# Patient Record
Sex: Female | Born: 1960 | Race: White | Hispanic: No | Marital: Married | Smoking: Former smoker
Health system: Southern US, Community
[De-identification: ages and names within clinical notes are randomized; demographics above are authoritative.]

## PROBLEM LIST (undated history)

## (undated) DIAGNOSIS — D509 Iron deficiency anemia, unspecified: Secondary | ICD-10-CM

## (undated) HISTORY — PX: CHOLECYSTECTOMY: SHX55

## (undated) HISTORY — PX: OTHER SURGICAL HISTORY: SHX169

## (undated) HISTORY — DX: Iron deficiency anemia, unspecified: D50.9

## (undated) HISTORY — PX: TOTAL SHOULDER ARTHROPLASTY: SHX126

---

## 2015-01-24 DIAGNOSIS — D509 Iron deficiency anemia, unspecified: Secondary | ICD-10-CM

## 2015-03-07 DIAGNOSIS — E042 Nontoxic multinodular goiter: Secondary | ICD-10-CM

## 2015-03-07 HISTORY — DX: Nontoxic multinodular goiter: E04.2

## 2015-05-02 DIAGNOSIS — E049 Nontoxic goiter, unspecified: Secondary | ICD-10-CM | POA: Insufficient documentation

## 2015-05-02 HISTORY — DX: Nontoxic goiter, unspecified: E04.9

## 2015-07-04 DIAGNOSIS — D509 Iron deficiency anemia, unspecified: Secondary | ICD-10-CM | POA: Diagnosis not present

## 2016-01-27 DIAGNOSIS — D509 Iron deficiency anemia, unspecified: Secondary | ICD-10-CM | POA: Diagnosis not present

## 2016-07-27 DIAGNOSIS — I10 Essential (primary) hypertension: Secondary | ICD-10-CM | POA: Diagnosis not present

## 2016-07-27 DIAGNOSIS — Z862 Personal history of diseases of the blood and blood-forming organs and certain disorders involving the immune mechanism: Secondary | ICD-10-CM | POA: Diagnosis not present

## 2018-01-04 ENCOUNTER — Encounter: Payer: Self-pay | Admitting: Gastroenterology

## 2019-04-26 DIAGNOSIS — D509 Iron deficiency anemia, unspecified: Secondary | ICD-10-CM

## 2019-05-08 DIAGNOSIS — E119 Type 2 diabetes mellitus without complications: Secondary | ICD-10-CM

## 2019-05-08 HISTORY — DX: Type 2 diabetes mellitus without complications: E11.9

## 2019-07-25 DIAGNOSIS — D509 Iron deficiency anemia, unspecified: Secondary | ICD-10-CM | POA: Diagnosis not present

## 2019-07-27 DIAGNOSIS — Z6828 Body mass index (BMI) 28.0-28.9, adult: Secondary | ICD-10-CM | POA: Diagnosis not present

## 2019-07-27 DIAGNOSIS — E559 Vitamin D deficiency, unspecified: Secondary | ICD-10-CM | POA: Diagnosis not present

## 2019-07-27 DIAGNOSIS — E039 Hypothyroidism, unspecified: Secondary | ICD-10-CM | POA: Diagnosis not present

## 2019-07-27 DIAGNOSIS — D509 Iron deficiency anemia, unspecified: Secondary | ICD-10-CM | POA: Diagnosis not present

## 2019-07-27 DIAGNOSIS — Z79899 Other long term (current) drug therapy: Secondary | ICD-10-CM | POA: Diagnosis not present

## 2019-07-27 DIAGNOSIS — E1169 Type 2 diabetes mellitus with other specified complication: Secondary | ICD-10-CM | POA: Diagnosis not present

## 2019-07-27 DIAGNOSIS — E785 Hyperlipidemia, unspecified: Secondary | ICD-10-CM | POA: Diagnosis not present

## 2019-08-21 DIAGNOSIS — Z79899 Other long term (current) drug therapy: Secondary | ICD-10-CM | POA: Diagnosis not present

## 2019-08-21 DIAGNOSIS — M25572 Pain in left ankle and joints of left foot: Secondary | ICD-10-CM | POA: Diagnosis not present

## 2019-08-21 DIAGNOSIS — D649 Anemia, unspecified: Secondary | ICD-10-CM | POA: Diagnosis not present

## 2019-08-21 DIAGNOSIS — M25571 Pain in right ankle and joints of right foot: Secondary | ICD-10-CM | POA: Diagnosis not present

## 2019-08-21 DIAGNOSIS — M0579 Rheumatoid arthritis with rheumatoid factor of multiple sites without organ or systems involvement: Secondary | ICD-10-CM | POA: Diagnosis not present

## 2019-09-14 DIAGNOSIS — M79672 Pain in left foot: Secondary | ICD-10-CM | POA: Diagnosis not present

## 2019-09-14 DIAGNOSIS — E119 Type 2 diabetes mellitus without complications: Secondary | ICD-10-CM | POA: Diagnosis not present

## 2019-09-14 DIAGNOSIS — M25571 Pain in right ankle and joints of right foot: Secondary | ICD-10-CM | POA: Diagnosis not present

## 2019-09-14 DIAGNOSIS — M25572 Pain in left ankle and joints of left foot: Secondary | ICD-10-CM | POA: Diagnosis not present

## 2019-09-14 DIAGNOSIS — M79641 Pain in right hand: Secondary | ICD-10-CM | POA: Diagnosis not present

## 2019-09-14 DIAGNOSIS — M79642 Pain in left hand: Secondary | ICD-10-CM | POA: Diagnosis not present

## 2019-09-14 DIAGNOSIS — M79671 Pain in right foot: Secondary | ICD-10-CM | POA: Diagnosis not present

## 2019-10-12 DIAGNOSIS — Z79899 Other long term (current) drug therapy: Secondary | ICD-10-CM | POA: Diagnosis not present

## 2019-10-12 DIAGNOSIS — M069 Rheumatoid arthritis, unspecified: Secondary | ICD-10-CM | POA: Diagnosis not present

## 2019-10-12 DIAGNOSIS — E785 Hyperlipidemia, unspecified: Secondary | ICD-10-CM | POA: Diagnosis not present

## 2019-10-23 DIAGNOSIS — M0579 Rheumatoid arthritis with rheumatoid factor of multiple sites without organ or systems involvement: Secondary | ICD-10-CM | POA: Diagnosis not present

## 2019-10-23 DIAGNOSIS — M25572 Pain in left ankle and joints of left foot: Secondary | ICD-10-CM | POA: Diagnosis not present

## 2019-10-23 DIAGNOSIS — Z79899 Other long term (current) drug therapy: Secondary | ICD-10-CM | POA: Diagnosis not present

## 2019-10-23 DIAGNOSIS — D649 Anemia, unspecified: Secondary | ICD-10-CM | POA: Diagnosis not present

## 2019-10-23 DIAGNOSIS — M25571 Pain in right ankle and joints of right foot: Secondary | ICD-10-CM | POA: Diagnosis not present

## 2019-10-23 DIAGNOSIS — M25752 Osteophyte, left hip: Secondary | ICD-10-CM | POA: Diagnosis not present

## 2019-10-28 DIAGNOSIS — F419 Anxiety disorder, unspecified: Secondary | ICD-10-CM

## 2019-10-28 DIAGNOSIS — F32A Depression, unspecified: Secondary | ICD-10-CM

## 2019-10-28 DIAGNOSIS — F418 Other specified anxiety disorders: Secondary | ICD-10-CM | POA: Insufficient documentation

## 2019-10-28 DIAGNOSIS — F329 Major depressive disorder, single episode, unspecified: Secondary | ICD-10-CM

## 2019-10-28 HISTORY — DX: Depression, unspecified: F32.A

## 2019-10-28 HISTORY — DX: Anxiety disorder, unspecified: F41.9

## 2019-10-28 HISTORY — DX: Major depressive disorder, single episode, unspecified: F32.9

## 2020-01-04 DIAGNOSIS — Z6827 Body mass index (BMI) 27.0-27.9, adult: Secondary | ICD-10-CM | POA: Diagnosis not present

## 2020-01-04 DIAGNOSIS — Z23 Encounter for immunization: Secondary | ICD-10-CM | POA: Diagnosis not present

## 2020-01-04 DIAGNOSIS — E785 Hyperlipidemia, unspecified: Secondary | ICD-10-CM | POA: Diagnosis not present

## 2020-01-04 DIAGNOSIS — E1169 Type 2 diabetes mellitus with other specified complication: Secondary | ICD-10-CM | POA: Diagnosis not present

## 2020-01-04 DIAGNOSIS — E039 Hypothyroidism, unspecified: Secondary | ICD-10-CM | POA: Diagnosis not present

## 2020-01-04 DIAGNOSIS — Z79899 Other long term (current) drug therapy: Secondary | ICD-10-CM | POA: Diagnosis not present

## 2020-01-04 DIAGNOSIS — F324 Major depressive disorder, single episode, in partial remission: Secondary | ICD-10-CM | POA: Diagnosis not present

## 2020-01-04 DIAGNOSIS — Z0000 Encounter for general adult medical examination without abnormal findings: Secondary | ICD-10-CM | POA: Diagnosis not present

## 2020-01-04 DIAGNOSIS — E669 Obesity, unspecified: Secondary | ICD-10-CM | POA: Diagnosis not present

## 2020-01-04 DIAGNOSIS — L4050 Arthropathic psoriasis, unspecified: Secondary | ICD-10-CM | POA: Diagnosis not present

## 2020-01-05 DIAGNOSIS — M0579 Rheumatoid arthritis with rheumatoid factor of multiple sites without organ or systems involvement: Secondary | ICD-10-CM | POA: Diagnosis not present

## 2020-01-05 DIAGNOSIS — M25572 Pain in left ankle and joints of left foot: Secondary | ICD-10-CM | POA: Diagnosis not present

## 2020-01-05 DIAGNOSIS — Z6826 Body mass index (BMI) 26.0-26.9, adult: Secondary | ICD-10-CM | POA: Diagnosis not present

## 2020-01-05 DIAGNOSIS — E663 Overweight: Secondary | ICD-10-CM | POA: Diagnosis not present

## 2020-01-05 DIAGNOSIS — M150 Primary generalized (osteo)arthritis: Secondary | ICD-10-CM | POA: Diagnosis not present

## 2020-01-24 ENCOUNTER — Other Ambulatory Visit: Payer: Self-pay | Admitting: Oncology

## 2020-01-24 ENCOUNTER — Other Ambulatory Visit: Payer: Self-pay | Admitting: Hematology and Oncology

## 2020-01-24 ENCOUNTER — Telehealth: Payer: Self-pay | Admitting: Hematology and Oncology

## 2020-01-24 DIAGNOSIS — D509 Iron deficiency anemia, unspecified: Secondary | ICD-10-CM | POA: Insufficient documentation

## 2020-01-24 HISTORY — DX: Iron deficiency anemia, unspecified: D50.9

## 2020-01-24 LAB — CBC AND DIFFERENTIAL
HCT: 29 — AB (ref 36–46)
Hemoglobin: 9.4 — AB (ref 12.0–16.0)
Platelets: 530 — AB (ref 150–399)
WBC: 13.1

## 2020-01-24 LAB — BASIC METABOLIC PANEL
BUN: 7 (ref 4–21)
CO2: 28 — AB (ref 13–22)
Chloride: 101 (ref 99–108)
Creatinine: 0.5 (ref 0.5–1.1)
Glucose: 183
Potassium: 3.3 — AB (ref 3.4–5.3)
Sodium: 141 (ref 137–147)

## 2020-01-24 LAB — IRON,TIBC AND FERRITIN PANEL
%SAT: 11.3
Ferritin: 32.5
Iron: 35
TIBC: 308

## 2020-01-24 LAB — COMPREHENSIVE METABOLIC PANEL: Calcium: 9.5 (ref 8.7–10.7)

## 2020-01-24 LAB — CBC: RBC: 3.25 — AB (ref 3.87–5.11)

## 2020-01-24 NOTE — Telephone Encounter (Signed)
Scheduled iron infusion per secure chat. Pt confirmed appt date and time.

## 2020-01-29 ENCOUNTER — Other Ambulatory Visit: Payer: Self-pay

## 2020-01-29 ENCOUNTER — Inpatient Hospital Stay: Payer: Medicare PPO | Attending: Oncology

## 2020-01-29 VITALS — BP 122/60 | HR 100 | Temp 98.2°F | Resp 18 | Ht 60.0 in | Wt 128.2 lb

## 2020-01-29 DIAGNOSIS — D509 Iron deficiency anemia, unspecified: Secondary | ICD-10-CM | POA: Diagnosis not present

## 2020-01-29 MED ORDER — SODIUM CHLORIDE 0.9 % IV SOLN
Freq: Once | INTRAVENOUS | Status: AC
  Filled 2020-01-29: qty 250

## 2020-01-29 MED ORDER — SODIUM CHLORIDE 0.9 % IV SOLN
510.0000 mg | Freq: Once | INTRAVENOUS | Status: AC
  Administered 2020-01-29: 510 mg via INTRAVENOUS
  Filled 2020-01-29: qty 510

## 2020-01-29 NOTE — Patient Instructions (Signed)

## 2020-01-29 NOTE — Progress Notes (Signed)
Pt stable at time of discharge. 

## 2020-02-02 ENCOUNTER — Other Ambulatory Visit: Payer: Self-pay

## 2020-02-02 ENCOUNTER — Inpatient Hospital Stay: Payer: Medicare PPO

## 2020-02-02 VITALS — BP 124/83 | HR 110 | Temp 98.4°F | Resp 18

## 2020-02-02 DIAGNOSIS — D509 Iron deficiency anemia, unspecified: Secondary | ICD-10-CM | POA: Diagnosis not present

## 2020-02-02 MED ORDER — SODIUM CHLORIDE 0.9 % IV SOLN
510.0000 mg | Freq: Once | INTRAVENOUS | Status: AC
  Administered 2020-02-02: 510 mg via INTRAVENOUS
  Filled 2020-02-02: qty 510

## 2020-02-02 MED ORDER — SODIUM CHLORIDE 0.9 % IV SOLN
Freq: Once | INTRAVENOUS | Status: AC
  Filled 2020-02-02: qty 250

## 2020-02-02 MED ORDER — SODIUM CHLORIDE 0.9% FLUSH
10.0000 mL | Freq: Once | INTRAVENOUS | Status: DC | PRN
  Filled 2020-02-02: qty 10

## 2020-02-02 NOTE — Patient Instructions (Signed)

## 2020-02-02 NOTE — Progress Notes (Signed)
PT STABLE AT TIME OF DISCHARGE 

## 2020-02-05 ENCOUNTER — Ambulatory Visit: Payer: Medicare PPO

## 2020-02-09 ENCOUNTER — Other Ambulatory Visit: Payer: Self-pay

## 2020-02-09 DIAGNOSIS — I313 Pericardial effusion (noninflammatory): Secondary | ICD-10-CM

## 2020-02-09 DIAGNOSIS — D509 Iron deficiency anemia, unspecified: Secondary | ICD-10-CM | POA: Insufficient documentation

## 2020-02-09 DIAGNOSIS — M818 Other osteoporosis without current pathological fracture: Secondary | ICD-10-CM

## 2020-02-09 DIAGNOSIS — E559 Vitamin D deficiency, unspecified: Secondary | ICD-10-CM

## 2020-02-09 DIAGNOSIS — E039 Hypothyroidism, unspecified: Secondary | ICD-10-CM | POA: Insufficient documentation

## 2020-02-09 DIAGNOSIS — L4050 Arthropathic psoriasis, unspecified: Secondary | ICD-10-CM | POA: Diagnosis not present

## 2020-02-09 DIAGNOSIS — F324 Major depressive disorder, single episode, in partial remission: Secondary | ICD-10-CM | POA: Diagnosis not present

## 2020-02-09 DIAGNOSIS — I3139 Other pericardial effusion (noninflammatory): Secondary | ICD-10-CM

## 2020-02-09 DIAGNOSIS — D649 Anemia, unspecified: Secondary | ICD-10-CM | POA: Insufficient documentation

## 2020-02-09 DIAGNOSIS — Z6825 Body mass index (BMI) 25.0-25.9, adult: Secondary | ICD-10-CM | POA: Diagnosis not present

## 2020-02-09 DIAGNOSIS — M6281 Muscle weakness (generalized): Secondary | ICD-10-CM | POA: Diagnosis not present

## 2020-02-09 DIAGNOSIS — R296 Repeated falls: Secondary | ICD-10-CM | POA: Diagnosis not present

## 2020-02-09 DIAGNOSIS — M059 Rheumatoid arthritis with rheumatoid factor, unspecified: Secondary | ICD-10-CM

## 2020-02-09 HISTORY — DX: Other pericardial effusion (noninflammatory): I31.39

## 2020-02-09 HISTORY — DX: Other osteoporosis without current pathological fracture: M81.8

## 2020-02-09 HISTORY — DX: Vitamin D deficiency, unspecified: E55.9

## 2020-02-09 HISTORY — DX: Pericardial effusion (noninflammatory): I31.3

## 2020-02-09 HISTORY — DX: Anemia, unspecified: D64.9

## 2020-02-09 HISTORY — DX: Rheumatoid arthritis with rheumatoid factor, unspecified: M05.9

## 2020-02-09 HISTORY — DX: Hypothyroidism, unspecified: E03.9

## 2020-02-12 ENCOUNTER — Encounter: Payer: Self-pay | Admitting: Cardiology

## 2020-02-12 ENCOUNTER — Ambulatory Visit: Payer: Medicare PPO | Admitting: Cardiology

## 2020-02-12 ENCOUNTER — Other Ambulatory Visit: Payer: Self-pay

## 2020-02-12 ENCOUNTER — Ambulatory Visit (INDEPENDENT_AMBULATORY_CARE_PROVIDER_SITE_OTHER): Payer: Medicare PPO

## 2020-02-12 VITALS — BP 110/75 | HR 100 | Ht 60.0 in | Wt 125.0 lb

## 2020-02-12 DIAGNOSIS — I10 Essential (primary) hypertension: Secondary | ICD-10-CM | POA: Diagnosis not present

## 2020-02-12 DIAGNOSIS — I38 Endocarditis, valve unspecified: Secondary | ICD-10-CM

## 2020-02-12 DIAGNOSIS — I491 Atrial premature depolarization: Secondary | ICD-10-CM

## 2020-02-12 DIAGNOSIS — R42 Dizziness and giddiness: Secondary | ICD-10-CM

## 2020-02-12 DIAGNOSIS — E1100 Type 2 diabetes mellitus with hyperosmolarity without nonketotic hyperglycemic-hyperosmolar coma (NKHHC): Secondary | ICD-10-CM

## 2020-02-12 DIAGNOSIS — M059 Rheumatoid arthritis with rheumatoid factor, unspecified: Secondary | ICD-10-CM | POA: Diagnosis not present

## 2020-02-12 DIAGNOSIS — I447 Left bundle-branch block, unspecified: Secondary | ICD-10-CM | POA: Diagnosis not present

## 2020-02-12 DIAGNOSIS — R0600 Dyspnea, unspecified: Secondary | ICD-10-CM

## 2020-02-12 DIAGNOSIS — R0609 Other forms of dyspnea: Secondary | ICD-10-CM

## 2020-02-12 NOTE — Patient Instructions (Signed)
Medication Instructions:  No medication changes. *If you need a refill on your cardiac medications before your next appointment, please call your pharmacy*   Lab Work: None ordered If you have labs (blood work) drawn today and your tests are completely normal, you will receive your results only by: Marland Kitchen MyChart Message (if you have MyChart) OR . A paper copy in the mail If you have any lab test that is abnormal or we need to change your treatment, we will call you to review the results.   Testing/Procedures: Your physician has requested that you have an echocardiogram. Echocardiography is a painless test that uses sound waves to create images of your heart. It provides your doctor with information about the size and shape of your heart and how well your heart's chambers and valves are working. This procedure takes approximately one hour. There are no restrictions for this procedure.   WHY IS MY DOCTOR PRESCRIBING ZIO? The Zio system is proven and trusted by physicians to detect and diagnose irregular heart rhythms -- and has been prescribed to hundreds of thousands of patients.  The FDA has cleared the Zio system to monitor for many different kinds of irregular heart rhythms. In a study, physicians were able to reach a diagnosis 90% of the time with the Zio system1.  You can wear the Zio monitor -- a small, discreet, comfortable patch -- during your normal day-to-day activity, including while you sleep, shower, and exercise, while it records every single heartbeat for analysis.  1Barrett, P., et al. Comparison of 24 Hour Holter Monitoring Versus 14 Day Novel Adhesive Patch Electrocardiographic Monitoring. American Journal of Medicine, 2014.  ZIO VS. HOLTER MONITORING The Zio monitor can be comfortably worn for up to 14 days. Holter monitors can be worn for 24 to 48 hours, limiting the time to record any irregular heart rhythms you may have. Zio is able to capture data for the 51% of patients  who have their first symptom-triggered arrhythmia after 48 hours.1  LIVE WITHOUT RESTRICTIONS The Zio ambulatory cardiac monitor is a small, unobtrusive, and water-resistant patch--you might even forget you're wearing it. The Zio monitor records and stores every beat of your heart, whether you're sleeping, working out, or showering.  Wear the monitor for 1 week, remove 02/19/20.   Follow-Up: At [facility], you and your health needs are our priority.  As part of our continuing mission to provide you with exceptional heart care, we have created designated Provider Care Teams.  These Care Teams include your primary Cardiologist (physician) and Advanced Practice Providers (APPs -  Physician Assistants and Nurse Practitioners) who all work together to provide you with the care you need, when you need it.  We recommend signing up for the patient portal called "MyChart".  Sign up information is provided on this After Visit Summary.  MyChart is used to connect with patients for Virtual Visits (Telemedicine).  Patients are able to view lab/test results, encounter notes, upcoming appointments, etc.  Non-urgent messages can be sent to your provider as well.   To learn more about what you can do with MyChart, go to [email or web address].    Your next appointment:   8 week(s)  The format for your next appointment:   In Person  Provider:   Thomasene Ripple, DO   Other Instructions  Echocardiogram An echocardiogram is a procedure that uses painless sound waves (ultrasound) to produce an image of the heart. Images from an echocardiogram can provide important information about:  Signs of  coronary artery disease (CAD).  Aneurysm detection. An aneurysm is a weak or damaged part of an artery wall that bulges out from the normal force of blood pumping through the body.  Heart size and shape. Changes in the size or shape of the heart can be associated with certain conditions, including heart failure,  aneurysm, and CAD.  Heart muscle function.  Heart valve function.  Signs of a past heart attack.  Fluid buildup around the heart.  Thickening of the heart muscle.  A tumor or infectious growth around the heart valves. Tell a health care provider about:  Any allergies you have.  All medicines you are taking, including vitamins, herbs, eye drops, creams, and over-the-counter medicines.  Any blood disorders you have.  Any surgeries you have had.  Any medical conditions you have.  Whether you are pregnant or may be pregnant. What are the risks? Generally, this is a safe procedure. However, problems may occur, including:  Allergic reaction to dye (contrast) that may be used during the procedure. What happens before the procedure? No specific preparation is needed. You may eat and drink normally. What happens during the procedure?   An IV tube may be inserted into one of your veins.  You may receive contrast through this tube. A contrast is an injection that improves the quality of the pictures from your heart.  A gel will be applied to your chest.  A wand-like tool (transducer) will be moved over your chest. The gel will help to transmit the sound waves from the transducer.  The sound waves will harmlessly bounce off of your heart to allow the heart images to be captured in real-time motion. The images will be recorded on a computer. The procedure may vary among health care providers and hospitals. What happens after the procedure?  You may return to your normal, everyday life, including diet, activities, and medicines, unless your health care provider tells you not to do that. Summary  An echocardiogram is a procedure that uses painless sound waves (ultrasound) to produce an image of the heart.  Images from an echocardiogram can provide important information about the size and shape of your heart, heart muscle function, heart valve function, and fluid buildup around  your heart.  You do not need to do anything to prepare before this procedure. You may eat and drink normally.  After the echocardiogram is completed, you may return to your normal, everyday life, unless your health care provider tells you not to do that. This information is not intended to replace advice given to you by your health care provider. Make sure you discuss any questions you have with your health care provider. Document Revised: 07/07/2018 Document Reviewed: 04/18/2016 Elsevier Patient Education  [place].

## 2020-02-12 NOTE — Progress Notes (Signed)
Cardiology Office Note:    Date:  02/12/2020   ID:  Madison Hurst, DOB May 12, 1960, MRN [id number]  PCP:  Buckner Malta, MD  Cardiologist:  Thomasene Ripple, DO  Electrophysiologist:  None   Referring MD: Buckner Malta, MD   " I was asked see you by my pcp."  History of Present Illness:    Madison Hurst is a 59 y.o. female with a hx of hypertension, rheumatoid arthritis, iron deficiency anemia receiving iron therapy presents today to be evaluated at the request of her PCP. During our encounter the patient tells me that she has had some shortness of breath on exertion.  She notes that she avoid activity given the fact that she gets short of breath.  What most bothersome for the patient recently is the fact she has been experiencing lightheadedness and dizziness over the last several weeks.  She explained this as an abrupt onset of dizziness which occur at any time does not matter if she is standing or sitting.  It short-lived and then resolved itself.   Past Medical History:  Diagnosis Date  . Anemia 02/09/2020  . Anxiety 10/28/2019  . Depressive disorder 10/28/2019  . Hypothyroidism 02/09/2020  . Iron deficiency anemia 01/24/2020  . Iron deficiency anemia, unspecified   . Multinodular goiter 03/07/2015  . Pericardial effusion 02/09/2020  . 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 Type 2 diabetes mellitus (HCC) 05/08/2019  . Vitamin D deficiency 02/09/2020    Past Surgical History:  Procedure Laterality Date  . CESAREAN SECTION    . CHOLECYSTECTOMY    . other     Growth removal on chest  . TOTAL SHOULDER ARTHROPLASTY Left     Current Medications: Current Meds  Medication Sig  . Abatacept (ORENCIA [place]) Inject into the skin once a week.  . ARIPiprazole (ABILIFY) 10 MG tablet Take 10 mg by mouth daily.  Marland Kitchen buPROPion (WELLBUTRIN XL) 150 MG 24 hr tablet  Take 150 mg by mouth daily.  . ergocalciferol (VITAMIN D2) 1.25 MG (50000 UT) capsule Take 50,000 Units by mouth once a week.  . flurbiprofen (ANSAID) 100 MG tablet Take 100 mg by mouth 2 (two) times daily.  . folic acid (FOLVITE) 1 MG tablet Take 2 mg by mouth daily.  Marland Kitchen HYDROcodone-acetaminophen (NORCO/VICODIN) 5-325 MG tablet Take 1 tablet by mouth as needed.  Marland Kitchen levothyroxine (SYNTHROID) 75 MCG tablet Take 75 mcg by mouth daily before breakfast.  . metFORMIN (GLUMETZA) 500 MG (MOD) 24 hr tablet Take 500 mg by mouth daily with breakfast.  . Multiple Vitamin (MULTIVITAMIN) capsule Take 1 capsule by mouth daily.  Marland Kitchen ORENCIA CLICKJECT 125 MG/ML SOAJ   . predniSONE (DELTASONE) 10 MG tablet Take 10 mg by mouth daily with breakfast.  . telmisartan (MICARDIS) 20 MG tablet Take 20 mg by mouth daily.     Allergies:   Sulfa antibiotics and Sulfamethoxazole-trimethoprim   Social History   Socioeconomic History  . Marital status: Married    Spouse name: Not on file  . Number of children: Not on file  . Years of education: Not on file  . Highest education level: Not on file  Occupational History  . Not on file  Tobacco Use  . Smoking status: Former Smoker    Quit date: 02/11/1994    Years since quitting: 26.0  . Smokeless tobacco: Never Used  Vaping Use  . Vaping Use: Never used  Substance  and Sexual Activity  . Alcohol use: Not on file  . Drug use: Not on file  . Sexual activity: Not on file  Other Topics Concern  . Not on file  Social History Narrative  . Not on file   Social Determinants of Health   Financial Resource Strain:   . Difficulty of Paying Living Expenses: Not on file  Food Insecurity:   . Worried About Programme researcher, broadcasting/film/video in the Last Year: Not on file  . Ran Out of Food in the Last Year: Not on file  Transportation Needs:   . Lack of Transportation (Medical): Not on file  . Lack of Transportation (Non-Medical): Not on file  Physical Activity:   . Days of Exercise  per Week: Not on file  . Minutes of Exercise per Session: Not on file  Stress:   . Feeling of Stress : Not on file  Social Connections:   . Frequency of Communication with Friends and Family: Not on file  . Frequency of Social Gatherings with Friends and Family: Not on file  . Attends Religious Services: Not on file  . Active Member of Clubs or Organizations: Not on file  . Attends Banker Meetings: Not on file  . Marital Status: Not on file     Family History: The patient's family history includes Diabetes in her brother and father; Heart disease in her brother; Muscular dystrophy in her mother.  ROS:   Review of Systems  Constitution: Negative for decreased appetite, fever and weight gain.  HENT: Negative for congestion, ear discharge, hoarse voice and sore throat.   Eyes: Negative for discharge, redness, vision loss in right eye and visual halos.  Cardiovascular: Negative for chest pain, dyspnea on exertion, leg swelling, orthopnea and palpitations.  Respiratory: Negative for cough, hemoptysis, shortness of breath and snoring.   Endocrine: Negative for heat intolerance and polyphagia.  Hematologic/Lymphatic: Negative for bleeding problem. Does not bruise/bleed easily.  Skin: Negative for flushing, nail changes, rash and suspicious lesions.  Musculoskeletal: Negative for arthritis, joint pain, muscle cramps, myalgias, neck pain and stiffness.  Gastrointestinal: Negative for abdominal pain, bowel incontinence, diarrhea and excessive appetite.  Genitourinary: Negative for decreased libido, genital sores and incomplete emptying.  Neurological: Negative for brief paralysis, focal weakness, headaches and loss of balance.  Psychiatric/Behavioral: Negative for altered mental status, depression and suicidal ideas.  Allergic/Immunologic: Negative for HIV exposure and persistent infections.    EKGs/Labs/Other Studies Reviewed:    The following studies were reviewed  today:   EKG:  The ekg ordered today demonstrates sinus rhythm, heart rate 96 bpm with occasional PACs and left bundle branch block.  Recent Labs: 01/24/2020: BUN 7; Creatinine 0.5; Hemoglobin 9.4; Platelets 530; Potassium 3.3; Sodium 141  Recent Lipid Panel No results found for: CHOL, TRIG, HDL, CHOLHDL, VLDL, LDLCALC, LDLDIRECT  Physical Exam:    VS:  BP 110/75   Pulse 100   Ht 5' (1.524 m)   Wt 125 lb (56.7 kg)   SpO2 99%   BMI 24.41 kg/m     Wt Readings from Last 3 Encounters:  02/12/20 125 lb (56.7 kg)  01/30/20 131 lb 14.4 oz (59.8 kg)  01/29/20 128 lb 4 oz (58.2 kg)     GEN: Well nourished, well developed in no acute distress HEENT: Normal NECK: No JVD; No carotid bruits LYMPHATICS: No lymphadenopathy CARDIAC: S1S2 noted,RRR, 2 out of 6 mid ejection systolic murmurs, rubs, gallops RESPIRATORY:  Clear to auscultation without rales, wheezing or  rhonchi  ABDOMEN: Soft, non-tender, non-distended, +bowel sounds, no guarding. EXTREMITIES: No edema, No cyanosis, no clubbing MUSCULOSKELETAL:  No deformity  SKIN: Warm and dry NEUROLOGIC:  Alert and oriented x 3, non-focal PSYCHIATRIC:  Normal affect, good insight  ASSESSMENT:    1. Murmur, diastolic   2. Dizziness   3. Primary hypertension   4. Type 2 diabetes mellitus with hyperosmolarity without coma, without long-term current use of insulin (HCC)   5. Seropositive rheumatoid arthritis (HCC)   6. Dyspnea on exertion   7. Left bundle branch block   8. PAC (premature atrial contraction)    PLAN:     I would like to rule out a cardiovascular etiology of this dizziness, therefore at this time I would like to placed a zio patch for 7 days.  For her shortness of breath and murmur,a transthoracic echocardiogram will be ordered to assess LV/RV function and any structural abnormalities. Once these testing have been performed amd reviewed further reccomendations will be made.   She also does have risk factors for  coronary artery disease but will wait until her echocardiogram results to see how we can pursue an ischemic evaluation.  Diabetes mellitus is being managed by her primary care doctor.   For now, I do reccomend that the patient goes to the nearest ED if  symptoms recur.  The patient is in agreement with the above plan. The patient left the office in stable condition.  The patient will follow up in   Medication Adjustments/Labs and Tests Ordered: Current medicines are reviewed at length with the patient today.  Concerns regarding medicines are outlined above.  Orders Placed This Encounter  Procedures  . LONG TERM MONITOR (3-14 DAYS)  . EKG 12-Lead  . ECHOCARDIOGRAM COMPLETE   No orders of the defined types were placed in this encounter.   Patient Instructions  Medication Instructions:  No medication changes. *If you need a refill on your cardiac medications before your next appointment, please call your pharmacy*   Lab Work: None ordered If you have labs (blood work) drawn today and your tests are completely normal, you will receive your results only by: Marland Kitchen MyChart Message (if you have MyChart) OR . A paper copy in the mail If you have any lab test that is abnormal or we need to change your treatment, we will call you to review the results.   Testing/Procedures: Your physician has requested that you have an echocardiogram. Echocardiography is a painless test that uses sound waves to create images of your heart. It provides your doctor with information about the size and shape of your heart and how well your heart's chambers and valves are working. This procedure takes approximately one hour. There are no restrictions for this procedure.   WHY IS MY DOCTOR PRESCRIBING ZIO? The Zio system is proven and trusted by physicians to detect and diagnose irregular heart rhythms -- and has been prescribed to hundreds of thousands of patients.  The FDA has cleared the Zio system to monitor  for many different kinds of irregular heart rhythms. In a study, physicians were able to reach a diagnosis 90% of the time with the Zio system1.  You can wear the Zio monitor -- a small, discreet, comfortable patch -- during your normal day-to-day activity, including while you sleep, shower, and exercise, while it records every single heartbeat for analysis.  1Barrett, P., et al. Comparison of 24 Hour Holter Monitoring Versus 14 Day Novel Adhesive Patch Electrocardiographic Monitoring. American Journal of  Medicine, 2014.  ZIO VS. HOLTER MONITORING The Zio monitor can be comfortably worn for up to 14 days. Holter monitors can be worn for 24 to 48 hours, limiting the time to record any irregular heart rhythms you may have. Zio is able to capture data for the 51% of patients who have their first symptom-triggered arrhythmia after 48 hours.1  LIVE WITHOUT RESTRICTIONS The Zio ambulatory cardiac monitor is a small, unobtrusive, and water-resistant patch--you might even forget you're wearing it. The Zio monitor records and stores every beat of your heart, whether you're sleeping, working out, or showering.  Wear the monitor for 1 week, remove 02/19/20.   Follow-Up: At [facility], you and your health needs are our priority.  As part of our continuing mission to provide you with exceptional heart care, we have created designated Provider Care Teams.  These Care Teams include your primary Cardiologist (physician) and Advanced Practice Providers (APPs -  Physician Assistants and Nurse Practitioners) who all work together to provide you with the care you need, when you need it.  We recommend signing up for the patient portal called "MyChart".  Sign up information is provided on this After Visit Summary.  MyChart is used to connect with patients for Virtual Visits (Telemedicine).  Patients are able to view lab/test results, encounter notes, upcoming appointments, etc.  Non-urgent messages can be sent to  your provider as well.   To learn more about what you can do with MyChart, go to [email or web address].    Your next appointment:   8 week(s)  The format for your next appointment:   In Person  Provider:   Thomasene Ripple, DO   Other Instructions  Echocardiogram An echocardiogram is a procedure that uses painless sound waves (ultrasound) to produce an image of the heart. Images from an echocardiogram can provide important information about:  Signs of coronary artery disease (CAD).  Aneurysm detection. An aneurysm is a weak or damaged part of an artery wall that bulges out from the normal force of blood pumping through the body.  Heart size and shape. Changes in the size or shape of the heart can be associated with certain conditions, including heart failure, aneurysm, and CAD.  Heart muscle function.  Heart valve function.  Signs of a past heart attack.  Fluid buildup around the heart.  Thickening of the heart muscle.  A tumor or infectious growth around the heart valves. Tell a health care provider about:  Any allergies you have.  All medicines you are taking, including vitamins, herbs, eye drops, creams, and over-the-counter medicines.  Any blood disorders you have.  Any surgeries you have had.  Any medical conditions you have.  Whether you are pregnant or may be pregnant. What are the risks? Generally, this is a safe procedure. However, problems may occur, including:  Allergic reaction to dye (contrast) that may be used during the procedure. What happens before the procedure? No specific preparation is needed. You may eat and drink normally. What happens during the procedure?   An IV tube may be inserted into one of your veins.  You may receive contrast through this tube. A contrast is an injection that improves the quality of the pictures from your heart.  A gel will be applied to your chest.  A wand-like tool (transducer) will be moved over your  chest. The gel will help to transmit the sound waves from the transducer.  The sound waves will harmlessly bounce off of your heart to allow the  heart images to be captured in real-time motion. The images will be recorded on a computer. The procedure may vary among health care providers and hospitals. What happens after the procedure?  You may return to your normal, everyday life, including diet, activities, and medicines, unless your health care provider tells you not to do that. Summary  An echocardiogram is a procedure that uses painless sound waves (ultrasound) to produce an image of the heart.  Images from an echocardiogram can provide important information about the size and shape of your heart, heart muscle function, heart valve function, and fluid buildup around your heart.  You do not need to do anything to prepare before this procedure. You may eat and drink normally.  After the echocardiogram is completed, you may return to your normal, everyday life, unless your health care provider tells you not to do that. This information is not intended to replace advice given to you by your health care provider. Make sure you discuss any questions you have with your health care provider. Document Revised: 07/07/2018 Document Reviewed: 04/18/2016 Elsevier Patient Education  2020 [employer].      Adopting a Healthy Lifestyle.  Know what a healthy weight is for you (roughly BMI <25) and aim to maintain this   Aim for 7+ servings of fruits and vegetables daily   65-80+ fluid ounces of water or unsweet tea for healthy kidneys   Limit to max 1 drink of alcohol per day; avoid smoking/tobacco   Limit animal fats in diet for cholesterol and heart health - choose grass fed whenever available   Avoid highly processed foods, and foods high in saturated/trans fats   Aim for low stress - take time to unwind and care for your mental health   Aim for 150 min of moderate intensity exercise  weekly for heart health, and weights twice weekly for bone health   Aim for 7-9 hours of sleep daily   When it comes to diets, agreement about the perfect plan isnt easy to find, even among the experts. Experts at the [place] [employer] developed an idea known as the Healthy Eating Plate. Just imagine a plate divided into logical, healthy portions.   The emphasis is on diet quality:   Load up on vegetables and fruits - one-half of your plate: Aim for color and variety, and remember that potatoes dont count.   Go for whole grains - one-quarter of your plate: Whole wheat, barley, wheat berries, quinoa, oats, brown rice, and foods made with them. If you want pasta, go with whole wheat pasta.   Protein power - one-quarter of your plate: Fish, chicken, beans, and nuts are all healthy, versatile protein sources. Limit red meat.   The diet, however, does go beyond the plate, offering a few other suggestions.   Use healthy plant oils, such as olive, canola, soy, corn, sunflower and peanut. Check the labels, and avoid partially hydrogenated oil, which have unhealthy trans fats.   If youre thirsty, drink water. Coffee and tea are good in moderation, but skip sugary drinks and limit milk and dairy products to one or two daily servings.   The type of carbohydrate in the diet is more important than the amount. Some sources of carbohydrates, such as vegetables, fruits, whole grains, and beans-are healthier than others.   Finally, stay active  Signed, Thomasene Ripple, DO  02/12/2020 11:12 AM    [facility]

## 2020-02-16 DIAGNOSIS — G9389 Other specified disorders of brain: Secondary | ICD-10-CM | POA: Diagnosis not present

## 2020-02-16 DIAGNOSIS — I6529 Occlusion and stenosis of unspecified carotid artery: Secondary | ICD-10-CM | POA: Diagnosis not present

## 2020-02-16 DIAGNOSIS — R296 Repeated falls: Secondary | ICD-10-CM | POA: Diagnosis not present

## 2020-02-16 DIAGNOSIS — D649 Anemia, unspecified: Secondary | ICD-10-CM | POA: Diagnosis not present

## 2020-02-21 DIAGNOSIS — M545 Low back pain, unspecified: Secondary | ICD-10-CM | POA: Diagnosis not present

## 2020-02-21 DIAGNOSIS — M4804 Spinal stenosis, thoracic region: Secondary | ICD-10-CM | POA: Diagnosis not present

## 2020-02-21 DIAGNOSIS — M5124 Other intervertebral disc displacement, thoracic region: Secondary | ICD-10-CM | POA: Diagnosis not present

## 2020-02-21 DIAGNOSIS — M0579 Rheumatoid arthritis with rheumatoid factor of multiple sites without organ or systems involvement: Secondary | ICD-10-CM | POA: Diagnosis not present

## 2020-02-21 DIAGNOSIS — M6281 Muscle weakness (generalized): Secondary | ICD-10-CM | POA: Diagnosis not present

## 2020-02-26 DIAGNOSIS — M48061 Spinal stenosis, lumbar region without neurogenic claudication: Secondary | ICD-10-CM | POA: Diagnosis not present

## 2020-02-26 DIAGNOSIS — M5124 Other intervertebral disc displacement, thoracic region: Secondary | ICD-10-CM | POA: Diagnosis not present

## 2020-02-26 DIAGNOSIS — M4804 Spinal stenosis, thoracic region: Secondary | ICD-10-CM | POA: Diagnosis not present

## 2020-02-26 DIAGNOSIS — Z6825 Body mass index (BMI) 25.0-25.9, adult: Secondary | ICD-10-CM | POA: Diagnosis not present

## 2020-02-28 DIAGNOSIS — G319 Degenerative disease of nervous system, unspecified: Secondary | ICD-10-CM | POA: Diagnosis not present

## 2020-02-28 DIAGNOSIS — R296 Repeated falls: Secondary | ICD-10-CM | POA: Diagnosis not present

## 2020-02-28 DIAGNOSIS — H748X3 Other specified disorders of middle ear and mastoid, bilateral: Secondary | ICD-10-CM | POA: Diagnosis not present

## 2020-02-28 DIAGNOSIS — R9082 White matter disease, unspecified: Secondary | ICD-10-CM | POA: Diagnosis not present

## 2020-03-07 DIAGNOSIS — R42 Dizziness and giddiness: Secondary | ICD-10-CM | POA: Diagnosis not present

## 2020-03-08 ENCOUNTER — Telehealth: Payer: Self-pay

## 2020-03-08 NOTE — Telephone Encounter (Signed)
Left message on patients voicemail to please return our call.   

## 2020-03-08 NOTE — Telephone Encounter (Signed)
Spoke with patient regarding results and recommendation.  Patient verbalizes understanding and is agreeable to plan of care. Advised patient to call back with any issues or concerns.  

## 2020-03-08 NOTE — Telephone Encounter (Signed)
-----   Message from Madison Ripple, DO sent at 03/08/2020  4:16 PM EST ----- Have the patient see me next week so we can discuss her monitor results.

## 2020-03-11 ENCOUNTER — Ambulatory Visit (INDEPENDENT_AMBULATORY_CARE_PROVIDER_SITE_OTHER): Payer: Medicare PPO

## 2020-03-11 ENCOUNTER — Other Ambulatory Visit: Payer: Self-pay

## 2020-03-11 DIAGNOSIS — I38 Endocarditis, valve unspecified: Secondary | ICD-10-CM

## 2020-03-11 LAB — ECHOCARDIOGRAM COMPLETE
Area-P 1/2: 6.54 cm2
Calc EF: 37.2 %
S' Lateral: 3.5 cm
Single Plane A2C EF: 37.8 %
Single Plane A4C EF: 37.4 %

## 2020-03-11 NOTE — Progress Notes (Signed)
Complete echocardiogram performed.  Jimmy Virna Livengood RDCS, RVT  

## 2020-03-12 ENCOUNTER — Other Ambulatory Visit: Payer: Self-pay

## 2020-03-13 ENCOUNTER — Ambulatory Visit: Payer: Medicare PPO | Admitting: Cardiology

## 2020-03-13 ENCOUNTER — Other Ambulatory Visit: Payer: Self-pay

## 2020-03-13 ENCOUNTER — Encounter: Payer: Self-pay | Admitting: Cardiology

## 2020-03-13 VITALS — BP 118/68 | HR 88 | Ht 60.0 in | Wt 123.2 lb

## 2020-03-13 DIAGNOSIS — R0600 Dyspnea, unspecified: Secondary | ICD-10-CM

## 2020-03-13 DIAGNOSIS — I471 Supraventricular tachycardia: Secondary | ICD-10-CM | POA: Diagnosis not present

## 2020-03-13 DIAGNOSIS — I4719 Other supraventricular tachycardia: Secondary | ICD-10-CM

## 2020-03-13 DIAGNOSIS — E8881 Metabolic syndrome: Secondary | ICD-10-CM | POA: Diagnosis not present

## 2020-03-13 DIAGNOSIS — R931 Abnormal findings on diagnostic imaging of heart and coronary circulation: Secondary | ICD-10-CM

## 2020-03-13 DIAGNOSIS — R0609 Other forms of dyspnea: Secondary | ICD-10-CM

## 2020-03-13 DIAGNOSIS — I491 Atrial premature depolarization: Secondary | ICD-10-CM

## 2020-03-13 DIAGNOSIS — R0989 Other specified symptoms and signs involving the circulatory and respiratory systems: Secondary | ICD-10-CM | POA: Diagnosis not present

## 2020-03-13 HISTORY — DX: Supraventricular tachycardia: I47.1

## 2020-03-13 HISTORY — DX: Dyspnea, unspecified: R06.00

## 2020-03-13 HISTORY — DX: Other supraventricular tachycardia: I47.19

## 2020-03-13 HISTORY — DX: Atrial premature depolarization: I49.1

## 2020-03-13 HISTORY — DX: Metabolic syndrome: E88.810

## 2020-03-13 HISTORY — DX: Other forms of dyspnea: R06.09

## 2020-03-13 HISTORY — DX: Metabolic syndrome: E88.81

## 2020-03-13 HISTORY — DX: Abnormal findings on diagnostic imaging of heart and coronary circulation: R93.1

## 2020-03-13 HISTORY — DX: Other specified symptoms and signs involving the circulatory and respiratory systems: R09.89

## 2020-03-13 MED ORDER — METOPROLOL SUCCINATE ER 25 MG PO TB24: 12.5000 mg | ORAL_TABLET | Freq: Every day | ORAL | 3 refills | Status: DC

## 2020-03-13 NOTE — Patient Instructions (Signed)
Medication Instructions:  Your physician has recommended you make the following change in your medication:   Start Torpol XL 12.5 mg daily.  *If you need a refill on your cardiac medications before your next appointment, please call your pharmacy*   Lab Work: Your physician recommends that you have a Vitamin D level done today.  Your physician recommends that you return for lab work in: 1 week prior to your CT. You can come Monday through Friday 8:30 am to 12:00 pm and 1:15 to 4:30. You do not need to make an appointment as the order has already been placed.   If you have labs (blood work) drawn today and your tests are completely normal, you will receive your results only by: Marland Kitchen MyChart Message (if you have MyChart) OR . A paper copy in the mail If you have any lab test that is abnormal or we need to change your treatment, we will call you to review the results.   Testing/Procedures: Your cardiac CT will be scheduled at:   [facility] [place], [place] [address] [phone]   If scheduled at [facility], please arrive at the [facility] main entrance of [facility] 30 minutes prior to test start time. Proceed to the [facility] Radiology Department (first floor) to check-in and test prep.  Please follow these instructions carefully (unless otherwise directed):  On the Night Before the Test: . Be sure to Drink plenty of water. . Do not consume any caffeinated/decaffeinated beverages or chocolate 12 hours prior to your test. . Do not take any antihistamines 12 hours prior to your test.   On the Day of the Test: . Drink plenty of water. Do not drink any water within one hour of the test. . Do not eat any food 4 hours prior to the test. . You may take your regular medications prior to the test.  . Take metoprolol (ToprolXL)two hours prior to test. . FEMALES- please wear underwire-free bra if available  After the Test: . Drink  plenty of water. . After receiving IV contrast, you may experience a mild flushed feeling. This is normal. . On occasion, you may experience a mild rash up to 24 hours after the test. This is not dangerous. If this occurs, you can take Benadryl 25 mg and increase your fluid intake. . If you experience trouble breathing, this can be serious. If it is severe call 911 IMMEDIATELY. If it is mild, please call our office. . If you take any of these medications: Glipizide/Metformin, Avandament, Glucavance, please do not take 48 hours after completing test unless otherwise instructed.   Once we have confirmed authorization from your insurance company, we will call you to set up a date and time for your test. Based on how quickly your insurance processes prior authorizations requests, please allow up to 4 weeks to be contacted for scheduling your Cardiac CT appointment. Be advised that routine Cardiac CT appointments could be scheduled as many as 8 weeks after your provider has ordered it.  For non-scheduling related questions, please contact the cardiac imaging nurse navigator should you have any questions/concerns: Marchia Bond, Cardiac Imaging Nurse Navigator Burley Saver, Interim Cardiac Imaging Nurse [facility] Direct Office Dial: [phone]   For scheduling needs, including cancellations and rescheduling, please call Vivien Rota at [phone].     Follow-Up: At [facility], you and your health needs are our priority.  As part of our continuing  mission to provide you with exceptional heart care, we have created designated Provider Care Teams.  These Care Teams include your primary Cardiologist (physician) and Advanced Practice Providers (APPs -  Physician Assistants and Nurse Practitioners) who all work together to provide you with the care you need, when you need it.  We recommend signing up for the patient portal called "MyChart".  Sign up information is provided  on this After Visit Summary.  MyChart is used to connect with patients for Virtual Visits (Telemedicine).  Patients are able to view lab/test results, encounter notes, upcoming appointments, etc.  Non-urgent messages can be sent to your provider as well.   To learn more about what you can do with MyChart, go to [email or web address].    Your next appointment:   3 month(s)  The format for your next appointment:   In Person  Provider:   Berniece Salines, DO   Other Instructions Metoprolol Extended-Release Tablets What is this medicine? METOPROLOL (me TOE proe lole) is a beta blocker. It decreases the amount of work your heart has to do and helps your heart beat regularly. It treats high blood pressure and/or prevent chest pain (also called angina). It also treats heart failure. This medicine may be used for other purposes; ask your health care provider or pharmacist if you have questions. COMMON BRAND NAME(S): toprol, Toprol XL What should I tell my health care provider before I take this medicine? They need to know if you have any of these conditions:  diabetes  heart or vessel disease like slow heart rate, worsening heart failure, heart block, sick sinus syndrome or Raynaud's disease  kidney disease  liver disease  lung or breathing disease, like asthma or emphysema  pheochromocytoma  thyroid disease  an unusual or allergic reaction to metoprolol, other beta-blockers, medicines, foods, dyes, or preservatives  pregnant or trying to get pregnant  breast-feeding How should I use this medicine? Take this drug by mouth. Take it as directed on the prescription label at the same time every day. Take it with food. You may cut the tablet in half if it is scored (has a line in the middle of it). This may help you swallow the tablet if the whole tablet is too big. Be sure to take both halves. Do not take just one-half of the tablet. Keep taking it unless your health care provider tells  you to stop. Talk to your health care provider about the use of this drug in children. While it may be prescribed for children as young as 6 for selected conditions, precautions do apply. Overdosage: If you think you have taken too much of this medicine contact a poison control center or emergency room at once. NOTE: This medicine is only for you. Do not share this medicine with others. What if I miss a dose? If you miss a dose, take it as soon as you can. If it is almost time for your next dose, take only that dose. Do not take double or extra doses. What may interact with this medicine? This medicine may interact with the following medications:  certain medicines for blood pressure, heart disease, irregular heart beat  certain medicines for depression, like monoamine oxidase (MAO) inhibitors, fluoxetine, or paroxetine  clonidine  dobutamine  epinephrine  isoproterenol  reserpine This list may not describe all possible interactions. Give your health care provider a list of all the medicines, herbs, non-prescription drugs, or dietary supplements you use. Also tell them if you smoke, drink alcohol,  or use illegal drugs. Some items may interact with your medicine. What should I watch for while using this medicine? Visit your doctor or health care professional for regular check ups. Contact your doctor right away if your symptoms worsen. Check your blood pressure and pulse rate regularly. Ask your health care professional what your blood pressure and pulse rate should be, and when you should contact them. You may get drowsy or dizzy. Do not drive, use machinery, or do anything that needs mental alertness until you know how this medicine affects you. Do not sit or stand up quickly, especially if you are an older patient. This reduces the risk of dizzy or fainting spells. Contact your doctor if these symptoms continue. Alcohol may interfere with the effect of this medicine. Avoid alcoholic  drinks. This medicine may increase blood sugar. Ask your healthcare provider if changes in diet or medicines are needed if you have diabetes. What side effects may I notice from receiving this medicine? Side effects that you should report to your doctor or health care professional as soon as possible:  allergic reactions like skin rash, itching or hives  cold or numb hands or feet  depression  difficulty breathing  faint  fever with sore throat  irregular heartbeat, chest pain  rapid weight gain   signs and symptoms of high blood sugar such as being more thirsty or hungry or having to urinate more than normal. You may also feel very tired or have blurry vision.  swollen legs or ankles Side effects that usually do not require medical attention (report to your doctor or health care professional if they continue or are bothersome):  anxiety or nervousness  change in sex drive or performance  dry skin  headache  nightmares or trouble sleeping  short term memory loss  stomach upset or diarrhea This list may not describe all possible side effects. Call your doctor for medical advice about side effects. You may report side effects to FDA at 1-800-FDA-1088. Where should I keep my medicine? Keep out of the reach of children and pets. Store at room temperature between 20 and 25 degrees C (68 and 77 degrees F). Throw away any unused drug after the expiration date. NOTE: This sheet is a summary. It may not cover all possible information. If you have questions about this medicine, talk to your doctor, pharmacist, or health care provider.  2020 Elsevier/Gold Standard (2018-10-27 18:23:00)  Cardiac CT Angiogram A cardiac CT angiogram is a procedure to look at the heart and the area around the heart. It may be done to help find the cause of chest pains or other symptoms of heart disease. During this procedure, a substance called contrast dye is injected into the blood vessels in the  area to be checked. A large X-ray machine, called a CT scanner, then takes detailed pictures of the heart and the surrounding area. The procedure is also sometimes called a coronary CT angiogram, coronary artery scanning, or CTA. A cardiac CT angiogram allows the health care provider to see how well blood is flowing to and from the heart. The health care provider will be able to see if there are any problems, such as:  Blockage or narrowing of the coronary arteries in the heart.  Fluid around the heart.  Signs of weakness or disease in the muscles, valves, and tissues of the heart. Tell a health care provider about:  Any allergies you have. This is especially important if you have had a previous  allergic reaction to contrast dye.  All medicines you are taking, including vitamins, herbs, eye drops, creams, and over-the-counter medicines.  Any blood disorders you have.  Any surgeries you have had.  Any medical conditions you have.  Whether you are pregnant or may be pregnant.  Any anxiety disorders, chronic pain, or other conditions you have that may increase your stress or prevent you from lying still. What are the risks? Generally, this is a safe procedure. However, problems may occur, including: 1. Bleeding. 2. Infection. 3. Allergic reactions to medicines or dyes. 4. Damage to other structures or organs. 5. Kidney damage from the contrast dye that is used. 6. Increased risk of cancer from radiation exposure. This risk is low. Talk with your health care provider about: ? The risks and benefits of testing. ? How you can receive the lowest dose of radiation. What happens before the procedure? 1. Wear comfortable clothing and remove any jewelry, glasses, dentures, and hearing aids. 2. Follow instructions from your health care provider about eating and drinking. This may include: ? For 12 hours before the procedure -- avoid caffeine. This includes tea, coffee, soda, energy drinks,  and diet pills. Drink plenty of water or other fluids that do not have caffeine in them. Being well hydrated can prevent complications. ? For 4-6 hours before the procedure -- stop eating and drinking. The contrast dye can cause nausea, but this is less likely if your stomach is empty. 3. Ask your health care provider about changing or stopping your regular medicines. This is especially important if you are taking diabetes medicines, blood thinners, or medicines to treat problems with erections (erectile dysfunction). What happens during the procedure?  1. Hair on your chest may need to be removed so that small sticky patches called electrodes can be placed on your chest. These will transmit information that helps to monitor your heart during the procedure. 2. An IV will be inserted into one of your veins. 3. You might be given a medicine to control your heart rate during the procedure. This will help to ensure that good images are obtained. 4. You will be asked to lie on an exam table. This table will slide in and out of the CT machine during the procedure. 5. Contrast dye will be injected into the IV. You might feel warm, or you may get a metallic taste in your mouth. 6. You will be given a medicine called nitroglycerin. This will relax or dilate the arteries in your heart. 7. The table that you are lying on will move into the CT machine tunnel for the scan. 8. The person running the machine will give you instructions while the scans are being done. You may be asked to: ? Keep your arms above your head. ? Hold your breath. ? Stay very still, even if the table is moving. 9. When the scanning is complete, you will be moved out of the machine. 10. The IV will be removed. The procedure may vary among health care providers and hospitals. What can I expect after the procedure? After your procedure, it is common to have:  A metallic taste in your mouth from the contrast dye.  A feeling of  warmth.  A headache from the nitroglycerin. Follow these instructions at home:  Take over-the-counter and prescription medicines only as told by your health care provider.  If you are told, drink enough fluid to keep your urine pale yellow. This will help to flush the contrast dye out of  your body.  Most people can return to their normal activities right after the procedure. Ask your health care provider what activities are safe for you.  It is up to you to get the results of your procedure. Ask your health care provider, or the department that is doing the procedure, when your results will be ready.  Keep all follow-up visits as told by your health care provider. This is important. Contact a health care provider if: 1. You have any symptoms of allergy to the contrast dye. These include: ? Shortness of breath. ? Rash or hives. ? A racing heartbeat. Summary  A cardiac CT angiogram is a procedure to look at the heart and the area around the heart. It may be done to help find the cause of chest pains or other symptoms of heart disease.  During this procedure, a large X-ray machine, called a CT scanner, takes detailed pictures of the heart and the surrounding area after a contrast dye has been injected into blood vessels in the area.  Ask your health care provider about changing or stopping your regular medicines before the procedure. This is especially important if you are taking diabetes medicines, blood thinners, or medicines to treat erectile dysfunction.  If you are told, drink enough fluid to keep your urine pale yellow. This will help to flush the contrast dye out of your body. This information is not intended to replace advice given to you by your health care provider. Make sure you discuss any questions you have with your health care provider. Document Revised: 11/09/2018 Document Reviewed: 11/09/2018 Elsevier Patient Education  [employer].

## 2020-03-13 NOTE — Progress Notes (Signed)
Cardiology Office Note:    Date:  03/13/2020   ID:  Madison Hurst, DOB 25-Jul-1960, MRN [id number]  PCP:  Serita Grammes, MD  Cardiologist:  Berniece Salines, DO  Electrophysiologist:  None   Referring MD: Serita Grammes, MD   " I have has some shortness of breath and fatigue"  History of Present Illness:    Madison Hurst is a 59 y.o. female with a hx of seropositive rheumatoid arthritis, hypertension, iron deficiency anemia, hypothyroidism, type 2 diabetes presented to be evaluated for shortness of breath as well as dizziness.  During her initial visit I recommended she wear a ZIO monitor as well as an echocardiogram get done.  She is here today to discuss her testing results.    Past Medical History:  Diagnosis Date  . Anemia 02/09/2020  . Anxiety 10/28/2019  . Depressive disorder 10/28/2019  . Hypothyroidism 02/09/2020  . Iron deficiency anemia 01/24/2020  . Iron deficiency anemia, unspecified   . Multinodular goiter 03/07/2015  . Pericardial effusion 02/09/2020  . 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 Seropositive rheumatoid arthritis ([place]) 02/09/2020  . 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 Type 2 diabetes mellitus ([place]) 05/08/2019  . Vitamin D deficiency 02/09/2020    Past Surgical History:  Procedure Laterality Date  . CESAREAN SECTION    . CHOLECYSTECTOMY    . other     Growth removal on chest  . TOTAL SHOULDER ARTHROPLASTY Left     Current Medications: Current Meds  Medication Sig  . Abatacept (ORENCIA [place]) Inject into the skin once a week.  . ARIPiprazole (ABILIFY) 10 MG tablet Take 10 mg by mouth daily.  Marland Kitchen buPROPion (WELLBUTRIN XL) 150 MG 24 hr tablet Take 150 mg by mouth daily.  . DULoxetine (CYMBALTA) 60 MG capsule 60 mg.  . ergocalciferol (VITAMIN D2) 1.25 MG (50000 UT) capsule Take 50,000 Units by mouth once a week.  . flurbiprofen (ANSAID) 100 MG tablet Take 100 mg by mouth 2 (two) times daily.  . folic acid  (FOLVITE) 1 MG tablet Take 2 mg by mouth daily.  Marland Kitchen HYDROcodone-acetaminophen (NORCO/VICODIN) 5-325 MG tablet Take 1 tablet by mouth as needed.  Marland Kitchen levothyroxine (SYNTHROID) 75 MCG tablet Take 75 mcg by mouth daily before breakfast.  . metFORMIN (GLUMETZA) 500 MG (MOD) 24 hr tablet Take 500 mg by mouth daily with breakfast.  . Multiple Vitamin (MULTIVITAMIN) capsule Take 1 capsule by mouth daily.  Marland Kitchen ORENCIA CLICKJECT 706 MG/ML SOAJ   . predniSONE (DELTASONE) 10 MG tablet Take 10 mg by mouth daily with breakfast.  . telmisartan (MICARDIS) 20 MG tablet Take 20 mg by mouth daily.     Allergies:   Sulfa antibiotics and Sulfamethoxazole-trimethoprim   Social History   Socioeconomic History  . Marital status: Married    Spouse name: Not on file  . Number of children: Not on file  . Years of education: Not on file  . Highest education level: Not on file  Occupational History  . Not on file  Tobacco Use  . Smoking status: Former Smoker    Quit date: 02/11/1994    Years since quitting: 26.1  . Smokeless tobacco: Never Used  Vaping Use  . Vaping Use: Never used  Substance and Sexual Activity  . Alcohol use: Not on file  . Drug use: Not on file  . Sexual activity: Not on file  Other Topics Concern  . Not on file  Social History Narrative  . Not on file  Social Determinants of Health   Financial Resource Strain: Not on file  Food Insecurity: Not on file  Transportation Needs: Not on file  Physical Activity: Not on file  Stress: Not on file  Social Connections: Not on file     Family History: The patient's family history includes Diabetes in her brother and father; Heart disease in her brother; Muscular dystrophy in her mother.  ROS:   Review of Systems  Constitution: Negative for decreased appetite, fever and weight gain.  HENT: Negative for congestion, ear discharge, hoarse voice and sore throat.   Eyes: Negative for discharge, redness, vision loss in right eye and visual  halos.  Cardiovascular: Negative for chest pain, dyspnea on exertion, leg swelling, orthopnea and palpitations.  Respiratory: Negative for cough, hemoptysis, shortness of breath and snoring.   Endocrine: Negative for heat intolerance and polyphagia.  Hematologic/Lymphatic: Negative for bleeding problem. Does not bruise/bleed easily.  Skin: Negative for flushing, nail changes, rash and suspicious lesions.  Musculoskeletal: Negative for arthritis, joint pain, muscle cramps, myalgias, neck pain and stiffness.  Gastrointestinal: Negative for abdominal pain, bowel incontinence, diarrhea and excessive appetite.  Genitourinary: Negative for decreased libido, genital sores and incomplete emptying.  Neurological: Negative for brief paralysis, focal weakness, headaches and loss of balance.  Psychiatric/Behavioral: Negative for altered mental status, depression and suicidal ideas.  Allergic/Immunologic: Negative for HIV exposure and persistent infections.    EKGs/Labs/Other Studies Reviewed:    The following studies were reviewed today:   EKG:  The ekg ordered today demonstrates   Transthoracic echocardiogramIMPRESSIONS  1. Left ventricular ejection fraction, by estimation, is 40 to 45%. The left ventricle has mildly decreased function. The left ventricle has no  regional wall motion abnormalities. There is mild concentric left ventricular hypertrophy. Left ventricular  diastolic parameters are consistent with Grade I diastolic dysfunction (impaired relaxation).  2. Right ventricular systolic function is normal. The right ventricular size is normal. There is normal pulmonary artery systolic pressure.  3. Left atrial size was mildly dilated.  4. The mitral valve is normal in structure. Trivial mitral valve regurgitation. No evidence of mitral stenosis.  5. The aortic valve is normal in structure. Aortic valve regurgitation is not visualized. No aortic stenosis is present.  6. The inferior vena  cava is normal in size with greater than 50% respiratory variability, suggesting right atrial pressure of 3 mmHg.   Zio monitor The patient wore the monitor for 8 days 13 hours starting February 12, 2020. Indication: Dizziness  The minimum heart rate was 85 bpm, maximum heart rate was 272 bpm, and average heart rate was 99 bpm. Predominant underlying rhythm was Sinus Rhythm.  90 Supraventricular Tachycardia runs occurred, the run with the fastest interval lasting 7 beats with a maximum rate of 272 bpm, the longest lasting 14 beats with an average rate of 131 bpm.  Premature atrial complexes were frequent (6.7%, [id number]). Premature Ventricular complexes were rare less than 1%.  No ventricular tachycardia, no pauses, No AV block and no atrial fibrillation present. No patient triggered events or diary events were noted.   Conclusion: This study is remarkable for the following:                             1.  Supraventricular tachycardia which is likely atrial tachycardia with variable block.  2.  Frequent premature complexes (6.7%, [id number]).   Recent Labs: 01/24/2020: BUN 7; Creatinine 0.5; Hemoglobin 9.4; Platelets 530; Potassium 3.3; Sodium 141  Recent Lipid Panel No results found for: CHOL, TRIG, HDL, CHOLHDL, VLDL, LDLCALC, LDLDIRECT  Physical Exam:    VS:  BP 118/68   Pulse 88   Ht 5' (1.524 m)   Wt 123 lb 3.2 oz (55.9 kg)   SpO2 98%   BMI 24.06 kg/m     Wt Readings from Last 3 Encounters:  03/13/20 123 lb 3.2 oz (55.9 kg)  02/12/20 125 lb (56.7 kg)  01/30/20 131 lb 14.4 oz (59.8 kg)     GEN: Well nourished, well developed in no acute distress HEENT: Normal NECK: No JVD; No carotid bruits LYMPHATICS: No lymphadenopathy CARDIAC: S1S2 noted,RRR, no murmurs, rubs, gallops RESPIRATORY:  Clear to auscultation without rales, wheezing or rhonchi  ABDOMEN: Soft, non-tender, non-distended, +bowel sounds, no guarding. EXTREMITIES: No edema, No  cyanosis, no clubbing MUSCULOSKELETAL:  No deformity  SKIN: Warm and dry NEUROLOGIC:  Alert and oriented x 3, non-focal PSYCHIATRIC:  Normal affect, good insight  ASSESSMENT:    1. DOE (dyspnea on exertion)   2. Metabolic syndrome   3. Depressed left ventricular ejection fraction   4. PAC (premature atrial contraction)   5. PAT (paroxysmal atrial tachycardia) (HCC)    PLAN:    She does still have some shortness of breath on exertion now with a depressed ejection fraction she has risk factors significantly for coronary artery disease which includes diabetes, rheumatoid arthritis, hypertension I like to proceed with a ischemic evaluation in this patient for completeness.  We discussed a coronary CTA will be appropriate at this time.  She has no IV contrast allergy she is agreeable to proceed.  In terms of her significant PAC burden as well as paroxysmal atrial tachycardia like to start the patient on low-dose beta-blocker Toprol-XL 12.5.  Is also help in the setting of her depressed ejection fraction as well.  With metabolic syndrome and her significant fatigue we will get a vitamin D level to make sure vitamin D deficiency is not playing a role here.  The patient is in agreement with the above plan. The patient left the office in stable condition.  The patient will follow up in 3 months or sooner if needed.   Medication Adjustments/Labs and Tests Ordered: Current medicines are reviewed at length with the patient today.  Concerns regarding medicines are outlined above.  Orders Placed This Encounter  Procedures  . CT CORONARY MORPH W/CTA COR W/SCORE W/CA W/CM &/OR WO/CM  . CT CORONARY FRACTIONAL FLOW RESERVE DATA PREP  . CT CORONARY FRACTIONAL FLOW RESERVE FLUID ANALYSIS  . VITAMIN D 25 Hydroxy (Vit-D Deficiency, Fractures)   Meds ordered this encounter  Medications  . metoprolol succinate (TOPROL XL) 25 MG 24 hr tablet    Sig: Take 0.5 tablets (12.5 mg total) by mouth daily.     Dispense:  30 tablet    Refill:  3    Patient Instructions  Medication Instructions:  Your physician has recommended you make the following change in your medication:   Start Torpol XL 12.5 mg daily.  *If you need a refill on your cardiac medications before your next appointment, please call your pharmacy*   Lab Work: Your physician recommends that you have a Vitamin D level done today.  Your physician recommends that you return for lab work in: 1 week prior to your CT. You can come Monday through  Friday 8:30 am to 12:00 pm and 1:15 to 4:30. You do not need to make an appointment as the order has already been placed.   If you have labs (blood work) drawn today and your tests are completely normal, you will receive your results only by: Marland Kitchen MyChart Message (if you have MyChart) OR . A paper copy in the mail If you have any lab test that is abnormal or we need to change your treatment, we will call you to review the results.   Testing/Procedures: Your cardiac CT will be scheduled at:   [facility], [place] [address] [phone]   If scheduled at [facility], please arrive at the  [facility] main entrance of [facility] 30 minutes prior to test start time. Proceed to the [facility] Radiology Department (first floor) to check-in and test prep.  Please follow these instructions carefully (unless otherwise directed):  On the Night Before the Test: . Be sure to Drink plenty of water. . Do not consume any caffeinated/decaffeinated beverages or chocolate 12 hours prior to your test. . Do not take any antihistamines 12 hours prior to your test.   On the Day of the Test: . Drink plenty of water. Do not drink any water within one hour of the test. . Do not eat any food 4 hours prior to the test. . You may take your regular medications prior to the test.  . Take metoprolol (ToprolXL)two hours prior to test. . FEMALES- please wear  underwire-free bra if available  After the Test: . Drink plenty of water. . After receiving IV contrast, you may experience a mild flushed feeling. This is normal. . On occasion, you may experience a mild rash up to 24 hours after the test. This is not dangerous. If this occurs, you can take Benadryl 25 mg and increase your fluid intake. . If you experience trouble breathing, this can be serious. If it is severe call 911 IMMEDIATELY. If it is mild, please call our office. . If you take any of these medications: Glipizide/Metformin, Avandament, Glucavance, please do not take 48 hours after completing test unless otherwise instructed.   Once we have confirmed authorization from your insurance company, we will call you to set up a date and time for your test. Based on how quickly your insurance processes prior authorizations requests, please allow up to 4 weeks to be contacted for scheduling your Cardiac CT appointment. Be advised that routine Cardiac CT appointments could be scheduled as many as 8 weeks after your provider has ordered it.  For non-scheduling related questions, please contact the cardiac imaging nurse navigator should you have any questions/concerns: Marchia Bond, Cardiac Imaging Nurse Navigator Burley Saver, Interim Cardiac Imaging Nurse [place] [facility] Direct Office Dial: [phone]   For scheduling needs, including cancellations and rescheduling, please call Vivien Rota at [phone].     Follow-Up: At [facility], you and your health needs are our priority.  As part of our continuing mission to provide you with exceptional heart care, we have created designated Provider Care Teams.  These Care Teams include your primary Cardiologist (physician) and Advanced Practice Providers (APPs -  Physician Assistants and Nurse Practitioners) who all work together to provide you with the care you need, when you need it.  We recommend signing up for the  patient portal called "MyChart".  Sign up information is provided on this After Visit Summary.  MyChart is used  to connect with patients for Virtual Visits (Telemedicine).  Patients are able to view lab/test results, encounter notes, upcoming appointments, etc.  Non-urgent messages can be sent to your provider as well.   To learn more about what you can do with MyChart, go to [email or web address].    Your next appointment:   3 month(s)  The format for your next appointment:   In Person  Provider:   Berniece Salines, DO   Other Instructions Metoprolol Extended-Release Tablets What is this medicine? METOPROLOL (me TOE proe lole) is a beta blocker. It decreases the amount of work your heart has to do and helps your heart beat regularly. It treats high blood pressure and/or prevent chest pain (also called angina). It also treats heart failure. This medicine may be used for other purposes; ask your health care provider or pharmacist if you have questions. COMMON BRAND NAME(S): toprol, Toprol XL What should I tell my health care provider before I take this medicine? They need to know if you have any of these conditions:  diabetes  heart or vessel disease like slow heart rate, worsening heart failure, heart block, sick sinus syndrome or Raynaud's disease  kidney disease  liver disease  lung or breathing disease, like asthma or emphysema  pheochromocytoma  thyroid disease  an unusual or allergic reaction to metoprolol, other beta-blockers, medicines, foods, dyes, or preservatives  pregnant or trying to get pregnant  breast-feeding How should I use this medicine? Take this drug by mouth. Take it as directed on the prescription label at the same time every day. Take it with food. You may cut the tablet in half if it is scored (has a line in the middle of it). This may help you swallow the tablet if the whole tablet is too big. Be sure to take both halves. Do not take just one-half of  the tablet. Keep taking it unless your health care provider tells you to stop. Talk to your health care provider about the use of this drug in children. While it may be prescribed for children as young as 6 for selected conditions, precautions do apply. Overdosage: If you think you have taken too much of this medicine contact a poison control center or emergency room at once. NOTE: This medicine is only for you. Do not share this medicine with others. What if I miss a dose? If you miss a dose, take it as soon as you can. If it is almost time for your next dose, take only that dose. Do not take double or extra doses. What may interact with this medicine? This medicine may interact with the following medications:  certain medicines for blood pressure, heart disease, irregular heart beat  certain medicines for depression, like monoamine oxidase (MAO) inhibitors, fluoxetine, or paroxetine  clonidine  dobutamine  epinephrine  isoproterenol  reserpine This list may not describe all possible interactions. Give your health care provider a list of all the medicines, herbs, non-prescription drugs, or dietary supplements you use. Also tell them if you smoke, drink alcohol, or use illegal drugs. Some items may interact with your medicine. What should I watch for while using this medicine? Visit your doctor or health care professional for regular check ups. Contact your doctor right away if your symptoms worsen. Check your blood pressure and pulse rate regularly. Ask your health care professional what your blood pressure and pulse rate should be, and when you should contact them. You may get drowsy or dizzy. Do not drive, use machinery,  or do anything that needs mental alertness until you know how this medicine affects you. Do not sit or stand up quickly, especially if you are an older patient. This reduces the risk of dizzy or fainting spells. Contact your doctor if these symptoms continue. Alcohol may  interfere with the effect of this medicine. Avoid alcoholic drinks. This medicine may increase blood sugar. Ask your healthcare provider if changes in diet or medicines are needed if you have diabetes. What side effects may I notice from receiving this medicine? Side effects that you should report to your doctor or health care professional as soon as possible:  allergic reactions like skin rash, itching or hives  cold or numb hands or feet  depression  difficulty breathing  faint  fever with sore throat  irregular heartbeat, chest pain  rapid weight gain   signs and symptoms of high blood sugar such as being more thirsty or hungry or having to urinate more than normal. You may also feel very tired or have blurry vision.  swollen legs or ankles Side effects that usually do not require medical attention (report to your doctor or health care professional if they continue or are bothersome):  anxiety or nervousness  change in sex drive or performance  dry skin  headache  nightmares or trouble sleeping  short term memory loss  stomach upset or diarrhea This list may not describe all possible side effects. Call your doctor for medical advice about side effects. You may report side effects to FDA at 1-800-FDA-1088. Where should I keep my medicine? Keep out of the reach of children and pets. Store at room temperature between 20 and 25 degrees C (68 and 77 degrees F). Throw away any unused drug after the expiration date. NOTE: This sheet is a summary. It may not cover all possible information. If you have questions about this medicine, talk to your doctor, pharmacist, or health care provider.  2020 Elsevier/Gold Standard (2018-10-27 18:23:00)  Cardiac CT Angiogram A cardiac CT angiogram is a procedure to look at the heart and the area around the heart. It may be done to help find the cause of chest pains or other symptoms of heart disease. During this procedure, a substance  called contrast dye is injected into the blood vessels in the area to be checked. A large X-ray machine, called a CT scanner, then takes detailed pictures of the heart and the surrounding area. The procedure is also sometimes called a coronary CT angiogram, coronary artery scanning, or CTA. A cardiac CT angiogram allows the health care provider to see how well blood is flowing to and from the heart. The health care provider will be able to see if there are any problems, such as:  Blockage or narrowing of the coronary arteries in the heart.  Fluid around the heart.  Signs of weakness or disease in the muscles, valves, and tissues of the heart. Tell a health care provider about:  Any allergies you have. This is especially important if you have had a previous allergic reaction to contrast dye.  All medicines you are taking, including vitamins, herbs, eye drops, creams, and over-the-counter medicines.  Any blood disorders you have.  Any surgeries you have had.  Any medical conditions you have.  Whether you are pregnant or may be pregnant.  Any anxiety disorders, chronic pain, or other conditions you have that may increase your stress or prevent you from lying still. What are the risks? Generally, this is a safe procedure.  However, problems may occur, including: 1. Bleeding. 2. Infection. 3. Allergic reactions to medicines or dyes. 4. Damage to other structures or organs. 5. Kidney damage from the contrast dye that is used. 6. Increased risk of cancer from radiation exposure. This risk is low. Talk with your health care provider about: ? The risks and benefits of testing. ? How you can receive the lowest dose of radiation. What happens before the procedure? 1. Wear comfortable clothing and remove any jewelry, glasses, dentures, and hearing aids. 2. Follow instructions from your health care provider about eating and drinking. This may include: ? For 12 hours before the procedure --  avoid caffeine. This includes tea, coffee, soda, energy drinks, and diet pills. Drink plenty of water or other fluids that do not have caffeine in them. Being well hydrated can prevent complications. ? For 4-6 hours before the procedure -- stop eating and drinking. The contrast dye can cause nausea, but this is less likely if your stomach is empty. 3. Ask your health care provider about changing or stopping your regular medicines. This is especially important if you are taking diabetes medicines, blood thinners, or medicines to treat problems with erections (erectile dysfunction). What happens during the procedure?  1. Hair on your chest may need to be removed so that small sticky patches called electrodes can be placed on your chest. These will transmit information that helps to monitor your heart during the procedure. 2. An IV will be inserted into one of your veins. 3. You might be given a medicine to control your heart rate during the procedure. This will help to ensure that good images are obtained. 4. You will be asked to lie on an exam table. This table will slide in and out of the CT machine during the procedure. 5. Contrast dye will be injected into the IV. You might feel warm, or you may get a metallic taste in your mouth. 6. You will be given a medicine called nitroglycerin. This will relax or dilate the arteries in your heart. 7. The table that you are lying on will move into the CT machine tunnel for the scan. 8. The person running the machine will give you instructions while the scans are being done. You may be asked to: ? Keep your arms above your head. ? Hold your breath. ? Stay very still, even if the table is moving. 9. When the scanning is complete, you will be moved out of the machine. 10. The IV will be removed. The procedure may vary among health care providers and hospitals. What can I expect after the procedure? After your procedure, it is common to have:  A metallic  taste in your mouth from the contrast dye.  A feeling of warmth.  A headache from the nitroglycerin. Follow these instructions at home:  Take over-the-counter and prescription medicines only as told by your health care provider.  If you are told, drink enough fluid to keep your urine pale yellow. This will help to flush the contrast dye out of your body.  Most people can return to their normal activities right after the procedure. Ask your health care provider what activities are safe for you.  It is up to you to get the results of your procedure. Ask your health care provider, or the department that is doing the procedure, when your results will be ready.  Keep all follow-up visits as told by your health care provider. This is important. Contact a health care provider if:  1. You have any symptoms of allergy to the contrast dye. These include: ? Shortness of breath. ? Rash or hives. ? A racing heartbeat. Summary  A cardiac CT angiogram is a procedure to look at the heart and the area around the heart. It may be done to help find the cause of chest pains or other symptoms of heart disease.  During this procedure, a large X-ray machine, called a CT scanner, takes detailed pictures of the heart and the surrounding area after a contrast dye has been injected into blood vessels in the area.  Ask your health care provider about changing or stopping your regular medicines before the procedure. This is especially important if you are taking diabetes medicines, blood thinners, or medicines to treat erectile dysfunction.  If you are told, drink enough fluid to keep your urine pale yellow. This will help to flush the contrast dye out of your body. This information is not intended to replace advice given to you by your health care provider. Make sure you discuss any questions you have with your health care provider. Document Revised: 11/09/2018 Document Reviewed: 11/09/2018 Elsevier Patient  Education  [place].      Adopting a Healthy Lifestyle.  Know what a healthy weight is for you (roughly BMI <25) and aim to maintain this   Aim for 7+ servings of fruits and vegetables daily   65-80+ fluid ounces of water or unsweet tea for healthy kidneys   Limit to max 1 drink of alcohol per day; avoid smoking/tobacco   Limit animal fats in diet for cholesterol and heart health - choose grass fed whenever available   Avoid highly processed foods, and foods high in saturated/trans fats   Aim for low stress - take time to unwind and care for your mental health   Aim for 150 min of moderate intensity exercise weekly for heart health, and weights twice weekly for bone health   Aim for 7-9 hours of sleep daily   When it comes to diets, agreement about the perfect plan isnt easy to find, even among the experts. Experts at the [employer] developed an idea known as the Healthy Eating Plate. Just imagine a plate divided into logical, healthy portions.   The emphasis is on diet quality:   Load up on vegetables and fruits - one-half of your plate: Aim for color and variety, and remember that potatoes dont count.   Go for whole grains - one-quarter of your plate: Whole wheat, barley, wheat berries, quinoa, oats, brown rice, and foods made with them. If you want pasta, go with whole wheat pasta.   Protein power - one-quarter of your plate: Fish, chicken, beans, and nuts are all healthy, versatile protein sources. Limit red meat.   The diet, however, does go beyond the plate, offering a few other suggestions.   Use healthy plant oils, such as olive, canola, soy, corn, sunflower and peanut. Check the labels, and avoid partially hydrogenated oil, which have unhealthy trans fats.   If youre thirsty, drink water. Coffee and tea are good in moderation, but skip sugary drinks and limit milk and dairy products to one or two daily servings.   The type of  carbohydrate in the diet is more important than the amount. Some sources of carbohydrates, such as vegetables, fruits, whole grains, and beans-are healthier than others.   Finally, stay active  Signed, Berniece Salines, DO  03/13/2020 11:24 AM    [facility] 

## 2020-03-14 LAB — VITAMIN D 25 HYDROXY (VIT D DEFICIENCY, FRACTURES): Vit D, 25-Hydroxy: 39.2 ng/mL (ref 30.0–100.0)

## 2020-03-15 ENCOUNTER — Telehealth: Payer: Self-pay

## 2020-03-15 NOTE — Telephone Encounter (Signed)
-----   Message from Kardie Tobb, DO sent at 03/15/2020 12:39 PM EST ----- Labs normal 

## 2020-03-15 NOTE — Telephone Encounter (Signed)
Patient notified of lab results

## 2020-03-27 NOTE — Addendum Note (Signed)
Addended by: Eleonore Chiquito on: 03/27/2020 09:44 AM   Modules accepted: Orders

## 2020-04-03 DIAGNOSIS — Z6824 Body mass index (BMI) 24.0-24.9, adult: Secondary | ICD-10-CM | POA: Diagnosis not present

## 2020-04-03 DIAGNOSIS — M545 Low back pain, unspecified: Secondary | ICD-10-CM | POA: Diagnosis not present

## 2020-04-03 DIAGNOSIS — M48062 Spinal stenosis, lumbar region with neurogenic claudication: Secondary | ICD-10-CM | POA: Diagnosis not present

## 2020-04-03 DIAGNOSIS — M4316 Spondylolisthesis, lumbar region: Secondary | ICD-10-CM | POA: Diagnosis not present

## 2020-04-04 ENCOUNTER — Ambulatory Visit: Payer: Medicare PPO | Admitting: Cardiology

## 2020-04-04 DIAGNOSIS — E8881 Metabolic syndrome: Secondary | ICD-10-CM | POA: Diagnosis not present

## 2020-04-04 DIAGNOSIS — I491 Atrial premature depolarization: Secondary | ICD-10-CM | POA: Diagnosis not present

## 2020-04-04 DIAGNOSIS — I471 Supraventricular tachycardia: Secondary | ICD-10-CM | POA: Diagnosis not present

## 2020-04-04 DIAGNOSIS — R0600 Dyspnea, unspecified: Secondary | ICD-10-CM | POA: Diagnosis not present

## 2020-04-04 LAB — BASIC METABOLIC PANEL
BUN/Creatinine Ratio: 16 (ref 9–23)
BUN: 8 mg/dL (ref 6–24)
CO2: 26 mmol/L (ref 20–29)
Calcium: 9.8 mg/dL (ref 8.7–10.2)
Chloride: 96 mmol/L (ref 96–106)
Creatinine, Ser: 0.49 mg/dL — ABNORMAL LOW (ref 0.57–1.00)
GFR calc Af Amer: 125 mL/min/{1.73_m2} (ref >59)
GFR calc non Af Amer: 109 mL/min/{1.73_m2} (ref >59)
Glucose: 143 mg/dL — ABNORMAL HIGH (ref 65–99)
Potassium: 3.9 mmol/L (ref 3.5–5.2)
Sodium: 140 mmol/L (ref 134–144)

## 2020-04-05 ENCOUNTER — Telehealth: Payer: Self-pay

## 2020-04-05 NOTE — Telephone Encounter (Signed)
-----   Message from Thomasene Ripple, DO sent at 04/05/2020 12:18 PM EST ----- Blood glucose elevated otherwise other labs are normal

## 2020-04-05 NOTE — Telephone Encounter (Signed)
Madison Hurst is returning Javanna's call. Please advise.

## 2020-04-05 NOTE — Telephone Encounter (Signed)
Left a message to return my call.

## 2020-04-05 NOTE — Telephone Encounter (Signed)
-----   Message from Kardie Tobb, DO sent at 04/05/2020 12:18 PM EST ----- Blood glucose elevated otherwise other labs are normal 

## 2020-04-05 NOTE — Telephone Encounter (Signed)
Patient notified of results and verbalized understanding.  

## 2020-04-08 DIAGNOSIS — M150 Primary generalized (osteo)arthritis: Secondary | ICD-10-CM | POA: Diagnosis not present

## 2020-04-08 DIAGNOSIS — M0579 Rheumatoid arthritis with rheumatoid factor of multiple sites without organ or systems involvement: Secondary | ICD-10-CM | POA: Diagnosis not present

## 2020-04-08 DIAGNOSIS — M25571 Pain in right ankle and joints of right foot: Secondary | ICD-10-CM | POA: Diagnosis not present

## 2020-04-08 DIAGNOSIS — Z6824 Body mass index (BMI) 24.0-24.9, adult: Secondary | ICD-10-CM | POA: Diagnosis not present

## 2020-04-08 DIAGNOSIS — M25572 Pain in left ankle and joints of left foot: Secondary | ICD-10-CM | POA: Diagnosis not present

## 2020-04-08 NOTE — Telephone Encounter (Signed)
Return patient call and left a message  

## 2020-04-10 ENCOUNTER — Telehealth (HOSPITAL_COMMUNITY): Payer: Self-pay | Admitting: Emergency Medicine

## 2020-04-10 DIAGNOSIS — E039 Hypothyroidism, unspecified: Secondary | ICD-10-CM | POA: Diagnosis not present

## 2020-04-10 DIAGNOSIS — F324 Major depressive disorder, single episode, in partial remission: Secondary | ICD-10-CM | POA: Diagnosis not present

## 2020-04-10 DIAGNOSIS — L4050 Arthropathic psoriasis, unspecified: Secondary | ICD-10-CM | POA: Diagnosis not present

## 2020-04-10 DIAGNOSIS — E1169 Type 2 diabetes mellitus with other specified complication: Secondary | ICD-10-CM | POA: Diagnosis not present

## 2020-04-10 DIAGNOSIS — E785 Hyperlipidemia, unspecified: Secondary | ICD-10-CM | POA: Diagnosis not present

## 2020-04-10 DIAGNOSIS — Z23 Encounter for immunization: Secondary | ICD-10-CM | POA: Diagnosis not present

## 2020-04-10 DIAGNOSIS — B373 Candidiasis of vulva and vagina: Secondary | ICD-10-CM | POA: Diagnosis not present

## 2020-04-10 DIAGNOSIS — Z6825 Body mass index (BMI) 25.0-25.9, adult: Secondary | ICD-10-CM | POA: Diagnosis not present

## 2020-04-10 NOTE — Telephone Encounter (Signed)
Reaching out to patient to offer assistance regarding upcoming cardiac imaging study; pt verbalizes understanding of appt date/time, parking situation and where to check in, pre-test NPO status and medications ordered, and verified current allergies; name and call back number provided for further questions should they arise Zakhia Seres RN Navigator Cardiac Imaging [facility] [phone] office [phone] cell 

## 2020-04-11 ENCOUNTER — Ambulatory Visit (HOSPITAL_COMMUNITY)
Admission: RE | Admit: 2020-04-11 | Discharge: 2020-04-11 | Disposition: A | Discharge status: Home / Self Care | Payer: Medicare PPO | Source: Ambulatory Visit | Attending: Cardiology | Admitting: Cardiology

## 2020-04-11 ENCOUNTER — Other Ambulatory Visit: Payer: Self-pay

## 2020-04-11 ENCOUNTER — Encounter (HOSPITAL_COMMUNITY): Payer: Self-pay

## 2020-04-11 DIAGNOSIS — I2510 Atherosclerotic heart disease of native coronary artery without angina pectoris: Secondary | ICD-10-CM | POA: Insufficient documentation

## 2020-04-11 DIAGNOSIS — R0609 Other forms of dyspnea: Secondary | ICD-10-CM

## 2020-04-11 DIAGNOSIS — Z006 Encounter for examination for normal comparison and control in clinical research program: Secondary | ICD-10-CM

## 2020-04-11 DIAGNOSIS — J90 Pleural effusion, not elsewhere classified: Secondary | ICD-10-CM | POA: Diagnosis not present

## 2020-04-11 DIAGNOSIS — R0600 Dyspnea, unspecified: Secondary | ICD-10-CM

## 2020-04-11 DIAGNOSIS — I700 Atherosclerosis of aorta: Secondary | ICD-10-CM | POA: Insufficient documentation

## 2020-04-11 IMAGING — CT CT HEART MORP W/ CTA COR W/ SCORE W/ CA W/CM &/OR W/O CM
4 of 7 series · 8 of 20 positions shown, 9 images · IV contrast (APPLIED)
Comparison: None.
COMPARISON: None.

Addendum:
EXAM:
OVER-READ INTERPRETATION CT CHEST

The following report is an over-read performed by radiologist Dr.
over-read does not include interpretation of cardiac or coronary
anatomy or pathology. The coronary calcium score/coronary CTA
interpretation by the cardiologist is attached.
CLINICAL DATA: 59 year old female with chest pain and depressed
ejection fraction.
Cardiac/Coronary  CT
TECHNIQUE: The patient was scanned on a [employer] Force scanner.

[Series 6: best diast 95 % · axial · 0.35mm/px · z∈[+905,+942]mm · 2 of 282 slices shown, 3 images]
[im 94/282  vessel]
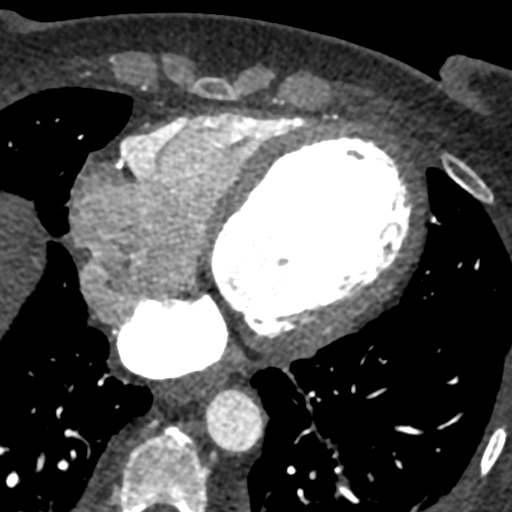
[im 94/282  lung]
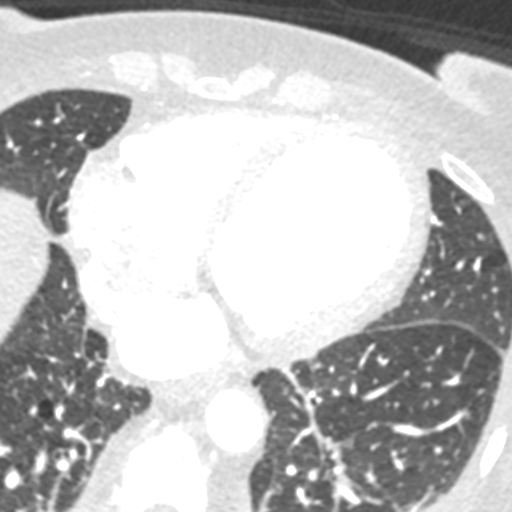
[im 188/282  vessel]
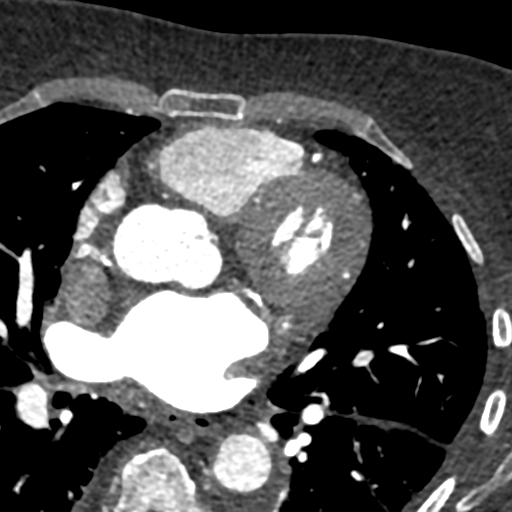

[Series 7: best syst 38 % · axial · 0.35mm/px · z∈[+905,+942]mm · 2 of 282 slices shown]
[im 94/282  vessel]
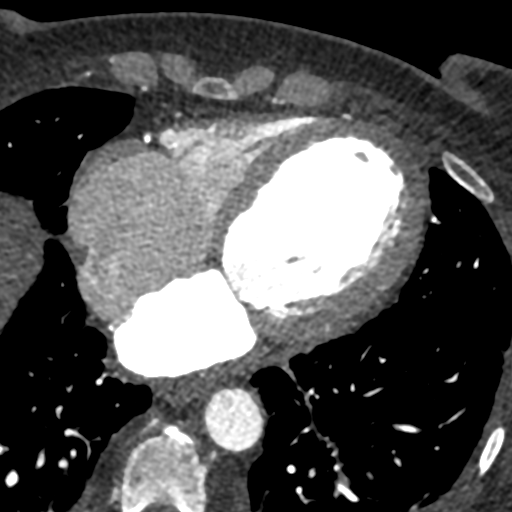
[im 188/282  vessel]
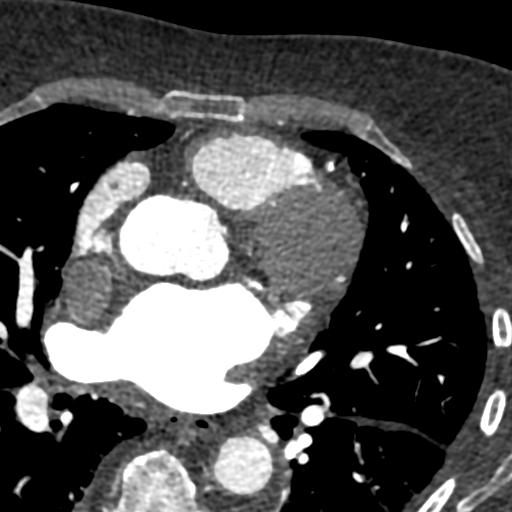

[Series 16: best diast ts 95 % · axial · 0.35mm/px · z∈[+905,+942]mm · 2 of 282 slices shown]
[im 94/282  vessel]
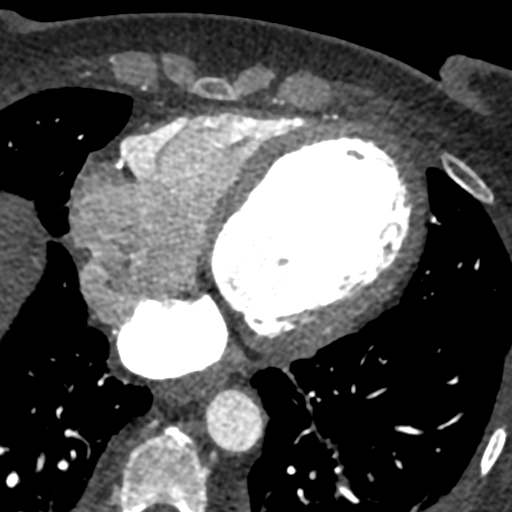
[im 188/282  vessel]
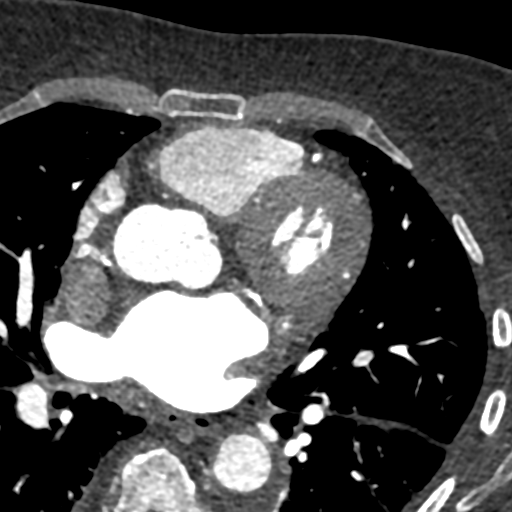

[Series 17: best syst ts 38 % · axial · 0.35mm/px · z∈[+905,+942]mm · 2 of 282 slices shown]
[im 94/282  vessel]
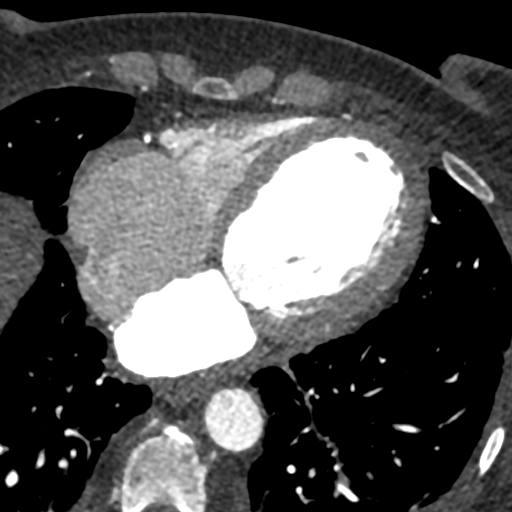
[im 188/282  vessel]
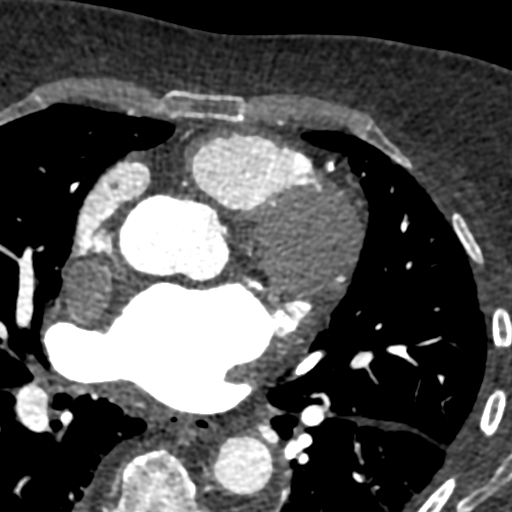

[8 of 20 positions shown; findings below may reference images not displayed]

FINDINGS: Vascular: Moderate atherosclerosis involving the descending thoracic
aorta with calcified and noncalcified plaque. Reflux of contrast
from the RIGHT atrium into the IVC and hepatic veins.

Mediastinum/Nodes: Scarring in the anterior superior mediastinum at
the site of prior mediastinal mass excision. No pathologic
lymphadenopathy within the visualized mediastinum. Visualized
esophagus normal in appearance.

Lungs/Pleura: Mild diffuse interstitial opacities throughout both
lungs likely indicating mild pulmonary edema. Chronic scarring in
the lower lobes. Small air cyst in the superior segment RIGHT LOWER
LOBE, unchanged. Small to moderate-sized BILATERAL pleural
effusions, including fluid in the major fissure on the LEFT.

Upper Abdomen: Unremarkable for the early arterial phase of
enhancement.

Musculoskeletal: Severe degenerative disc disease and spondylosis
involving the visualized mid and lower thoracic spine.
IMPRESSION: 1. Mild diffuse interstitial opacities throughout both lungs, likely
indicating mild pulmonary edema.
2. Small to moderate-sized BILATERAL pleural effusions, including
fluid in the major fissure on the LEFT.
3. Reflux of contrast from the RIGHT atrium into the IVC and hepatic
veins, likely indicating elevated RIGHT heart pressures and/or
tricuspid regurgitation.
4. Chronic scarring in the anterior superior mediastinum at the site
of prior mediastinal mass excision.

Aortic Atherosclerosis ([AM]-[AM]).
FINDINGS: A 120 kV retrospective scan was triggered in the descending thoracic
aorta at 111 HU's. Axial non-contrast 3 mm slices were carried out
through the heart. The data set was analyzed on a dedicated work
station and scored using the Agatson method. Gantry rotation speed
was 250 msecs and collimation was .6 mm. No beta blockade and 0.8 mg
of sl NTG was given. The 3D data set was reconstructed in 5%
intervals of the 67-82 % of the R-R cycle. Diastolic phases were
analyzed on a dedicated work station using MPR, MIP and VRT modes.
The patient received 80 cc of contrast.

Aorta: Normal size.  No calcifications.  No dissection.

Aortic Valve:  Trileaflet.  No calcifications.

Coronary calcium score: [AM]

Coronary Arteries:  Normal coronary origin.  Right dominance.

RCA is a large dominant artery that gives rise to PDA and PLVB.
There is a Proximal mild (25-49%) calcified plaque. The mid RDA with
a soft plaque with high risk appearance (napkin ring). The mid to
distal RCA with diffuse mild mixed lesions. The distal RCA with a
severe (>70) soft plaque.

Left main is a large artery that gives rise to LAD, Ramus
Intermedius and LCX arteries.

LAD is a large vessel. The proximal LAD with no plaque. The mid LAD
with a moderate (50-69%) mixed lesion that as it terminates appears
to look >70%.

Ramus is a medium sized vessel. There is no plaques in the proximal
portion. The mid Ramus with moderate (50-69%) lesion.

LCX is a non-dominant artery that gives rise to one large OM1
branch. There is proximal to mid segmental moderate lesions.

Other findings:

Normal pulmonary vein drainage into the left atrium.

Normal left atrial appendage without a thrombus.

Normal size of the pulmonary artery.
IMPRESSION: 1. Coronary calcium score of [AM]. This was 99 percentile for age
and sex matched control.

2. Normal coronary origin with right dominance.

3. Multivessel CAD. CADRADS 4A. This study will be send for FFRct.
Consider cardiac catheterization.

TIGER, DO

*** End of Addendum ***
EXAM:
OVER-READ INTERPRETATION CT CHEST

The following report is an over-read performed by radiologist Dr.
over-read does not include interpretation of cardiac or coronary
anatomy or pathology. The coronary calcium score/coronary CTA
interpretation by the cardiologist is attached.
FINDINGS: Vascular: Moderate atherosclerosis involving the descending thoracic
aorta with calcified and noncalcified plaque. Reflux of contrast
from the RIGHT atrium into the IVC and hepatic veins.

Mediastinum/Nodes: Scarring in the anterior superior mediastinum at
the site of prior mediastinal mass excision. No pathologic
lymphadenopathy within the visualized mediastinum. Visualized
esophagus normal in appearance.

Lungs/Pleura: Mild diffuse interstitial opacities throughout both
lungs likely indicating mild pulmonary edema. Chronic scarring in
the lower lobes. Small air cyst in the superior segment RIGHT LOWER
LOBE, unchanged. Small to moderate-sized BILATERAL pleural
effusions, including fluid in the major fissure on the LEFT.

Upper Abdomen: Unremarkable for the early arterial phase of
enhancement.

Musculoskeletal: Severe degenerative disc disease and spondylosis
involving the visualized mid and lower thoracic spine.
IMPRESSION: 1. Mild diffuse interstitial opacities throughout both lungs, likely
indicating mild pulmonary edema.
2. Small to moderate-sized BILATERAL pleural effusions, including
fluid in the major fissure on the LEFT.
3. Reflux of contrast from the RIGHT atrium into the IVC and hepatic
veins, likely indicating elevated RIGHT heart pressures and/or
tricuspid regurgitation.
4. Chronic scarring in the anterior superior mediastinum at the site
of prior mediastinal mass excision.

Aortic Atherosclerosis ([AM]-[AM]).

## 2020-04-11 MED ORDER — NITROGLYCERIN 0.4 MG SL SUBL
SUBLINGUAL_TABLET | SUBLINGUAL | Status: AC
  Filled 2020-04-11: qty 2

## 2020-04-11 MED ORDER — IOHEXOL 350 MG/ML SOLN
80.0000 mL | Freq: Once | INTRAVENOUS | Status: AC | PRN
  Administered 2020-04-11: 80 mL via INTRAVENOUS

## 2020-04-11 MED ORDER — NITROGLYCERIN 0.4 MG SL SUBL: 0.8000 mg | SUBLINGUAL_TABLET | Freq: Once | SUBLINGUAL | Status: DC

## 2020-04-11 MED ORDER — METOPROLOL TARTRATE 5 MG/5ML IV SOLN
INTRAVENOUS | Status: AC
  Administered 2020-04-11: 5 mg via INTRAVENOUS
  Filled 2020-04-11: qty 20

## 2020-04-11 MED ORDER — METOPROLOL TARTRATE 5 MG/5ML IV SOLN: 5.0000 mg | INTRAVENOUS | Status: DC | PRN

## 2020-04-11 NOTE — Progress Notes (Signed)
CADFEM-Identify Informed Consent   Subject Name: Madison Hurst  Subject met inclusion and exclusion criteria.  The informed consent form, study requirements and expectations were reviewed with the subject and questions and concerns were addressed prior to the signing of the consent form.  The subject verbalized understanding of the trial requirements.  The subject agreed to participate in the CADFEM-Identify trial and signed the informed consent at 1220 on 04/11/2020.  The informed consent was obtained prior to performance of any protocol-specific procedures for the subject.  A copy of the signed informed consent was given to the subject and a copy was placed in the subject's medical record.   TERRELL, AMY   

## 2020-04-12 ENCOUNTER — Telehealth: Payer: Self-pay

## 2020-04-12 DIAGNOSIS — I2510 Atherosclerotic heart disease of native coronary artery without angina pectoris: Secondary | ICD-10-CM | POA: Diagnosis not present

## 2020-04-12 MED ORDER — ASPIRIN EC 81 MG PO TBEC: 81.0000 mg | DELAYED_RELEASE_TABLET | Freq: Every day | ORAL | 3 refills | Status: AC

## 2020-04-12 MED ORDER — ROSUVASTATIN CALCIUM 20 MG PO TABS: 20.0000 mg | ORAL_TABLET | Freq: Every day | ORAL | 3 refills | Status: DC

## 2020-04-12 NOTE — Telephone Encounter (Signed)
Spoke with patient regarding results and recommendation.  Patient verbalizes understanding and is agreeable to plan of care. Advised patient to call back with any issues or concerns.  

## 2020-04-12 NOTE — Telephone Encounter (Signed)
-----   Message from Thomasene Ripple, DO sent at 04/12/2020  1:40 PM EST ----- I need to see her within a week we need to plan for further testing-she needs cardiac catheterization her CT scan showed that she has coronary artery disease.  I like to start you on aspirin 81 mg daily, Crestor 20 mg daily.

## 2020-04-15 ENCOUNTER — Other Ambulatory Visit: Payer: Self-pay

## 2020-04-16 ENCOUNTER — Telehealth: Payer: Medicare PPO | Admitting: Cardiology

## 2020-04-22 ENCOUNTER — Encounter (HOSPITAL_COMMUNITY): Payer: Self-pay | Admitting: *Deleted

## 2020-04-22 ENCOUNTER — Inpatient Hospital Stay (HOSPITAL_COMMUNITY)
Admission: EM | Admit: 2020-04-22 | Discharge: 2020-04-26 | DRG: 065 | Disposition: A | Discharge status: Rehabilitation Facility | Payer: Medicare PPO | Attending: Internal Medicine | Admitting: Internal Medicine

## 2020-04-22 ENCOUNTER — Inpatient Hospital Stay (HOSPITAL_COMMUNITY): Payer: Medicare PPO

## 2020-04-22 ENCOUNTER — Emergency Department (HOSPITAL_COMMUNITY): Payer: Medicare PPO

## 2020-04-22 ENCOUNTER — Other Ambulatory Visit: Payer: Self-pay

## 2020-04-22 DIAGNOSIS — Z7952 Long term (current) use of systemic steroids: Secondary | ICD-10-CM

## 2020-04-22 DIAGNOSIS — I6340 Cerebral infarction due to embolism of unspecified cerebral artery: Secondary | ICD-10-CM | POA: Diagnosis not present

## 2020-04-22 DIAGNOSIS — E8809 Other disorders of plasma-protein metabolism, not elsewhere classified: Secondary | ICD-10-CM | POA: Diagnosis present

## 2020-04-22 DIAGNOSIS — G479 Sleep disorder, unspecified: Secondary | ICD-10-CM | POA: Diagnosis not present

## 2020-04-22 DIAGNOSIS — I69354 Hemiplegia and hemiparesis following cerebral infarction affecting left non-dominant side: Secondary | ICD-10-CM | POA: Diagnosis not present

## 2020-04-22 DIAGNOSIS — I6389 Other cerebral infarction: Secondary | ICD-10-CM | POA: Diagnosis not present

## 2020-04-22 DIAGNOSIS — R7989 Other specified abnormal findings of blood chemistry: Secondary | ICD-10-CM | POA: Diagnosis not present

## 2020-04-22 DIAGNOSIS — D72828 Other elevated white blood cell count: Secondary | ICD-10-CM | POA: Diagnosis present

## 2020-04-22 DIAGNOSIS — K922 Gastrointestinal hemorrhage, unspecified: Secondary | ICD-10-CM | POA: Diagnosis not present

## 2020-04-22 DIAGNOSIS — K921 Melena: Secondary | ICD-10-CM | POA: Diagnosis not present

## 2020-04-22 DIAGNOSIS — I10 Essential (primary) hypertension: Secondary | ICD-10-CM | POA: Diagnosis not present

## 2020-04-22 DIAGNOSIS — F419 Anxiety disorder, unspecified: Secondary | ICD-10-CM | POA: Diagnosis present

## 2020-04-22 DIAGNOSIS — Z7982 Long term (current) use of aspirin: Secondary | ICD-10-CM

## 2020-04-22 DIAGNOSIS — D72829 Elevated white blood cell count, unspecified: Secondary | ICD-10-CM | POA: Diagnosis not present

## 2020-04-22 DIAGNOSIS — Z20822 Contact with and (suspected) exposure to covid-19: Secondary | ICD-10-CM | POA: Diagnosis present

## 2020-04-22 DIAGNOSIS — R29810 Facial weakness: Secondary | ICD-10-CM | POA: Diagnosis present

## 2020-04-22 DIAGNOSIS — E785 Hyperlipidemia, unspecified: Secondary | ICD-10-CM | POA: Diagnosis present

## 2020-04-22 DIAGNOSIS — M47812 Spondylosis without myelopathy or radiculopathy, cervical region: Secondary | ICD-10-CM | POA: Diagnosis not present

## 2020-04-22 DIAGNOSIS — E039 Hypothyroidism, unspecified: Secondary | ICD-10-CM | POA: Diagnosis not present

## 2020-04-22 DIAGNOSIS — F32A Depression, unspecified: Secondary | ICD-10-CM | POA: Diagnosis present

## 2020-04-22 DIAGNOSIS — I2510 Atherosclerotic heart disease of native coronary artery without angina pectoris: Secondary | ICD-10-CM | POA: Diagnosis not present

## 2020-04-22 DIAGNOSIS — I5043 Acute on chronic combined systolic (congestive) and diastolic (congestive) heart failure: Secondary | ICD-10-CM | POA: Diagnosis present

## 2020-04-22 DIAGNOSIS — I110 Hypertensive heart disease with heart failure: Secondary | ICD-10-CM | POA: Diagnosis not present

## 2020-04-22 DIAGNOSIS — E1159 Type 2 diabetes mellitus with other circulatory complications: Secondary | ICD-10-CM | POA: Diagnosis not present

## 2020-04-22 DIAGNOSIS — Z96612 Presence of left artificial shoulder joint: Secondary | ICD-10-CM | POA: Diagnosis present

## 2020-04-22 DIAGNOSIS — R945 Abnormal results of liver function studies: Secondary | ICD-10-CM | POA: Diagnosis not present

## 2020-04-22 DIAGNOSIS — R7309 Other abnormal glucose: Secondary | ICD-10-CM | POA: Diagnosis not present

## 2020-04-22 DIAGNOSIS — Z7984 Long term (current) use of oral hypoglycemic drugs: Secondary | ICD-10-CM | POA: Diagnosis not present

## 2020-04-22 DIAGNOSIS — I63133 Cerebral infarction due to embolism of bilateral carotid arteries: Principal | ICD-10-CM | POA: Diagnosis present

## 2020-04-22 DIAGNOSIS — D6489 Other specified anemias: Secondary | ICD-10-CM | POA: Diagnosis present

## 2020-04-22 DIAGNOSIS — M059 Rheumatoid arthritis with rheumatoid factor, unspecified: Secondary | ICD-10-CM | POA: Diagnosis not present

## 2020-04-22 DIAGNOSIS — D509 Iron deficiency anemia, unspecified: Secondary | ICD-10-CM | POA: Diagnosis not present

## 2020-04-22 DIAGNOSIS — I255 Ischemic cardiomyopathy: Secondary | ICD-10-CM | POA: Diagnosis present

## 2020-04-22 DIAGNOSIS — Z87891 Personal history of nicotine dependence: Secondary | ICD-10-CM | POA: Diagnosis not present

## 2020-04-22 DIAGNOSIS — M069 Rheumatoid arthritis, unspecified: Secondary | ICD-10-CM | POA: Diagnosis not present

## 2020-04-22 DIAGNOSIS — E1165 Type 2 diabetes mellitus with hyperglycemia: Secondary | ICD-10-CM | POA: Diagnosis present

## 2020-04-22 DIAGNOSIS — I4719 Other supraventricular tachycardia: Secondary | ICD-10-CM | POA: Diagnosis present

## 2020-04-22 DIAGNOSIS — I471 Supraventricular tachycardia: Secondary | ICD-10-CM | POA: Diagnosis present

## 2020-04-22 DIAGNOSIS — E162 Hypoglycemia, unspecified: Secondary | ICD-10-CM | POA: Diagnosis not present

## 2020-04-22 DIAGNOSIS — E7800 Pure hypercholesterolemia, unspecified: Secondary | ICD-10-CM | POA: Diagnosis not present

## 2020-04-22 DIAGNOSIS — E119 Type 2 diabetes mellitus without complications: Secondary | ICD-10-CM

## 2020-04-22 DIAGNOSIS — R6510 Systemic inflammatory response syndrome (SIRS) of non-infectious origin without acute organ dysfunction: Secondary | ICD-10-CM | POA: Diagnosis not present

## 2020-04-22 DIAGNOSIS — E1169 Type 2 diabetes mellitus with other specified complication: Secondary | ICD-10-CM | POA: Diagnosis not present

## 2020-04-22 DIAGNOSIS — I639 Cerebral infarction, unspecified: Secondary | ICD-10-CM | POA: Diagnosis present

## 2020-04-22 DIAGNOSIS — I2583 Coronary atherosclerosis due to lipid rich plaque: Secondary | ICD-10-CM | POA: Diagnosis not present

## 2020-04-22 DIAGNOSIS — I420 Dilated cardiomyopathy: Secondary | ICD-10-CM | POA: Diagnosis not present

## 2020-04-22 DIAGNOSIS — R7401 Elevation of levels of liver transaminase levels: Secondary | ICD-10-CM

## 2020-04-22 DIAGNOSIS — R296 Repeated falls: Secondary | ICD-10-CM | POA: Diagnosis present

## 2020-04-22 DIAGNOSIS — Q211 Atrial septal defect: Secondary | ICD-10-CM

## 2020-04-22 DIAGNOSIS — Z79899 Other long term (current) drug therapy: Secondary | ICD-10-CM | POA: Diagnosis not present

## 2020-04-22 DIAGNOSIS — F418 Other specified anxiety disorders: Secondary | ICD-10-CM | POA: Diagnosis not present

## 2020-04-22 DIAGNOSIS — N390 Urinary tract infection, site not specified: Secondary | ICD-10-CM | POA: Diagnosis not present

## 2020-04-22 DIAGNOSIS — R29898 Other symptoms and signs involving the musculoskeletal system: Secondary | ICD-10-CM | POA: Diagnosis not present

## 2020-04-22 DIAGNOSIS — R000 Tachycardia, unspecified: Secondary | ICD-10-CM | POA: Diagnosis not present

## 2020-04-22 DIAGNOSIS — I340 Nonrheumatic mitral (valve) insufficiency: Secondary | ICD-10-CM | POA: Diagnosis not present

## 2020-04-22 DIAGNOSIS — I952 Hypotension due to drugs: Secondary | ICD-10-CM | POA: Diagnosis not present

## 2020-04-22 DIAGNOSIS — Z79890 Hormone replacement therapy (postmenopausal): Secondary | ICD-10-CM | POA: Diagnosis not present

## 2020-04-22 DIAGNOSIS — Z9049 Acquired absence of other specified parts of digestive tract: Secondary | ICD-10-CM

## 2020-04-22 DIAGNOSIS — Q2112 Patent foramen ovale: Secondary | ICD-10-CM

## 2020-04-22 DIAGNOSIS — M818 Other osteoporosis without current pathological fracture: Secondary | ICD-10-CM | POA: Diagnosis present

## 2020-04-22 DIAGNOSIS — I63439 Cerebral infarction due to embolism of unspecified posterior cerebral artery: Secondary | ICD-10-CM | POA: Diagnosis not present

## 2020-04-22 DIAGNOSIS — R531 Weakness: Secondary | ICD-10-CM | POA: Diagnosis not present

## 2020-04-22 DIAGNOSIS — G8194 Hemiplegia, unspecified affecting left nondominant side: Secondary | ICD-10-CM | POA: Diagnosis present

## 2020-04-22 DIAGNOSIS — M4802 Spinal stenosis, cervical region: Secondary | ICD-10-CM | POA: Diagnosis not present

## 2020-04-22 DIAGNOSIS — R4781 Slurred speech: Secondary | ICD-10-CM | POA: Diagnosis not present

## 2020-04-22 DIAGNOSIS — I083 Combined rheumatic disorders of mitral, aortic and tricuspid valves: Secondary | ICD-10-CM | POA: Diagnosis not present

## 2020-04-22 DIAGNOSIS — K5909 Other constipation: Secondary | ICD-10-CM | POA: Diagnosis present

## 2020-04-22 DIAGNOSIS — R0602 Shortness of breath: Secondary | ICD-10-CM | POA: Diagnosis not present

## 2020-04-22 DIAGNOSIS — E875 Hyperkalemia: Secondary | ICD-10-CM | POA: Diagnosis not present

## 2020-04-22 DIAGNOSIS — E871 Hypo-osmolality and hyponatremia: Secondary | ICD-10-CM | POA: Diagnosis not present

## 2020-04-22 DIAGNOSIS — F05 Delirium due to known physiological condition: Secondary | ICD-10-CM | POA: Diagnosis not present

## 2020-04-22 DIAGNOSIS — E876 Hypokalemia: Secondary | ICD-10-CM | POA: Diagnosis present

## 2020-04-22 DIAGNOSIS — R29818 Other symptoms and signs involving the nervous system: Secondary | ICD-10-CM | POA: Diagnosis not present

## 2020-04-22 DIAGNOSIS — I5042 Chronic combined systolic (congestive) and diastolic (congestive) heart failure: Secondary | ICD-10-CM | POA: Diagnosis not present

## 2020-04-22 DIAGNOSIS — I63511 Cerebral infarction due to unspecified occlusion or stenosis of right middle cerebral artery: Secondary | ICD-10-CM | POA: Diagnosis not present

## 2020-04-22 DIAGNOSIS — I480 Paroxysmal atrial fibrillation: Secondary | ICD-10-CM | POA: Diagnosis present

## 2020-04-22 LAB — DIFFERENTIAL
Abs Immature Granulocytes: 0.1 10*3/uL — ABNORMAL HIGH (ref 0.00–0.07)
Basophils Absolute: 0 10*3/uL (ref 0.0–0.1)
Basophils Relative: 0 %
Eosinophils Absolute: 0 10*3/uL (ref 0.0–0.5)
Eosinophils Relative: 0 %
Immature Granulocytes: 1 %
Lymphocytes Relative: 10 %
Lymphs Abs: 1.4 10*3/uL (ref 0.7–4.0)
Monocytes Absolute: 1.1 10*3/uL — ABNORMAL HIGH (ref 0.1–1.0)
Monocytes Relative: 8 %
Neutro Abs: 10.6 10*3/uL — ABNORMAL HIGH (ref 1.7–7.7)
Neutrophils Relative %: 81 %

## 2020-04-22 LAB — SARS CORONAVIRUS 2 BY RT PCR (HOSPITAL ORDER, PERFORMED IN ~~LOC~~ HOSPITAL LAB): SARS Coronavirus 2: NEGATIVE

## 2020-04-22 LAB — COMPREHENSIVE METABOLIC PANEL
ALT: 62 U/L — ABNORMAL HIGH (ref 0–44)
AST: 65 U/L — ABNORMAL HIGH (ref 15–41)
Albumin: 2.7 g/dL — ABNORMAL LOW (ref 3.5–5.0)
Alkaline Phosphatase: 162 U/L — ABNORMAL HIGH (ref 38–126)
Anion gap: 11 (ref 5–15)
BUN: 15 mg/dL (ref 6–20)
CO2: 28 mmol/L (ref 22–32)
Calcium: 8.6 mg/dL — ABNORMAL LOW (ref 8.9–10.3)
Chloride: 98 mmol/L (ref 98–111)
Creatinine, Ser: 0.59 mg/dL (ref 0.44–1.00)
GFR, Estimated: >60 mL/min (ref >60)
Glucose, Bld: 173 mg/dL — ABNORMAL HIGH (ref 70–99)
Potassium: 3.7 mmol/L (ref 3.5–5.1)
Sodium: 137 mmol/L (ref 135–145)
Total Bilirubin: 0.9 mg/dL (ref 0.3–1.2)
Total Protein: 5.3 g/dL — ABNORMAL LOW (ref 6.5–8.1)

## 2020-04-22 LAB — SEDIMENTATION RATE: Sed Rate: 3 mm/hr (ref 0–22)

## 2020-04-22 LAB — PROTIME-INR
INR: 1.3 — ABNORMAL HIGH (ref 0.8–1.2)
Prothrombin Time: 15.9 seconds — ABNORMAL HIGH (ref 11.4–15.2)

## 2020-04-22 LAB — LIPID PANEL
Cholesterol: 159 mg/dL (ref 0–200)
HDL: 49 mg/dL (ref >40)
LDL Cholesterol: 88 mg/dL (ref 0–99)
Total CHOL/HDL Ratio: 3.2 RATIO
Triglycerides: 111 mg/dL (ref <150)
VLDL: 22 mg/dL (ref 0–40)

## 2020-04-22 LAB — CBG MONITORING, ED: Glucose-Capillary: 144 mg/dL — ABNORMAL HIGH (ref 70–99)

## 2020-04-22 LAB — CBC
HCT: 49 % — ABNORMAL HIGH (ref 36.0–46.0)
Hemoglobin: 14.9 g/dL (ref 12.0–15.0)
MCH: 30.7 pg (ref 26.0–34.0)
MCHC: 30.4 g/dL (ref 30.0–36.0)
MCV: 100.8 fL — ABNORMAL HIGH (ref 80.0–100.0)
Platelets: 342 10*3/uL (ref 150–400)
RBC: 4.86 MIL/uL (ref 3.87–5.11)
RDW: 16.9 % — ABNORMAL HIGH (ref 11.5–15.5)
WBC: 13.2 10*3/uL — ABNORMAL HIGH (ref 4.0–10.5)
nRBC: 0 % (ref 0.0–0.2)

## 2020-04-22 LAB — TSH: TSH: 0.74 u[IU]/mL (ref 0.350–4.500)

## 2020-04-22 LAB — HEMOGLOBIN A1C
Hgb A1c MFr Bld: 6.1 % — ABNORMAL HIGH (ref 4.8–5.6)
Mean Plasma Glucose: 128.37 mg/dL

## 2020-04-22 LAB — ETHANOL: Alcohol, Ethyl (B): <10 mg/dL (ref <10)

## 2020-04-22 LAB — APTT: aPTT: 21 seconds — ABNORMAL LOW (ref 24–36)

## 2020-04-22 LAB — CK: Total CK: 87 U/L (ref 38–234)

## 2020-04-22 LAB — HIV ANTIBODY (ROUTINE TESTING W REFLEX): HIV Screen 4th Generation wRfx: NONREACTIVE

## 2020-04-22 IMAGING — MR MR HEAD W/O CM
8 series · 48 of 48 positions shown · non-contrast
Comparison: Head CT same day.  MRI [DATE].

CLINICAL DATA: Acute neurological deficit. Progressive weakness,
worse on the left.

EXAM:
MRI HEAD WITHOUT CONTRAST
TECHNIQUE: Multiplanar, multiecho pulse sequences of the brain and surrounding
structures were obtained without intravenous contrast.

[Series 5: DWI · axial · 3.0mm · 0.88mm/px · z∈[-138,+8]mm · 12 of 100 slices shown (1 of 4)]
[im 1/100]
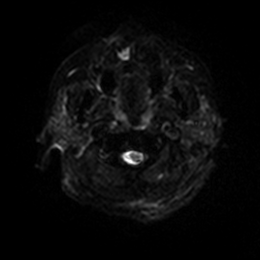
[im 10/100]
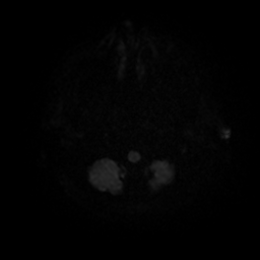
[im 19/100]
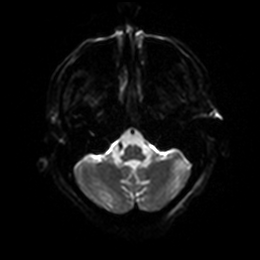
[im 28/100]
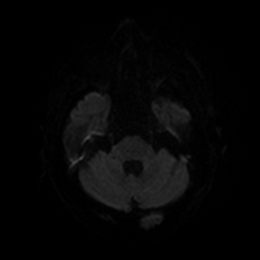
[im 37/100]
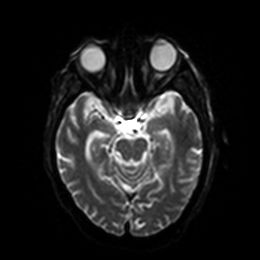
[im 46/100]
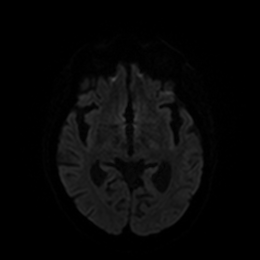
[im 55/100]
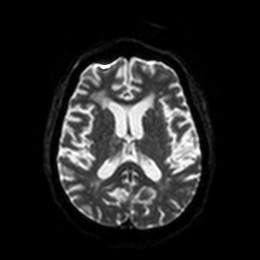
[im 64/100]
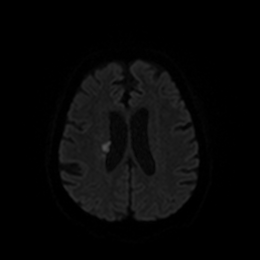
[im 73/100]
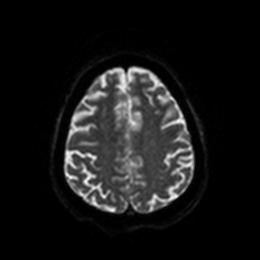
[im 82/100]
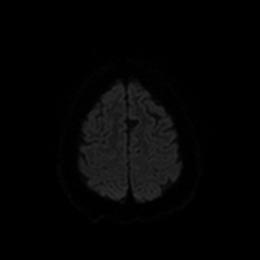
[im 91/100]
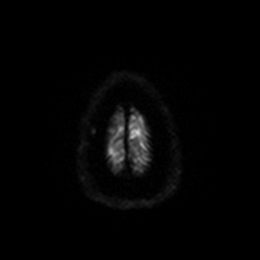
[im 100/100]
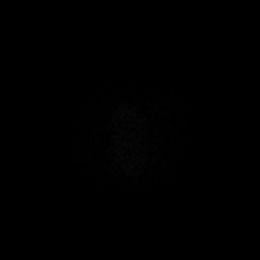

[Series 6: DWI · axial · 3.0mm · 0.88mm/px · z∈[-138,+8]mm · 6 of 50 slices shown (2 of 4)]
[im 1/50]
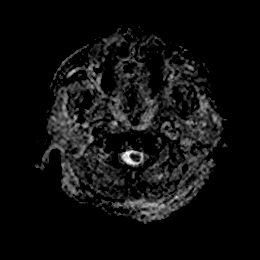
[im 10/50]
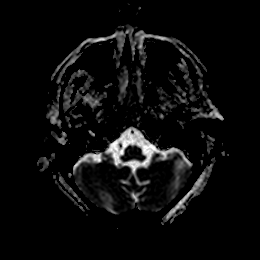
[im 20/50]
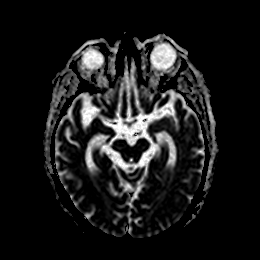
[im 30/50]
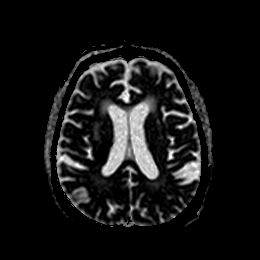
[im 40/50]
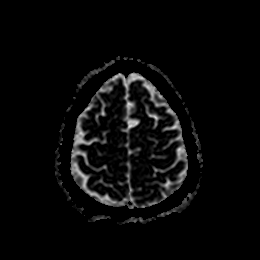
[im 50/50]
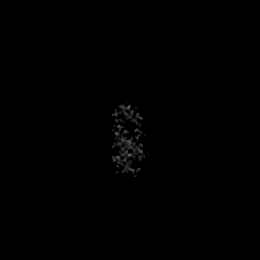

[Series 7: DWI · coronal · 4.0mm · 0.88mm/px · 8 of 72 slices shown (3 of 4)]
[im 1/72]
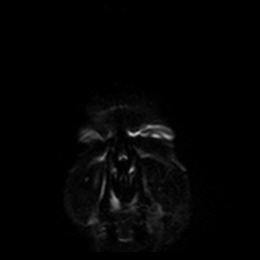
[im 11/72]
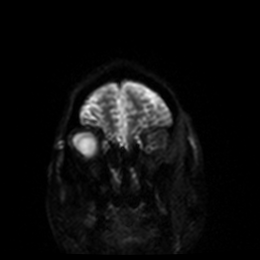
[im 21/72]
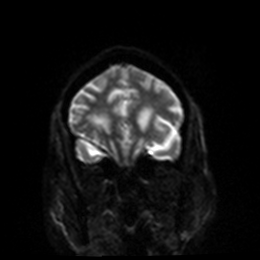
[im 31/72]
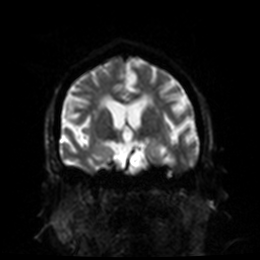
[im 41/72]
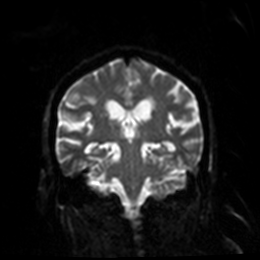
[im 51/72]
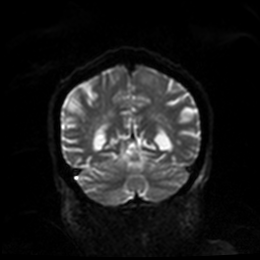
[im 61/72]
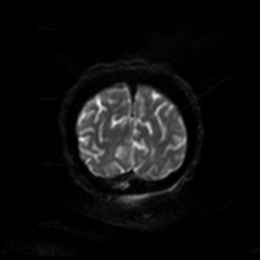
[im 72/72]
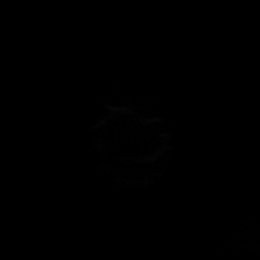

[Series 8: DWI · coronal · 4.0mm · 0.88mm/px · 4 of 36 slices shown (4 of 4)]
[im 1/36]
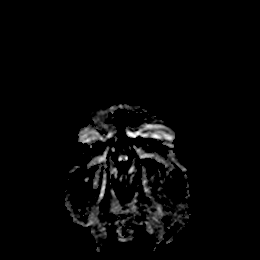
[im 12/36]
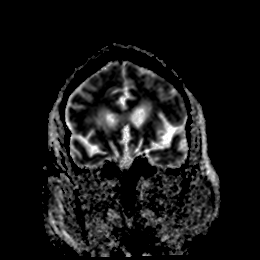
[im 24/36]
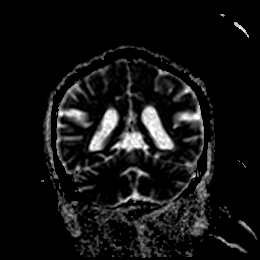
[im 36/36]
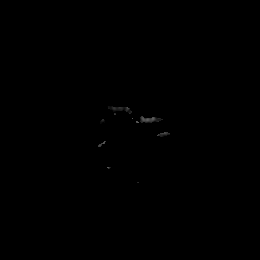

[Series 9: FLAIR · axial · 5.0mm · 0.45mm/px · z∈[-135,+8]mm · 3 of 25 slices shown]
[im 1/25]
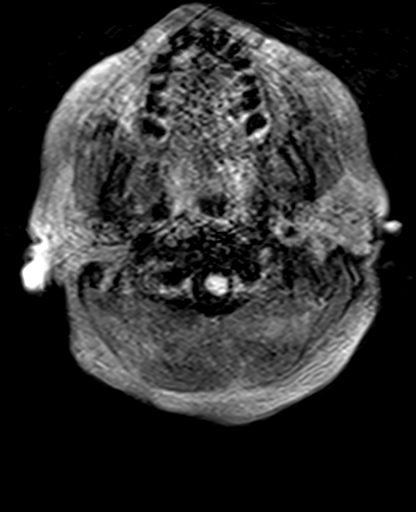
[im 13/25]
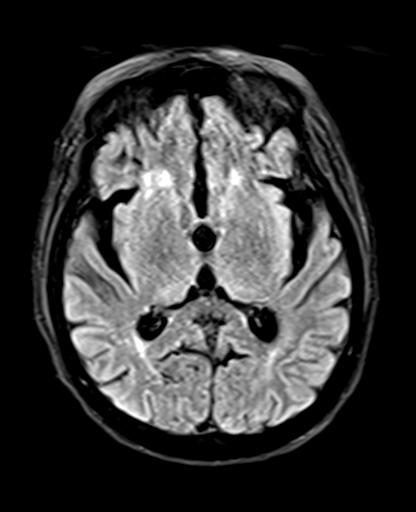
[im 25/25]
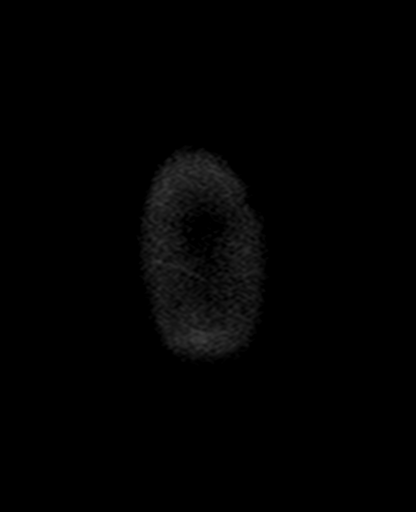

[Series 10: T2 · axial · 5.0mm · 0.72mm/px · z∈[-133,+10]mm · 3 of 25 slices shown]
[im 1/25]
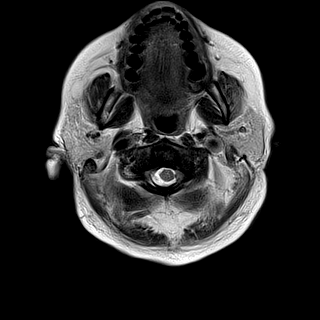
[im 13/25]
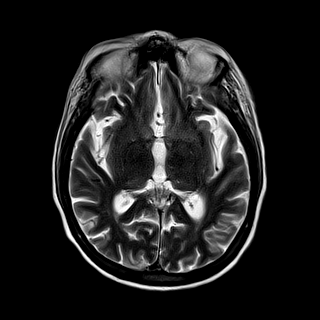
[im 25/25]
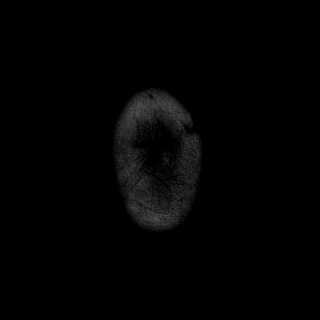

[Series 12: pha_images · axial · 3.0mm · 0.90mm/px · z∈[-139,+3]mm · 6 of 49 slices shown]
[im 1/49]
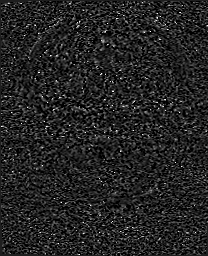
[im 10/49]
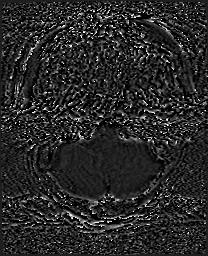
[im 20/49]
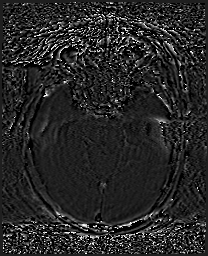
[im 29/49]
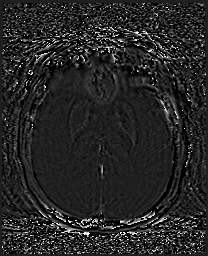
[im 39/49]
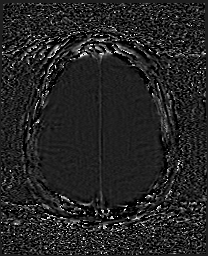
[im 49/49]
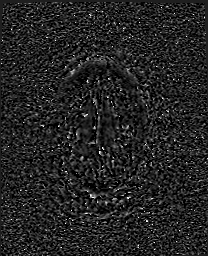

[Series 13: swi_images · axial · 3.0mm · 0.90mm/px · z∈[-139,+12]mm · 6 of 52 slices shown]
[im 1/52]
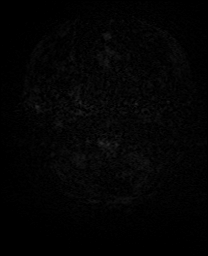
[im 11/52]
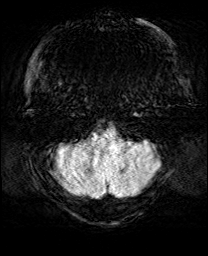
[im 21/52]
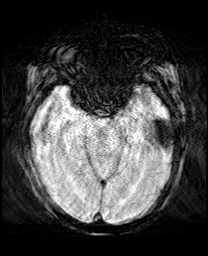
[im 31/52]
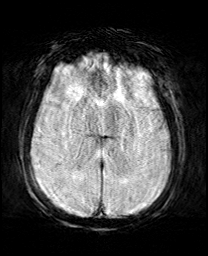
[im 41/52]
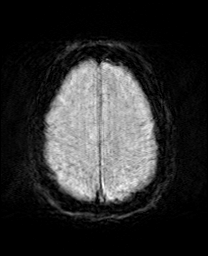
[im 52/52]
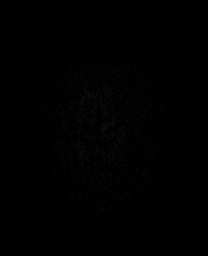

[48 of 48 positions shown; findings below may reference images not displayed]

FINDINGS: Brain: Diffusion imaging shows acute infarction affecting the right
mid-posterior caudate an the superior thalamus. Single separate
punctate acute infarction in the cortex of the medial right
parietooccipital junction. Few small punctate acute infarctions in
the right occipital cortex. Findings consistent with posterior
circulation embolic disease.

Elsewhere, there is chronic small vessel ischemic change affecting
the hemispheric deep and subcortical white matter, right more than
left. No focal insult affects the brainstem or cerebellum. No
evidence of mass, hemorrhage, hydrocephalus or extra-axial
collection

Vascular: Major vessels at the base of the brain show flow.

Skull and upper cervical spine: Negative

Sinuses/Orbits: Clear/normal

Other: None
IMPRESSION: 1. Acute infarction affecting the right mid-posterior caudate and
right superior thalamus. Single separate punctate acute infarction
in the cortex of the medial right parietooccipital junction. Few
small punctate acute infarctions in the right occipital cortex.
Findings consistent with posterior circulation embolic disease. No
evidence of hemorrhage or mass effect.
2. Chronic small-vessel ischemic changes elsewhere throughout the
brain as outlined above.

## 2020-04-22 IMAGING — DX DG CHEST 1V PORT
1 series · 1 of 1 positions shown · non-contrast
Comparison: Portable exam [P5] hours compared to [DATE]

CLINICAL DATA: Weakness

EXAM:
PORTABLE CHEST 1 VIEW

[chest ap]
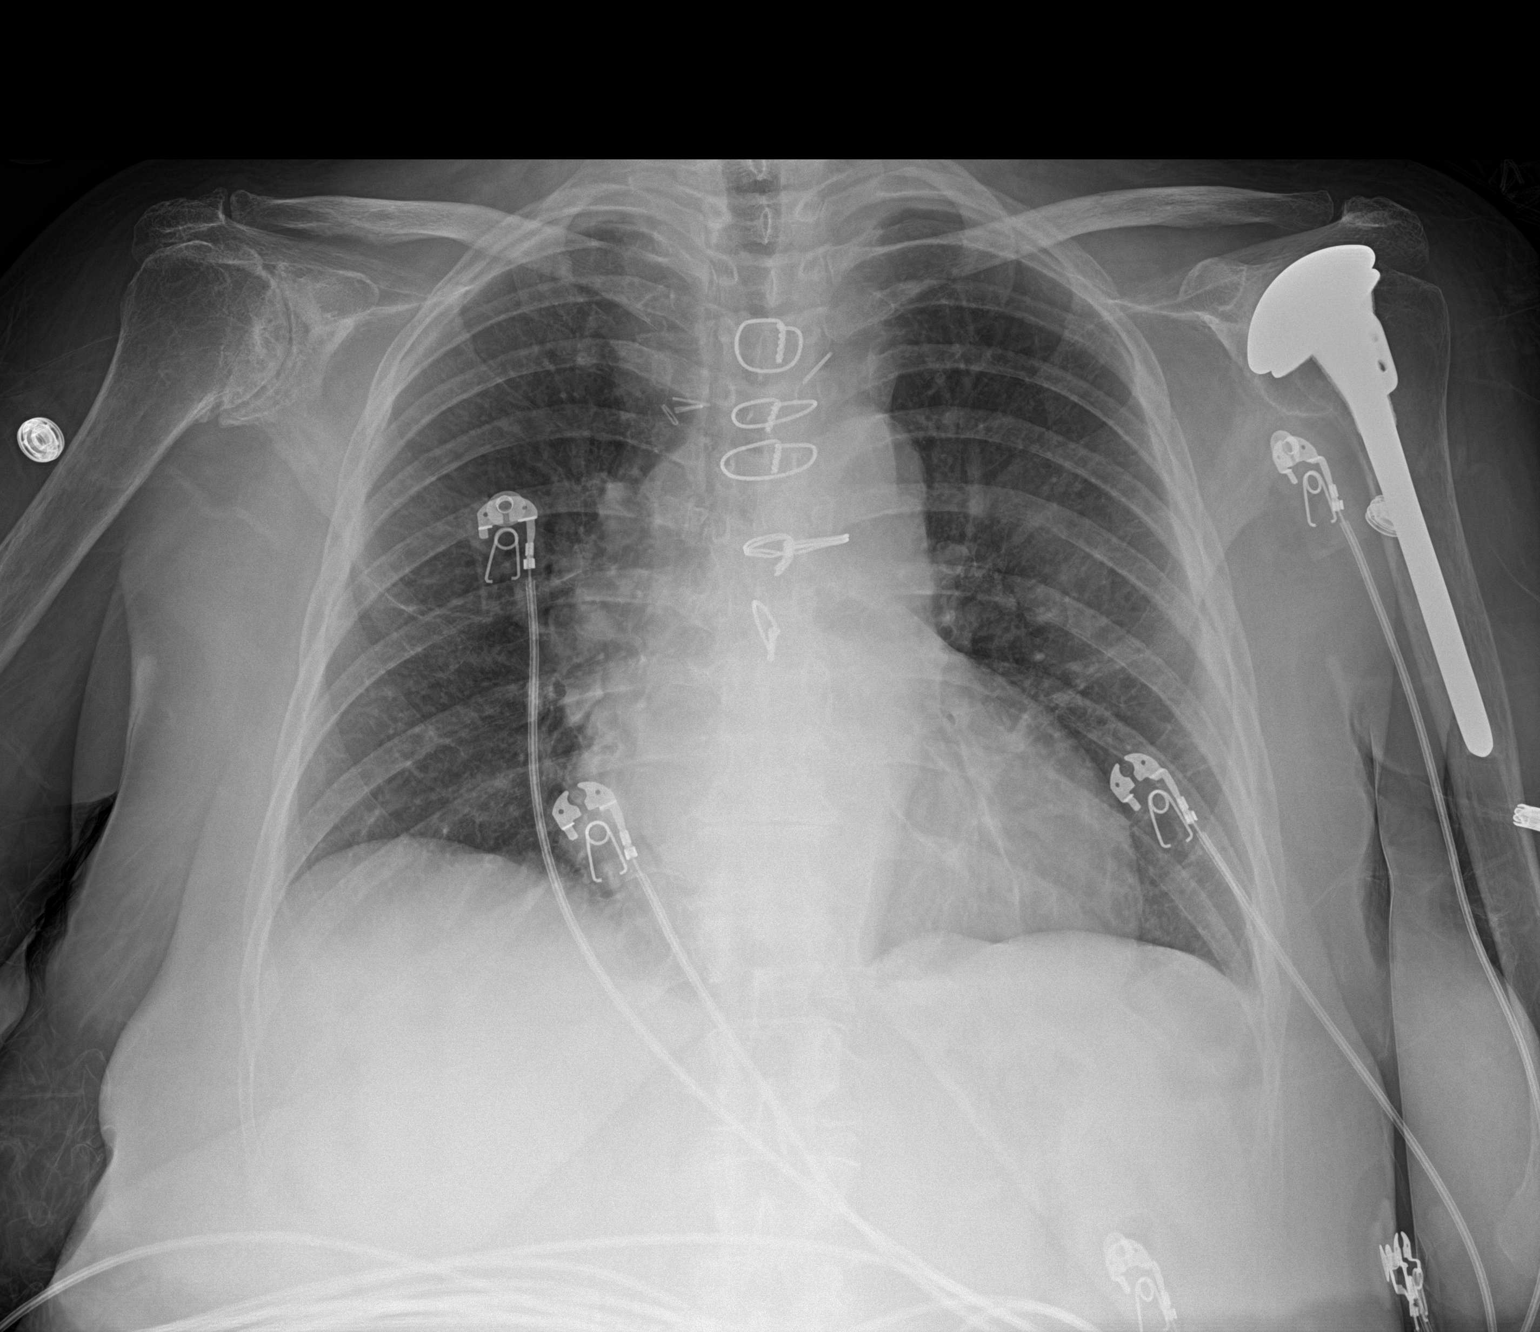

[1 of 1 positions shown; findings below may reference images not displayed]

FINDINGS: Upper normal heart size post median sternotomy.

Mediastinal contours and pulmonary vascularity normal.

Subsegmental atelectasis versus scarring at LEFT base.

Remaining lungs clear.

No acute infiltrate, pleural effusion, or pneumothorax.

Bones demineralized with note of a LEFT shoulder prosthesis and
advanced RIGHT glenohumeral degenerative changes.
IMPRESSION: Atelectasis versus scarring at LEFT base.

## 2020-04-22 IMAGING — CT CT HEAD W/O CM
3 series · 15 of 47 positions shown, 18 images · non-contrast
Comparison: [DATE]

CLINICAL DATA: Tube deficit. Progressive weakness, greater on the
left. Fell today.

EXAM:
CT HEAD WITHOUT CONTRAST
TECHNIQUE: Contiguous axial images were obtained from the base of the skull
through the vertex without intravenous contrast.

[Series 3: head 5.0 h30s · axial · 0.45mm/px · z∈[-118,+22]mm · 9 of 34 slices shown, 12 images]
[im 3/34  brain]
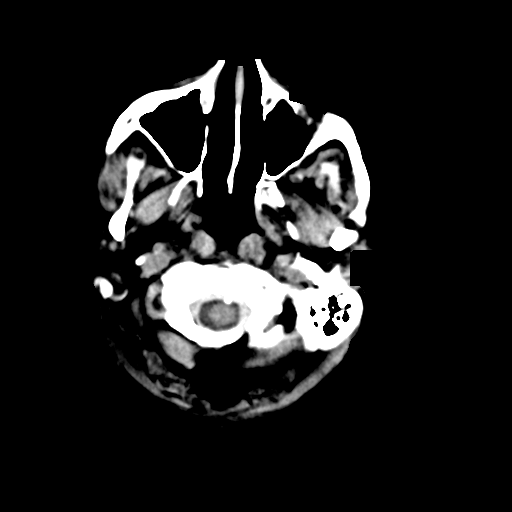
[im 3/34  bone]
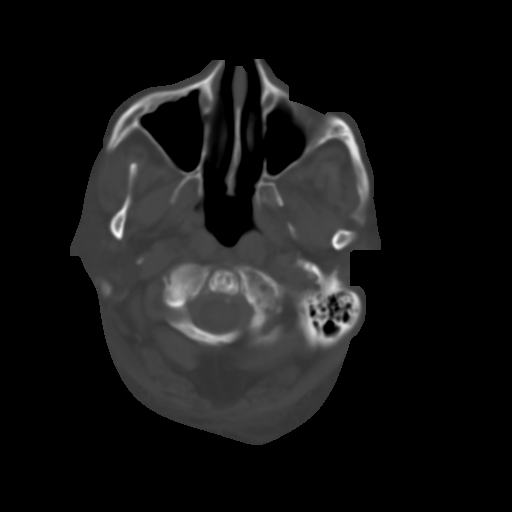
[im 6/34  brain]
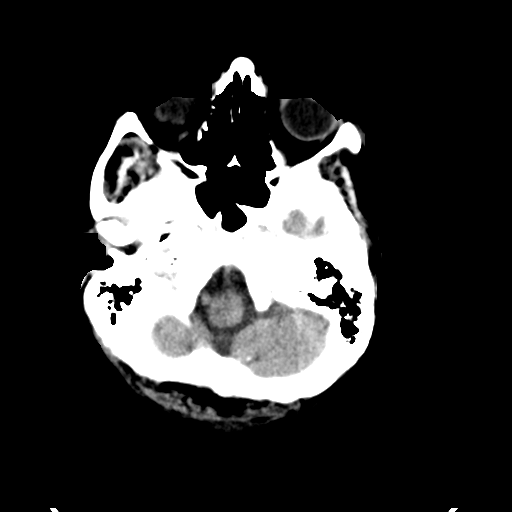
[im 10/34  brain]
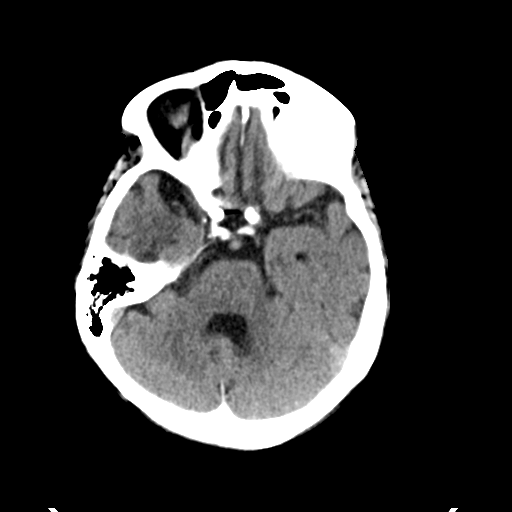
[im 13/34  brain]
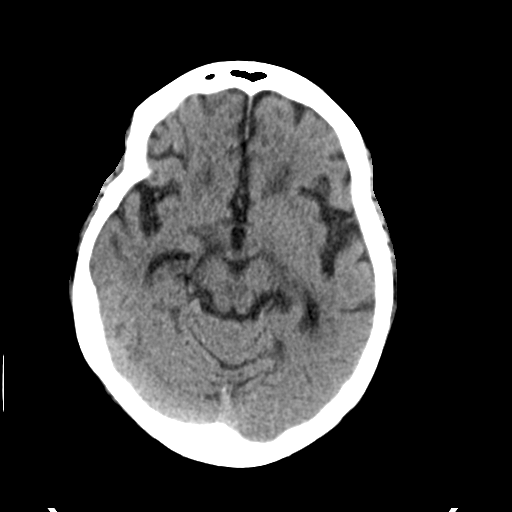
[im 18/34  brain]
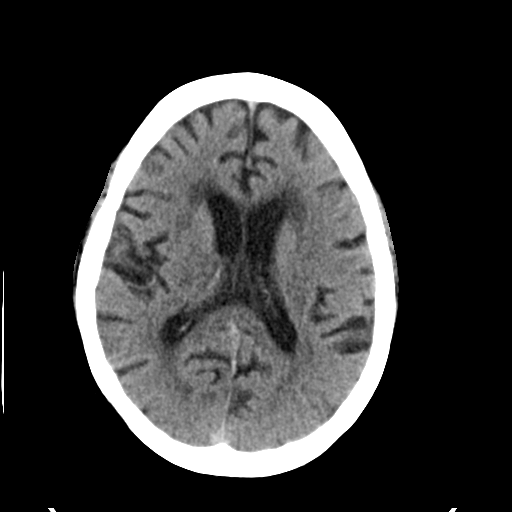
[im 18/34  bone]
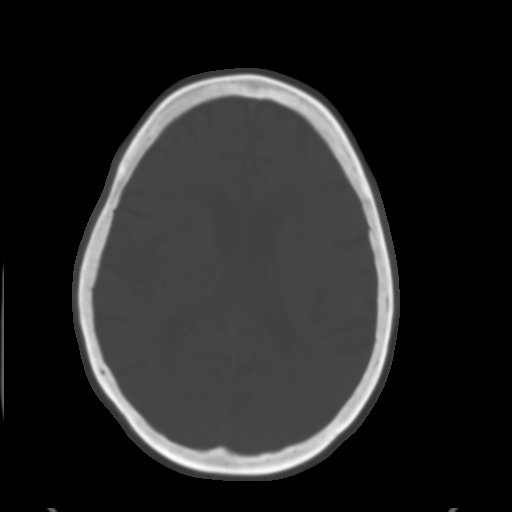
[im 21/34  brain]
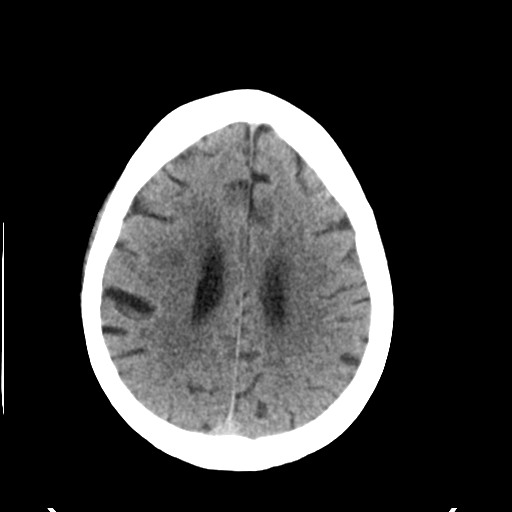
[im 24/34  brain]
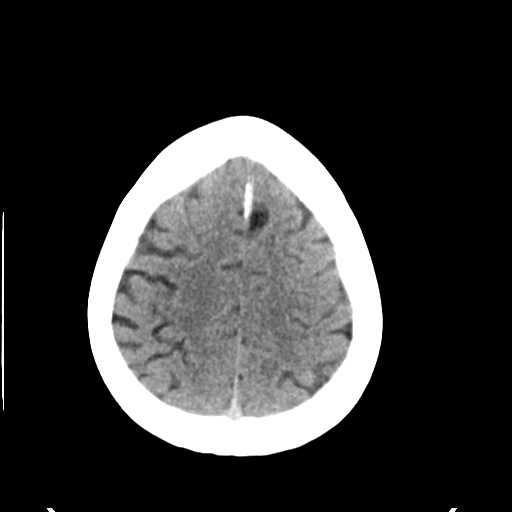
[im 28/34  brain]
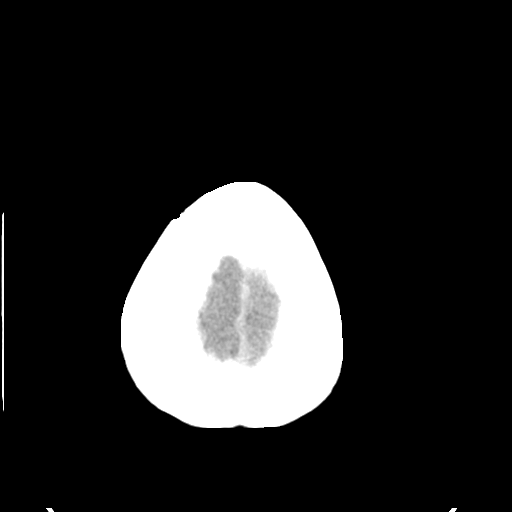
[im 31/34  brain]
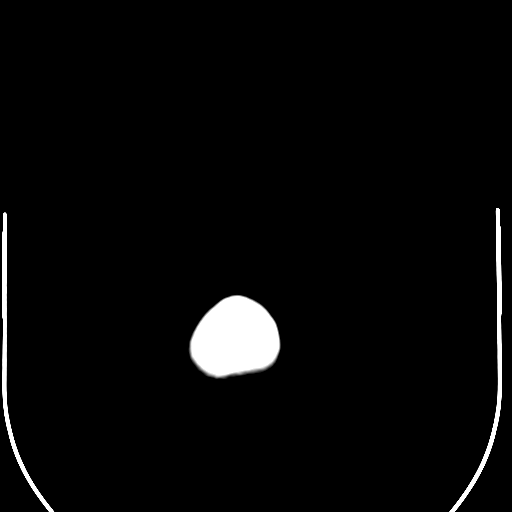
[im 31/34  bone]
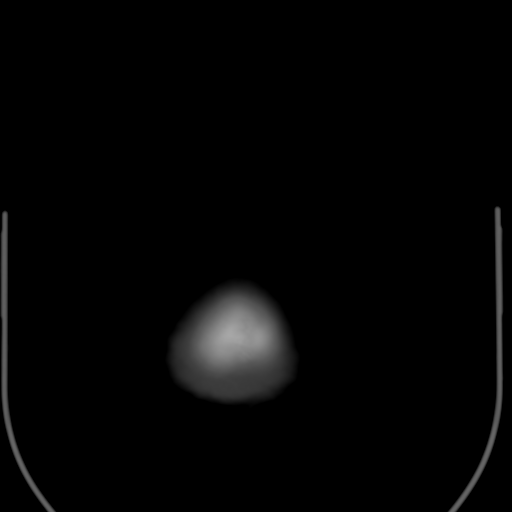

[Series 5: head 3.0 mpr cor · coronal · 0.32mm/px · 3 of 72 slices shown]
[im 24/72  brain]
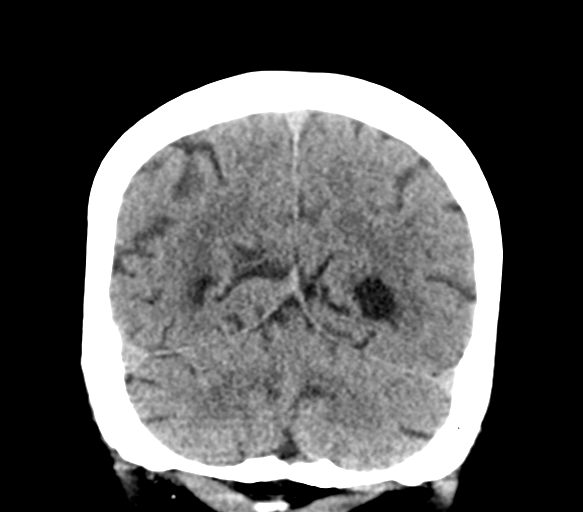
[im 32/72  brain]
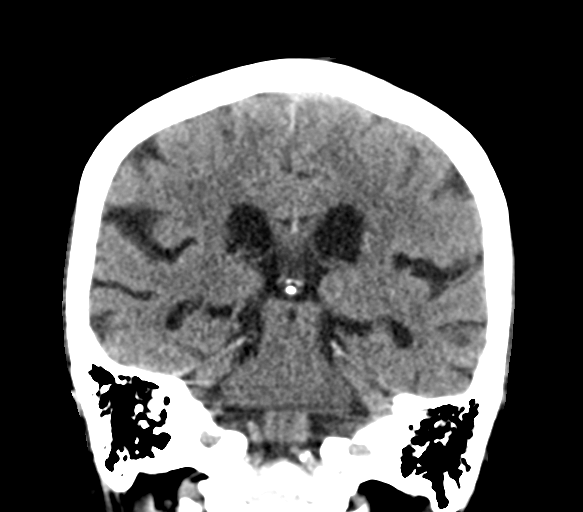
[im 40/72  brain]
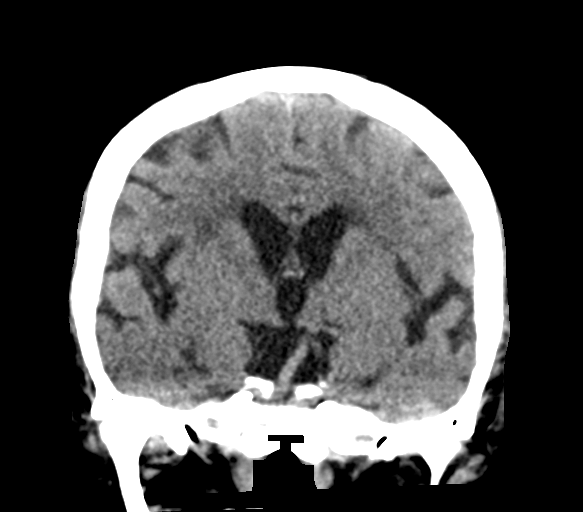

[Series 6: head 3.0 mpr sag · sagittal · 0.32mm/px · 3 of 62 slices shown]
[im 21/62  brain]
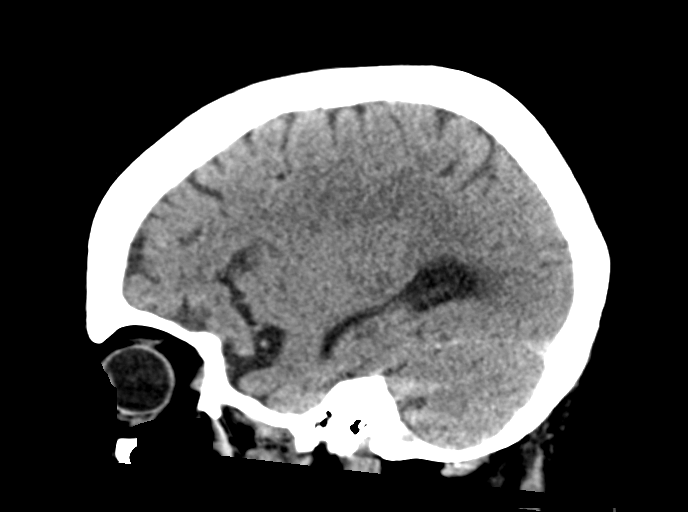
[im 31/62  brain]
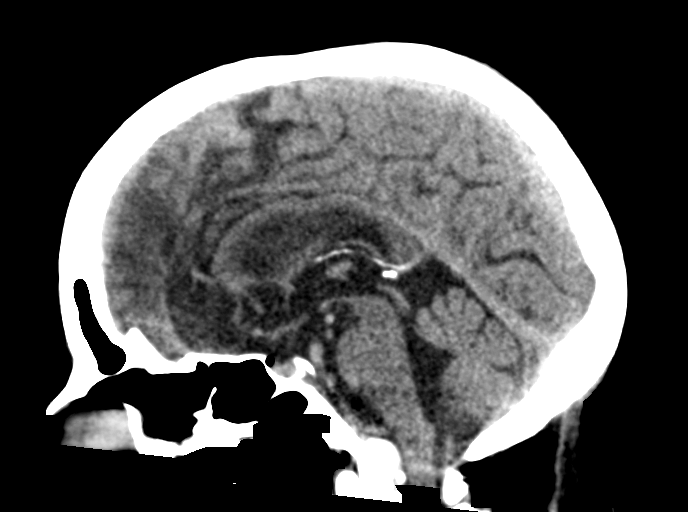
[im 41/62  brain]
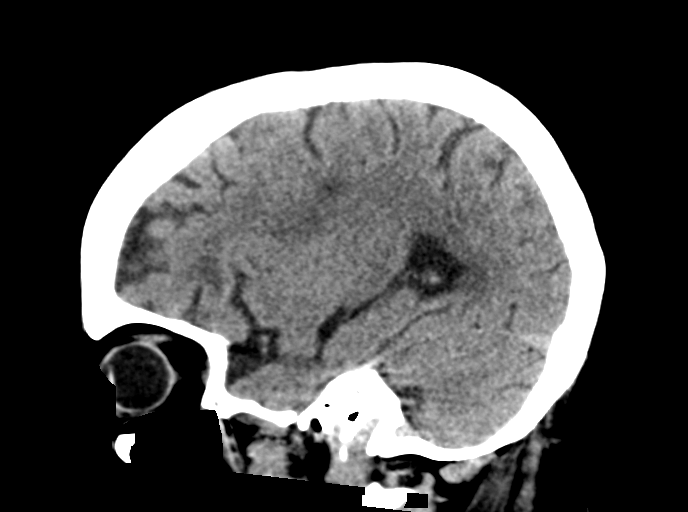

[15 of 47 positions shown; findings below may reference images not displayed]

FINDINGS: Brain: No evidence of acute infarction, hemorrhage, hydrocephalus,
extra-axial collection or mass lesion/mass effect.

Subcortical and periventricular white matter hypodensity most likely
result of chronic ischemic small vessel disease. Similar findings
can be seen with demyelinating disease of the white matter. Diffuse
cortical atrophy again seen.

Vascular: No hyperdense vessel or unexpected calcification.

Skull: Normal. Negative for fracture or focal lesion.

Sinuses/Orbits: No acute finding.

Other: None.
IMPRESSION: No acute intracranial abnormality.

## 2020-04-22 IMAGING — CT CT ANGIO NECK
2 of 7 series · 8 of 33 positions shown · IV contrast (APPLIED)
Comparison: Brain MRI [DATE]

Head CT [DATE]

CLINICAL DATA: Worsening left-sided weakness for 2 weeks. Recent
TIA. Fall today. Slurred speech.

EXAM:
CT ANGIOGRAPHY HEAD AND NECK
TECHNIQUE: Multidetector CT imaging of the head and neck was performed using
the standard protocol during bolus administration of intravenous
contrast. Multiplanar CT image reconstructions and MIPs were
obtained to evaluate the vascular anatomy. Carotid stenosis
measurements (when applicable) are obtained utilizing NASCET
criteria, using the distal internal carotid diameter as the
denominator.
CONTRAST:  75mL OMNIPAQUE IOHEXOL 350 MG/ML SOLN

[Series 5: cta neck/head (person_name) · axial · 0.54mm/px · z∈[+1146,+1254]mm · 2 of 163 slices shown]
[im 55/163  soft-tissue]
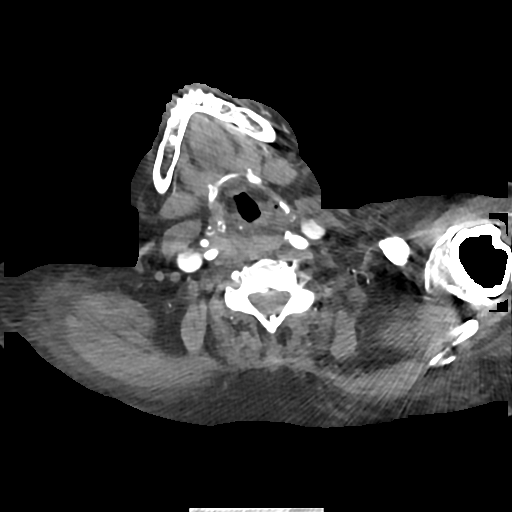
[im 109/163  soft-tissue]
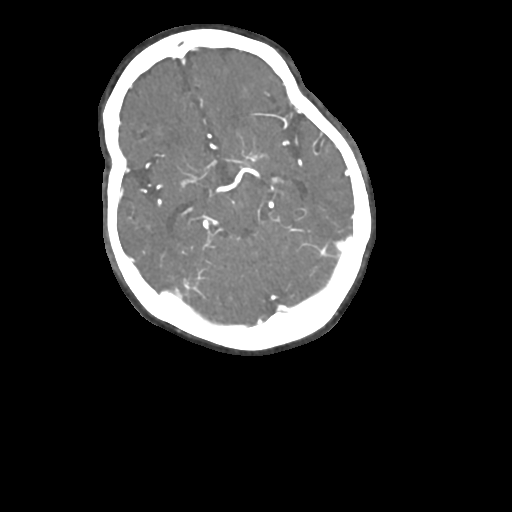

[Series 7: ax thins · axial · 0.39mm/px · z∈[+1083,+1313]mm · 6 of 322 slices shown]
[im 46/322  soft-tissue]
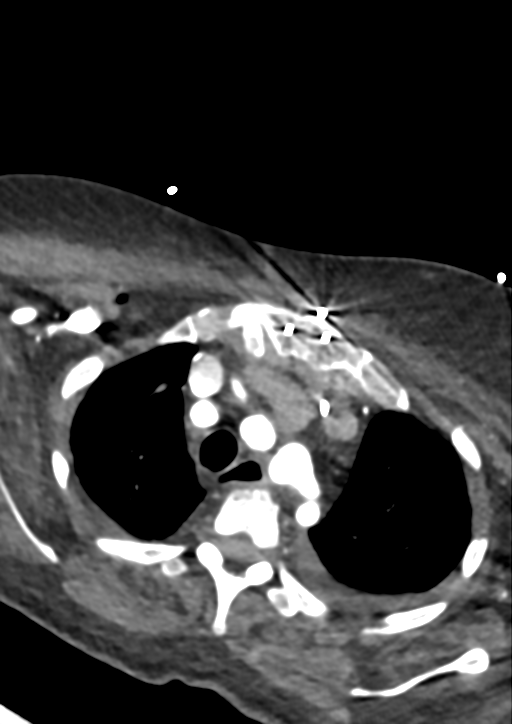
[im 92/322  bone]
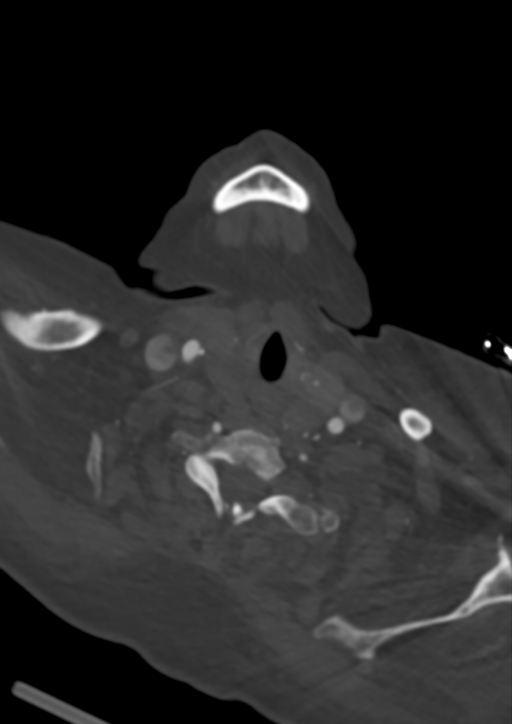
[im 138/322  soft-tissue]
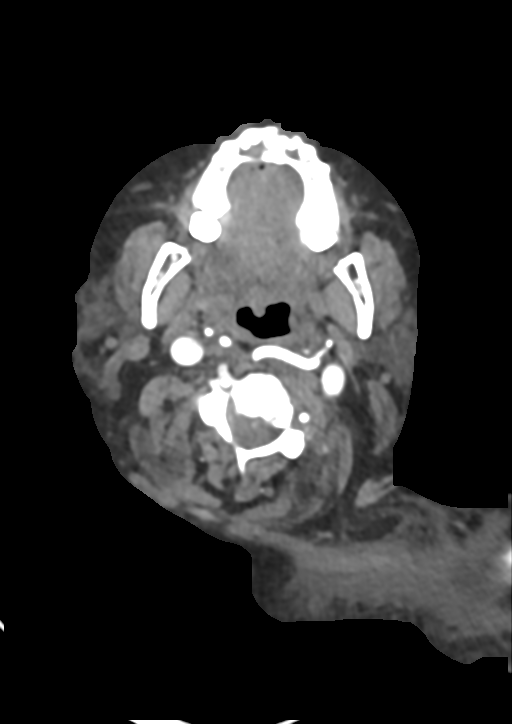
[im 184/322  bone]
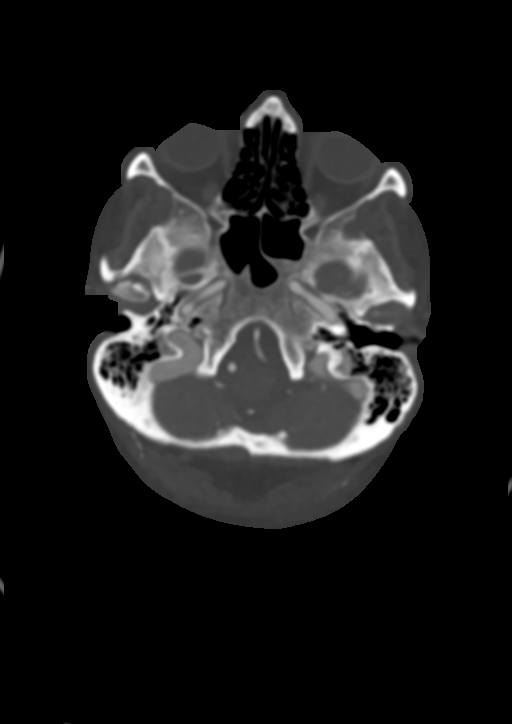
[im 230/322  soft-tissue]
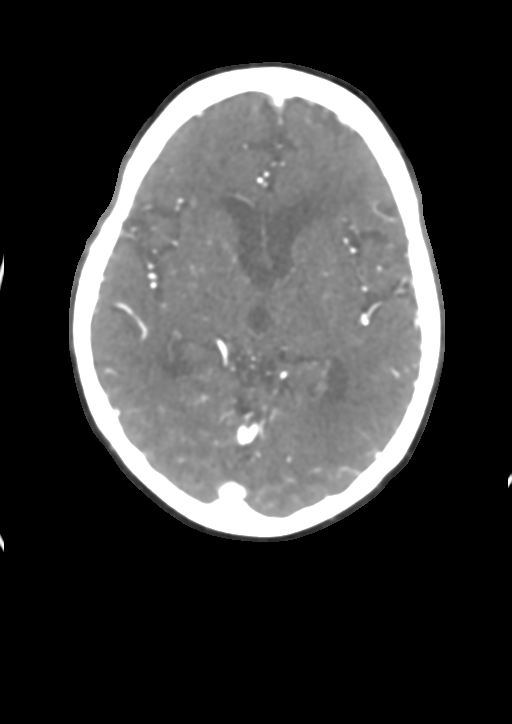
[im 276/322  bone]
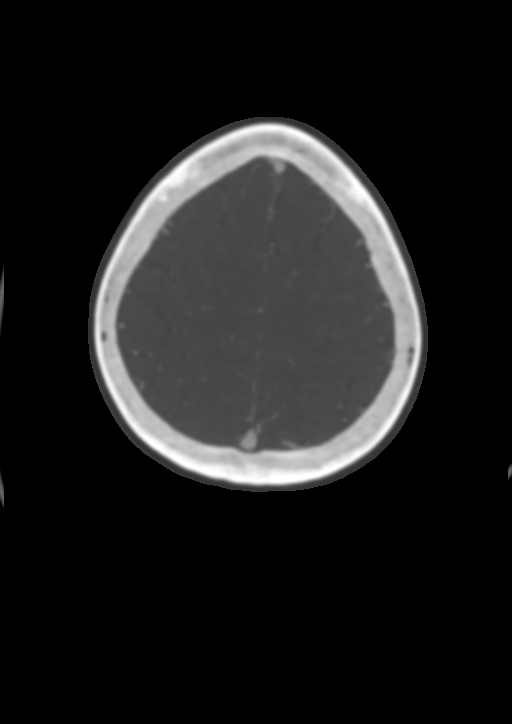

[8 of 33 positions shown; findings below may reference images not displayed]

FINDINGS: CTA NECK FINDINGS

SKELETON: There is no bony spinal canal stenosis. No lytic or
blastic lesion.

OTHER NECK: Normal pharynx, larynx and major salivary glands. No
cervical lymphadenopathy. Enlarged thyroid gland.

UPPER CHEST: Small left pleural effusion with fluid in the fissure.

AORTIC ARCH:

There is no calcific atherosclerosis of the aortic arch. There is no
aneurysm, dissection or hemodynamically significant stenosis of the
visualized portion of the aorta. Conventional 3 vessel aortic
branching pattern. The visualized proximal subclavian arteries are
widely patent.

RIGHT CAROTID SYSTEM: Normal without aneurysm, dissection or
stenosis.

LEFT CAROTID SYSTEM: No dissection, occlusion or aneurysm. Mild
atherosclerotic calcification at the carotid bifurcation without
hemodynamically significant stenosis.

VERTEBRAL ARTERIES: Codominant configuration. Both origins are
clearly patent. There is no dissection, occlusion or flow-limiting
stenosis to the skull base (V1-V3 segments).

CTA HEAD FINDINGS

POSTERIOR CIRCULATION:

--Vertebral arteries: Normal V4 segments.

--Inferior cerebellar arteries: Normal.

--Basilar artery: Normal.

--Superior cerebellar arteries: Normal.

--Posterior cerebral arteries (PCA): Severe stenosis of the right
PCA P3 segment over a length 7 mm ([DATE]). The distal right PCA is
normal. Mild stenosis of the left P3 segment in a similar location

ANTERIOR CIRCULATION:

--Intracranial internal carotid arteries: Normal.

--Anterior cerebral arteries (ACA): Normal. Hypoplastic right A1
segment, normal variant.

--Middle cerebral arteries (MCA): Normal.

VENOUS SINUSES: As permitted by contrast timing, patent.
Incidentally noted gas in the cavernous sinuses related to contrast
injection.

ANATOMIC VARIANTS: None

Review of the MIP images confirms the above findings.
IMPRESSION: 1. No emergent large vessel occlusion.
2. Severe stenosis of the right PCA P3 segment over a length of 7
mm.
3. Mild stenosis of the left PCA P3 segment.
4. Small left pleural effusion with fluid in the fissure.
5. Enlarged thyroid gland. This has been evaluated on previous
imaging. (ref: [HOSPITAL]. [DATE]): 143-50).

## 2020-04-22 IMAGING — CT CT ANGIO HEAD
2 of 7 series · 8 of 33 positions shown · IV contrast (APPLIED)
Comparison: Brain MRI [DATE]

Head CT [DATE]

CLINICAL DATA: Worsening left-sided weakness for 2 weeks. Recent
TIA. Fall today. Slurred speech.

EXAM:
CT ANGIOGRAPHY HEAD AND NECK
TECHNIQUE: Multidetector CT imaging of the head and neck was performed using
the standard protocol during bolus administration of intravenous
contrast. Multiplanar CT image reconstructions and MIPs were
obtained to evaluate the vascular anatomy. Carotid stenosis
measurements (when applicable) are obtained utilizing NASCET
criteria, using the distal internal carotid diameter as the
denominator.
CONTRAST:  75mL OMNIPAQUE IOHEXOL 350 MG/ML SOLN

[Series 5: cta neck/head (person_name) · axial · 0.54mm/px · z∈[+1146,+1254]mm · 2 of 163 slices shown]
[im 55/163  soft-tissue]
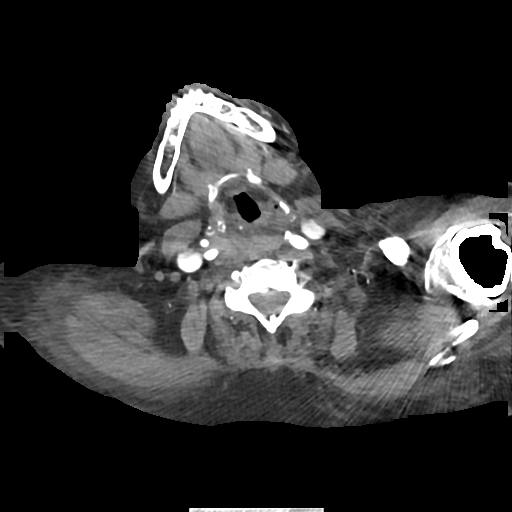
[im 109/163  soft-tissue]
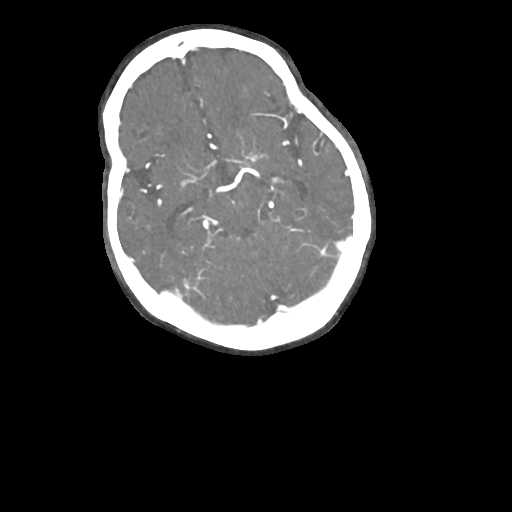

[Series 7: ax thins · axial · 0.39mm/px · z∈[+1083,+1313]mm · 6 of 322 slices shown]
[im 46/322  soft-tissue]
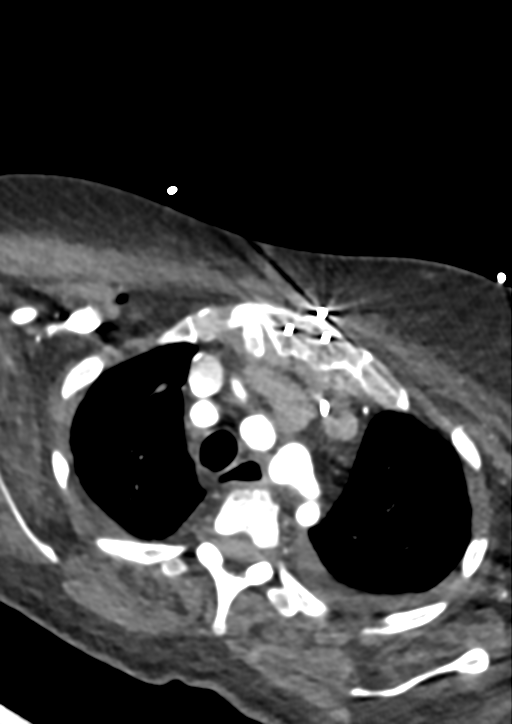
[im 92/322  bone]
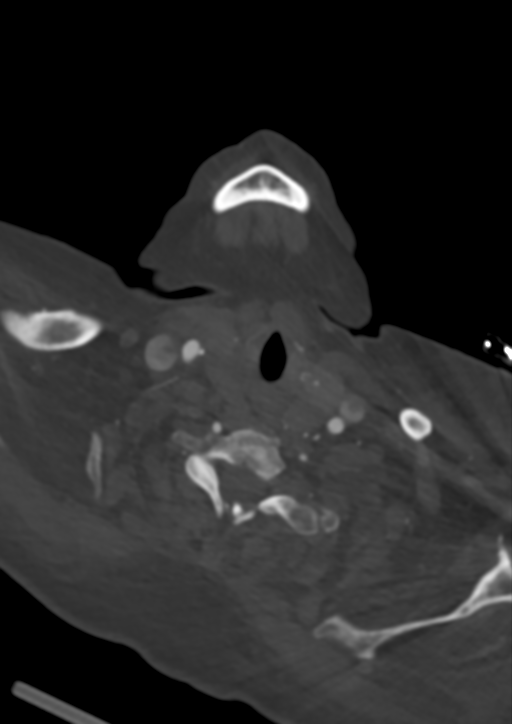
[im 138/322  soft-tissue]
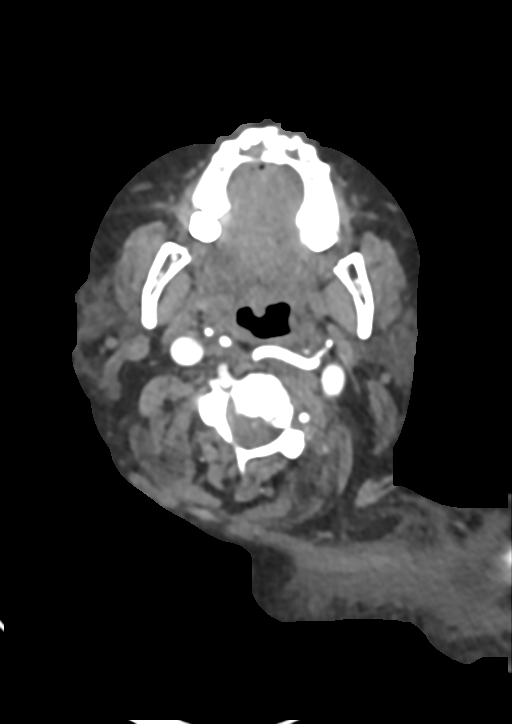
[im 184/322  bone]
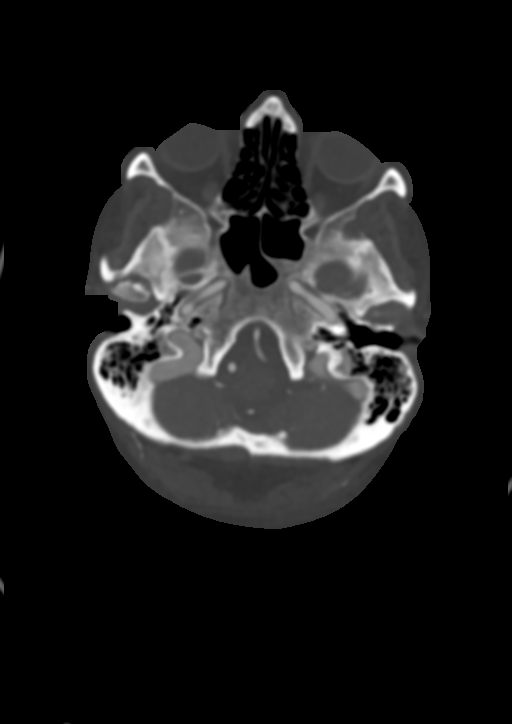
[im 230/322  soft-tissue]
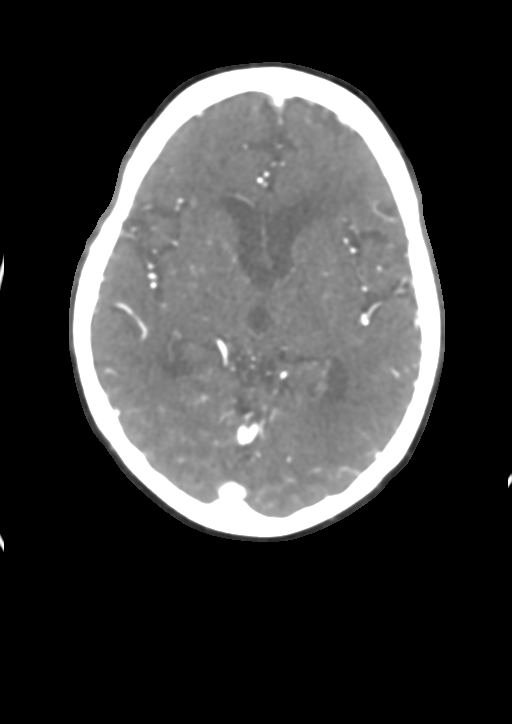
[im 276/322  bone]
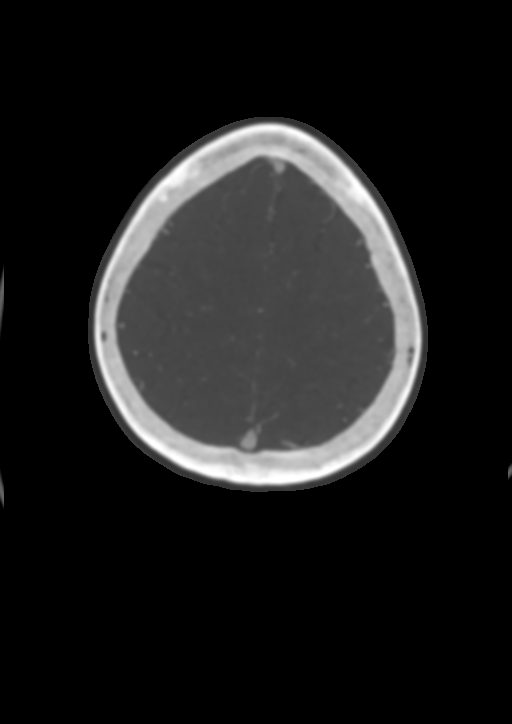

[8 of 33 positions shown; findings below may reference images not displayed]

FINDINGS: CTA NECK FINDINGS

SKELETON: There is no bony spinal canal stenosis. No lytic or
blastic lesion.

OTHER NECK: Normal pharynx, larynx and major salivary glands. No
cervical lymphadenopathy. Enlarged thyroid gland.

UPPER CHEST: Small left pleural effusion with fluid in the fissure.

AORTIC ARCH:

There is no calcific atherosclerosis of the aortic arch. There is no
aneurysm, dissection or hemodynamically significant stenosis of the
visualized portion of the aorta. Conventional 3 vessel aortic
branching pattern. The visualized proximal subclavian arteries are
widely patent.

RIGHT CAROTID SYSTEM: Normal without aneurysm, dissection or
stenosis.

LEFT CAROTID SYSTEM: No dissection, occlusion or aneurysm. Mild
atherosclerotic calcification at the carotid bifurcation without
hemodynamically significant stenosis.

VERTEBRAL ARTERIES: Codominant configuration. Both origins are
clearly patent. There is no dissection, occlusion or flow-limiting
stenosis to the skull base (V1-V3 segments).

CTA HEAD FINDINGS

POSTERIOR CIRCULATION:

--Vertebral arteries: Normal V4 segments.

--Inferior cerebellar arteries: Normal.

--Basilar artery: Normal.

--Superior cerebellar arteries: Normal.

--Posterior cerebral arteries (PCA): Severe stenosis of the right
PCA P3 segment over a length 7 mm ([DATE]). The distal right PCA is
normal. Mild stenosis of the left P3 segment in a similar location

ANTERIOR CIRCULATION:

--Intracranial internal carotid arteries: Normal.

--Anterior cerebral arteries (ACA): Normal. Hypoplastic right A1
segment, normal variant.

--Middle cerebral arteries (MCA): Normal.

VENOUS SINUSES: As permitted by contrast timing, patent.
Incidentally noted gas in the cavernous sinuses related to contrast
injection.

ANATOMIC VARIANTS: None

Review of the MIP images confirms the above findings.
IMPRESSION: 1. No emergent large vessel occlusion.
2. Severe stenosis of the right PCA P3 segment over a length of 7
mm.
3. Mild stenosis of the left PCA P3 segment.
4. Small left pleural effusion with fluid in the fissure.
5. Enlarged thyroid gland. This has been evaluated on previous
imaging. (ref: [HOSPITAL]. [DATE]): 143-50).

## 2020-04-22 MED ORDER — BUPROPION HCL ER (XL) 150 MG PO TB24
150.0000 mg | ORAL_TABLET | Freq: Every day | ORAL | Status: DC
  Administered 2020-04-22 – 2020-04-26 (×5): 150 mg via ORAL
  Filled 2020-04-22 (×5): qty 1

## 2020-04-22 MED ORDER — FOLIC ACID 1 MG PO TABS
2.0000 mg | ORAL_TABLET | Freq: Every day | ORAL | Status: DC
  Administered 2020-04-22 – 2020-04-26 (×5): 2 mg via ORAL
  Filled 2020-04-22 (×6): qty 2

## 2020-04-22 MED ORDER — ASPIRIN 81 MG PO CHEW
324.0000 mg | CHEWABLE_TABLET | Freq: Once | ORAL | Status: AC
  Administered 2020-04-22: 324 mg via ORAL
  Filled 2020-04-22: qty 4

## 2020-04-22 MED ORDER — ACETAMINOPHEN 650 MG RE SUPP: 650.0000 mg | RECTAL | Status: DC | PRN

## 2020-04-22 MED ORDER — ACETAMINOPHEN 160 MG/5ML PO SOLN: 650.0000 mg | ORAL | Status: DC | PRN

## 2020-04-22 MED ORDER — METOPROLOL TARTRATE 5 MG/5ML IV SOLN
5.0000 mg | Freq: Once | INTRAVENOUS | Status: AC
  Administered 2020-04-22: 5 mg via INTRAVENOUS
  Filled 2020-04-22: qty 5

## 2020-04-22 MED ORDER — INSULIN ASPART 100 UNIT/ML ~~LOC~~ SOLN
0.0000 [IU] | Freq: Three times a day (TID) | SUBCUTANEOUS | Status: DC
  Administered 2020-04-23: 2 [IU] via SUBCUTANEOUS
  Administered 2020-04-24 (×2): 1 [IU] via SUBCUTANEOUS
  Administered 2020-04-26: 2 [IU] via SUBCUTANEOUS

## 2020-04-22 MED ORDER — LORAZEPAM 2 MG/ML IJ SOLN
0.5000 mg | Freq: Four times a day (QID) | INTRAMUSCULAR | Status: DC | PRN
  Administered 2020-04-23: 0.5 mg via INTRAVENOUS
  Filled 2020-04-22: qty 1

## 2020-04-22 MED ORDER — CLOPIDOGREL BISULFATE 75 MG PO TABS
75.0000 mg | ORAL_TABLET | Freq: Every day | ORAL | Status: DC
  Administered 2020-04-23 – 2020-04-24 (×2): 75 mg via ORAL
  Filled 2020-04-22 (×3): qty 1

## 2020-04-22 MED ORDER — ROSUVASTATIN CALCIUM 5 MG PO TABS
20.0000 mg | ORAL_TABLET | Freq: Every day | ORAL | Status: DC
Start: 2020-04-22 — End: 2020-04-23
  Administered 2020-04-22: 20 mg via ORAL
  Filled 2020-04-22 (×2): qty 4

## 2020-04-22 MED ORDER — PREDNISONE 5 MG PO TABS
5.0000 mg | ORAL_TABLET | Freq: Every day | ORAL | Status: DC
  Administered 2020-04-23 – 2020-04-26 (×4): 5 mg via ORAL
  Filled 2020-04-22 (×4): qty 1

## 2020-04-22 MED ORDER — INSULIN ASPART 100 UNIT/ML ~~LOC~~ SOLN: 0.0000 [IU] | Freq: Every day | SUBCUTANEOUS | Status: DC

## 2020-04-22 MED ORDER — DULOXETINE HCL 60 MG PO CPEP
60.0000 mg | ORAL_CAPSULE | Freq: Every day | ORAL | Status: DC
  Administered 2020-04-22 – 2020-04-26 (×5): 60 mg via ORAL
  Filled 2020-04-22 (×6): qty 1

## 2020-04-22 MED ORDER — LEVOTHYROXINE SODIUM 75 MCG PO TABS
75.0000 ug | ORAL_TABLET | Freq: Every day | ORAL | Status: DC
  Administered 2020-04-23 – 2020-04-26 (×4): 75 ug via ORAL
  Filled 2020-04-22 (×3): qty 1

## 2020-04-22 MED ORDER — IRBESARTAN 150 MG PO TABS
75.0000 mg | ORAL_TABLET | Freq: Every day | ORAL | Status: DC
  Administered 2020-04-22 – 2020-04-24 (×3): 75 mg via ORAL
  Filled 2020-04-22 (×4): qty 1

## 2020-04-22 MED ORDER — HYDROCODONE-ACETAMINOPHEN 5-325 MG PO TABS
1.0000 | ORAL_TABLET | Freq: Four times a day (QID) | ORAL | Status: DC | PRN
  Administered 2020-04-25 – 2020-04-26 (×4): 1 via ORAL
  Filled 2020-04-22 (×4): qty 1

## 2020-04-22 MED ORDER — IOHEXOL 350 MG/ML SOLN
75.0000 mL | Freq: Once | INTRAVENOUS | Status: AC | PRN
  Administered 2020-04-22: 75 mL via INTRAVENOUS

## 2020-04-22 MED ORDER — METOPROLOL SUCCINATE ER 25 MG PO TB24
12.5000 mg | ORAL_TABLET | Freq: Every day | ORAL | Status: DC
Start: 2020-04-23 — End: 2020-04-25
  Administered 2020-04-23 – 2020-04-24 (×2): 12.5 mg via ORAL
  Filled 2020-04-22 (×2): qty 1

## 2020-04-22 MED ORDER — SODIUM CHLORIDE 0.9 % IV SOLN: Freq: Once | INTRAVENOUS | Status: AC

## 2020-04-22 MED ORDER — ACETAMINOPHEN 325 MG PO TABS
650.0000 mg | ORAL_TABLET | ORAL | Status: DC | PRN
  Administered 2020-04-24: 650 mg via ORAL
  Filled 2020-04-22: qty 2

## 2020-04-22 MED ORDER — ENOXAPARIN SODIUM 40 MG/0.4ML ~~LOC~~ SOLN
40.0000 mg | SUBCUTANEOUS | Status: DC
  Administered 2020-04-22 – 2020-04-23 (×2): 40 mg via SUBCUTANEOUS
  Filled 2020-04-22 (×3): qty 0.4

## 2020-04-22 MED ORDER — SODIUM CHLORIDE 0.9 % IV BOLUS
1000.0000 mL | Freq: Once | INTRAVENOUS | Status: AC
  Administered 2020-04-22: 1000 mL via INTRAVENOUS

## 2020-04-22 MED ORDER — ASPIRIN EC 81 MG PO TBEC
81.0000 mg | DELAYED_RELEASE_TABLET | Freq: Every day | ORAL | Status: DC
Start: 2020-04-23 — End: 2020-04-24
  Administered 2020-04-23 – 2020-04-24 (×2): 81 mg via ORAL
  Filled 2020-04-22 (×3): qty 1

## 2020-04-22 MED ORDER — STROKE: EARLY STAGES OF RECOVERY BOOK
Freq: Once | Status: AC
  Filled 2020-04-22: qty 1

## 2020-04-22 NOTE — ED Notes (Signed)
Placed pt on purewick  

## 2020-04-22 NOTE — ED Notes (Addendum)
Provider made are of patients abdnormal heart rhythm. Repeat EKG obtained.

## 2020-04-22 NOTE — H&P (Signed)
History and Physical    Madison Hurst ZOX:[id number] DOB: 1961-02-27 DOA: 04/22/2020  Referring MD/NP/PA: Carlisle Cater, PA-C PCP: Serita Grammes, MD  Patient coming from: home   Chief Complaint: Falls and weakness  I have personally briefly reviewed patient's old medical records in [place]   HPI: Madison Hurst is a 60 y.o. female with medical history significant of paroxysmal atrial tachycardia, CAD, hypertension, hyperlipidemia, diabetes mellitus type 2, rheumatoid arthritis, anxiety, and depression presents with complaints of worsening weakness on the left side most notably in the last week.  In the last 2 days patient reports that she has had 3 falls.  When she fell today she did hit her head, but not badly.  Patient reports that she has been feeling as though she could pass out, but had never lost consciousness.  She has felt weak all over, but the left side seems weaker than the right.  Notes that she has been having associated symptoms of headaches and these intermittent episodes of breaking out in sweats that she reports have actually been going on for a year now.    ED Course: Upon admission into the emergency department patient was seen to be afebrile, pulse elevated to 125, respirations 14-35, blood pressures 125/102-143/126, and O2 saturation maintained on room air.  CT scan of the brain showed no acute abnormalities.  Patient was out of the window for TPA labs significant for WBC 13.2, MCV 100.8, glucose 173, albumin 2.7, alkaline phosphatase 162, AST 65, ALT 62, TSH 0.74, CK 87, and INR 1.3.  COVID-19 screening was negative.  MRI was positive for acute infarction consistent with posterior circulation embolic disease without signs of hemorrhage.  Patient had been given 1 L normal saline IV fluids, 5 mg of metoprolol IV, and 324 mg of aspirin.  Review of Systems  Constitutional: Positive for diaphoresis. Negative for fever.  HENT: Negative  for congestion and nosebleeds.   Eyes: Negative for photophobia and pain.  Respiratory: Negative for cough and hemoptysis.   Cardiovascular: Negative for chest pain and leg swelling.  Gastrointestinal: Negative for abdominal pain, diarrhea, nausea and vomiting.  Genitourinary: Negative for dysuria and hematuria.  Musculoskeletal: Positive for falls and joint pain.       Positive for swelling of hands  Neurological: Positive for headaches. Negative for loss of consciousness.  Psychiatric/Behavioral: Negative for memory loss and substance abuse.    Past Medical History:  Diagnosis Date  . Anemia 02/09/2020  . Anxiety 10/28/2019  . Depressed left ventricular ejection fraction 03/13/2020  . Depressive disorder 10/28/2019  . DOE (dyspnea on exertion) 03/13/2020  . Hypothyroidism 02/09/2020  . Iron deficiency anemia 01/24/2020  . Iron deficiency anemia, unspecified   . Metabolic syndrome 81/19/1478  . Multinodular goiter 03/07/2015  . PAC (premature atrial contraction) 03/13/2020  . PAT (paroxysmal atrial tachycardia) ([place]) 03/13/2020  . Pericardial effusion 02/09/2020  . 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 Seropositive rheumatoid arthritis ([place]) 02/09/2020  . 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 Type 2 diabetes mellitus ([place]) 05/08/2019  . Vitamin D deficiency 02/09/2020    Past Surgical History:  Procedure Laterality Date  . CESAREAN SECTION    . CHOLECYSTECTOMY    . other     Growth removal on chest  . TOTAL SHOULDER ARTHROPLASTY Left      reports that she quit smoking about 26 years ago. She has never used smokeless tobacco. No history on file for alcohol use and drug use.  Allergies  Allergen Reactions  . Sulfa Antibiotics   . Sulfamethoxazole-Trimethoprim Rash    Family History  Problem Relation Age of Onset  . Muscular dystrophy Mother   . Diabetes Father   . Diabetes Brother   . Heart disease Brother     Prior to Admission medications   Medication Sig  Start Date End Date Taking? Authorizing Provider  Abatacept (ORENCIA [place]) Inject into the skin once a week.    [provider]  ARIPiprazole (ABILIFY) 10 MG tablet Take 10 mg by mouth daily. 12/23/19   [provider]  aspirin EC 81 MG tablet Take 1 tablet (81 mg total) by mouth daily. Swallow whole. 04/12/20   Tobb, Kardie, DO  buPROPion (WELLBUTRIN XL) 150 MG 24 hr tablet Take 150 mg by mouth daily. 02/09/20   [provider]  DULoxetine (CYMBALTA) 60 MG capsule 60 mg. 02/14/20   [provider]  ergocalciferol (VITAMIN D2) 1.25 MG (50000 UT) capsule Take 50,000 Units by mouth once a week.    [provider]  flurbiprofen (ANSAID) 100 MG tablet Take 100 mg by mouth 2 (two) times daily. 01/09/20   [provider]  folic acid (FOLVITE) 1 MG tablet Take 2 mg by mouth daily.    [provider]  HYDROcodone-acetaminophen (NORCO/VICODIN) 5-325 MG tablet Take 1 tablet by mouth as needed. 02/09/20   [provider]  levothyroxine (SYNTHROID) 75 MCG tablet Take 75 mcg by mouth daily before breakfast.    [provider]  metFORMIN (GLUCOPHAGE-XR) 500 MG 24 hr tablet Take 500 mg by mouth daily. 03/12/20   [provider]  metFORMIN (GLUMETZA) 500 MG (MOD) 24 hr tablet Take 500 mg by mouth daily with breakfast.    [provider]  metoprolol succinate (TOPROL XL) 25 MG 24 hr tablet Take 0.5 tablets (12.5 mg total) by mouth daily. 03/13/20   Tobb, Godfrey Pick, DO  Multiple Vitamin (MULTIVITAMIN) capsule Take 1 capsule by mouth daily.    [provider]  [facility] CLICKJECT 381 MG/ML SOAJ  01/24/20   [provider]  predniSONE (DELTASONE) 5 MG tablet Take 5 mg by mouth daily. 03/20/20   [provider]  rosuvastatin (CRESTOR) 20 MG tablet Take 1 tablet (20 mg total) by mouth daily. 04/12/20 07/11/20  Tobb, Kardie, DO  telmisartan (MICARDIS) 20 MG tablet Take 20 mg by mouth daily. 01/30/20    [provider]    Physical Exam:  Constitutional: Middle-aged female who appears to be in some distress Vitals:   04/22/20 1530 04/22/20 1545 04/22/20 1600 04/22/20 1715  BP:  (!) 126/108 (!) 139/119 (!) 126/92  Pulse: (!) 111 (!) 113  98  Resp: 14 20 20 16   Temp:      TempSrc:      SpO2: 92% 99%  97%  Weight:       Eyes: PERRL, lids and conjunctivae normal ENMT: Mucous membranes are dry. Posterior pharynx clear of any exudate or lesions.   Neck: normal, supple, no masses, no thyromegaly Respiratory: clear to auscultation bilaterally, no wheezing, no crackles. Normal respiratory effort. No accessory muscle use.  Cardiovascular: Regular rate and rhythm, no murmurs / rubs / gallops.  Swelling present on the bilateral pain 2+ pedal pulses. No carotid bruits.   Abdomen: no tenderness, no masses palpated. No hepatosplenomegaly. Bowel sounds positive.  Musculoskeletal: Nodules appreciated to be consistent with rheumatoid arthritis of the hands Skin: no rashes, lesions, ulcers. No induration Neurologic: CN 2-12 grossly intact. Sensation intact, DTR  normal.  Strength 3-4/5 on the LUE, 4/5 on the LLE, 4/5 on the RUE, 5/5 on the RLE. Psychiatric: Normal judgment and insight. Alert and oriented x 3. Normal mood.     Labs on Admission: I have personally reviewed following labs and imaging studies  CBC: Recent Labs  Lab 04/22/20 1342  WBC 13.2*  NEUTROABS 10.6*  HGB 14.9  HCT 49.0*  MCV 100.8*  PLT 505   Basic Metabolic Panel: Recent Labs  Lab 04/22/20 1342  NA 137  K 3.7  CL 98  CO2 28  GLUCOSE 173*  BUN 15  CREATININE 0.59  CALCIUM 8.6*   GFR: Estimated Creatinine Clearance: 60.8 mL/min (by C-G formula based on SCr of 0.59 mg/dL). Liver Function Tests: Recent Labs  Lab 04/22/20 1342  AST 65*  ALT 62*  ALKPHOS 162*  BILITOT 0.9  PROT 5.3*  ALBUMIN 2.7*   No results for input(s): LIPASE, AMYLASE in the last 168 hours. No results for input(s): AMMONIA  in the last 168 hours. Coagulation Profile: Recent Labs  Lab 04/22/20 1342  INR 1.3*   Cardiac Enzymes: Recent Labs  Lab 04/22/20 1342  CKTOTAL 87   BNP (last 3 results) No results for input(s): PROBNP in the last 8760 hours. HbA1C: No results for input(s): HGBA1C in the last 72 hours. CBG: No results for input(s): GLUCAP in the last 168 hours. Lipid Profile: No results for input(s): CHOL, HDL, LDLCALC, TRIG, CHOLHDL, LDLDIRECT in the last 72 hours. Thyroid Function Tests: Recent Labs    04/22/20 1453  TSH 0.740   Anemia Panel: No results for input(s): VITAMINB12, FOLATE, FERRITIN, TIBC, IRON, RETICCTPCT in the last 72 hours. Urine analysis: No results found for: COLORURINE, APPEARANCEUR, LABSPEC, PHURINE, GLUCOSEU, HGBUR, BILIRUBINUR, KETONESUR, PROTEINUR, UROBILINOGEN, NITRITE, LEUKOCYTESUR Sepsis Labs: Recent Results (from the past 240 hour(s))  SARS Coronavirus 2 by RT PCR (hospital order, performed in [facility] hospital lab) Nasopharyngeal Nasopharyngeal Swab     Status: None   Collection Time: 04/22/20  1:42 PM   Specimen: Nasopharyngeal Swab  Result Value Ref Range Status   SARS Coronavirus 2 NEGATIVE NEGATIVE Final    Comment: (NOTE) SARS-CoV-2 target nucleic acids are NOT DETECTED.  The SARS-CoV-2 RNA is generally detectable in upper and lower respiratory specimens during the acute phase of infection. The lowest concentration of SARS-CoV-2 viral copies this assay can detect is 250 copies / mL. A negative result does not preclude SARS-CoV-2 infection and should not be used as the sole basis for treatment or other patient management decisions.  A negative result may occur with improper specimen collection / handling, submission of specimen other than nasopharyngeal swab, presence of viral mutation(s) within the areas targeted by this assay, and inadequate number of viral copies (<250 copies / mL). A negative result must be combined with  clinical observations, patient history, and epidemiological information.  Fact Sheet for Patients:   StrictlyIdeas.no  Fact Sheet for Healthcare Providers: BankingDealers.co.za  This test is not yet approved or  cleared by the [place] FDA and has been authorized for detection and/or diagnosis of SARS-CoV-2 by FDA under an Emergency Use Authorization (EUA).  This EUA will remain in effect (meaning this test can be used) for the duration of the COVID-19 declaration under Section 564(b)(1) of the Act, 21 U.S.C. section 360bbb-3(b)(1), unless the authorization is terminated or revoked sooner.  Performed at [facility], [place] [address], [place], [place] [address]      Radiological Exams on Admission:  CT Head Wo Contrast  Result Date: 04/22/2020 CLINICAL DATA:  Tube deficit. Progressive weakness, greater on the left. Fell today. EXAM: CT HEAD WITHOUT CONTRAST TECHNIQUE: Contiguous axial images were obtained from the base of the skull through the vertex without intravenous contrast. COMPARISON:  02/16/2020 FINDINGS: Brain: No evidence of acute infarction, hemorrhage, hydrocephalus, extra-axial collection or mass lesion/mass effect. Subcortical and periventricular white matter hypodensity most likely result of chronic ischemic small vessel disease. Similar findings can be seen with demyelinating disease of the white matter. Diffuse cortical atrophy again seen. Vascular: No hyperdense vessel or unexpected calcification. Skull: Normal. Negative for fracture or focal lesion. Sinuses/Orbits: No acute finding. Other: None. IMPRESSION: No acute intracranial abnormality. Electronically Signed   By: Miachel Roux M.D.   On: 04/22/2020 14:29   MR BRAIN WO CONTRAST  Result Date: 04/22/2020 CLINICAL DATA:  Acute neurological deficit. Progressive weakness, worse on the left. EXAM: MRI HEAD WITHOUT CONTRAST TECHNIQUE: Multiplanar, multiecho pulse  sequences of the brain and surrounding structures were obtained without intravenous contrast. COMPARISON:  Head CT same day.  MRI 02/28/2020. FINDINGS: Brain: Diffusion imaging shows acute infarction affecting the right mid-posterior caudate an the superior thalamus. Single separate punctate acute infarction in the cortex of the medial right parietooccipital junction. Few small punctate acute infarctions in the right occipital cortex. 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 Elsewhere, there is chronic small vessel ischemic change affecting the hemispheric deep and subcortical white matter, right more than left. No focal insult affects the brainstem or cerebellum. No evidence of mass, hemorrhage, hydrocephalus or extra-axial collection Vascular: Major vessels at the base of the brain show flow. Skull and upper cervical spine: Negative Sinuses/Orbits: Clear/normal Other: None IMPRESSION: 1. Acute infarction affecting the right mid-posterior caudate and right superior thalamus. Single separate punctate acute infarction in the cortex of the medial right parietooccipital junction. Few small punctate acute infarctions in the right occipital cortex. 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 No evidence of hemorrhage or mass effect. 2. Chronic small-vessel ischemic changes elsewhere throughout the brain as outlined above. Electronically Signed   By: Nelson Chimes M.D.   On: 04/22/2020 17:12   DG Chest Port 1 View  Result Date: 04/22/2020 CLINICAL DATA:  Weakness EXAM: PORTABLE CHEST 1 VIEW COMPARISON:  Portable exam 1345 hours compared to 12/21/2012 FINDINGS: Upper normal heart size post median sternotomy. Mediastinal contours and pulmonary vascularity normal. Subsegmental atelectasis versus scarring at LEFT base. Remaining lungs clear. No acute infiltrate, pleural effusion, or pneumothorax. Bones demineralized with note of a LEFT shoulder prosthesis and advanced RIGHT  glenohumeral degenerative changes. IMPRESSION: Atelectasis versus scarring at LEFT base. Electronically Signed   By: Lavonia Dana M.D.   On: 04/22/2020 14:01    EKG: Independently reviewed.  Atrial fibrillation/tachycardia 125 bpm  Assessment/Plan CVA: Acute.  Patient presents after having several falls at home with worsening weakness most notably on the left side.  Noted to have acute infarcts consistent with a posterior circulation embolic disease.   -Admit to telemetry bed -Stroke order set initiated -Neuro checks -Check ESR, RPR, Hemoglobin A1c, and lipid panel  -Follow-up CTA of the head and neck and MRI of the cervical -Check echocardiogram -PT/OT/Speech to eval and treat -Continue ASA 81 mg daily -Appreciate neurology consultative services, will follow-up for any further recommendation  Leukocytosis SIRS: Acute.  On admission WBC 13.2.  -Follow-up urinalysis -Recheck CBC in a.m.  Paroxysmal atrial tachycardia abnormal coronary CT: On admission patient was seen in the have heart rates  elevated in the 120s.  Patient reports that she also had an abnormal coronary CT for which she was opposed to follow-up with Dr. Harriet Masson of cardiology. -Message sent for cardiology to evaluate in a.m. -Continue beta blocker for rate control  Essential hypertension: On admission blood pressures were maintained.  Home blood pressure medications include metoprolol 12.5 mg daily and telmisartan 20 mg daily. -Continue home regimen with pharmacy substitution  Positive rheumatoid arthritis: Patient with swelling of the bilateral hands that she states is chronic due to rheumatoid arthritis.  Home medication regimen includes prednisone 5 mg daily, Orencia, flurbiprofen 100 mg twice daily. -Continue prednisone  -Held medications of Flurbiprofen due to risk of thrombotic event  Diabetes mellitus type 2: On admission glucose elevated up to 173.  Home medications include Metformin 500 mg daily. -Hypoglycemic  protocol -Hold Metformin -CBGs before every meal and nightly with sensitive SSI  Anxiety and depression -Continue home medications include Wellbutrin 150 mg daily and Cymbalta 60 mg daily  Hyperlipidemia:  -Continue Home medications Crestor 20 mg daily -Monitor LFTs  Hypothyroidism: TSH on admission was 0.74.   -Continue home medication regimen includes levothyroxine 75 mcg daily.  Hypoalbuminemia: Acute.  Albumin 2.7. -Check prealbumin in a.m.  Transaminitis: Acute.  Alkaline phosphatase 162, AST 65, ALT 62. -Continue to monitor daily  DVT prophylaxis: Lovenox Code Status: Full Family Communication: Husband updated at bedside Disposition Plan: Likely need of rehab Consults called: Neurology Admission status: Inpatient status due to acute stroke likely to require at least 2 midnight stay  Norval Morton MD [facility]   If 7PM-7AM, please contact night-coverage   04/22/2020, 6:05 PM

## 2020-04-22 NOTE — ED Provider Notes (Signed)
[facility] EMERGENCY DEPARTMENT Provider Note   CSN: [id number] Arrival date & time: 04/22/20  1251     History Chief Complaint  Patient presents with  . Weakness    Generalized weakness and "not herself" x 2 weeks.  Fall today    Madison Hurst is a 60 y.o. female.  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  Patient states that her symptoms have been ongoing for about 2 weeks.  Of note, patient has had PCP and cardiology visits for shortness of breath and generalized weakness.  Patient had a recent coronary CT with markedly elevated calcium score.  She has not yet followed up regarding this.  Patient has noted slurred speech.  She has had multiple falls due to her weakness.  She complains of a mild headache, mild pain in her neck and slightly worse pain in her lower back.  She denies numbness or tingling in her extremities.          Past Medical History:  Diagnosis Date  . Anemia 02/09/2020  . Anxiety 10/28/2019  . Depressed left ventricular ejection fraction 03/13/2020  . Depressive disorder 10/28/2019  . DOE (dyspnea on exertion) 03/13/2020  . Hypothyroidism 02/09/2020  . Iron deficiency anemia 01/24/2020  . Iron deficiency anemia, unspecified   . Metabolic syndrome 03/13/2020  . Multinodular goiter 03/07/2015  . PAC (premature atrial contraction) 03/13/2020  . PAT (paroxysmal atrial tachycardia) (HCC) 03/13/2020  . Pericardial effusion 02/09/2020  . 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 Type 2 diabetes mellitus (HCC) 05/08/2019  . Vitamin D deficiency  02/09/2020    Patient Active Problem List   Diagnosis Date Noted  . DOE (dyspnea on exertion) 03/13/2020  . Metabolic syndrome 03/13/2020  . Depressed left ventricular ejection fraction 03/13/2020  . PAC (premature atrial contraction) 03/13/2020  . PAT (paroxysmal atrial tachycardia) (HCC) 03/13/2020  . Anemia 02/09/2020  . Hypothyroidism 02/09/2020  . Pericardial effusion 02/09/2020  . 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 Vitamin D deficiency 02/09/2020  . Iron deficiency anemia, unspecified   . Iron deficiency anemia 01/24/2020  . Anxiety 10/28/2019  . Depressive disorder 10/28/2019  . Type 2 diabetes mellitus (HCC) 05/08/2019  . 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 Multinodular goiter 03/07/2015    Past Surgical History:  Procedure Laterality Date  . CESAREAN SECTION    . CHOLECYSTECTOMY    . other     Growth removal on chest  . TOTAL SHOULDER ARTHROPLASTY Left      OB History   No obstetric history on file.     Family History  Problem Relation Age of Onset  . Muscular dystrophy Mother   . Diabetes Father   . Diabetes Brother   . Heart disease Brother     Social History   Tobacco Use  . Smoking status: Former Smoker    Quit date: 02/11/1994    Years since quitting: 26.2  . Smokeless tobacco: Never Used  Vaping Use  . Vaping Use: Never used    Home Medications Prior to Admission medications   Medication Sig Start Date End Date Taking? Authorizing Provider  Abatacept [facility])  Inject into the skin once a week.    [provider]  ARIPiprazole (ABILIFY) 10 MG tablet Take 10 mg by mouth daily. 12/23/19   [provider]  aspirin EC 81 MG tablet Take 1 tablet (81 mg total) by mouth daily. Swallow whole. 04/12/20   Tobb, Kardie, DO  buPROPion (WELLBUTRIN XL) 150 MG 24 hr tablet Take 150 mg by mouth daily. 02/09/20   [provider]  DULoxetine (CYMBALTA) 60 MG capsule  60 mg. 02/14/20   [provider]  ergocalciferol (VITAMIN D2) 1.25 MG (50000 UT) capsule Take 50,000 Units by mouth once a week.    [provider]  flurbiprofen (ANSAID) 100 MG tablet Take 100 mg by mouth 2 (two) times daily. 01/09/20   [provider]  folic acid (FOLVITE) 1 MG tablet Take 2 mg by mouth daily.    [provider]  HYDROcodone-acetaminophen (NORCO/VICODIN) 5-325 MG tablet Take 1 tablet by mouth as needed. 02/09/20   [provider]  levothyroxine (SYNTHROID) 75 MCG tablet Take 75 mcg by mouth daily before breakfast.    [provider]  metFORMIN (GLUCOPHAGE-XR) 500 MG 24 hr tablet Take 500 mg by mouth daily. 03/12/20   [provider]  metFORMIN (GLUMETZA) 500 MG (MOD) 24 hr tablet Take 500 mg by mouth daily with breakfast.    [provider]  metoprolol succinate (TOPROL XL) 25 MG 24 hr tablet Take 0.5 tablets (12.5 mg total) by mouth daily. 03/13/20   Tobb, Lavona [place], DO  Multiple Vitamin (MULTIVITAMIN) capsule Take 1 capsule by mouth daily.    [provider]  [employer] CLICKJECT 125 MG/ML SOAJ  01/24/20   [provider]  predniSONE (DELTASONE) 5 MG tablet Take 5 mg by mouth daily. 03/20/20   [provider]  rosuvastatin (CRESTOR) 20 MG tablet Take 1 tablet (20 mg total) by mouth daily. 04/12/20 07/11/20  Tobb, Kardie, DO  telmisartan (MICARDIS) 20 MG tablet Take 20 mg by mouth daily. 01/30/20   [provider]    Allergies    Sulfa antibiotics and Sulfamethoxazole-trimethoprim  Review of Systems   Review of Systems  Constitutional: Positive for fatigue. Negative for fever.  HENT: Negative for rhinorrhea and sore throat.   Eyes: Negative for redness.  Respiratory: Negative for cough and shortness of breath.   Cardiovascular: Negative for chest pain.  Gastrointestinal: Negative for abdominal pain, diarrhea, nausea and vomiting.  Genitourinary: Negative for dysuria,  frequency, hematuria and urgency.  Musculoskeletal: Positive for back pain and neck pain. Negative for myalgias.  Skin: Negative for rash.  Neurological: Positive for facial asymmetry, weakness, light-headedness and headaches.    Physical Exam Updated Vital Signs BP (!) 131/113 (BP Location: Left Arm)   Pulse (!) 115   Temp 99 F (37.2 C) (Oral)   Resp 20   Wt 55.8 kg   SpO2 99%   BMI 24.02 kg/m   Physical Exam Vitals and nursing note reviewed.  Constitutional:      General: She is not in acute distress.    Appearance: She is well-developed.  HENT:     Head: Normocephalic and atraumatic.     Right Ear: External ear normal.     Left Ear: External ear normal.     Nose: Nose normal. No congestion or rhinorrhea.     Mouth/Throat:     Mouth: Mucous membranes are dry.  Eyes:     Conjunctiva/sclera: Conjunctivae normal.  Cardiovascular:     Rate and Rhythm: Normal  rate and regular rhythm.     Heart sounds: No murmur heard.   Pulmonary:     Effort: No respiratory distress.     Breath sounds: No wheezing, rhonchi or rales.  Abdominal:     Palpations: Abdomen is soft.     Tenderness: There is no abdominal tenderness. There is no guarding or rebound.  Musculoskeletal:     Cervical back: Normal range of motion and neck supple.     Right lower leg: No edema.     Left lower leg: No edema.  Skin:    General: Skin is warm and dry.     Findings: No rash.  Neurological:     General: No focal deficit present.     Mental Status: She is alert.     GCS: GCS eye subscore is 4. GCS verbal subscore is 5. GCS motor subscore is 6.     Cranial Nerves: Cranial nerve deficit, dysarthria and facial asymmetry present.     Sensory: Sensation is intact.     Motor: Weakness and pronator drift present.     Comments: Slight left sided facial weakness.  Patient with generalized weakness of her upper and lower extremities, but more so on the left.  Decreased grip on the left with pronator drift.   Patient is able to hold her right arm and leg up against gravity, but unable to do so for more than a few seconds on the left side. Gait testing deferred.   Psychiatric:        Mood and Affect: Mood normal.     ED Results / Procedures / Treatments   Labs (all labs ordered are listed, but only abnormal results are displayed) Labs Reviewed  PROTIME-INR - Abnormal; Notable for the following components:      Result Value   Prothrombin Time 15.9 (*)    INR 1.3 (*)    All other components within normal limits  APTT - Abnormal; Notable for the following components:   aPTT 21 (*)    All other components within normal limits  CBC - Abnormal; Notable for the following components:   WBC 13.2 (*)    HCT 49.0 (*)    MCV 100.8 (*)    RDW 16.9 (*)    All other components within normal limits  DIFFERENTIAL - Abnormal; Notable for the following components:   Neutro Abs 10.6 (*)    Monocytes Absolute 1.1 (*)    Abs Immature Granulocytes 0.10 (*)    All other components within normal limits  COMPREHENSIVE METABOLIC PANEL - Abnormal; Notable for the following components:   Glucose, Bld 173 (*)    Calcium 8.6 (*)    Total Protein 5.3 (*)    Albumin 2.7 (*)    AST 65 (*)    ALT 62 (*)    Alkaline Phosphatase 162 (*)    All other components within normal limits  SARS CORONAVIRUS 2 BY RT PCR (HOSPITAL ORDER, PERFORMED IN [facility])  CK  ETHANOL  TSH  URINALYSIS, ROUTINE W REFLEX MICROSCOPIC  RAPID URINE DRUG SCREEN, HOSP PERFORMED    EKG EKG Interpretation  Date/Time:  Monday April 22 2020 13:03:50 EST Ventricular Rate:  116 PR Interval:    QRS Duration: 121 QT Interval:  351 QTC Calculation: 488 R Axis:   16 Text Interpretation: Sinus tachycardia with irregular rate Left bundle branch block agree. no old comparison Confirmed by Arby Barrette [id number]) on 04/22/2020 2:55:22 PM   Radiology DG Chest  Port 1 View  Result Date: 04/22/2020 CLINICAL DATA:  Weakness  EXAM: PORTABLE CHEST 1 VIEW COMPARISON:  Portable exam 1345 hours compared to 12/21/2012 FINDINGS: Upper normal heart size post median sternotomy. Mediastinal contours and pulmonary vascularity normal. Subsegmental atelectasis versus scarring at LEFT base. Remaining lungs clear. No acute infiltrate, pleural effusion, or pneumothorax. Bones demineralized with note of a LEFT shoulder prosthesis and advanced RIGHT glenohumeral degenerative changes. IMPRESSION: Atelectasis versus scarring at LEFT base. Electronically Signed   By: Ulyses Southward M.D.   On: 04/22/2020 14:01    Procedures Procedures   Medications Ordered in ED Medications  metoprolol tartrate (LOPRESSOR) injection 5 mg (has no administration in time range)  sodium chloride 0.9 % bolus 1,000 mL (0 mLs Intravenous Stopped 04/22/20 1720)  aspirin chewable tablet 324 mg (324 mg Oral Given 04/22/20 1739)    ED Course  I have reviewed the triage vital signs and the nursing notes.  Pertinent labs & imaging results that were available during my care of the patient were reviewed by me and considered in my medical decision making (see chart for details).  Patient seen and examined. Work-up initiated. Fluids ordered.   Vital signs reviewed and are as follows: BP (!) 125/100   Pulse 98   Temp 99 F (37.2 C) (Oral)   Resp 19   Wt 55.8 kg   SpO2 96%   BMI 24.02 kg/m   Stroke work-up and other labs reviewed.  CT negative.  Patient discussed with Dr. Donnald Garre who has seen patient.  MRI brain with and without ordered to evaluate for potential infarct.   5:59 PM MRI resulted -- shows posterior distribution embolic infarcts likely.  Aspirin ordered.  Heart rate 110's.  Will give IV metoprolol.  Patient is on this chronically.  Discussed case with Dr. Iver Nestle of neurology.  They will see.  Agree with medical admission.  6:10 PM Discussed with Dr. Katrinka Blazing, [facility], who will see.     MDM Rules/Calculators/A&P                          Admit.     Final Clinical Impression(s) / ED Diagnoses Final diagnoses:  Acute CVA (cerebrovascular accident) [facility])  Atrial tachycardia [facility])    Rx / DC Orders ED Discharge Orders    None       Renne Crigler, Cordelia Poche 04/22/20 1849    Arby Barrette, MD 04/24/20 [phone]

## 2020-04-22 NOTE — ED Notes (Signed)
Patient transported to CT 

## 2020-04-22 NOTE — ED Provider Notes (Incomplete)
I provided a substantive portion of the care of this patient.  I personally performed the entirety of the {Shared Choices:25152} for this encounter. {Remember to document shared critical care using "edcritical" dot phrase:1} EKG Interpretation  Date/Time:  Monday April 22 2020 13:03:50 EST Ventricular Rate:  116 PR Interval:    QRS Duration: 121 QT Interval:  351 QTC Calculation: 488 R Axis:   16 Text Interpretation: Sinus tachycardia with irregular rate Left bundle branch block agree. no old comparison Confirmed by Arby Barrette [phone]) on 04/22/2020 2:55:22 PM

## 2020-04-22 NOTE — ED Notes (Signed)
Patient transported to MRI 

## 2020-04-22 NOTE — Consult Note (Addendum)
Neurology Consultation Reason for Consult: Left sided weakness  Requesting Physician: March Pfeiffer  CC: Generalized weakness, worse on the left  History is obtained from: Patient and chart review  HPI: Madison Hurst is a 60 y.o. female with a past medical history significant for paroxysmal atrial tachycardia, coronary artery disease planned for catheterization, hypothyroidism, hyperlipidemia, hypertension, diabetes, seropositive rheumatoid arthritis, anxiety, depression, iron deficiency anemia  She reports that about 2 weeks ago she had worsening left-sided weakness, this has increased in a stepwise progression over the past 2 weeks.  Due to falling today at home she decided to present to the ED for evaluation.  This fall was in the setting of feeling dizzy beforehand and her husband noted her color was gray afterwards.  On review of systems she does report some mild headaches for the past 2 weeks as well, no double vision, blurry vision, trouble with her speech or swallow, other focal weakness or numbness, urinary incontinence, blood in her urine or stool.  She has had a chronic decline over at least a year of increasing fatigue and dizzy spells, which she has been undergoing outpatient cardiology evaluation for. She had a recent ZIO monitor for 8 days starting February 12, 2020, which showed a maximum heart rate of 272 bpm with 90 runs of supraventricular tachycardia (felt to be atrial tachycardia with variable block) as well as frequent premature complexes.  No AV nodal block or atrial fibrillation were present, nor were there pauses.  She was started on metoprolol 12.5 mg for this abnormal rhythm, and started on Crestor as well. Additionally she had an echocardiogram on 03/11/2020 which demonstrated reduced ejection fraction (40 to 45%) with mild concentric LVH and grade 1 diastolic dysfunction as well as mildly dilated left atrium   Regarding her rheumatoid arthritis, she is on  Orencia and prednisone 5 mg daily, stable regimen for the past 3 years at least.  LKW: Mid January tPA given?: No, due to out of the window Premorbid modified rankin scale:     2 - Slight disability. Able to look after own affairs without assistance, but unable to carry out all previous activities mostly secondary to her arthritis 3 ROS: A 14 point ROS was performed and is negative except as noted in the HPI.   Past Medical History:  Diagnosis Date  . Anemia 02/09/2020  . Anxiety 10/28/2019  . Depressed left ventricular ejection fraction 03/13/2020  . Depressive disorder 10/28/2019  . DOE (dyspnea on exertion) 03/13/2020  . Hypothyroidism 02/09/2020  . Iron deficiency anemia 01/24/2020  . Iron deficiency anemia, unspecified   . Metabolic syndrome 94/76/5465  . Multinodular goiter 03/07/2015  . PAC (premature atrial contraction) 03/13/2020  . PAT (paroxysmal atrial tachycardia) ([place]) 03/13/2020  . Pericardial effusion 02/09/2020  . 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 Seropositive rheumatoid arthritis ([place]) 02/09/2020  . 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 Type 2 diabetes mellitus ([place]) 05/08/2019  . Vitamin D deficiency 02/09/2020   Past Surgical History:  Procedure Laterality Date  . CESAREAN SECTION    . CHOLECYSTECTOMY    . other     Growth removal on chest  . TOTAL SHOULDER ARTHROPLASTY Left      Family History  Problem Relation Age of Onset  . Muscular dystrophy Mother   . Diabetes Father   . Diabetes Brother   . Heart disease Brother    Social History:  reports that she quit smoking about 26 years ago. She has never used smokeless tobacco. No  history on file for alcohol use and drug use.  Exam: Current vital signs: BP (!) 126/92   Pulse 98   Temp 98.6 F (37 C) (Oral)   Resp 16   Wt 55.8 kg   SpO2 97%   BMI 24.02 kg/m  Vital signs in last 24 hours: Temp:  [98.6 F (37 C)-99 F (37.2 C)] 98.6 F (37 C) (01/24 1500) Pulse Rate:  [98-115] 98 (01/24  1715) Resp:  [14-20] 16 (01/24 1715) BP: (125-139)/(92-119) 126/92 (01/24 1715) SpO2:  [92 %-99 %] 97 % (01/24 1715) Weight:  [55.8 kg] 55.8 kg (01/24 1258)   Physical Exam  Constitutional: Appears well-developed and well-nourished.  Psych: Affect appropriate to situation Eyes: No scleral injection HENT: No OP obstruction MSK: Arthritic changes of bilateral hands and toes Cardiovascular: Irregularly irregular rhythm Respiratory: Effort normal, non-labored breathing GI: Soft.  No distension. There is no tenderness.  Skin: Purplish toes which patient reports is baseline secondary to her arthritis  Neuro: Mental Status: Patient is awake, alert, oriented to person, place, month, year, and situation. Patient is able to give a clear and coherent history. No signs of aphasia or neglect Cranial Nerves: II: Visual Fields are full. Pupils are equal, round, and reactive to light.   III,IV, VI: EOMI without ptosis or diploplia.  V: Facial sensation is symmetric to temperature VII: Facial movement is symmetric.  VIII: hearing is intact to voice X: Uvula elevates symmetrically XI: Shoulder shrug is symmetric. XII: tongue is midline without atrophy or fasciculations.  Motor: Tone is normal. Bulk is normal.  Right upper extremity 4/5 throughout, left upper extremity 4-/5 throughout, right lower extremity 5/5 throughout, left lower extremity 4/5 hip flexion, otherwise 5/5 throughout Sensory: Sensation is symmetric to light touch and temperature in the arms and legs. Deep Tendon Reflexes: Right biceps 2+, left biceps 3+, bilateral patellar 3+ Plantars: Toes are downgoing bilaterally.  Cerebellar: FNF and HKS are intact bilaterally  NIHSS total 2 Score breakdown: 1 point for left arm weakness and 1 point for left leg weakness  I have reviewed labs in epic and the results pertinent to this consultation are: Creatinine 0.59 Mild LFT elevations AST 65, ALT 62 CK 87  No results found for:  VITAMINB12   I have reviewed the images obtained: Head CT with chronic microvascular disease and no clear acute intracranial process MRI brain with acute infarct of the right thalamic capsular area, with a punctate lesion in the right occipital pole as well  Impression: This is a 60 year old woman with past medical history significant for vascular risk factors as above, presenting with a subacute stroke.  Despite her recent cardiac work-up, given progression of her symptoms, favor repeating echocardiogram.  Given that she is already likely 1 to 2 weeks out from stroke onset, I will not rush to add Plavix right now but will await vessel imaging.  If she has indication for anticoagulation, this would be preferred over dual antiplatelet therapy.  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) - Stroke labs TSH, ESR, RPR, HgbA1c, fasting lipid panel - MRI brain completed as above - CTA  - Frequent neuro checks - Echocardiogram - Prophylactic therapy-continue home aspirin 81 mg daily - Trend LFTs, if they continue to rise consider PSK 9 inhibitor instead of statin - Consider Plavix 300 mg load with 75 mg daily for 21 - 90 day course if no indication for anticoagulation, pending vessel imaging - Risk factor modification - Telemetry monitoring; consider repeat  zio patch if ECHO shows worsening cardiac function   -Telemetry demonstrated irregularly irregular heart rhythm at bedside, suggest review with cardiology to confirm no atrial fibrillation - Blood pressure goal   - Normotension - PT consult, OT consult, Speech consult - Stroke team to follow  # Generalized weakness with hyperreflexia - MRI C-spine to rule out compressive process given history of rheumatoid arthritis - B12  Lesleigh Noe MD-PhD [facility] [phone]

## 2020-04-22 NOTE — ED Triage Notes (Signed)
Pt lives independently at home,  TIA a month ago.  Pt has been having increasing generalized weakness for 2 weeks.  Pt has more weakness on left side than right with both sides being weak.  Pt fell today and son had to get her up, no injuries.  Pt has also had slurred speech.

## 2020-04-23 ENCOUNTER — Inpatient Hospital Stay (HOSPITAL_COMMUNITY): Payer: Medicare PPO

## 2020-04-23 DIAGNOSIS — I6389 Other cerebral infarction: Secondary | ICD-10-CM | POA: Diagnosis not present

## 2020-04-23 DIAGNOSIS — I639 Cerebral infarction, unspecified: Secondary | ICD-10-CM

## 2020-04-23 DIAGNOSIS — M059 Rheumatoid arthritis with rheumatoid factor, unspecified: Secondary | ICD-10-CM

## 2020-04-23 DIAGNOSIS — I2510 Atherosclerotic heart disease of native coronary artery without angina pectoris: Secondary | ICD-10-CM

## 2020-04-23 DIAGNOSIS — E1169 Type 2 diabetes mellitus with other specified complication: Secondary | ICD-10-CM

## 2020-04-23 DIAGNOSIS — R0602 Shortness of breath: Secondary | ICD-10-CM | POA: Diagnosis not present

## 2020-04-23 DIAGNOSIS — I471 Supraventricular tachycardia: Secondary | ICD-10-CM

## 2020-04-23 DIAGNOSIS — I10 Essential (primary) hypertension: Secondary | ICD-10-CM | POA: Diagnosis not present

## 2020-04-23 DIAGNOSIS — E7800 Pure hypercholesterolemia, unspecified: Secondary | ICD-10-CM

## 2020-04-23 DIAGNOSIS — I63439 Cerebral infarction due to embolism of unspecified posterior cerebral artery: Secondary | ICD-10-CM

## 2020-04-23 DIAGNOSIS — I2583 Coronary atherosclerosis due to lipid rich plaque: Secondary | ICD-10-CM

## 2020-04-23 LAB — URINALYSIS, ROUTINE W REFLEX MICROSCOPIC
Bilirubin Urine: NEGATIVE
Glucose, UA: NEGATIVE mg/dL
Ketones, ur: NEGATIVE mg/dL
Nitrite: NEGATIVE
Protein, ur: 100 mg/dL — AB
RBC / HPF: >50 RBC/hpf — ABNORMAL HIGH (ref 0–5)
Specific Gravity, Urine: 1.03 (ref 1.005–1.030)
pH: 6 (ref 5.0–8.0)

## 2020-04-23 LAB — CBC
HCT: 47.7 % — ABNORMAL HIGH (ref 36.0–46.0)
Hemoglobin: 14.5 g/dL (ref 12.0–15.0)
MCH: 31.1 pg (ref 26.0–34.0)
MCHC: 30.4 g/dL (ref 30.0–36.0)
MCV: 102.4 fL — ABNORMAL HIGH (ref 80.0–100.0)
Platelets: 319 10*3/uL (ref 150–400)
RBC: 4.66 MIL/uL (ref 3.87–5.11)
RDW: 17.2 % — ABNORMAL HIGH (ref 11.5–15.5)
WBC: 13 10*3/uL — ABNORMAL HIGH (ref 4.0–10.5)
nRBC: 0.2 % (ref 0.0–0.2)

## 2020-04-23 LAB — GLUCOSE, CAPILLARY
Glucose-Capillary: 151 mg/dL — ABNORMAL HIGH (ref 70–99)
Glucose-Capillary: 128 mg/dL — ABNORMAL HIGH (ref 70–99)
Glucose-Capillary: 120 mg/dL — ABNORMAL HIGH (ref 70–99)

## 2020-04-23 LAB — COMPREHENSIVE METABOLIC PANEL
ALT: 121 U/L — ABNORMAL HIGH (ref 0–44)
AST: 203 U/L — ABNORMAL HIGH (ref 15–41)
Albumin: 2.7 g/dL — ABNORMAL LOW (ref 3.5–5.0)
Alkaline Phosphatase: 218 U/L — ABNORMAL HIGH (ref 38–126)
Anion gap: 13 (ref 5–15)
BUN: 13 mg/dL (ref 6–20)
CO2: 24 mmol/L (ref 22–32)
Calcium: 8 mg/dL — ABNORMAL LOW (ref 8.9–10.3)
Chloride: 99 mmol/L (ref 98–111)
Creatinine, Ser: 0.64 mg/dL (ref 0.44–1.00)
GFR, Estimated: >60 mL/min (ref >60)
Glucose, Bld: 127 mg/dL — ABNORMAL HIGH (ref 70–99)
Potassium: 3.5 mmol/L (ref 3.5–5.1)
Sodium: 136 mmol/L (ref 135–145)
Total Bilirubin: 1.1 mg/dL (ref 0.3–1.2)
Total Protein: 5.3 g/dL — ABNORMAL LOW (ref 6.5–8.1)

## 2020-04-23 LAB — CBG MONITORING, ED
Glucose-Capillary: 134 mg/dL — ABNORMAL HIGH (ref 70–99)
Glucose-Capillary: 216 mg/dL — ABNORMAL HIGH (ref 70–99)

## 2020-04-23 LAB — RAPID URINE DRUG SCREEN, HOSP PERFORMED
Amphetamines: NOT DETECTED
Barbiturates: NOT DETECTED
Benzodiazepines: NOT DETECTED
Cocaine: NOT DETECTED
Opiates: POSITIVE — AB
Tetrahydrocannabinol: NOT DETECTED

## 2020-04-23 LAB — ECHOCARDIOGRAM COMPLETE
Area-P 1/2: 5.13 cm2
S' Lateral: 4 cm
Weight: 1968 oz

## 2020-04-23 LAB — RPR: RPR Ser Ql: NONREACTIVE

## 2020-04-23 LAB — PREALBUMIN: Prealbumin: 17.5 mg/dL — ABNORMAL LOW (ref 18–38)

## 2020-04-23 LAB — HEPATITIS PANEL, ACUTE
HCV Ab: NONREACTIVE
Hep A IgM: NONREACTIVE
Hep B C IgM: NONREACTIVE
Hepatitis B Surface Ag: NONREACTIVE

## 2020-04-23 LAB — VITAMIN B12: Vitamin B-12: 659 pg/mL (ref 180–914)

## 2020-04-23 LAB — FOLATE: Folate: 50.7 ng/mL (ref >5.9)

## 2020-04-23 IMAGING — MR MR CERVICAL SPINE W/O CM
3 series · 17 of 48 positions shown · non-contrast
Comparison: CT head/neck [DATE].  CT cervical spine [DATE].

CLINICAL DATA: Myelopathy, acute or progressive. Additional history
provided: Patient reports worsening weakness on the left side, most
notably within the last week, 3 falls in the last 2 days weakness
all over, but the left-sided seems weaker than the right.

EXAM:
MRI CERVICAL SPINE WITHOUT CONTRAST
TECHNIQUE: Multiplanar, multisequence MR imaging of the cervical spine was
performed. No intravenous contrast was administered.

[Series 3: T2 · sagittal · 3.0mm · 0.39mm/px · 9 of 13 slices shown (1 of 2)]
[im 1/13]
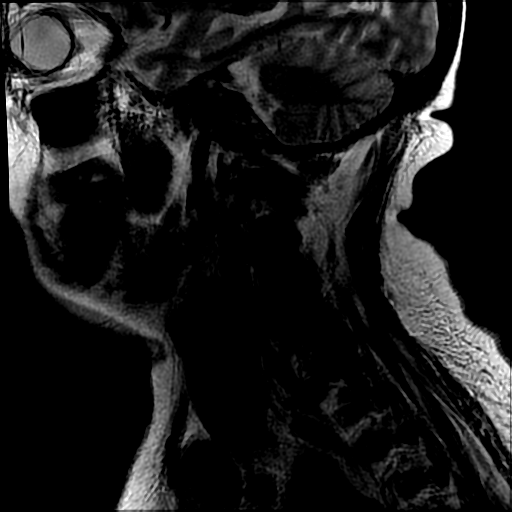
[im 3/13]
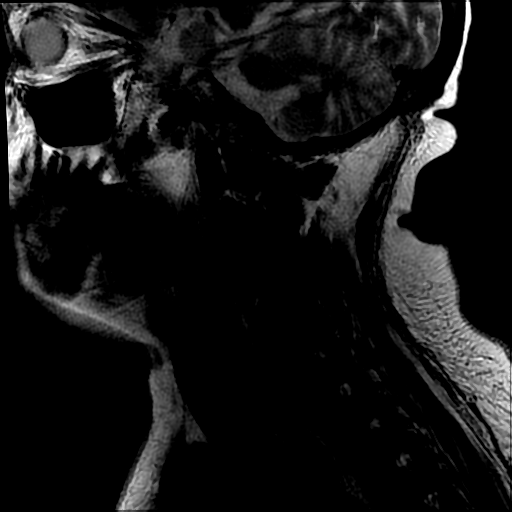
[im 4/13]
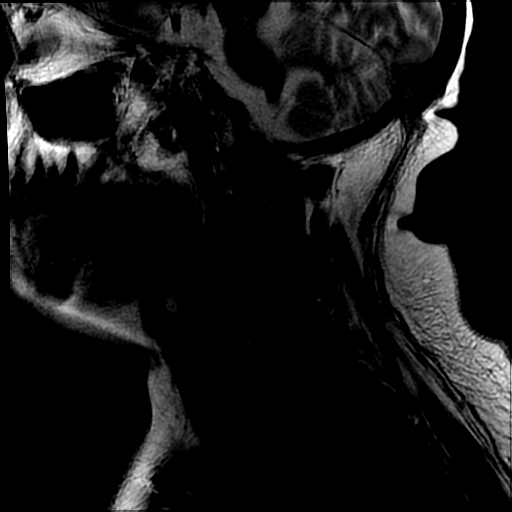
[im 6/13]
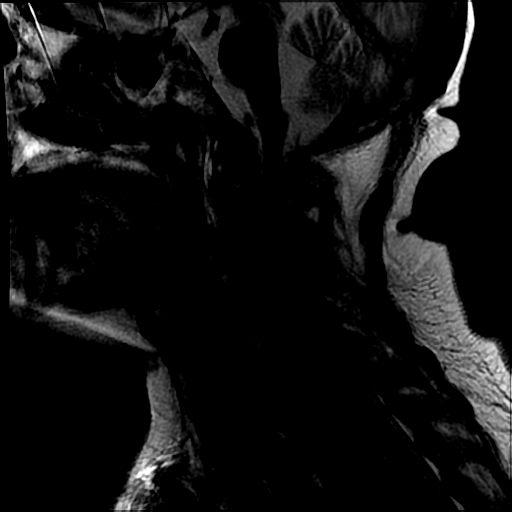
[im 7/13]
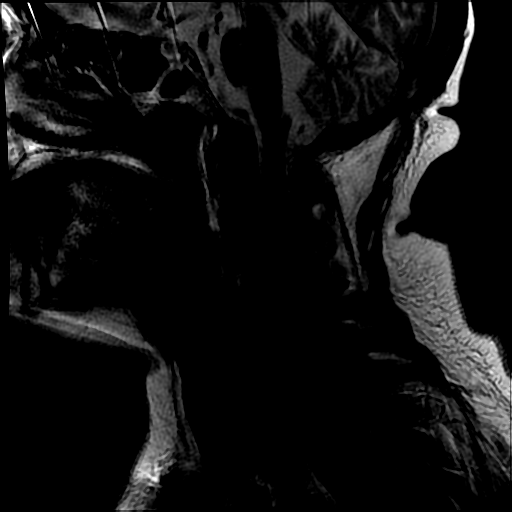
[im 9/13]
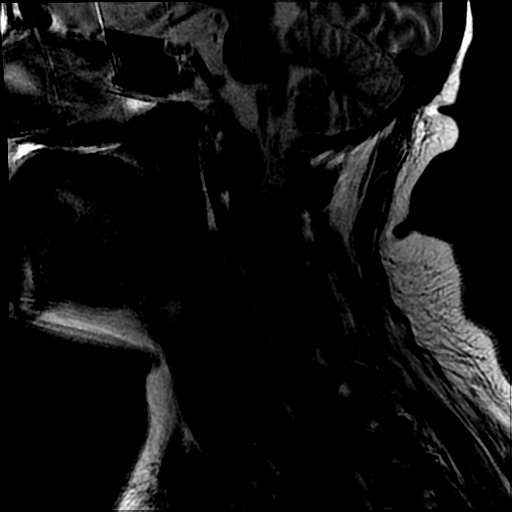
[im 10/13]
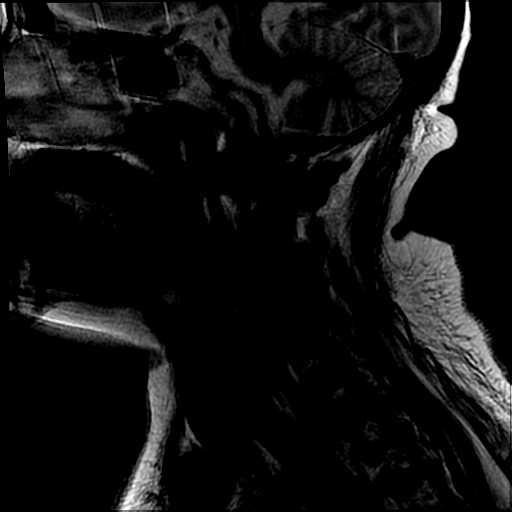
[im 11/13]
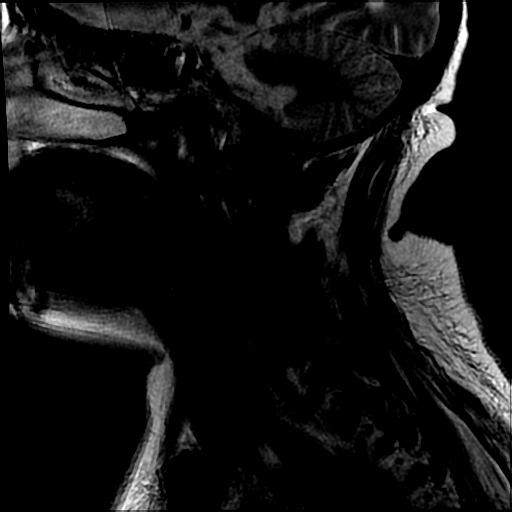
[im 13/13]
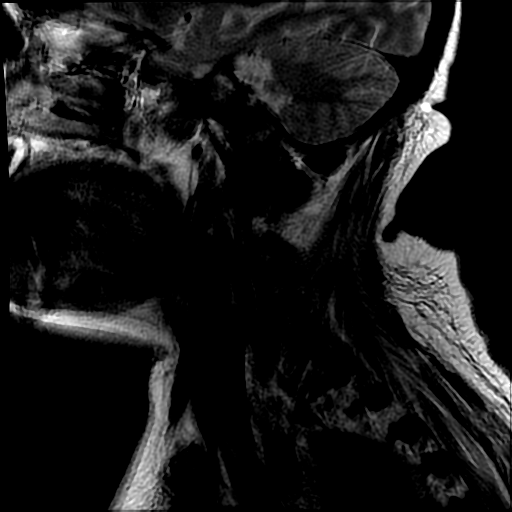

[Series 4: FLAIR · sagittal · 3.0mm · 0.39mm/px · 3 of 13 slices shown]
[im 3/13]
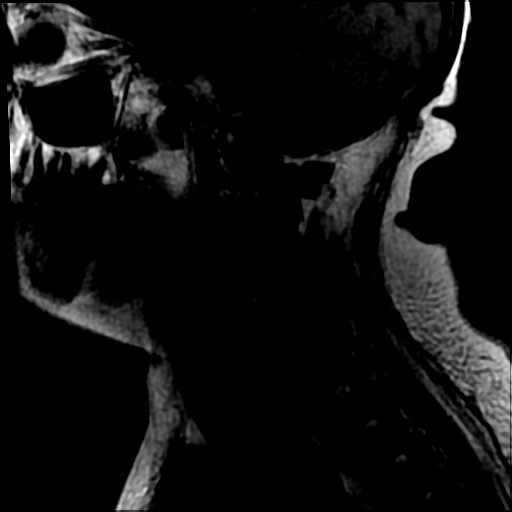
[im 7/13]
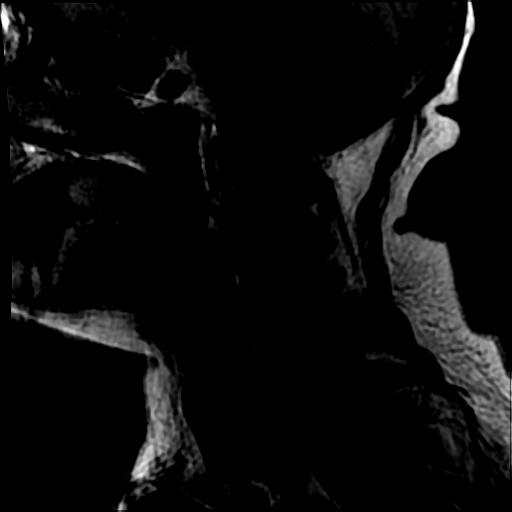
[im 11/13]
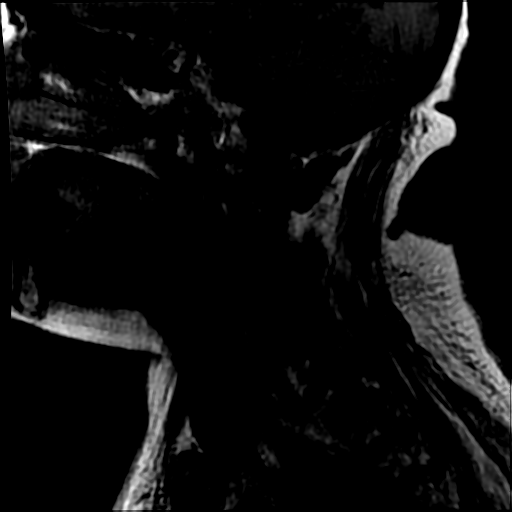

[Series 6: T2 · axial · 3.0mm · 0.43mm/px · z∈[-35,+27]mm · 5 of 25 slices shown (2 of 2)]
[im 2/25]
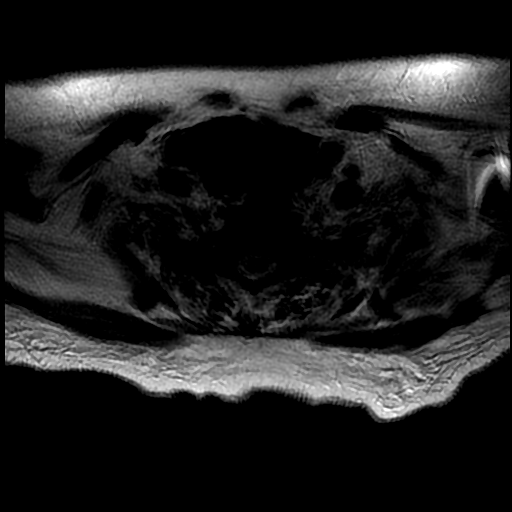
[im 5/25]
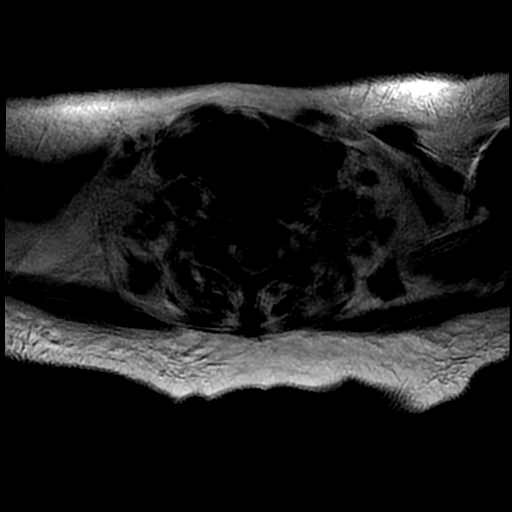
[im 8/25]
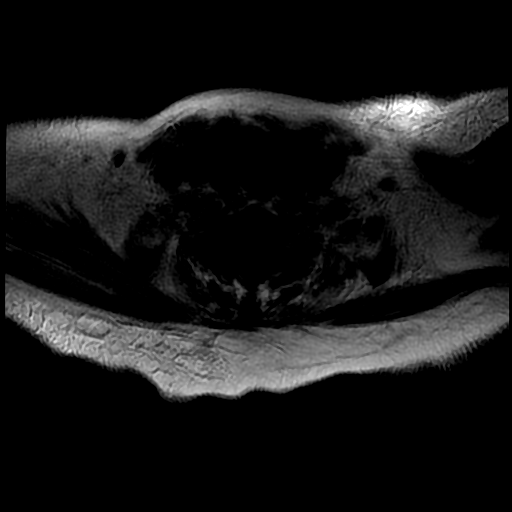
[im 13/25]
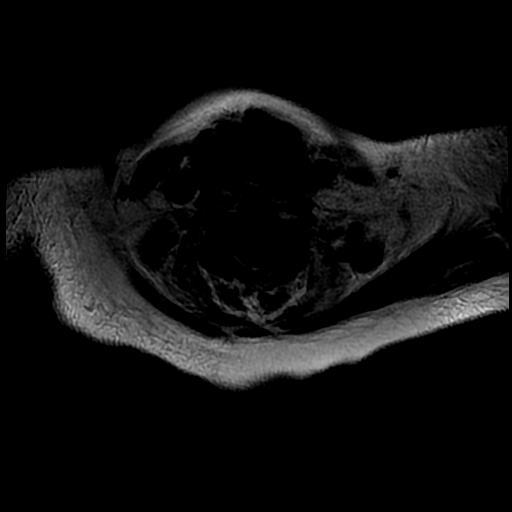
[im 21/25]
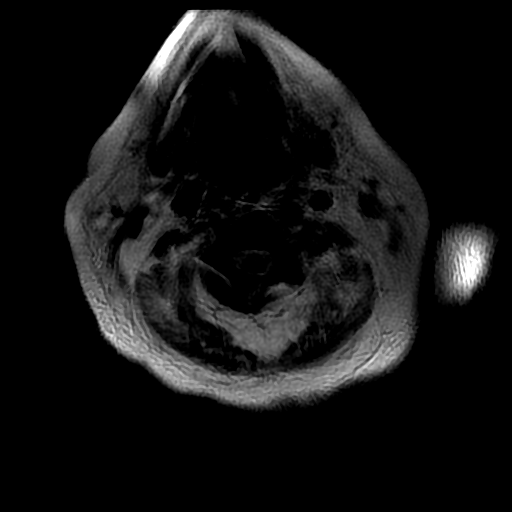

[17 of 48 positions shown; findings below may reference images not displayed]

FINDINGS: All sequences are moderate to severely motion degraded,
significantly limiting evaluation.

Alignment: Straightening of the expected cervical lordosis. No
significant spondylolisthesis.

Vertebrae: No significant marrow edema or focal suspicious osseous
CHIHK is identified.

Cord: Motion degradation precludes adequate evaluation for spinal
cord signal abnormality.

Posterior Fossa, vertebral arteries, paraspinal tissues: Cerebellar
atrophy. No acute abnormality identified within included portions of
the posterior fossa. Cervical vertebral artery flow voids poorly
assessed due to motion degradation. No paraspinal soft tissue
abnormality is identified.

Disc levels:

Multilevel disc degeneration. Most notably, there is moderate disc
degeneration at the C4-C5, C5-C6 and C6-C7 levels.

C2-C3: Disc bulge. Facet hypertrophy. No significant spinal canal
stenosis. Mild bilateral neural foraminal narrowing.

C3-C4: Disc bulge. Bilateral disc osteophyte ridge/uncinate
hypertrophy (greater on the right). Facet and ligamentum flavum
hypertrophy. Apparent moderate spinal canal stenosis. Bilateral
neural foraminal narrowing (severe right, mild left).

C4-C5: Posterior disc osteophyte complex. Uncovertebral, facet and
ligamentum flavum hypertrophy. Apparent moderate/severe spinal canal
stenosis. Bilateral neural foraminal narrowing (moderate/severe
right, moderate left).

C5-C6: Posterior disc osteophyte complex. Uncovertebral, facet and
ligamentum flavum hypertrophy. Apparent moderate/severe spinal canal
stenosis. Bilateral neural foraminal narrowing (moderate right,
moderate/severe left).

C6-C7: Posterior disc osteophyte complex. Uncovertebral, facet and
ligamentum flavum hypertrophy. Apparent moderate spinal canal
stenosis. Bilateral neural foraminal narrowing (moderate/severe
right, moderate left).

C7-T1: Mild endplate spurring on the left. Mild facet hypertrophy
(greater on the left). No appreciable disc herniation or spinal
canal stenosis. Mild left neural foraminal narrowing.
IMPRESSION: All acquired sequences are moderate to severely motion degraded,
significantly limiting evaluation. A repeat examination should be
considered when the patient is better able to tolerate the study.

Cervical spondylosis as outlined. Most notably, there is apparent
moderate/severe spinal canal stenosis at C4-C5 and C5-C6, and
apparent moderate spinal canal stenosis at C3-C4 and C6-C7. Motion
degradation limits evaluation for subtle spinal cord mass effect and
precludes adequate evaluation for spinal cord signal abnormality.

Multilevel neural foraminal narrowing as detailed and greatest on
the right at C3-C4 (severe), on the right at C4-C5
(moderate/severe), on the left at C5-C6 (moderate/severe) and on the
right at C6-C7 (moderate/severe).

## 2020-04-23 MED ORDER — LORAZEPAM 0.5 MG PO TABS
0.5000 mg | ORAL_TABLET | Freq: Three times a day (TID) | ORAL | Status: DC | PRN
  Administered 2020-04-23: 0.5 mg via ORAL
  Filled 2020-04-23: qty 1

## 2020-04-23 MED ORDER — LORAZEPAM 2 MG/ML IJ SOLN
1.0000 mg | Freq: Four times a day (QID) | INTRAMUSCULAR | Status: AC | PRN
  Administered 2020-04-23: 1 mg via INTRAVENOUS
  Filled 2020-04-23: qty 1

## 2020-04-23 NOTE — Progress Notes (Addendum)
STROKE TEAM PROGRESS NOTE   INTERVAL HISTORY No acute events since arrival.  Per ED team cervical spine MRI added to work up to rule out myelopathy due to generalized weakness.   Patient is drowsy and requires stimulation for ROS. Currently denies pain and specifically neck pain or radiating arm pain, back pain or leg pain as well as chest pain. She also denies numbness or tingling in face arms or legs.  She states she understands she has a had stroke. She agrees to TEE to continue stroke work up and to explore possibility of needing LOOP recorder placement. Our conversation was limited by her drowsiness and one word responses.   Vitals:   04/23/20 0200 04/23/20 0309 04/23/20 0532 04/23/20 0730  BP: 119/77 (!) 138/125 (!) 127/108 111/87  Pulse: 93 (!) 59 69 (!) 103  Resp: 20 (!) 21 (!) 23 20  Temp:    97.9 F (36.6 C)  TempSrc:    Oral  SpO2: 95% 99% 100% 97%  Weight:       CBC:  Recent Labs  Lab 04/22/20 1342 04/23/20 0337  WBC 13.2* 13.0*  NEUTROABS 10.6*  --   HGB 14.9 14.5  HCT 49.0* 47.7*  MCV 100.8* 102.4*  PLT 342 319   Basic Metabolic Panel:  Recent Labs  Lab 04/22/20 1342 04/23/20 0337  NA 137 136  K 3.7 3.5  CL 98 99  CO2 28 24  GLUCOSE 173* 127*  BUN 15 13  CREATININE 0.59 0.64  CALCIUM 8.6* 8.0*   Lipid Panel:  Recent Labs  Lab 04/22/20 1838  CHOL 159  TRIG 111  HDL 49  CHOLHDL 3.2  VLDL 22  LDLCALC 88   HgbA1c:  Recent Labs  Lab 04/22/20 1838  HGBA1C 6.1*   Urine Drug Screen:  Recent Labs  Lab 04/23/20 0139  LABOPIA POSITIVE*  COCAINSCRNUR NONE DETECTED  LABBENZ NONE DETECTED  AMPHETMU NONE DETECTED  THCU NONE DETECTED  LABBARB NONE DETECTED    Alcohol Level  Recent Labs  Lab 04/22/20 1342  ETH <10    IMAGING past 24 hours CT Code Stroke CTA Head W/WO contrast  Result Date: 04/22/2020 CLINICAL DATA:  Worsening left-sided weakness for 2 weeks. Recent TIA. Fall today. Slurred speech. EXAM: CT ANGIOGRAPHY HEAD AND NECK  TECHNIQUE: Multidetector CT imaging of the head and neck was performed using the standard protocol during bolus administration of intravenous contrast. Multiplanar CT image reconstructions and MIPs were obtained to evaluate the vascular anatomy. Carotid stenosis measurements (when applicable) are obtained utilizing NASCET criteria, using the distal internal carotid diameter as the denominator. CONTRAST:  75mL OMNIPAQUE IOHEXOL 350 MG/ML SOLN COMPARISON:  Brain MRI 04/22/2020 Head CT 04/22/2020 FINDINGS: CTA NECK FINDINGS SKELETON: There is no bony spinal canal stenosis. No lytic or blastic lesion. OTHER NECK: Normal pharynx, larynx and major salivary glands. No cervical lymphadenopathy. Enlarged thyroid gland. UPPER CHEST: Small left pleural effusion with fluid in the fissure. AORTIC ARCH: There is no calcific atherosclerosis of the aortic arch. There is no aneurysm, dissection or hemodynamically significant stenosis of the visualized portion of the aorta. Conventional 3 vessel aortic branching pattern. The visualized proximal subclavian arteries are widely patent. RIGHT CAROTID SYSTEM: Normal without aneurysm, dissection or stenosis. LEFT CAROTID SYSTEM: No dissection, occlusion or aneurysm. Mild atherosclerotic calcification at the carotid bifurcation without hemodynamically significant stenosis. VERTEBRAL ARTERIES: Codominant configuration. Both origins are clearly patent. There is no dissection, occlusion or flow-limiting stenosis to the skull base (V1-V3 segments). CTA HEAD FINDINGS   POSTERIOR CIRCULATION: --Vertebral arteries: Normal V4 segments. --Inferior cerebellar arteries: Normal. --Basilar artery: Normal. --Superior cerebellar arteries: Normal. --Posterior cerebral arteries (PCA): Severe stenosis of the right PCA P3 segment over a length 7 mm (12:17). The distal right PCA is normal. Mild stenosis of the left P3 segment in a similar location (12:24). ANTERIOR CIRCULATION: --Intracranial internal carotid  arteries: Normal. --Anterior cerebral arteries (ACA): Normal. Hypoplastic right A1 segment, normal variant. --Middle cerebral arteries (MCA): Normal. VENOUS SINUSES: As permitted by contrast timing, patent. Incidentally noted gas in the cavernous sinuses related to contrast injection. ANATOMIC VARIANTS: None Review of the MIP images confirms the above findings. IMPRESSION: 1. No emergent large vessel occlusion. 2. 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 3. Mild stenosis of the left PCA P3 segment. 4. Small left pleural effusion with fluid in the fissure. 5. Enlarged thyroid gland. This has been evaluated on previous imaging. (ref: J Am Coll Radiol. 2015 Feb;12(2): 143-50). Electronically Signed   By: Kevin  Herman M.D.   On: 04/22/2020 20:14   CT Head Wo Contrast  Result Date: 04/22/2020 CLINICAL DATA:  Tube deficit. Progressive weakness, greater on the left. Fell today. EXAM: CT HEAD WITHOUT CONTRAST TECHNIQUE: Contiguous axial images were obtained from the base of the skull through the vertex without intravenous contrast. COMPARISON:  02/16/2020 FINDINGS: Brain: No evidence of acute infarction, hemorrhage, hydrocephalus, extra-axial collection or mass lesion/mass effect. Subcortical and periventricular white matter hypodensity most likely result of chronic ischemic small vessel disease. Similar findings can be seen with demyelinating disease of the white matter. Diffuse cortical atrophy again seen. Vascular: No hyperdense vessel or unexpected calcification. Skull: Normal. Negative for fracture or focal lesion. Sinuses/Orbits: No acute finding. Other: None. IMPRESSION: No acute intracranial abnormality. Electronically Signed   By: Farhaan  Mir M.D.   On: 04/22/2020 14:29   CT Code Stroke CTA Neck W/WO contrast  Result Date: 04/22/2020 CLINICAL DATA:  Worsening left-sided weakness for 2 weeks. Recent TIA. Fall today. Slurred speech. EXAM: CT ANGIOGRAPHY HEAD AND NECK TECHNIQUE:  Multidetector CT imaging of the head and neck was performed using the standard protocol during bolus administration of intravenous contrast. Multiplanar CT image reconstructions and MIPs were obtained to evaluate the vascular anatomy. Carotid stenosis measurements (when applicable) are obtained utilizing NASCET criteria, using the distal internal carotid diameter as the denominator. CONTRAST:  75mL OMNIPAQUE IOHEXOL 350 MG/ML SOLN COMPARISON:  Brain MRI 04/22/2020 Head CT 04/22/2020 FINDINGS: CTA NECK FINDINGS SKELETON: There is no bony spinal canal stenosis. No lytic or blastic lesion. OTHER NECK: Normal pharynx, larynx and major salivary glands. No cervical lymphadenopathy. Enlarged thyroid gland. UPPER CHEST: Small left pleural effusion with fluid in the fissure. AORTIC ARCH: There is no calcific atherosclerosis of the aortic arch. There is no aneurysm, dissection or hemodynamically significant stenosis of the visualized portion of the aorta. Conventional 3 vessel aortic branching pattern. The visualized proximal subclavian arteries are widely patent. RIGHT CAROTID SYSTEM: Normal without aneurysm, dissection or stenosis. LEFT CAROTID SYSTEM: No dissection, occlusion or aneurysm. Mild atherosclerotic calcification at the carotid bifurcation without hemodynamically significant stenosis. VERTEBRAL ARTERIES: Codominant configuration. Both origins are clearly patent. There is no dissection, occlusion or flow-limiting stenosis to the skull base (V1-V3 segments). CTA HEAD FINDINGS POSTERIOR CIRCULATION: --Vertebral arteries: Normal V4 segments. --Inferior cerebellar arteries: Normal. --Basilar artery: Normal. --Superior cerebellar arteries: Normal. --Posterior cerebral arteries (PCA): Severe stenosis of the right PCA P3 segment over a length 7 mm (12:17). The distal right   PCA is normal. Mild stenosis of the left P3 segment in a similar location (12:24). ANTERIOR CIRCULATION: --Intracranial internal carotid arteries:  Normal. --Anterior cerebral arteries (ACA): Normal. Hypoplastic right A1 segment, normal variant. --Middle cerebral arteries (MCA): Normal. VENOUS SINUSES: As permitted by contrast timing, patent. Incidentally noted gas in the cavernous sinuses related to contrast injection. ANATOMIC VARIANTS: None Review of the MIP images confirms the above findings. IMPRESSION: 1. No emergent large vessel occlusion. 2. 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 3. Mild stenosis of the left PCA P3 segment. 4. Small left pleural effusion with fluid in the fissure. 5. Enlarged thyroid gland. This has been evaluated on previous imaging. (ref: J Am Coll Radiol. 2015 Feb;12(2): 143-50). Electronically Signed   By: Kevin  Herman M.D.   On: 04/22/2020 20:14   MR BRAIN WO CONTRAST  Result Date: 04/22/2020 CLINICAL DATA:  Acute neurological deficit. Progressive weakness, worse on the left. EXAM: MRI HEAD WITHOUT CONTRAST TECHNIQUE: Multiplanar, multiecho pulse sequences of the brain and surrounding structures were obtained without intravenous contrast. COMPARISON:  Head CT same day.  MRI 02/28/2020. FINDINGS: Brain: Diffusion imaging shows acute infarction affecting the right mid-posterior caudate an the superior thalamus. Single separate punctate acute infarction in the cortex of the medial right parietooccipital junction. Few small punctate acute infarctions in the right occipital cortex. 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 Elsewhere, there is chronic small vessel ischemic change affecting the hemispheric deep and subcortical white matter, right more than left. No focal insult affects the brainstem or cerebellum. No evidence of mass, hemorrhage, hydrocephalus or extra-axial collection Vascular: Major vessels at the base of the brain show flow. Skull and upper cervical spine: Negative Sinuses/Orbits: Clear/normal Other: None IMPRESSION: 1. Acute infarction affecting the right  mid-posterior caudate and right superior thalamus. Single separate punctate acute infarction in the cortex of the medial right parietooccipital junction. Few small punctate acute infarctions in the right occipital cortex. 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 No evidence of hemorrhage or mass effect. 2. Chronic small-vessel ischemic changes elsewhere throughout the brain as outlined above. Electronically Signed   By: Mark  Shogry M.D.   On: 04/22/2020 17:12   DG Chest Port 1 View  Result Date: 04/22/2020 CLINICAL DATA:  Weakness EXAM: PORTABLE CHEST 1 VIEW COMPARISON:  Portable exam 1345 hours compared to 12/21/2012 FINDINGS: Upper normal heart size post median sternotomy. Mediastinal contours and pulmonary vascularity normal. Subsegmental atelectasis versus scarring at LEFT base. Remaining lungs clear. No acute infiltrate, pleural effusion, or pneumothorax. Bones demineralized with note of a LEFT shoulder prosthesis and advanced RIGHT glenohumeral degenerative changes. IMPRESSION: Atelectasis versus scarring at LEFT base. Electronically Signed   By: Mark  Boles M.D.   On: 04/22/2020 14:01    PHYSICAL EXAM  Physical Exam  Constitutional: Well developed middle aged female lying in bed with eyes closed. NAD.  Psych: Affect appropriate to situation Eyes: No scleral injection HENT: No OP obstruction MSK: Arthritic changes of bilateral hands and toes are present Respiratory: No increased work of breathing  Skin: Flushed face. Purplish toes which patient reports is baseline secondary to her arthritis  Neuro: Mental Status: Patient is awake, alert, oriented to person, place, month, year, and situation. She arouses easily with verbal stimuli. Remains drowsy and requires constant verbal stimulation to continue in conversation.  Speech is clear and fluent.   Cranial Nerves: II: Visual Fields are full. Pupils are equal, round, and reactive to light.     III,IV, VI: EOMI  without ptosis or diploplia.  V: Facial sensation is symmetric to temperature VII: Facial movement is symmetric.  VIII: hearing is intact to voice X: Uvula elevates symmetrically XI: Shoulder shrug is symmetric. XII: tongue is midline without atrophy or fasciculations.  Motor: Tone is normal. Bulk is normal.  Right upper extremity 4/5 throughout, left upper extremity 4-/5 throughout, right lower extremity 5/5 throughout, left lower extremity 4/5 hip flexion, otherwise 5/5 throughout Sensory: Sensation is symmetric to light touch and temperature in the arms and legs. Plantars: Toes are downgoing bilaterally.  Cerebellar: FNF and HKS intact bilaterally  Slight left sided facial weakness.  Patient with generalized weakness of her upper and lower extremities, but more so on the left.  No Hoffman's bilat UE. Decreased grip on the left with pronator drift.  Patient is able to hold her right arm and leg up against gravity, but unable to do so for more than a few seconds on the left side.   ASSESSMENT/PLAN Madison Hurst is a 60 y.o. female with medical history significant of paroxysmal atrial tachycardia, CAD (newly diagnosed) with plan for evaluation by catheterization this month, hypertension, hyperlipidemia, diabetes mellitus type 2, rheumatoid arthritis, anxiety, and depression presents with complaints of worsening weakness on the left side most notably in the last week.    Stroke - right MCA/PCA, PCA, thalamus, right PLIC and CR scattered small/punctate infarcts, concerning for cardio-embolic source  CT head No acute abnormality.Small vessel disease. Atrophy.  CTA head & neck:  No emergent large vessel occlusion. Severe stenosis of the right PCA P3 segment over a length of 48m. Mild stenosis of the left PCA P3 segment.  MRI Brain: Acute infarction affecting the right mid-posterior caudate and right superior thalamus. Single separate punctate acute infarction in the cortex of  the medial right parietooccipital junction. Few small punctate acute infarctions in the right occipital cortex.   LE venous Doppler: No evidence of DVT  2D Echo PENDING  TEE PENDING  Recommend long term cardiac monitoring to rule out afib  LDL 88  HgbA1c 6.1  UDS positive for opioid which is appropriate as she takes Vicodin for RA pain  VTE prophylaxis is recommended  No AC prior to admission, now on ASA 854mand plavix daily DAPT. Plan for ASA 812mnd Plavix 24m5m3 months then ASA 81mg66motherapy given right PCA high grade stenosis  Therapy recommendations:  TBD  Disposition:  TBD  Recently diagnosed CAD and SVT  Recently started on metoprolol XL 12.5 mg daily for abnormal rhythm, and started on ASA 81mg 36mCrestor as well this month.   Additionally she had an echocardiogram on 03/11/2020 which demonstrated reduced ejection fraction (40 to 45%) with mild concentric LVH and grade 1 diastolic dysfunction as well as mildly dilated left atrium.   recent coronary CT with markedly elevated calcium score and cardiac cath was planned for this month   Zio patch 3-7 days in 02/2020 showed AT with variable block and frequent PACs  EP contacted for consideration of LOOP recorder placement.   Hypertension  Home meds:  Micardis 20mg d85m  Stable on admission . Long-term BP goal normotensive  Hyperlipidemia  Home meds:  20mg Cr29mr started two weeks ago. HOLD due to acute transaminitis.  LDL 88, goal < 70  Continue statin at discharge if appropriate   Diabetes type II Controlled  Home meds:  Metformin (on hold)  HgbA1c 6.1, goal < 7.0  CBGs  SSI sensitive  Acute transaminitis  Alkaline phosphatase 162,   AST/ALT  65/62->203/121   Statin on hold  Management per primary team   Other Stroke Risk Factors  Former cigarette smoker:  reports that she quit smoking about 26 years ago. She has never used smokeless tobacco.   Other Active Problems managed by  primary team:  RA with chronic pain managed by opioids : on Orencia injections weekly and prednisone 5mg daily along with Vicodin for pain control. NSAIDS on hold due to bleeding risk. ESR WNL.  Concern for possible cervical myelopathy: Generalized weakness/hyperreflexia and falls prompted evaluation by cervical MRI which is pending (per tech patient could not cooperate with immobility for testing).   Leukocytosis/SIRS: WBCs 13 on admission. Stable today. No clear source at present. Afebrile. UA negative for infection.   Hypothyroidism: TSH 0.74 on admission, home levothyroxine 76 mcg daily continued.   Hospital day # 1  This plan of care was discussed with and directed by Dr. Lakela Kuba.  Madison A Bailey-Modzik, NP-C  ATTENDING NOTE: I reviewed above note and agree with the assessment and plan. Pt was seen and examined.   59-year-old female with history of paroxysmal atrial tachycardia, CAD, hypertension, hyperlipidemia, diabetes, rheumatoid arthritis on steroids, anemia admitted for left-sided weakness for 2 weeks with worsening.  CT no acute abnormality.  MRI showed right MCA/PCA, right PCA, right thalamus punctate infarcts.  Also, right internal capsule and CR scattered infarcts.  CTA head and neck right P3 severe stenosis.  LE venous Doppler no DVT.  2D echo pending.  ESR negative.  A1c 6.1, LDL 88.  Patient does have transaminitis and AST/ALT trending up.  WBC 13.2.  On exam, patient awake alert, orientated to place and age, however stated month wrong.  No aphasia, slight dysarthria, able to name and repeat and follow simple commands.  Cranial nerves intact.  Left upper extremity decreased strength with drift but not hitting bed.  Lateral lower extremity equal strengths.  Sensation symmetrical.  Finger-to-nose bilaterally intact although slow on the left.  Patient stroke multifocal, concerning for embolic source.  Given patient history of arrhythmia, concerning for occult A. fib.  EKG x2 showed  AT with irregular rate.  Recommend further cardioembolic work-up with TEE and loop recorder, and EP consult.  Currently on aspirin Plavix DAPT.  Hold off statin due to transaminitis.  PT/OT.  Discussed with Dr. Austria.  Will follow.  Densel Kronick, MD PhD Stroke Neurology 04/23/2020 2:19 PM  I spent  35 minutes in total face-to-face time with the patient, more than 50% of which was spent in counseling and coordination of care, reviewing test results, images and medication, and discussing the diagnosis, treatment plan and potential prognosis. This patient's care requiresreview of multiple databases, neurological assessment, discussion with family, other specialists and medical decision making of high complexity.  To contact Stroke Continuity provider, please refer to [email or web address]. After hours, contact General Neurology  

## 2020-04-23 NOTE — Consult Note (Signed)
Cardiology Consultation:   Patient ID: Madison Hurst MRN: [id number]; DOB: 01-Jun-1960  Admit date: 04/22/2020 Date of Consult: 04/24/2020  Primary Care Provider: Buckner Malta, MD [facility] Cardiologist: Thomasene Ripple, DO  [facility] Electrophysiologist:  None    Patient Profile:   Madison Hurst is a 61 y.o. female with a hx of PAT, CAD, HTN, HLD, DM2, RA who is being seen today for the evaluation of palpitations and atrial tachycardia as well as abnormal coronary CTA at the request of Madison Flavors, MD.  History of Present Illness:   Ms. Madison Hurst with PMH of  paroxysmal atrial tachycardia, ASCAD, hypertension, hyperlipidemia, diabetes mellitus type 2, rheumatoid arthritis, anxiety, and depression presented to ER with complaints of worsening weakness on the left side most notably in the last week.  In the last 2 days patient reported that she had 3 falls.  When she fell yesterday she did hit her head, but not badly. Patient reported that she had been feeling as though she could pass out, but had never lost consciousness.  She has felt weak all over, but the left side seems weaker than the right.  Notes that she has been having associated symptoms of headaches and these intermittent episodes of breaking out in sweats that she reports have actually been going on for a year now.    On admission to the ER her HR was 125bpm with BP 125/102-143/126 mmHg, CT of head with no acute abnormality, TSH 0.74, COVID neg and MRI showed acute CVA in the posterior circulation c/w embolic source with no signs of hemorrhage.  Given IV Lopressor for HR control. EKG was consistent with atrial tachycardia.   She apparently had a recent coronary CTA showing mild 25-49% pRCA, soft plaque in mid RCA with high risk appearance and > 70% soft plaque in the dRCA, moderate mLAD with 50-69% stenosis and mid Ramus with 50-69% stenosis as well as non dominant LCx with moderate lesions in  OM1. FFRct shows multiple vessels with hemodynamically significant flow limiting stenosis. Recommended Cardiac catheterization.  She is very lethargic and cannot give a hx.  Her husband tells me that she sleeps a lot during the day and he is her main caregiver.  She has had SOB although he does not know if she has had chest pain.  He tells me that she developed a headache and weakness over the past week on the left side and breaking out in sweats.  She has had 3 falls in the past week and on day of admit fell and hit her head.  She has had some dizziness and presyncope but no syncope.     Past Medical History:  Diagnosis Date  . Anemia 02/09/2020  . Anxiety 10/28/2019  . Depressed left ventricular ejection fraction 03/13/2020  . Depressive disorder 10/28/2019  . DOE (dyspnea on exertion) 03/13/2020  . Hypothyroidism 02/09/2020  . Iron deficiency anemia 01/24/2020  . Iron deficiency anemia, unspecified   . Metabolic syndrome 03/13/2020  . Multinodular goiter 03/07/2015  . PAC (premature atrial contraction) 03/13/2020  . PAT (paroxysmal atrial tachycardia) (HCC) 03/13/2020  . Pericardial effusion 02/09/2020  . 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 Type 2 diabetes mellitus (HCC) 05/08/2019  . Vitamin D deficiency 02/09/2020    Past Surgical History:  Procedure Laterality Date  . CESAREAN SECTION    . CHOLECYSTECTOMY    . other     Growth  removal on chest  . TOTAL SHOULDER ARTHROPLASTY Left      Home Medications:  Prior to Admission medications   Medication Sig Start Date End Date Taking? Authorizing Provider  Abatacept (ORENCIA [place]) Inject into the skin once a week.   Yes [provider]  aspirin EC 81 MG tablet Take 1 tablet (81 mg total) by mouth daily. Swallow whole. 04/12/20  Yes Tobb, Kardie, DO  buPROPion (WELLBUTRIN XL) 150 MG 24 hr tablet Take 150 mg by mouth daily. 02/09/20   Yes [provider]  DULoxetine (CYMBALTA) 60 MG capsule 60 mg. 02/14/20  Yes [provider]  ergocalciferol (VITAMIN D2) 1.25 MG (50000 UT) capsule Take 50,000 Units by mouth once a week.   Yes [provider]  flurbiprofen (ANSAID) 100 MG tablet Take 100 mg by mouth 2 (two) times daily. 01/09/20  Yes [provider]  folic acid (FOLVITE) 1 MG tablet Take 2 mg by mouth daily.   Yes [provider]  HYDROcodone-acetaminophen (NORCO/VICODIN) 5-325 MG tablet Take 1 tablet by mouth every 6 (six) hours as needed for moderate pain. 02/09/20  Yes [provider]  levothyroxine (SYNTHROID) 75 MCG tablet Take 75 mcg by mouth daily before breakfast.   Yes [provider]  metFORMIN (GLUMETZA) 500 MG (MOD) 24 hr tablet Take 500 mg by mouth daily with breakfast.   Yes [provider]  metoprolol succinate (TOPROL XL) 25 MG 24 hr tablet Take 0.5 tablets (12.5 mg total) by mouth daily. 03/13/20  Yes Tobb, Kardie, DO  Multiple Vitamin (MULTIVITAMIN) capsule Take 1 capsule by mouth daily.   Yes [provider]  ORENCIA CLICKJECT 125 MG/ML SOAJ Inject 125 mg into the skin once a week. 01/24/20  Yes [provider]  predniSONE (DELTASONE) 5 MG tablet Take 5 mg by mouth daily. 03/20/20  Yes [provider]  rosuvastatin (CRESTOR) 20 MG tablet Take 1 tablet (20 mg total) by mouth daily. 04/12/20 07/11/20 Yes Tobb, Kardie, DO  telmisartan (MICARDIS) 20 MG tablet Take 20 mg by mouth daily. 01/30/20  Yes [provider]    Inpatient Medications: Scheduled Meds: . [MAR Hold] aspirin EC  81 mg Oral Daily  . [MAR Hold] buPROPion  150 mg Oral Daily  . [MAR Hold] clopidogrel  75 mg Oral Daily  . [MAR Hold] DULoxetine  60 mg Oral Daily  . [MAR Hold] enoxaparin (LOVENOX) injection  40 mg Subcutaneous Q24H  . [MAR Hold] folic acid  2 mg Oral Daily  . [MAR Hold] insulin aspart  0-5 Units Subcutaneous QHS  . [MAR Hold]  insulin aspart  0-9 Units Subcutaneous TID WC  . [MAR Hold] irbesartan  75 mg Oral Daily  . [MAR Hold] levothyroxine  75 mcg Oral QAC breakfast  . [MAR Hold] metoprolol succinate  12.5 mg Oral Daily  . [MAR Hold] potassium chloride  20 mEq Oral Once  . [MAR Hold] predniSONE  5 mg Oral Daily   Continuous Infusions: . sodium chloride    . [MAR Hold] magnesium sulfate bolus IVPB     PRN Meds: [MAR Hold] acetaminophen **OR** [MAR Hold] acetaminophen (TYLENOL) oral liquid 160 mg/5 mL **OR** [MAR Hold] acetaminophen, [MAR Hold] HYDROcodone-acetaminophen, [MAR Hold] LORazepam  Allergies:    Allergies  Allergen Reactions  . Sulfa Antibiotics   . Sulfamethoxazole-Trimethoprim Rash    Social History:   Social History   Socioeconomic History  . Marital status: Married    Spouse name: Not on file  . Number  of children: Not on file  . Years of education: Not on file  . Highest education level: Not on file  Occupational History  . Not on file  Tobacco Use  . Smoking status: Former Smoker    Quit date: 02/11/1994    Years since quitting: 26.2  . Smokeless tobacco: Never Used  Vaping Use  . Vaping Use: Never used  Substance and Sexual Activity  . Alcohol use: Not on file  . Drug use: Not on file  . Sexual activity: Not on file  Other Topics Concern  . Not on file  Social History Narrative  . Not on file   Social Determinants of Health   Financial Resource Strain: Not on file  Food Insecurity: Not on file  Transportation Needs: Not on file  Physical Activity: Not on file  Stress: Not on file  Social Connections: Not on file  Intimate Partner Violence: Not on file    Family History:    Family History  Problem Relation Age of Onset  . Muscular dystrophy Mother   . Diabetes Father   . Diabetes Brother   . Heart disease Brother      ROS:  Please see the history of present illness.   All other ROS reviewed and negative.     Physical Exam/Data:   Vitals:    04/23/20 1931 04/23/20 2312 04/24/20 0323 04/24/20 0710  BP: (!) 114/93 (!) 122/95 (!) 139/94 (!) 126/95  Pulse: 99 95 95 90  Resp: 18 16 18 20   Temp: 98 F (36.7 C) 98.2 F (36.8 C) 98 F (36.7 C) 97.7 F (36.5 C)  TempSrc: Oral Oral Oral Oral  SpO2: 98% 100% 99% 100%  Weight:      Height:        Intake/Output Summary (Last 24 hours) at 04/24/2020 0758 Last data filed at 04/24/2020 0324 Gross per 24 hour  Intake 240 ml  Output 400 ml  Net -160 ml   Last 3 Weights 04/23/2020 04/22/2020 03/13/2020  Weight (lbs) 128 lb 12 oz 123 lb 123 lb 3.2 oz  Weight (kg) 58.4 kg 55.792 kg 55.883 kg     Body mass index is 25.14 kg/m.  General: ill appearing and very lethargic HEENT: normal Lymph: no adenopathy Neck: no JVD Endocrine:  No thryomegaly Vascular: No carotid bruits; FA pulses 2+ bilaterally without bruits  Cardiac:  normal S1, S2; irregular; no murmur  Lungs:  clear to auscultation bilaterally, no wheezing, rhonchi or rales  Abd: soft, nontender, no hepatomegaly  Ext: no edema Musculoskeletal:  No deformities, BUE and BLE strength normal and equal Skin: warm and dry  Neuro: cannot assess Psych: cannot assess  EKG:  The EKG was personally reviewed and demonstrates: sinus tachycardia with PACs Telemetry:  Telemetry was personally reviewed and demonstrates:    Relevant CV Studies: Coronary CTA 04/11/2020 Aorta: Normal size.  No calcifications.  No dissection.  Aortic Valve:  Trileaflet.  No calcifications.  Coronary calcium score: 1012  Coronary Arteries:  Normal coronary origin.  Right dominance.  RCA is a large dominant artery that gives rise to PDA and PLVB. There is a Proximal mild (25-49%) calcified plaque. The mid RDA with a soft plaque with high risk appearance (napkin ring). The mid to distal RCA with diffuse mild mixed lesions. The distal RCA with a severe (>70) soft plaque.  Left main is a large artery that gives rise to LAD, Ramus Intermedius and LCX  arteries.  LAD is a large vessel. The  proximal LAD with no plaque. The mid LAD with a moderate (50-69%) mixed lesion that as it terminates appears to look >70%.  Ramus is a medium sized vessel. There is no plaques in the proximal portion. The mid Ramus with moderate (50-69%) lesion.  LCX is a non-dominant artery that gives rise to one large OM1 branch. There is proximal to mid segmental moderate lesions.  Other findings:  Normal pulmonary vein drainage into the left atrium.  Normal left atrial appendage without a thrombus.  Normal size of the pulmonary artery.  IMPRESSION: 1. Coronary calcium score of 1012. This was 55 percentile for age and sex matched control.  2. Normal coronary origin with right dominance.  3. Multivessel CAD. CADRADS 4A. This study will be send for FFRct. Consider cardiac catheterization.  Coronary FFR1/13/2022 FINDINGS: FFRct analysis was performed on the original cardiac CT angiogram dataset. Diagrammatic representation of the FFRct analysis is provided in a separate PDF document in PACS. This dictation was created using the PDF document and an interactive 3D model of the results. 3D model is not available in the EMR/PACS. Normal FFR range is >0.80.  1. Left Main: 0.99  2. ZOX:WRUEAVWU 0.96, Mid 0.54, Distal <0.50  3. LCX: This vessel is not well analyzed.  4. Ramus: Proximal 0.98, Mid 0.82, Distal 0.70  1. RCA: Proximal 0.96, Mid 0.92, Distal 0.55  IMPRESSION: FFRct shows multiple vessels with hemodynamically significant flow limiting stenosis. Recommend Cardiac catheterization.  2D echo 03/11/2020 IMPRESSIONS   1. Left ventricular ejection fraction, by estimation, is 40 to 45%. The  left ventricle has mildly decreased function. The left ventricle has no  regional wall motion abnormalities. There is mild concentric left  ventricular hypertrophy. Left ventricular  diastolic parameters are consistent with Grade I  diastolic dysfunction  (impaired relaxation).  2. Right ventricular systolic function is normal. The right ventricular  size is normal. There is normal pulmonary artery systolic pressure.  3. Left atrial size was mildly dilated.  4. The mitral valve is normal in structure. Trivial mitral valve  regurgitation. No evidence of mitral stenosis.  5. The aortic valve is normal in structure. Aortic valve regurgitation is  not visualized. No aortic stenosis is present.  6. The inferior vena cava is normal in size with greater than 50%  respiratory variability, suggesting right atrial pressure of 3 mmHg.   Comparison(s): No prior Echocardiogram.   Laboratory Data:  High Sensitivity Troponin:  No results for input(s): TROPONINIHS in the last 720 hours.   Chemistry Recent Labs  Lab 04/22/20 1342 04/23/20 0337 04/24/20 0114  NA 137 136 138  K 3.7 3.5 3.8  CL 98 99 101  CO2 GLUCOSE 173* 127* 131*  BUN CREATININE 0.59 0.64 0.55  CALCIUM 8.6* 8.0* 8.3*  GFRNONAA >60 >60 >60  ANIONGAP Recent Labs  Lab 04/22/20 1342 04/23/20 0337 04/24/20 0114  PROT 5.3* 5.3* 5.1*  ALBUMIN 2.7* 2.7* 2.6*  AST 65* 203* 569*  ALT 62* 121* 313*  ALKPHOS 162* 218* 312*  BILITOT 0.9 1.1 1.1   Hematology Recent Labs  Lab 04/22/20 1342 04/23/20 0337 04/24/20 0114  WBC 13.2* 13.0* 12.5*  RBC 4.86 4.66 4.16  HGB 14.9 14.5 13.5  HCT 49.0* 47.7* 42.5  MCV 100.8* 102.4* 102.2*  MCH 30.7 31.1 32.5  MCHC 30.4 30.4 31.8  RDW 16.9* 17.2* 17.2*  PLT 342 319 292   BNPNo results for input(s): BNP, PROBNP  in the last 168 hours.  DDimer No results for input(s): DDIMER in the last 168 hours.   Radiology/Studies:  CT Code Stroke CTA Head W/WO contrast  Result Date: 04/22/2020 CLINICAL DATA:  Worsening left-sided weakness for 2 weeks. Recent TIA. Fall today. Slurred speech. EXAM: CT ANGIOGRAPHY HEAD AND NECK TECHNIQUE: Multidetector CT imaging of the head and neck was  performed using the standard protocol during bolus administration of intravenous contrast. Multiplanar CT image reconstructions and MIPs were obtained to evaluate the vascular anatomy. Carotid stenosis measurements (when applicable) are obtained utilizing NASCET criteria, using the distal internal carotid diameter as the denominator. CONTRAST:  75mL OMNIPAQUE IOHEXOL 350 MG/ML SOLN COMPARISON:  Brain MRI 04/22/2020 Head CT 04/22/2020 FINDINGS: CTA NECK FINDINGS SKELETON: There is no bony spinal canal stenosis. No lytic or blastic lesion. OTHER NECK: Normal pharynx, larynx and major salivary glands. No cervical lymphadenopathy. Enlarged thyroid gland. UPPER CHEST: Small left pleural effusion with fluid in the fissure. AORTIC ARCH: There is no calcific atherosclerosis of the aortic arch. There is no aneurysm, dissection or hemodynamically significant stenosis of the visualized portion of the aorta. Conventional 3 vessel aortic branching pattern. The visualized proximal subclavian arteries are widely patent. RIGHT CAROTID SYSTEM: Normal without aneurysm, dissection or stenosis. LEFT CAROTID SYSTEM: No dissection, occlusion or aneurysm. Mild atherosclerotic calcification at the carotid bifurcation without hemodynamically significant stenosis. VERTEBRAL ARTERIES: Codominant configuration. Both origins are clearly patent. There is no dissection, occlusion or flow-limiting stenosis to the skull base (V1-V3 segments). CTA HEAD FINDINGS POSTERIOR CIRCULATION: --Vertebral arteries: Normal V4 segments. --Inferior cerebellar arteries: Normal. --Basilar artery: Normal. --Superior cerebellar arteries: Normal. --Posterior cerebral arteries (PCA): Severe stenosis of the right PCA P3 segment over a length 7 mm (12:17). The distal right PCA is normal. Mild stenosis of the left P3 segment in a similar location (12:24). ANTERIOR CIRCULATION: --Intracranial internal carotid arteries: Normal. --Anterior cerebral arteries (ACA): Normal.  Hypoplastic right A1 segment, normal variant. --Middle cerebral arteries (MCA): Normal. VENOUS SINUSES: As permitted by contrast timing, patent. Incidentally noted gas in the cavernous sinuses related to contrast injection. ANATOMIC VARIANTS: None Review of the MIP images confirms the above findings. IMPRESSION: 1. No emergent large vessel occlusion. 2. 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 3. Mild stenosis of the left PCA P3 segment. 4. Small left pleural effusion with fluid in the fissure. 5. Enlarged thyroid gland. This has been evaluated on previous imaging. (ref: J Am Coll Radiol. 2015 Feb;12(2): 143-50). Electronically Signed   By: Deatra Robinson M.D.   On: 04/22/2020 20:14   CT Head Wo Contrast  Result Date: 04/22/2020 CLINICAL DATA:  Tube deficit. Progressive weakness, greater on the left. Fell today. EXAM: CT HEAD WITHOUT CONTRAST TECHNIQUE: Contiguous axial images were obtained from the base of the skull through the vertex without intravenous contrast. COMPARISON:  02/16/2020 FINDINGS: Brain: No evidence of acute infarction, hemorrhage, hydrocephalus, extra-axial collection or mass lesion/mass effect. Subcortical and periventricular white matter hypodensity most likely result of chronic ischemic small vessel disease. Similar findings can be seen with demyelinating disease of the white matter. Diffuse cortical atrophy again seen. Vascular: No hyperdense vessel or unexpected calcification. Skull: Normal. Negative for fracture or focal lesion. Sinuses/Orbits: No acute finding. Other: None. IMPRESSION: No acute intracranial abnormality. Electronically Signed   By: Acquanetta Belling M.D.   On: 04/22/2020 14:29   CT Code Stroke CTA Neck W/WO contrast  Result Date: 04/22/2020 CLINICAL DATA:  Worsening left-sided weakness for  2 weeks. Recent TIA. Fall today. Slurred speech. EXAM: CT ANGIOGRAPHY HEAD AND NECK TECHNIQUE: Multidetector CT imaging of the head and neck was performed using  the standard protocol during bolus administration of intravenous contrast. Multiplanar CT image reconstructions and MIPs were obtained to evaluate the vascular anatomy. Carotid stenosis measurements (when applicable) are obtained utilizing NASCET criteria, using the distal internal carotid diameter as the denominator. CONTRAST:  75mL OMNIPAQUE IOHEXOL 350 MG/ML SOLN COMPARISON:  Brain MRI 04/22/2020 Head CT 04/22/2020 FINDINGS: CTA NECK FINDINGS SKELETON: There is no bony spinal canal stenosis. No lytic or blastic lesion. OTHER NECK: Normal pharynx, larynx and major salivary glands. No cervical lymphadenopathy. Enlarged thyroid gland. UPPER CHEST: Small left pleural effusion with fluid in the fissure. AORTIC ARCH: There is no calcific atherosclerosis of the aortic arch. There is no aneurysm, dissection or hemodynamically significant stenosis of the visualized portion of the aorta. Conventional 3 vessel aortic branching pattern. The visualized proximal subclavian arteries are widely patent. RIGHT CAROTID SYSTEM: Normal without aneurysm, dissection or stenosis. LEFT CAROTID SYSTEM: No dissection, occlusion or aneurysm. Mild atherosclerotic calcification at the carotid bifurcation without hemodynamically significant stenosis. VERTEBRAL ARTERIES: Codominant configuration. Both origins are clearly patent. There is no dissection, occlusion or flow-limiting stenosis to the skull base (V1-V3 segments). CTA HEAD FINDINGS POSTERIOR CIRCULATION: --Vertebral arteries: Normal V4 segments. --Inferior cerebellar arteries: Normal. --Basilar artery: Normal. --Superior cerebellar arteries: Normal. --Posterior cerebral arteries (PCA): Severe stenosis of the right PCA P3 segment over a length 7 mm (12:17). The distal right PCA is normal. Mild stenosis of the left P3 segment in a similar location (12:24). ANTERIOR CIRCULATION: --Intracranial internal carotid arteries: Normal. --Anterior cerebral arteries (ACA): Normal. Hypoplastic right  A1 segment, normal variant. --Middle cerebral arteries (MCA): Normal. VENOUS SINUSES: As permitted by contrast timing, patent. Incidentally noted gas in the cavernous sinuses related to contrast injection. ANATOMIC VARIANTS: None Review of the MIP images confirms the above findings. IMPRESSION: 1. No emergent large vessel occlusion. 2. 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 3. Mild stenosis of the left PCA P3 segment. 4. Small left pleural effusion with fluid in the fissure. 5. Enlarged thyroid gland. This has been evaluated on previous imaging. (ref: J Am Coll Radiol. 2015 Feb;12(2): 143-50). Electronically Signed   By: Deatra Robinson M.D.   On: 04/22/2020 20:14   MR BRAIN WO CONTRAST  Result Date: 04/22/2020 CLINICAL DATA:  Acute neurological deficit. Progressive weakness, worse on the left. EXAM: MRI HEAD WITHOUT CONTRAST TECHNIQUE: Multiplanar, multiecho pulse sequences of the brain and surrounding structures were obtained without intravenous contrast. COMPARISON:  Head CT same day.  MRI 02/28/2020. FINDINGS: Brain: Diffusion imaging shows acute infarction affecting the right mid-posterior caudate an the superior thalamus. Single separate punctate acute infarction in the cortex of the medial right parietooccipital junction. Few small punctate acute infarctions in the right occipital cortex. 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 Elsewhere, there is chronic small vessel ischemic change affecting the hemispheric deep and subcortical white matter, right more than left. No focal insult affects the brainstem or cerebellum. No evidence of mass, hemorrhage, hydrocephalus or extra-axial collection Vascular: Major vessels at the base of the brain show flow. Skull and upper cervical spine: Negative Sinuses/Orbits: Clear/normal Other: None IMPRESSION: 1. Acute infarction affecting the right mid-posterior caudate and right superior thalamus. Single separate punctate  acute infarction in the cortex of the medial right parietooccipital junction. Few small punctate acute infarctions in the right occipital cortex.  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 No evidence of hemorrhage or mass effect. 2. Chronic small-vessel ischemic changes elsewhere throughout the brain as outlined above. Electronically Signed   By: Paulina Fusi M.D.   On: 04/22/2020 17:12   MR CERVICAL SPINE WO CONTRAST  Result Date: 04/23/2020 CLINICAL DATA:  Myelopathy, acute or progressive. Additional history provided: Patient reports worsening weakness on the left side, most notably within the last week, 3 falls in the last 2 days weakness all over, but the left-sided seems weaker than the right. EXAM: MRI CERVICAL SPINE WITHOUT CONTRAST TECHNIQUE: Multiplanar, multisequence MR imaging of the cervical spine was performed. No intravenous contrast was administered. COMPARISON:  CT head/neck 04/22/2020.  CT cervical spine 01/26/2015. FINDINGS: All sequences are moderate to severely motion degraded, significantly limiting evaluation. Alignment: Straightening of the expected cervical lordosis. No significant spondylolisthesis. Vertebrae: No significant marrow edema or focal suspicious osseous Lea is identified. Cord: Motion degradation precludes adequate evaluation for spinal cord signal abnormality. Posterior Fossa, vertebral arteries, paraspinal tissues: Cerebellar atrophy. No acute abnormality identified within included portions of the posterior fossa. Cervical vertebral artery flow voids poorly assessed due to motion degradation. No paraspinal soft tissue abnormality is identified. Disc levels: Multilevel disc degeneration. Most notably, there is moderate disc degeneration at the C4-C5, C5-C6 and C6-C7 levels. C2-C3: Disc bulge. Facet hypertrophy. No significant spinal canal stenosis. Mild bilateral neural foraminal narrowing. C3-C4: Disc bulge. Bilateral disc osteophyte ridge/uncinate  hypertrophy (greater on the right). Facet and ligamentum flavum hypertrophy. Apparent moderate spinal canal stenosis. Bilateral neural foraminal narrowing (severe right, mild left). C4-C5: Posterior disc osteophyte complex. Uncovertebral, facet and ligamentum flavum hypertrophy. Apparent moderate/severe spinal canal stenosis. Bilateral neural foraminal narrowing (moderate/severe right, moderate left). C5-C6: Posterior disc osteophyte complex. Uncovertebral, facet and ligamentum flavum hypertrophy. Apparent moderate/severe spinal canal stenosis. Bilateral neural foraminal narrowing (moderate right, moderate/severe left). C6-C7: Posterior disc osteophyte complex. Uncovertebral, facet and ligamentum flavum hypertrophy. Apparent moderate spinal canal stenosis. Bilateral neural foraminal narrowing (moderate/severe right, moderate left). C7-T1: Mild endplate spurring on the left. Mild facet hypertrophy (greater on the left). No appreciable disc herniation or spinal canal stenosis. Mild left neural foraminal narrowing. IMPRESSION: All acquired sequences are moderate to severely motion degraded, significantly limiting evaluation. A repeat examination should be considered when the patient is better able to tolerate the study. Cervical spondylosis as outlined. Most notably, there is apparent moderate/severe spinal canal stenosis at C4-C5 and C5-C6, and apparent moderate spinal canal stenosis at C3-C4 and C6-C7. Motion degradation limits evaluation for subtle spinal cord mass effect and precludes adequate evaluation for spinal cord signal abnormality. Multilevel neural foraminal narrowing as detailed and greatest on the right at C3-C4 (severe), on the right at C4-C5 (moderate/severe), on the left at C5-C6 (moderate/severe) and on the right at C6-C7 (moderate/severe). Electronically Signed   By: Jackey Loge DO   On: 04/23/2020 12:27   DG Chest Port 1 View  Result Date: 04/22/2020 CLINICAL DATA:  Weakness EXAM: PORTABLE  CHEST 1 VIEW COMPARISON:  Portable exam 1345 hours compared to 12/21/2012 FINDINGS: Upper normal heart size post median sternotomy. Mediastinal contours and pulmonary vascularity normal. Subsegmental atelectasis versus scarring at LEFT base. Remaining lungs clear. No acute infiltrate, pleural effusion, or pneumothorax. Bones demineralized with note of a LEFT shoulder prosthesis and advanced RIGHT glenohumeral degenerative changes. IMPRESSION: Atelectasis versus scarring at LEFT base. Electronically Signed   By: Ulyses Southward M.D.   On: 04/22/2020 14:01   ECHOCARDIOGRAM COMPLETE  Result Date: 04/23/2020  ECHOCARDIOGRAM REPORT   Patient Name:   Madison Hurst Date of Exam: 04/23/2020 Medical Rec #:  [id number]                     Height:       60.0 in Accession #:    [id number]                    Weight:       123.0 lb Date of Birth:  1960-04-30                    BSA:          1.518 m Patient Age:    59 years                      BP:           115/102 mmHg Patient Gender: F                             HR:           107 bpm. Exam Location:  Inpatient Procedure: 2D Echo and Color Doppler Indications:    Stroke 434.91 / I163.9  History:        Patient has prior history of Echocardiogram examinations, most                 recent 03/11/2020. Signs/Symptoms:Dyspnea; Risk                 Factors:Diabetes. Hypothyroidism.  Sonographer:    Tiffany Dance Referring Phys: [id number] RONDELL A SMITH IMPRESSIONS  1. Left ventricular ejection fraction, by estimation, is 20 to 25%. The left ventricle has severely decreased function. The left ventricle demonstrates global hypokinesis. Left ventricular diastolic parameters are indeterminate.  2. Right ventricular systolic function is normal. The right ventricular size is normal.  3. Left atrial size was moderately dilated.  4. The mitral valve is normal in structure. Mild mitral valve regurgitation. No evidence of mitral stenosis.  5. The aortic valve is tricuspid.  Aortic valve regurgitation is not visualized. No aortic stenosis is present.  6. The inferior vena cava is normal in size with greater than 50% respiratory variability, suggesting right atrial pressure of 3 mmHg. FINDINGS  Left Ventricle: Left ventricular ejection fraction, by estimation, is 20 to 25%. The left ventricle has severely decreased function. The left ventricle demonstrates global hypokinesis. The left ventricular internal cavity size was normal in size. There is no left ventricular hypertrophy. Left ventricular diastolic parameters are indeterminate. Right Ventricle: The right ventricular size is normal. Right ventricular systolic function is normal. Left Atrium: Left atrial size was moderately dilated. Right Atrium: Right atrial size was normal in size. Pericardium: There is no evidence of pericardial effusion. Mitral Valve: The mitral valve is normal in structure. Mild mitral annular calcification. Mild mitral valve regurgitation. No evidence of mitral valve stenosis. Tricuspid Valve: The tricuspid valve is normal in structure. Tricuspid valve regurgitation is trivial. No evidence of tricuspid stenosis. Aortic Valve: The aortic valve is tricuspid. Aortic valve regurgitation is not visualized. No aortic stenosis is present. Pulmonic Valve: The pulmonic valve was grossly normal. Pulmonic valve regurgitation is not visualized. No evidence of pulmonic stenosis. Aorta: The aortic root is normal in size and structure. Venous: The inferior vena cava is normal in size with greater than 50% respiratory variability, suggesting right atrial  pressure of 3 mmHg. IAS/Shunts: No atrial level shunt detected by color flow Doppler.  LEFT VENTRICLE PLAX 2D LVIDd:         4.70 cm LVIDs:         4.00 cm LV PW:         1.20 cm LV IVS:        0.70 cm LVOT diam:     1.90 cm LV SV:         19 LV SV Index:   13 LVOT Area:     2.84 cm  RIGHT VENTRICLE            IVC RV Basal diam:  2.20 cm    IVC diam: 2.10 cm RV S prime:      6.42 cm/s TAPSE (M-mode): 1.2 cm LEFT ATRIUM             Index       RIGHT ATRIUM           Index LA diam:        5.10 cm 3.36 cm/m  RA Area:     12.90 cm LA Vol (A2C):   68.6 ml 45.19 ml/m RA Volume:   27.30 ml  17.98 ml/m LA Vol (A4C):   64.6 ml 42.55 ml/m LA Biplane Vol: 67.3 ml 44.33 ml/m  AORTIC VALVE LVOT Vmax:   48.75 cm/s LVOT Vmean:  30.800 cm/s LVOT VTI:    0.068 m  AORTA Ao Root diam: 2.70 cm Ao Asc diam:  3.40 cm MITRAL VALVE MV Area (PHT): 5.13 cm    SHUNTS MV Decel Time: 148 msec    Systemic VTI:  0.07 m MV E velocity: 92.40 cm/s  Systemic Diam: 1.90 cm MV A velocity: 71.10 cm/s MV E/A ratio:  1.30 Olga Millers MD Electronically signed by Olga Millers MD Signature Date/Time: 04/23/2020/2:25:22 PM    Final    VAS [place] LOWER EXTREMITY VENOUS (DVT)  Result Date: 04/23/2020  Lower Venous DVT Study Indications: SOB.  Risk Factors: None identified. Anticoagulation: Lovenox. Limitations: Patient movement. Comparison Study: No previous exams Performing Technologist: Clint Guy RVT  Examination Guidelines: A complete evaluation includes B-mode imaging, spectral Doppler, color Doppler, and power Doppler as needed of all accessible portions of each vessel. Bilateral testing is considered an integral part of a complete examination. Limited examinations for reoccurring indications may be performed as noted. The reflux portion of the exam is performed with the patient in reverse Trendelenburg.  +---------+---------------+---------+-----------+----------+--------------+ RIGHT    CompressibilityPhasicitySpontaneityPropertiesThrombus Aging +---------+---------------+---------+-----------+----------+--------------+ CFV      Full           Yes      Yes                                 +---------+---------------+---------+-----------+----------+--------------+ SFJ      Full                                                         +---------+---------------+---------+-----------+----------+--------------+ FV Prox  Full                                                        +---------+---------------+---------+-----------+----------+--------------+  FV Mid   Full                                                        +---------+---------------+---------+-----------+----------+--------------+ FV DistalFull                                                        +---------+---------------+---------+-----------+----------+--------------+ PFV      Full                                                        +---------+---------------+---------+-----------+----------+--------------+ POP      Full           Yes      Yes                                 +---------+---------------+---------+-----------+----------+--------------+ PTV      Full                                                        +---------+---------------+---------+-----------+----------+--------------+ PERO     Full                                                        +---------+---------------+---------+-----------+----------+--------------+   +---------+---------------+---------+-----------+----------+--------------+ LEFT     CompressibilityPhasicitySpontaneityPropertiesThrombus Aging +---------+---------------+---------+-----------+----------+--------------+ CFV      Full           Yes      Yes                                 +---------+---------------+---------+-----------+----------+--------------+ SFJ      Full                                                        +---------+---------------+---------+-----------+----------+--------------+ FV Prox  Full                                                        +---------+---------------+---------+-----------+----------+--------------+ FV Mid   Full                                                         +---------+---------------+---------+-----------+----------+--------------+  FV DistalFull                                                        +---------+---------------+---------+-----------+----------+--------------+ PFV      Full                                                        +---------+---------------+---------+-----------+----------+--------------+ POP      Full           Yes      Yes                                 +---------+---------------+---------+-----------+----------+--------------+ PTV      Full                                                        +---------+---------------+---------+-----------+----------+--------------+ PERO     Full                                                        +---------+---------------+---------+-----------+----------+--------------+  Summary: BILATERAL: - No evidence of deep vein thrombosis seen in the lower extremities, bilaterally. -No evidence of popliteal cyst, bilaterally.   *See table(s) above for measurements and observations. Electronically signed by Waverly Ferrari MD on 04/23/2020 at 11:27:04 AM.    Final      Assessment and Plan:   1.  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  Paroxysmal atrial tachycardia -started on low dose BB -it appears as though she is having ST wit frequent PACs but no discernable afib on tele  3.  ASCAD -recent coronary CTA showed dRCA, moderate mLAD with 50-69% stenosis and mid Ramus with 50-69% stenosis as well as non dominant LCx with moderate lesions in OM1.  -FFRct shows multiple vessels with hemodynamically significant flow limiting stenosis. -cardiac cath was recommended but patient had not followed back up with her cardiologist -continue ASA  daily.  Also started on Plavix for CVA -given acute CVA, not  a candidate for cardiac cath at this time -will need to manage medically for now -added low dose BB today  4.  HLD -LDL goal < 70 -LDL was 88 this admit -started on Crestor  daily a few weeks ago but now LFTs elevated -hold statin for now  5.  HTN -BP controlled on exam today -continue Irbesartan  daily and Toprol XL 12.5mg  daily  6.  Ischemic DCM -EF 40-45% by recent echo -ultimately needs cardiac cath and possible revascularization but given acute CVA need to wait on cath -BP very high so a lot of room  to titrate HF meds -stop ARB and start Entresto 24-26mg  BID -increase Toprol to 25mg  daily -add spiro 12.5mg  daily  For questions or updates, please contact [facility] Please consult [email or web address] for contact info under    Signed, , MD  04/24/2020 7:58 AM

## 2020-04-23 NOTE — Progress Notes (Signed)
PT Cancellation Note  Patient Details Name: Madison Hurst MRN: [id number] DOB: 02-Dec-1960   Cancelled Treatment:    Reason Eval/Treat Not Completed: Other (comment).  Pt was lethargic when PT arrived, and will reattempt when pt can allow.   Ivar Drape 04/23/2020, 4:41 PM   Samul Dada, PT MS Acute Rehab Dept. Number: [facility] [id number] and [facility] [phone]

## 2020-04-23 NOTE — Progress Notes (Signed)
PROGRESS NOTE    Madison Hurst  CBJ:[id number] DOB: 04/15/1960 DOA: 04/22/2020 PCP: Serita Grammes, MD    Brief Narrative:  Madison Hurst is a 60 year old female with past medical history significant for paroxysmal atrial fibrillation, CAD, essential hypertension, hyperlipidemia, type 2 diabetes mellitus, rheumatoid arthritis, anxiety, depression who presented to the ED with progressive weakness on the left side with rapid progression over the last week.  Patient reports over the last 2 days, falls on 3 occasions.  Patient states when she fell today, she hit her head slightly.  Patient also with weakness feeling like she is going to pass out, but not as loss of consciousness or syncopal event.  Patient also reports associated headaches that are intermittent with associated sweats; in which these have been present for roughly 1 year.  In the ED, afebrile, HR 125, RR 14, BP 125/102, SPO2 100% on room air.  Sodium 137, potassium 3.7, chloride 98, CO2 28, glucose 173, BUN 15, creatinine 0.59, AST 65, ALT 62.  CK 87.  WBC 13.2, hemoglobin 14.9, platelets 342.  INR 1.3.  TSH 0.740.  SARS-CoV-2 negative.  EtOH level less than 10.  Urinalysis with moderate leukocytes, negative nitrite, rare bacteria, 11-20 WBCs.  UDS positive for opiates.  CT head without contrast with no acute intracranial abnormality.  MRI head positive for acute infarction consistent with posterior circulation embolic disease without signs of hemorrhage.  Patient was given 1 L NS bolus, 5 mg of metoprolol IV and 3 and 24 mg of aspirin.  Neurology was consulted.  Hospitalist service consulted for further evaluation and management of acute CVA.   Assessment & Plan:   Principal Problem:   CVA (cerebral vascular accident) ([place]) Active Problems:   Anxiety   Hypothyroidism   Seropositive rheumatoid arthritis ([place])   Type 2 diabetes mellitus (H[facility])   PAT (paroxysmal atrial tachycardia) (HCC)    Leukocytosis   Essential hypertension   CVA, acute; concerning for embolic etiology Patient presenting to the ED following several falls with associated left-sided weakness progressing over the last week.  MR brain notable for acute infarct consistent with posterior circulation embolic disease.  CTA head/neck with no large vessel occlusion.  Total cholesterol 159, HDL 49, LDL 88.  ESR 3.  TSH 0.740.  Hemoglobin A1c 6.1. --Neurology following, appreciate assistance --MR C-spine: Pending --TTE pending --Plan TEE and loop recorder placement --Aspirin 81 mg p.o. daily and Plavix 75 mg p.o. daily for 3 months followed by aspirin alone --Pending PT/OT/SLP evaluation --Continue monitor on telemetry  Paroxysmal atrial tachycardia/sinus tachycardia On admission, patient notable with elevated heart rates in the 120s.  Patient also reports she had an abnormal coronary CT in which she is following with cardiology, Dr. Harriet Masson outpatient. --Cardiology consulted --Metoprolol succinate 12.5 mg p.o. daily --Continue to monitor on telemetry  Transaminitis: Acute.   --Acute hepatitis panel negative --Alkaline phosphatase 162 --AST 65>203 --ALT 62>121 --hold home statin --Trend LFTs daily  Essential hypertension Home regimen includes metoprolol 12.5 mg daily and telmisartan 20 mg daily. --Metoprolol succinate 12.5 mg p.o. daily --Irbesartan 75 mg p.o. daily (hospital substitution for telmisartan)  Rheumatoid arthritis:  Patient with swelling of the bilateral hands that she states is chronic due to rheumatoid arthritis.  Home medication regimen includes prednisone 5 mg daily, Orencia, flurbiprofen 100 mg twice daily. --Continue prednisone  --Holding home flurbiprofen due to risk of thrombotic event  Diabetes mellitus type 2:  On admission glucose elevated up to 173.  Home medications  include Metformin 500 mg daily.  Hemoglobin A1c 6.1, well controlled. --Hold Metformin --Sensitive SSI for  coverage --CBGs qAC/HS  Anxiety and depression --Continue home Wellbutrin 150 mg daily and Cymbalta 60 mg daily  Hyperlipidemia:  Lipid panel with total cholesterol 159, HDL 49, LDL 88. --Holding home Crestor due to elevated LFTs --Monitor LFTs daily  Hypothyroidism:  TSH on admission was 0.74.   --Continue home levothyroxine 75 mcg daily.    DVT prophylaxis: Lovenox   Code Status: Full Code Family Communication: Updated patient extensively at bedside  Disposition Plan:  Level of care: Telemetry Medical Status is: Inpatient  Remains inpatient appropriate because:Ongoing diagnostic testing needed not appropriate for outpatient work up, Unsafe d/c plan, IV treatments appropriate due to intensity of illness or inability to take PO and Inpatient level of care appropriate due to severity of illness   Dispo: The patient is from: Home              Anticipated d/c is to: To be determined pending therapy evaluation              Anticipated d/c date is: 2 days              Patient currently is not medically stable to d/c.   Difficult to place patient No   Consultants:   Cardiology  Neurology  Procedures:   Vascular duplex ultrasound bilateral lower extremities  TTE  Antimicrobials:   None   Subjective: Patient seen and examined bedside, lying comfortably on bed.  Continues in ED holding area.  Awaiting MR C-spine this morning.  Requesting anxiety medicine prior to MRI.  Continues with left upper and lower extremity weakness.  No other complaints or concerns at this time.  Denies headache, no chest pain, no palpitations, no shortness of breath, no abdominal pain, no fever/chills/night sweats, no dysuria, no cough/congestion, no nausea/vomiting/diarrhea.  No acute events overnight per nursing staff.  Objective: Vitals:   04/23/20 1238 04/23/20 1240 04/23/20 1300 04/23/20 1330  BP: (!) 134/107  (!) 115/102 (!) 141/100  Pulse: (!) 104 (!) 104 (!) 53 (!) 105  Resp:  19 18 (!) 23 20  Temp:      TempSrc:      SpO2: 97% 96% 90% 99%  Weight:       No intake or output data in the 24 hours ending 04/23/20 1347 Filed Weights   04/22/20 1258  Weight: 55.8 kg    Examination:  General exam: Appears calm and comfortable  Respiratory system: Clear to auscultation. Respiratory effort normal.  Oxygenating well on room air Cardiovascular system: S1 & S2 heard, RRR. No JVD, murmurs, rubs, gallops or clicks. No pedal edema. Gastrointestinal system: Abdomen is nondistended, soft and nontender. No organomegaly or masses felt. Normal bowel sounds heard. Central nervous system: Alert and oriented. No focal neurological deficits. Extremities: Muscle strength decreased LUE 4/5 and LLE 4-/5 Skin: No rashes, lesions or ulcers Psychiatry: Judgement and insight appear normal. Mood & affect appropriate.     Data Reviewed: I have personally reviewed following labs and imaging studies  CBC: Recent Labs  Lab 04/22/20 1342 04/23/20 0337  WBC 13.2* 13.0*  NEUTROABS 10.6*  --   HGB 14.9 14.5  HCT 49.0* 47.7*  MCV 100.8* 102.4*  PLT 342 811   Basic Metabolic Panel: Recent Labs  Lab 04/22/20 1342 04/23/20 0337  NA 137 136  K 3.7 3.5  CL 98 99  CO2 28 24  GLUCOSE 173* 127*  BUN 15  13  CREATININE 0.59 0.64  CALCIUM 8.6* 8.0*   GFR: Estimated Creatinine Clearance: 60.8 mL/min (by C-G formula based on SCr of 0.64 mg/dL). Liver Function Tests: Recent Labs  Lab 04/22/20 1342 04/23/20 0337  AST 65* 203*  ALT 62* 121*  ALKPHOS 162* 218*  BILITOT 0.9 1.1  PROT 5.3* 5.3*  ALBUMIN 2.7* 2.7*   No results for input(s): LIPASE, AMYLASE in the last 168 hours. No results for input(s): AMMONIA in the last 168 hours. Coagulation Profile: Recent Labs  Lab 04/22/20 1342  INR 1.3*   Cardiac Enzymes: Recent Labs  Lab 04/22/20 1342  CKTOTAL 87   BNP (last 3 results) No results for input(s): PROBNP in the last 8760 hours. HbA1C: Recent Labs     04/22/20 1838  HGBA1C 6.1*   CBG: Recent Labs  Lab 04/22/20 2158 04/23/20 0730 04/23/20 1216  GLUCAP 144* 134* 216*   Lipid Profile: Recent Labs    04/22/20 1838  CHOL 159  HDL 49  LDLCALC 88  TRIG 111  CHOLHDL 3.2   Thyroid Function Tests: Recent Labs    04/22/20 1453  TSH 0.740   Anemia Panel: Recent Labs    04/23/20 0337  VITAMINB12 659  FOLATE 50.7   Sepsis Labs: No results for input(s): PROCALCITON, LATICACIDVEN in the last 168 hours.  Recent Results (from the past 240 hour(s))  SARS Coronavirus 2 by RT PCR (hospital order, performed in [facility] hospital lab) Nasopharyngeal Nasopharyngeal Swab     Status: None   Collection Time: 04/22/20  1:42 PM   Specimen: Nasopharyngeal Swab  Result Value Ref Range Status   SARS Coronavirus 2 NEGATIVE NEGATIVE Final    Comment: (NOTE) SARS-CoV-2 target nucleic acids are NOT DETECTED.  The SARS-CoV-2 RNA is generally detectable in upper and lower respiratory specimens during the acute phase of infection. The lowest concentration of SARS-CoV-2 viral copies this assay can detect is 250 copies / mL. A negative result does not preclude SARS-CoV-2 infection and should not be used as the sole basis for treatment or other patient management decisions.  A negative result may occur with improper specimen collection / handling, submission of specimen other than nasopharyngeal swab, presence of viral mutation(s) within the areas targeted by this assay, and inadequate number of viral copies (<250 copies / mL). A negative result must be combined with clinical observations, patient history, and epidemiological information.  Fact Sheet for Patients:   StrictlyIdeas.no  Fact Sheet for Healthcare Providers: BankingDealers.co.za  This test is not yet approved or  cleared by the [place] FDA and has been authorized for detection and/or diagnosis of SARS-CoV-2 by FDA under an  Emergency Use Authorization (EUA).  This EUA will remain in effect (meaning this test can be used) for the duration of the COVID-19 declaration under Section 564(b)(1) of the Act, 21 U.S.C. section 360bbb-3(b)(1), unless the authorization is terminated or revoked sooner.  Performed at [facility], [place] [address], [place], [place] [address]          Radiology Studies: CT Code Stroke CTA Head W/WO contrast  Result Date: 04/22/2020 CLINICAL DATA:  Worsening left-sided weakness for 2 weeks. Recent TIA. Fall today. Slurred speech. EXAM: CT ANGIOGRAPHY HEAD AND NECK TECHNIQUE: Multidetector CT imaging of the head and neck was performed using the standard protocol during bolus administration of intravenous contrast. Multiplanar CT image reconstructions and MIPs were obtained to evaluate the vascular anatomy. Carotid stenosis measurements (when applicable) are obtained utilizing NASCET criteria, using  the distal internal carotid diameter as the denominator. CONTRAST:  32m OMNIPAQUE IOHEXOL 350 MG/ML SOLN COMPARISON:  Brain MRI 04/22/2020 Head CT 04/22/2020 FINDINGS: CTA NECK FINDINGS SKELETON: There is no bony spinal canal stenosis. No lytic or blastic lesion. OTHER NECK: Normal pharynx, larynx and major salivary glands. No cervical lymphadenopathy. Enlarged thyroid gland. UPPER CHEST: Small left pleural effusion with fluid in the fissure. AORTIC ARCH: There is no calcific atherosclerosis of the aortic arch. There is no aneurysm, dissection or hemodynamically significant stenosis of the visualized portion of the aorta. Conventional 3 vessel aortic branching pattern. The visualized proximal subclavian arteries are widely patent. RIGHT CAROTID SYSTEM: Normal without aneurysm, dissection or stenosis. LEFT CAROTID SYSTEM: No dissection, occlusion or aneurysm. Mild atherosclerotic calcification at the carotid bifurcation without hemodynamically significant stenosis. VERTEBRAL ARTERIES: Codominant  configuration. Both origins are clearly patent. There is no dissection, occlusion or flow-limiting stenosis to the skull base (V1-V3 segments). CTA HEAD FINDINGS POSTERIOR CIRCULATION: --Vertebral arteries: Normal V4 segments. --Inferior cerebellar arteries: Normal. --Basilar artery: Normal. --Superior cerebellar arteries: Normal. --Posterior cerebral arteries (PCA): Severe stenosis of the right PCA P3 segment over a length 7 mm (12:17). The distal right PCA is normal. Mild stenosis of the left P3 segment in a similar location (12:24). ANTERIOR CIRCULATION: --Intracranial internal carotid arteries: Normal. --Anterior cerebral arteries (ACA): Normal. Hypoplastic right A1 segment, normal variant. --Middle cerebral arteries (MCA): Normal. VENOUS SINUSES: As permitted by contrast timing, patent. Incidentally noted gas in the cavernous sinuses related to contrast injection. ANATOMIC VARIANTS: None Review of the MIP images confirms the above findings. IMPRESSION: 1. No emergent large vessel occlusion. 2. 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 3. Mild stenosis of the left PCA P3 segment. 4. Small left pleural effusion with fluid in the fissure. 5. Enlarged thyroid gland. This has been evaluated on previous imaging. (ref: J Am Coll Radiol. 2015 Feb;12(2): 143-50). Electronically Signed   By: KUlyses JarredM.D.   On: 04/22/2020 20:14   CT Head Wo Contrast  Result Date: 04/22/2020 CLINICAL DATA:  Tube deficit. Progressive weakness, greater on the left. Fell today. EXAM: CT HEAD WITHOUT CONTRAST TECHNIQUE: Contiguous axial images were obtained from the base of the skull through the vertex without intravenous contrast. COMPARISON:  02/16/2020 FINDINGS: Brain: No evidence of acute infarction, hemorrhage, hydrocephalus, extra-axial collection or mass lesion/mass effect. Subcortical and periventricular white matter hypodensity most likely result of chronic ischemic small vessel disease. Similar  findings can be seen with demyelinating disease of the white matter. Diffuse cortical atrophy again seen. Vascular: No hyperdense vessel or unexpected calcification. Skull: Normal. Negative for fracture or focal lesion. Sinuses/Orbits: No acute finding. Other: None. IMPRESSION: No acute intracranial abnormality. Electronically Signed   By: FMiachel RouxM.D.   On: 04/22/2020 14:29   CT Code Stroke CTA Neck W/WO contrast  Result Date: 04/22/2020 CLINICAL DATA:  Worsening left-sided weakness for 2 weeks. Recent TIA. Fall today. Slurred speech. EXAM: CT ANGIOGRAPHY HEAD AND NECK TECHNIQUE: Multidetector CT imaging of the head and neck was performed using the standard protocol during bolus administration of intravenous contrast. Multiplanar CT image reconstructions and MIPs were obtained to evaluate the vascular anatomy. Carotid stenosis measurements (when applicable) are obtained utilizing NASCET criteria, using the distal internal carotid diameter as the denominator. CONTRAST:  773mOMNIPAQUE IOHEXOL 350 MG/ML SOLN COMPARISON:  Brain MRI 04/22/2020 Head CT 04/22/2020 FINDINGS: CTA NECK FINDINGS SKELETON: There is no bony spinal canal stenosis. No lytic or  blastic lesion. OTHER NECK: Normal pharynx, larynx and major salivary glands. No cervical lymphadenopathy. Enlarged thyroid gland. UPPER CHEST: Small left pleural effusion with fluid in the fissure. AORTIC ARCH: There is no calcific atherosclerosis of the aortic arch. There is no aneurysm, dissection or hemodynamically significant stenosis of the visualized portion of the aorta. Conventional 3 vessel aortic branching pattern. The visualized proximal subclavian arteries are widely patent. RIGHT CAROTID SYSTEM: Normal without aneurysm, dissection or stenosis. LEFT CAROTID SYSTEM: No dissection, occlusion or aneurysm. Mild atherosclerotic calcification at the carotid bifurcation without hemodynamically significant stenosis. VERTEBRAL ARTERIES: Codominant  configuration. Both origins are clearly patent. There is no dissection, occlusion or flow-limiting stenosis to the skull base (V1-V3 segments). CTA HEAD FINDINGS POSTERIOR CIRCULATION: --Vertebral arteries: Normal V4 segments. --Inferior cerebellar arteries: Normal. --Basilar artery: Normal. --Superior cerebellar arteries: Normal. --Posterior cerebral arteries (PCA): Severe stenosis of the right PCA P3 segment over a length 7 mm (12:17). The distal right PCA is normal. Mild stenosis of the left P3 segment in a similar location (12:24). ANTERIOR CIRCULATION: --Intracranial internal carotid arteries: Normal. --Anterior cerebral arteries (ACA): Normal. Hypoplastic right A1 segment, normal variant. --Middle cerebral arteries (MCA): Normal. VENOUS SINUSES: As permitted by contrast timing, patent. Incidentally noted gas in the cavernous sinuses related to contrast injection. ANATOMIC VARIANTS: None Review of the MIP images confirms the above findings. IMPRESSION: 1. No emergent large vessel occlusion. 2. 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 3. Mild stenosis of the left PCA P3 segment. 4. Small left pleural effusion with fluid in the fissure. 5. Enlarged thyroid gland. This has been evaluated on previous imaging. (ref: J Am Coll Radiol. 2015 Feb;12(2): 143-50). Electronically Signed   By: Ulyses Jarred M.D.   On: 04/22/2020 20:14   MR BRAIN WO CONTRAST  Result Date: 04/22/2020 CLINICAL DATA:  Acute neurological deficit. Progressive weakness, worse on the left. EXAM: MRI HEAD WITHOUT CONTRAST TECHNIQUE: Multiplanar, multiecho pulse sequences of the brain and surrounding structures were obtained without intravenous contrast. COMPARISON:  Head CT same day.  MRI 02/28/2020. FINDINGS: Brain: Diffusion imaging shows acute infarction affecting the right mid-posterior caudate an the superior thalamus. Single separate punctate acute infarction in the cortex of the medial right parietooccipital  junction. Few small punctate acute infarctions in the right occipital cortex. 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 Elsewhere, there is chronic small vessel ischemic change affecting the hemispheric deep and subcortical white matter, right more than left. No focal insult affects the brainstem or cerebellum. No evidence of mass, hemorrhage, hydrocephalus or extra-axial collection Vascular: Major vessels at the base of the brain show flow. Skull and upper cervical spine: Negative Sinuses/Orbits: Clear/normal Other: None IMPRESSION: 1. Acute infarction affecting the right mid-posterior caudate and right superior thalamus. Single separate punctate acute infarction in the cortex of the medial right parietooccipital junction. Few small punctate acute infarctions in the right occipital cortex. 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 No evidence of hemorrhage or mass effect. 2. Chronic small-vessel ischemic changes elsewhere throughout the brain as outlined above. Electronically Signed   By: Nelson Chimes M.D.   On: 04/22/2020 17:12   MR CERVICAL SPINE WO CONTRAST  Result Date: 04/23/2020 CLINICAL DATA:  Myelopathy, acute or progressive. Additional history provided: Patient reports worsening weakness on the left side, most notably within the last week, 3 falls in the last 2 days weakness all over, but the left-sided seems weaker than the right. EXAM: MRI CERVICAL SPINE WITHOUT CONTRAST  TECHNIQUE: Multiplanar, multisequence MR imaging of the cervical spine was performed. No intravenous contrast was administered. COMPARISON:  CT head/neck 04/22/2020.  CT cervical spine 01/26/2015. FINDINGS: All sequences are moderate to severely motion degraded, significantly limiting evaluation. Alignment: Straightening of the expected cervical lordosis. No significant spondylolisthesis. Vertebrae: No significant marrow edema or focal suspicious osseous [place] is identified. Cord: Motion  degradation precludes adequate evaluation for spinal cord signal abnormality. Posterior Fossa, vertebral arteries, paraspinal tissues: Cerebellar atrophy. No acute abnormality identified within included portions of the posterior fossa. Cervical vertebral artery flow voids poorly assessed due to motion degradation. No paraspinal soft tissue abnormality is identified. Disc levels: Multilevel disc degeneration. Most notably, there is moderate disc degeneration at the C4-C5, C5-C6 and C6-C7 levels. C2-C3: Disc bulge. Facet hypertrophy. No significant spinal canal stenosis. Mild bilateral neural foraminal narrowing. C3-C4: Disc bulge. Bilateral disc osteophyte ridge/uncinate hypertrophy (greater on the right). Facet and ligamentum flavum hypertrophy. Apparent moderate spinal canal stenosis. Bilateral neural foraminal narrowing (severe right, mild left). C4-C5: Posterior disc osteophyte complex. Uncovertebral, facet and ligamentum flavum hypertrophy. Apparent moderate/severe spinal canal stenosis. Bilateral neural foraminal narrowing (moderate/severe right, moderate left). C5-C6: Posterior disc osteophyte complex. Uncovertebral, facet and ligamentum flavum hypertrophy. Apparent moderate/severe spinal canal stenosis. Bilateral neural foraminal narrowing (moderate right, moderate/severe left). C6-C7: Posterior disc osteophyte complex. Uncovertebral, facet and ligamentum flavum hypertrophy. Apparent moderate spinal canal stenosis. Bilateral neural foraminal narrowing (moderate/severe right, moderate left). C7-T1: Mild endplate spurring on the left. Mild facet hypertrophy (greater on the left). No appreciable disc herniation or spinal canal stenosis. Mild left neural foraminal narrowing. IMPRESSION: All acquired sequences are moderate to severely motion degraded, significantly limiting evaluation. A repeat examination should be considered when the patient is better able to tolerate the study. Cervical spondylosis as outlined.  Most notably, there is apparent moderate/severe spinal canal stenosis at C4-C5 and C5-C6, and apparent moderate spinal canal stenosis at C3-C4 and C6-C7. Motion degradation limits evaluation for subtle spinal cord mass effect and precludes adequate evaluation for spinal cord signal abnormality. Multilevel neural foraminal narrowing as detailed and greatest on the right at C3-C4 (severe), on the right at C4-C5 (moderate/severe), on the left at C5-C6 (moderate/severe) and on the right at C6-C7 (moderate/severe). Electronically Signed   By: Kellie Simmering DO   On: 04/23/2020 12:27   DG Chest Port 1 View  Result Date: 04/22/2020 CLINICAL DATA:  Weakness EXAM: PORTABLE CHEST 1 VIEW COMPARISON:  Portable exam 1345 hours compared to 12/21/2012 FINDINGS: Upper normal heart size post median sternotomy. Mediastinal contours and pulmonary vascularity normal. Subsegmental atelectasis versus scarring at LEFT base. Remaining lungs clear. No acute infiltrate, pleural effusion, or pneumothorax. Bones demineralized with note of a LEFT shoulder prosthesis and advanced RIGHT glenohumeral degenerative changes. IMPRESSION: Atelectasis versus scarring at LEFT base. Electronically Signed   By: Lavonia Dana M.D.   On: 04/22/2020 14:01   VAS [place] LOWER EXTREMITY VENOUS (DVT)  Result Date: 04/23/2020  Lower Venous DVT Study Indications: SOB.  Risk Factors: None identified. Anticoagulation: Lovenox. Limitations: Patient movement. Comparison Study: No previous exams Performing Technologist: Vonzell Schlatter RVT  Examination Guidelines: A complete evaluation includes B-mode imaging, spectral Doppler, color Doppler, and power Doppler as needed of all accessible portions of each vessel. Bilateral testing is considered an integral part of a complete examination. Limited examinations for reoccurring indications may be performed as noted. The reflux portion of the exam is performed with the patient in reverse Trendelenburg.   +---------+---------------+---------+-----------+----------+--------------+ RIGHT    CompressibilityPhasicitySpontaneityPropertiesThrombus Aging +---------+---------------+---------+-----------+----------+--------------+  CFV      Full           Yes      Yes                                 +---------+---------------+---------+-----------+----------+--------------+ SFJ      Full                                                        +---------+---------------+---------+-----------+----------+--------------+ FV Prox  Full                                                        +---------+---------------+---------+-----------+----------+--------------+ FV Mid   Full                                                        +---------+---------------+---------+-----------+----------+--------------+ FV DistalFull                                                        +---------+---------------+---------+-----------+----------+--------------+ PFV      Full                                                        +---------+---------------+---------+-----------+----------+--------------+ POP      Full           Yes      Yes                                 +---------+---------------+---------+-----------+----------+--------------+ PTV      Full                                                        +---------+---------------+---------+-----------+----------+--------------+ PERO     Full                                                        +---------+---------------+---------+-----------+----------+--------------+   +---------+---------------+---------+-----------+----------+--------------+ LEFT     CompressibilityPhasicitySpontaneityPropertiesThrombus Aging +---------+---------------+---------+-----------+----------+--------------+ CFV      Full           Yes      Yes                                  +---------+---------------+---------+-----------+----------+--------------+  SFJ      Full                                                        +---------+---------------+---------+-----------+----------+--------------+ FV Prox  Full                                                        +---------+---------------+---------+-----------+----------+--------------+ FV Mid   Full                                                        +---------+---------------+---------+-----------+----------+--------------+ FV DistalFull                                                        +---------+---------------+---------+-----------+----------+--------------+ PFV      Full                                                        +---------+---------------+---------+-----------+----------+--------------+ POP      Full           Yes      Yes                                 +---------+---------------+---------+-----------+----------+--------------+ PTV      Full                                                        +---------+---------------+---------+-----------+----------+--------------+ PERO     Full                                                        +---------+---------------+---------+-----------+----------+--------------+  Summary: BILATERAL: - No evidence of deep vein thrombosis seen in the lower extremities, bilaterally. -No evidence of popliteal cyst, bilaterally.   *See table(s) above for measurements and observations. Electronically signed by Deitra Mayo MD on 04/23/2020 at 11:27:04 AM.    Final         Scheduled Meds: . aspirin EC  81 mg Oral Daily  . buPROPion  150 mg Oral Daily  . clopidogrel  75 mg Oral Daily  . DULoxetine  60 mg Oral Daily  . enoxaparin (LOVENOX) injection  40 mg Subcutaneous Q24H  . folic acid  2 mg Oral Daily  . insulin aspart  0-5  Units Subcutaneous QHS  . insulin aspart  0-9 Units Subcutaneous TID WC  . irbesartan   75 mg Oral Daily  . levothyroxine  75 mcg Oral QAC breakfast  . metoprolol succinate  12.5 mg Oral Daily  . predniSONE  5 mg Oral Daily  . rosuvastatin  20 mg Oral Daily   Continuous Infusions:   LOS: 1 day    Time spent: 38 minutes spent on chart review, discussion with nursing staff, consultants, updating family and interview/physical exam; more than 50% of that time was spent in counseling and/or coordination of care.    Shadrick Senne J [place] DO [facility] Available via Epic secure chat 7am-7pm After these hours, please refer to coverage provider listed on [email or web address] 04/23/2020, 1:47 PM

## 2020-04-23 NOTE — Progress Notes (Signed)
Bilateral lower extremity venous study completed.      Please see CV Proc for preliminary results.   Oswell Say, RVT  

## 2020-04-23 NOTE — Progress Notes (Signed)
Bilateral lower extremity venous study completed.      Please see CV Proc for preliminary results.   [place] Erck, RVT  

## 2020-04-23 NOTE — ED Notes (Signed)
MD paged for patient .Marland Kitchen MD [place] at bedside speaking with patient and husband

## 2020-04-23 NOTE — ED Notes (Signed)
Report called and given to inpatient RN 

## 2020-04-23 NOTE — Progress Notes (Signed)
  Echocardiogram 2D Echocardiogram has been performed.  Yaron Grasse G Melainie Krinsky 04/23/2020, 2:09 PM

## 2020-04-23 NOTE — Progress Notes (Signed)
    [facility] has been requested to perform a transesophageal echocardiogram on 01/26 for CVA.  After careful review of history and examination, the risks and benefits of transesophageal echocardiogram have been explained including risks of esophageal damage, perforation (1:10,000 risk), bleeding, pharyngeal hematoma as well as other potential complications associated with conscious sedation including aspiration, arrhythmia, respiratory failure and death. Alternatives to treatment were discussed, questions were answered. Patient is willing to proceed, but wishes to speak with her husband. Tried work and home numbers, no answer. She will continue to try to reach him.  She is agreeable to being on the schedule for 7:30 tomorrow, she will get nursing staff to contact us if she feels differently after speaking with her husband.  Theodore Demark, PA-C 04/23/2020 4:11 PM

## 2020-04-23 NOTE — Anesthesia Preprocedure Evaluation (Addendum)
Anesthesia Evaluation  Patient identified by MRN, date of birth, ID band Patient awake    Reviewed: Allergy & Precautions, NPO status , Patient's Chart, lab work & pertinent test results  Airway Mallampati: II  TM Distance: >3 FB Neck ROM: Full    Dental  (+) Teeth Intact, Dental Advisory Given   Pulmonary former smoker,    Pulmonary exam normal breath sounds clear to auscultation       Cardiovascular hypertension, Pt. on home beta blockers and Pt. on medications + DOE  Normal cardiovascular exam Rhythm:Regular Rate:Normal  Echo 04/23/20: 1. Left ventricular ejection fraction, by estimation, is 20 to 25%. The  left ventricle has severely decreased function. The left ventricle  demonstrates global hypokinesis. Left ventricular diastolic parameters are  indeterminate.  2. Right ventricular systolic function is normal. The right ventricular  size is normal.  3. Left atrial size was moderately dilated.  4. The mitral valve is normal in structure. Mild mitral valve  regurgitation. No evidence of mitral stenosis.  5. The aortic valve is tricuspid. Aortic valve regurgitation is not  visualized. No aortic stenosis is present.  6. The inferior vena cava is normal in size with greater than 50%  respiratory variability, suggesting right atrial pressure of 3 mmHg.    Neuro/Psych PSYCHIATRIC DISORDERS Anxiety Depression CVA    GI/Hepatic negative GI ROS, Neg liver ROS,   Endo/Other  diabetes, Oral Hypoglycemic AgentsHypothyroidism   Renal/GU negative Renal ROS     Musculoskeletal  (+) Arthritis , Rheumatoid disorders,    Abdominal   Peds  Hematology negative hematology ROS (+)   Anesthesia Other Findings   Reproductive/Obstetrics                           Anesthesia Physical Anesthesia Plan  ASA: IV  Anesthesia Plan: MAC   Post-op Pain Management:    Induction: Intravenous  PONV Risk  Score and Plan: 2 and Propofol infusion and Treatment may vary due to age or medical condition  Airway Management Planned: Natural Airway and Nasal Cannula  Additional Equipment:   Intra-op Plan:   Post-operative Plan:   Informed Consent: I have reviewed the patients History and Physical, chart, labs and discussed the procedure including the risks, benefits and alternatives for the proposed anesthesia with the patient or authorized representative who has indicated his/her understanding and acceptance.       Plan Discussed with:   Anesthesia Plan Comments:       Anesthesia Quick Evaluation

## 2020-04-23 NOTE — ED Notes (Signed)
Patient transported to MRI 

## 2020-04-23 NOTE — H&P (View-Only) (Signed)
STROKE TEAM PROGRESS NOTE   INTERVAL HISTORY No acute events since arrival.  Per ED team cervical spine MRI added to work up to rule out myelopathy due to generalized weakness.   Patient is drowsy and requires stimulation for ROS. Currently denies pain and specifically neck pain or radiating arm pain, back pain or leg pain as well as chest pain. She also denies numbness or tingling in face arms or legs.  She states she understands she has a had stroke. She agrees to TEE to continue stroke work up and to explore possibility of needing LOOP recorder placement. Our conversation was limited by her drowsiness and one word responses.   Vitals:   04/23/20 0200 04/23/20 0309 04/23/20 0532 04/23/20 0730  BP: 119/77 (!) 138/125 (!) 127/108 111/87  Pulse: 93 (!) 59 69 (!) 103  Resp: 20 (!) 21 (!) 23 20  Temp:    97.9 F (36.6 C)  TempSrc:    Oral  SpO2: 95% 99% 100% 97%  Weight:       CBC:  Recent Labs  Lab 04/22/20 1342 04/23/20 0337  WBC 13.2* 13.0*  NEUTROABS 10.6*  --   HGB 14.9 14.5  HCT 49.0* 47.7*  MCV 100.8* 102.4*  PLT 342 962   Basic Metabolic Panel:  Recent Labs  Lab 04/22/20 1342 04/23/20 0337  NA 137 136  K 3.7 3.5  CL 98 99  CO2 28 24  GLUCOSE 173* 127*  BUN 15 13  CREATININE 0.59 0.64  CALCIUM 8.6* 8.0*   Lipid Panel:  Recent Labs  Lab 04/22/20 1838  CHOL 159  TRIG 111  HDL 49  CHOLHDL 3.2  VLDL 22  LDLCALC 88   HgbA1c:  Recent Labs  Lab 04/22/20 1838  HGBA1C 6.1*   Urine Drug Screen:  Recent Labs  Lab 04/23/20 0139  LABOPIA POSITIVE*  COCAINSCRNUR NONE DETECTED  LABBENZ NONE DETECTED  AMPHETMU NONE DETECTED  THCU NONE DETECTED  LABBARB NONE DETECTED    Alcohol Level  Recent Labs  Lab 04/22/20 1342  ETH <10    IMAGING past 24 hours CT Code Stroke CTA Head W/WO contrast  Result Date: 04/22/2020 CLINICAL DATA:  Worsening left-sided weakness for 2 weeks. Recent TIA. Fall today. Slurred speech. EXAM: CT ANGIOGRAPHY HEAD AND NECK  TECHNIQUE: Multidetector CT imaging of the head and neck was performed using the standard protocol during bolus administration of intravenous contrast. Multiplanar CT image reconstructions and MIPs were obtained to evaluate the vascular anatomy. Carotid stenosis measurements (when applicable) are obtained utilizing NASCET criteria, using the distal internal carotid diameter as the denominator. CONTRAST:  13m OMNIPAQUE IOHEXOL 350 MG/ML SOLN COMPARISON:  Brain MRI 04/22/2020 Head CT 04/22/2020 FINDINGS: CTA NECK FINDINGS SKELETON: There is no bony spinal canal stenosis. No lytic or blastic lesion. OTHER NECK: Normal pharynx, larynx and major salivary glands. No cervical lymphadenopathy. Enlarged thyroid gland. UPPER CHEST: Small left pleural effusion with fluid in the fissure. AORTIC ARCH: There is no calcific atherosclerosis of the aortic arch. There is no aneurysm, dissection or hemodynamically significant stenosis of the visualized portion of the aorta. Conventional 3 vessel aortic branching pattern. The visualized proximal subclavian arteries are widely patent. RIGHT CAROTID SYSTEM: Normal without aneurysm, dissection or stenosis. LEFT CAROTID SYSTEM: No dissection, occlusion or aneurysm. Mild atherosclerotic calcification at the carotid bifurcation without hemodynamically significant stenosis. VERTEBRAL ARTERIES: Codominant configuration. Both origins are clearly patent. There is no dissection, occlusion or flow-limiting stenosis to the skull base (V1-V3 segments). CTA HEAD FINDINGS  POSTERIOR CIRCULATION: --Vertebral arteries: Normal V4 segments. --Inferior cerebellar arteries: Normal. --Basilar artery: Normal. --Superior cerebellar arteries: Normal. --Posterior cerebral arteries (PCA): Severe stenosis of the right PCA P3 segment over a length 7 mm (12:17). The distal right PCA is normal. Mild stenosis of the left P3 segment in a similar location (12:24). ANTERIOR CIRCULATION: --Intracranial internal carotid  arteries: Normal. --Anterior cerebral arteries (ACA): Normal. Hypoplastic right A1 segment, normal variant. --Middle cerebral arteries (MCA): Normal. VENOUS SINUSES: As permitted by contrast timing, patent. Incidentally noted gas in the cavernous sinuses related to contrast injection. ANATOMIC VARIANTS: None Review of the MIP images confirms the above findings. IMPRESSION: 1. No emergent large vessel occlusion. 2. 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 3. Mild stenosis of the left PCA P3 segment. 4. Small left pleural effusion with fluid in the fissure. 5. Enlarged thyroid gland. This has been evaluated on previous imaging. (ref: J Am Coll Radiol. 2015 Feb;12(2): 143-50). Electronically Signed   By: Ulyses Jarred M.D.   On: 04/22/2020 20:14   CT Head Wo Contrast  Result Date: 04/22/2020 CLINICAL DATA:  Tube deficit. Progressive weakness, greater on the left. Fell today. EXAM: CT HEAD WITHOUT CONTRAST TECHNIQUE: Contiguous axial images were obtained from the base of the skull through the vertex without intravenous contrast. COMPARISON:  02/16/2020 FINDINGS: Brain: No evidence of acute infarction, hemorrhage, hydrocephalus, extra-axial collection or mass lesion/mass effect. Subcortical and periventricular white matter hypodensity most likely result of chronic ischemic small vessel disease. Similar findings can be seen with demyelinating disease of the white matter. Diffuse cortical atrophy again seen. Vascular: No hyperdense vessel or unexpected calcification. Skull: Normal. Negative for fracture or focal lesion. Sinuses/Orbits: No acute finding. Other: None. IMPRESSION: No acute intracranial abnormality. Electronically Signed   By: Miachel Roux M.D.   On: 04/22/2020 14:29   CT Code Stroke CTA Neck W/WO contrast  Result Date: 04/22/2020 CLINICAL DATA:  Worsening left-sided weakness for 2 weeks. Recent TIA. Fall today. Slurred speech. EXAM: CT ANGIOGRAPHY HEAD AND NECK TECHNIQUE:  Multidetector CT imaging of the head and neck was performed using the standard protocol during bolus administration of intravenous contrast. Multiplanar CT image reconstructions and MIPs were obtained to evaluate the vascular anatomy. Carotid stenosis measurements (when applicable) are obtained utilizing NASCET criteria, using the distal internal carotid diameter as the denominator. CONTRAST:  44m OMNIPAQUE IOHEXOL 350 MG/ML SOLN COMPARISON:  Brain MRI 04/22/2020 Head CT 04/22/2020 FINDINGS: CTA NECK FINDINGS SKELETON: There is no bony spinal canal stenosis. No lytic or blastic lesion. OTHER NECK: Normal pharynx, larynx and major salivary glands. No cervical lymphadenopathy. Enlarged thyroid gland. UPPER CHEST: Small left pleural effusion with fluid in the fissure. AORTIC ARCH: There is no calcific atherosclerosis of the aortic arch. There is no aneurysm, dissection or hemodynamically significant stenosis of the visualized portion of the aorta. Conventional 3 vessel aortic branching pattern. The visualized proximal subclavian arteries are widely patent. RIGHT CAROTID SYSTEM: Normal without aneurysm, dissection or stenosis. LEFT CAROTID SYSTEM: No dissection, occlusion or aneurysm. Mild atherosclerotic calcification at the carotid bifurcation without hemodynamically significant stenosis. VERTEBRAL ARTERIES: Codominant configuration. Both origins are clearly patent. There is no dissection, occlusion or flow-limiting stenosis to the skull base (V1-V3 segments). CTA HEAD FINDINGS POSTERIOR CIRCULATION: --Vertebral arteries: Normal V4 segments. --Inferior cerebellar arteries: Normal. --Basilar artery: Normal. --Superior cerebellar arteries: Normal. --Posterior cerebral arteries (PCA): Severe stenosis of the right PCA P3 segment over a length 7 mm (12:17). The distal right  PCA is normal. Mild stenosis of the left P3 segment in a similar location (12:24). ANTERIOR CIRCULATION: --Intracranial internal carotid arteries:  Normal. --Anterior cerebral arteries (ACA): Normal. Hypoplastic right A1 segment, normal variant. --Middle cerebral arteries (MCA): Normal. VENOUS SINUSES: As permitted by contrast timing, patent. Incidentally noted gas in the cavernous sinuses related to contrast injection. ANATOMIC VARIANTS: None Review of the MIP images confirms the above findings. IMPRESSION: 1. No emergent large vessel occlusion. 2. 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 3. Mild stenosis of the left PCA P3 segment. 4. Small left pleural effusion with fluid in the fissure. 5. Enlarged thyroid gland. This has been evaluated on previous imaging. (ref: J Am Coll Radiol. 2015 Feb;12(2): 143-50). Electronically Signed   By: Ulyses Jarred M.D.   On: 04/22/2020 20:14   MR BRAIN WO CONTRAST  Result Date: 04/22/2020 CLINICAL DATA:  Acute neurological deficit. Progressive weakness, worse on the left. EXAM: MRI HEAD WITHOUT CONTRAST TECHNIQUE: Multiplanar, multiecho pulse sequences of the brain and surrounding structures were obtained without intravenous contrast. COMPARISON:  Head CT same day.  MRI 02/28/2020. FINDINGS: Brain: Diffusion imaging shows acute infarction affecting the right mid-posterior caudate an the superior thalamus. Single separate punctate acute infarction in the cortex of the medial right parietooccipital junction. Few small punctate acute infarctions in the right occipital cortex. 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 Elsewhere, there is chronic small vessel ischemic change affecting the hemispheric deep and subcortical white matter, right more than left. No focal insult affects the brainstem or cerebellum. No evidence of mass, hemorrhage, hydrocephalus or extra-axial collection Vascular: Major vessels at the base of the brain show flow. Skull and upper cervical spine: Negative Sinuses/Orbits: Clear/normal Other: None IMPRESSION: 1. Acute infarction affecting the right  mid-posterior caudate and right superior thalamus. Single separate punctate acute infarction in the cortex of the medial right parietooccipital junction. Few small punctate acute infarctions in the right occipital cortex. 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 No evidence of hemorrhage or mass effect. 2. Chronic small-vessel ischemic changes elsewhere throughout the brain as outlined above. Electronically Signed   By: Nelson Chimes M.D.   On: 04/22/2020 17:12   DG Chest Port 1 View  Result Date: 04/22/2020 CLINICAL DATA:  Weakness EXAM: PORTABLE CHEST 1 VIEW COMPARISON:  Portable exam 1345 hours compared to 12/21/2012 FINDINGS: Upper normal heart size post median sternotomy. Mediastinal contours and pulmonary vascularity normal. Subsegmental atelectasis versus scarring at LEFT base. Remaining lungs clear. No acute infiltrate, pleural effusion, or pneumothorax. Bones demineralized with note of a LEFT shoulder prosthesis and advanced RIGHT glenohumeral degenerative changes. IMPRESSION: Atelectasis versus scarring at LEFT base. Electronically Signed   By: Lavonia Dana M.D.   On: 04/22/2020 14:01    PHYSICAL EXAM  Physical Exam  Constitutional: Well developed middle aged female lying in bed with eyes closed. NAD.  Psych: Affect appropriate to situation Eyes: No scleral injection HENT: No OP obstruction MSK: Arthritic changes of bilateral hands and toes are present Respiratory: No increased work of breathing  Skin: Flushed face. Purplish toes which patient reports is baseline secondary to her arthritis  Neuro: Mental Status: Patient is awake, alert, oriented to person, place, month, year, and situation. She arouses easily with verbal stimuli. Remains drowsy and requires constant verbal stimulation to continue in conversation.  Speech is clear and fluent.   Cranial Nerves: II: Visual Fields are full. Pupils are equal, round, and reactive to light.  III,IV, VI: EOMI  without ptosis or diploplia.  V: Facial sensation is symmetric to temperature VII: Facial movement is symmetric.  VIII: hearing is intact to voice X: Uvula elevates symmetrically XI: Shoulder shrug is symmetric. XII: tongue is midline without atrophy or fasciculations.  Motor: Tone is normal. Bulk is normal.  Right upper extremity 4/5 throughout, left upper extremity 4-/5 throughout, right lower extremity 5/5 throughout, left lower extremity 4/5 hip flexion, otherwise 5/5 throughout Sensory: Sensation is symmetric to light touch and temperature in the arms and legs. Plantars: Toes are downgoing bilaterally.  Cerebellar: FNF and HKS intact bilaterally  Slight left sided facial weakness.  Patient with generalized weakness of her upper and lower extremities, but more so on the left.  No Hoffman's bilat UE. Decreased grip on the left with pronator drift.  Patient is able to hold her right arm and leg up against gravity, but unable to do so for more than a few seconds on the left side.   ASSESSMENT/PLAN Amiria Orrison is a 60 y.o. female with medical history significant of paroxysmal atrial tachycardia, CAD (newly diagnosed) with plan for evaluation by catheterization this month, hypertension, hyperlipidemia, diabetes mellitus type 2, rheumatoid arthritis, anxiety, and depression presents with complaints of worsening weakness on the left side most notably in the last week.    Stroke - right MCA/PCA, PCA, thalamus, right PLIC and CR scattered small/punctate infarcts, concerning for cardio-embolic source  CT head No acute abnormality.Small vessel disease. Atrophy.  CTA head & neck:  No emergent large vessel occlusion. Severe stenosis of the right PCA P3 segment over a length of 38m. Mild stenosis of the left PCA P3 segment.  MRI Brain: Acute infarction affecting the right mid-posterior caudate and right superior thalamus. Single separate punctate acute infarction in the cortex of  the medial right parietooccipital junction. Few small punctate acute infarctions in the right occipital cortex.   LE venous Doppler: No evidence of DVT  2D Echo PENDING  TEE PENDING  Recommend long term cardiac monitoring to rule out afib  LDL 88  HgbA1c 6.1  UDS positive for opioid which is appropriate as she takes Vicodin for RA pain  VTE prophylaxis is recommended  No AC prior to admission, now on ASA 88mand plavix daily DAPT. Plan for ASA 8128mnd Plavix 96m54m3 months then ASA 81mg64motherapy given right PCA high grade stenosis  Therapy recommendations:  TBD  Disposition:  TBD  Recently diagnosed CAD and SVT  Recently started on metoprolol XL 12.5 mg daily for abnormal rhythm, and started on ASA 81mg 13mCrestor as well this month.   Additionally she had an echocardiogram on 03/11/2020 which demonstrated reduced ejection fraction (40 to 45%) with mild concentric LVH and grade 1 diastolic dysfunction as well as mildly dilated left atrium.   recent coronary CT with markedly elevated calcium score and cardiac cath was planned for this month   Zio patch 3-7 days in 02/2020 showed AT with variable block and frequent PACs  EP contacted for consideration of LOOP recorder placement.   Hypertension  Home meds:  Micardis 20mg d49m  Stable on admission . Long-term BP goal normotensive  Hyperlipidemia  Home meds:  20mg Cr86mr started two weeks ago. HOLD due to acute transaminitis.  LDL 88, goal < 70  Continue statin at discharge if appropriate   Diabetes type II Controlled  Home meds:  Metformin (on hold)  HgbA1c 6.1, goal < 7.0  CBGs  SSI sensitive  Acute transaminitis  Alkaline phosphatase 162,   AST/ALT  65/62->203/121   Statin on hold  Management per primary team   Other Stroke Risk Factors  Former cigarette smoker:  reports that she quit smoking about 26 years ago. She has never used smokeless tobacco.   Other Active Problems managed by  primary team:  RA with chronic pain managed by opioids : on Orencia injections weekly and prednisone 47m daily along with Vicodin for pain control. NSAIDS on hold due to bleeding risk. ESR WNL.  Concern for possible cervical myelopathy: Generalized weakness/hyperreflexia and falls prompted evaluation by cervical MRI which is pending (per tech patient could not cooperate with immobility for testing).   Leukocytosis/SIRS: WBCs 13 on admission. Stable today. No clear source at present. Afebrile. UA negative for infection.   Hypothyroidism: TSH 0.74 on admission, home levothyroxine 76 mcg daily continued.   Hospital day # 1  This plan of care was discussed with and directed by Dr. XErlinda Hong  DHetty Blend NP-C  ATTENDING NOTE: I reviewed above note and agree with the assessment and plan. Pt was seen and examined.   60year old female with history of paroxysmal atrial tachycardia, CAD, hypertension, hyperlipidemia, diabetes, rheumatoid arthritis on steroids, anemia admitted for left-sided weakness for 2 weeks with worsening.  CT no acute abnormality.  MRI showed right MCA/PCA, right PCA, right thalamus punctate infarcts.  Also, right internal capsule and CR scattered infarcts.  CTA head and neck right P3 severe stenosis.  LE venous Doppler no DVT.  2D echo pending.  ESR negative.  A1c 6.1, LDL 88.  Patient does have transaminitis and AST/ALT trending up.  WBC 13.2.  On exam, patient awake alert, orientated to place and age, however stated month wrong.  No aphasia, slight dysarthria, able to name and repeat and follow simple commands.  Cranial nerves intact.  Left upper extremity decreased strength with drift but not hitting bed.  Lateral lower extremity equal strengths.  Sensation symmetrical.  Finger-to-nose bilaterally intact although slow on the left.  Patient stroke multifocal, concerning for embolic source.  Given patient history of arrhythmia, concerning for occult A. fib.  EKG x2 showed  AT with irregular rate.  Recommend further cardioembolic work-up with TEE and loop recorder, and EP consult.  Currently on aspirin Plavix DAPT.  Hold off statin due to transaminitis.  PT/OT.  Discussed with Dr. [place]  Will follow.  JRosalin Hawking MD PhD Stroke Neurology 04/23/2020 2:19 PM  I spent  35 minutes in total face-to-face time with the patient, more than 50% of which was spent in counseling and coordination of care, reviewing test results, images and medication, and discussing the diagnosis, treatment plan and potential prognosis. This patient's care requiresreview of multiple databases, neurological assessment, discussion with family, other specialists and medical decision making of high complexity.  To contact Stroke Continuity provider, please refer to [email or web address] After hours, contact General Neurology

## 2020-04-24 ENCOUNTER — Inpatient Hospital Stay (HOSPITAL_COMMUNITY): Payer: Medicare PPO | Admitting: Anesthesiology

## 2020-04-24 ENCOUNTER — Inpatient Hospital Stay (HOSPITAL_COMMUNITY): Payer: Medicare PPO

## 2020-04-24 ENCOUNTER — Encounter (HOSPITAL_COMMUNITY)
Admission: EM | Disposition: A | Discharge status: Rehabilitation Facility | Payer: Self-pay | Source: Home / Self Care | Attending: Internal Medicine

## 2020-04-24 DIAGNOSIS — D72829 Elevated white blood cell count, unspecified: Secondary | ICD-10-CM

## 2020-04-24 DIAGNOSIS — I639 Cerebral infarction, unspecified: Secondary | ICD-10-CM | POA: Diagnosis not present

## 2020-04-24 DIAGNOSIS — I340 Nonrheumatic mitral (valve) insufficiency: Secondary | ICD-10-CM | POA: Diagnosis not present

## 2020-04-24 DIAGNOSIS — Q211 Atrial septal defect: Secondary | ICD-10-CM

## 2020-04-24 HISTORY — PX: BUBBLE STUDY: SHX6837

## 2020-04-24 HISTORY — PX: TEE WITHOUT CARDIOVERSION: SHX5443

## 2020-04-24 LAB — CBC
HCT: 42.5 % (ref 36.0–46.0)
Hemoglobin: 13.5 g/dL (ref 12.0–15.0)
MCH: 32.5 pg (ref 26.0–34.0)
MCHC: 31.8 g/dL (ref 30.0–36.0)
MCV: 102.2 fL — ABNORMAL HIGH (ref 80.0–100.0)
Platelets: 292 10*3/uL (ref 150–400)
RBC: 4.16 MIL/uL (ref 3.87–5.11)
RDW: 17.2 % — ABNORMAL HIGH (ref 11.5–15.5)
WBC: 12.5 10*3/uL — ABNORMAL HIGH (ref 4.0–10.5)
nRBC: 0.3 % — ABNORMAL HIGH (ref 0.0–0.2)

## 2020-04-24 LAB — COMPREHENSIVE METABOLIC PANEL
ALT: 313 U/L — ABNORMAL HIGH (ref 0–44)
AST: 569 U/L — ABNORMAL HIGH (ref 15–41)
Albumin: 2.6 g/dL — ABNORMAL LOW (ref 3.5–5.0)
Alkaline Phosphatase: 312 U/L — ABNORMAL HIGH (ref 38–126)
Anion gap: 13 (ref 5–15)
BUN: 13 mg/dL (ref 6–20)
CO2: 24 mmol/L (ref 22–32)
Calcium: 8.3 mg/dL — ABNORMAL LOW (ref 8.9–10.3)
Chloride: 101 mmol/L (ref 98–111)
Creatinine, Ser: 0.55 mg/dL (ref 0.44–1.00)
GFR, Estimated: >60 mL/min (ref >60)
Glucose, Bld: 131 mg/dL — ABNORMAL HIGH (ref 70–99)
Potassium: 3.8 mmol/L (ref 3.5–5.1)
Sodium: 138 mmol/L (ref 135–145)
Total Bilirubin: 1.1 mg/dL (ref 0.3–1.2)
Total Protein: 5.1 g/dL — ABNORMAL LOW (ref 6.5–8.1)

## 2020-04-24 LAB — GLUCOSE, CAPILLARY
Glucose-Capillary: 122 mg/dL — ABNORMAL HIGH (ref 70–99)
Glucose-Capillary: 106 mg/dL — ABNORMAL HIGH (ref 70–99)
Glucose-Capillary: 113 mg/dL — ABNORMAL HIGH (ref 70–99)
Glucose-Capillary: 123 mg/dL — ABNORMAL HIGH (ref 70–99)
Glucose-Capillary: 144 mg/dL — ABNORMAL HIGH (ref 70–99)

## 2020-04-24 LAB — T4, FREE: Free T4: 1.88 ng/dL — ABNORMAL HIGH (ref 0.61–1.12)

## 2020-04-24 LAB — MAGNESIUM: Magnesium: 1.6 mg/dL — ABNORMAL LOW (ref 1.7–2.4)

## 2020-04-24 IMAGING — US US ABDOMEN LIMITED
1 series · 14 of 25 positions shown · non-contrast
Comparison: None.

CLINICAL DATA: Elevated liver enzymes

EXAM:
ULTRASOUND ABDOMEN LIMITED RIGHT UPPER QUADRANT

[Series 1: us abdomen limited ruq (liver/gb) · 14 of 36 slices shown]
[im 1/36]
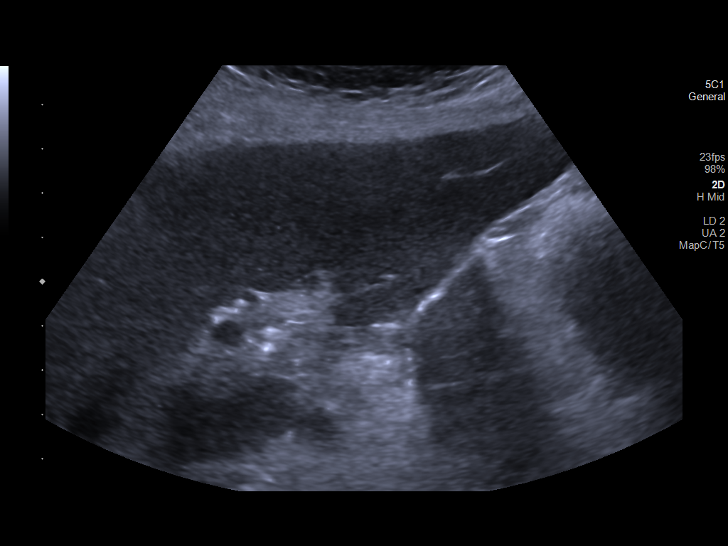
[im 3/36]
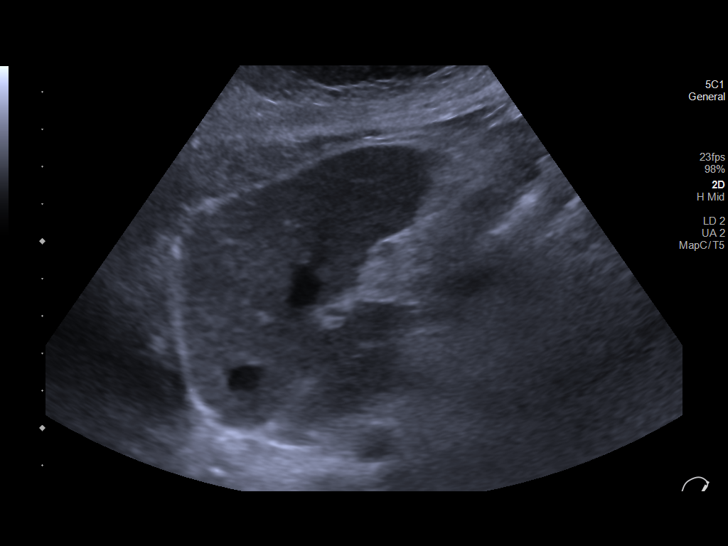
[im 6/36]
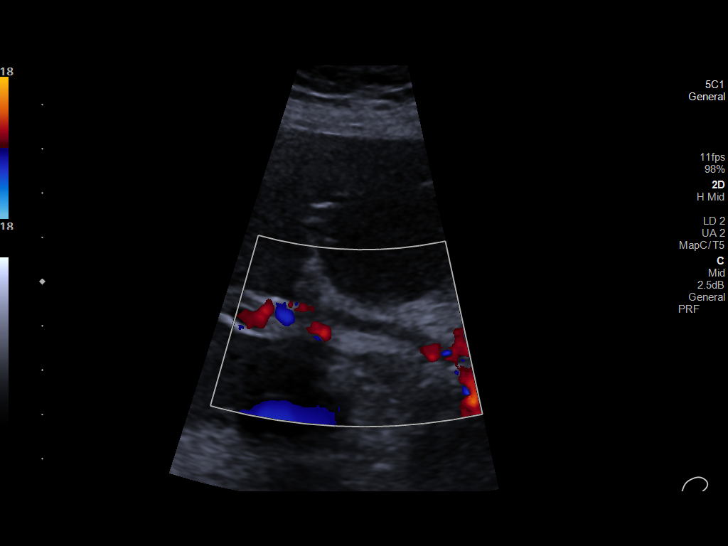
[im 9/36]
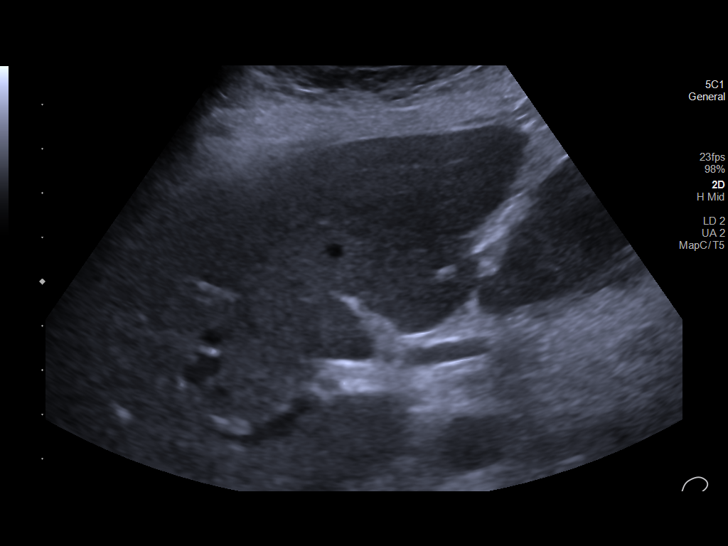
[im 12/36]
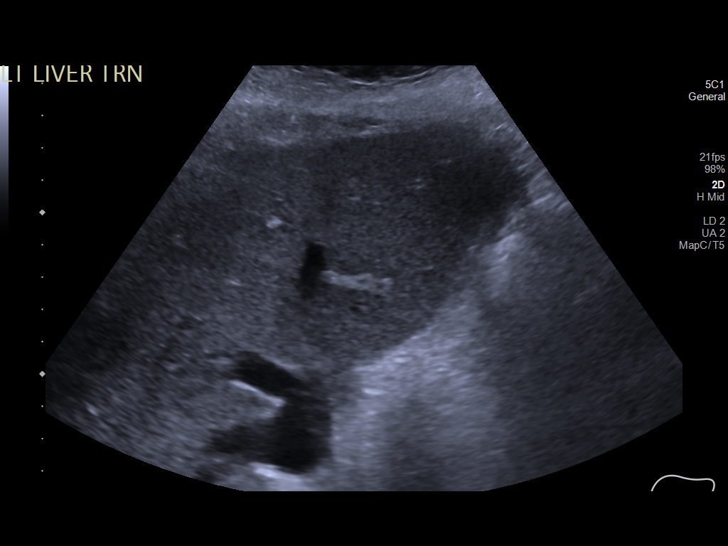
[im 14/36]
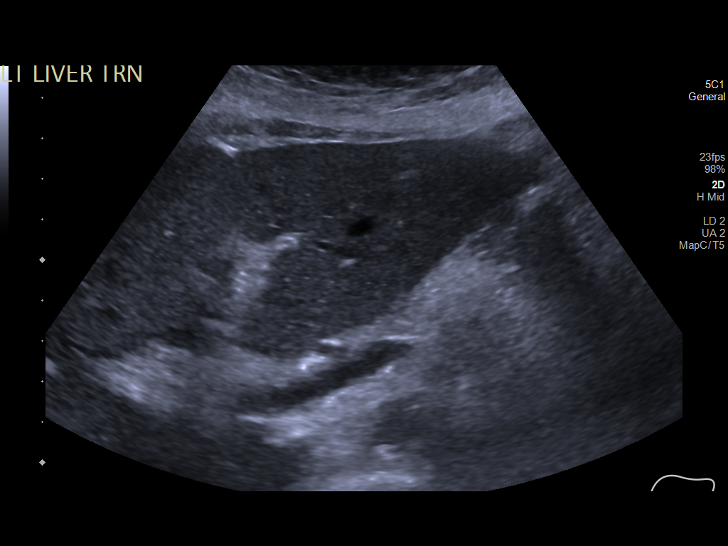
[im 17/36]
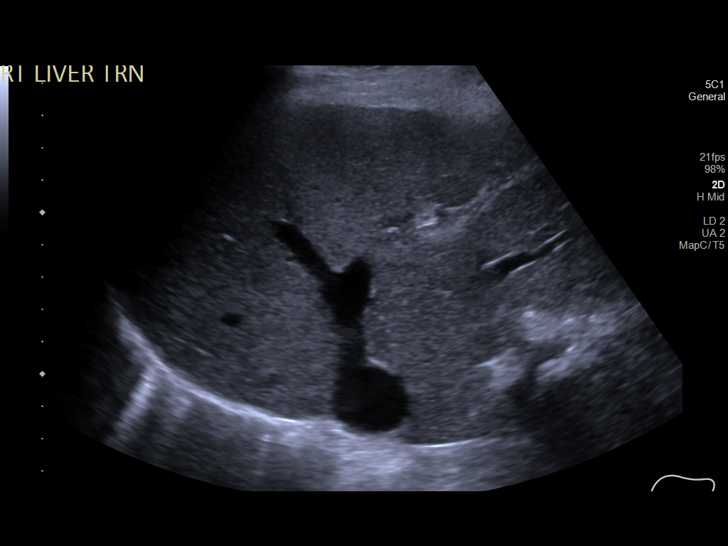
[im 19/36]
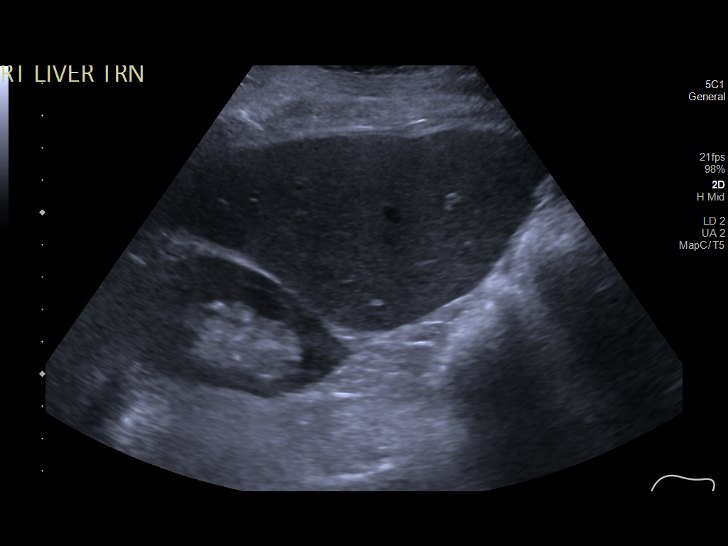
[im 22/36]
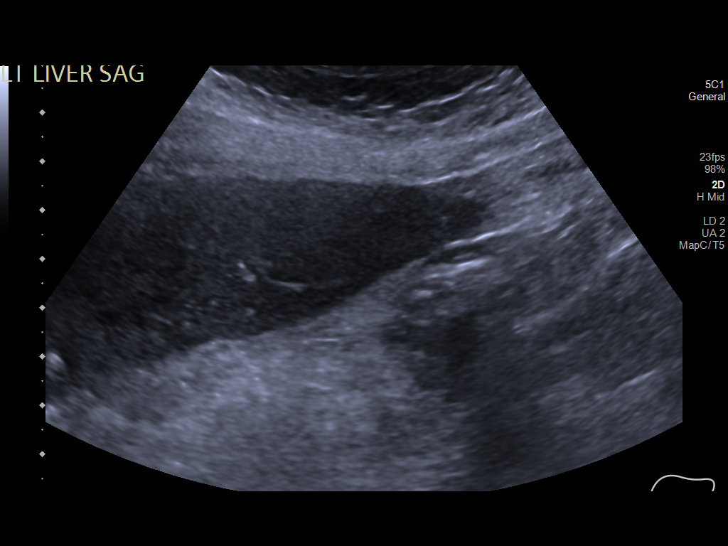
[im 24/36]
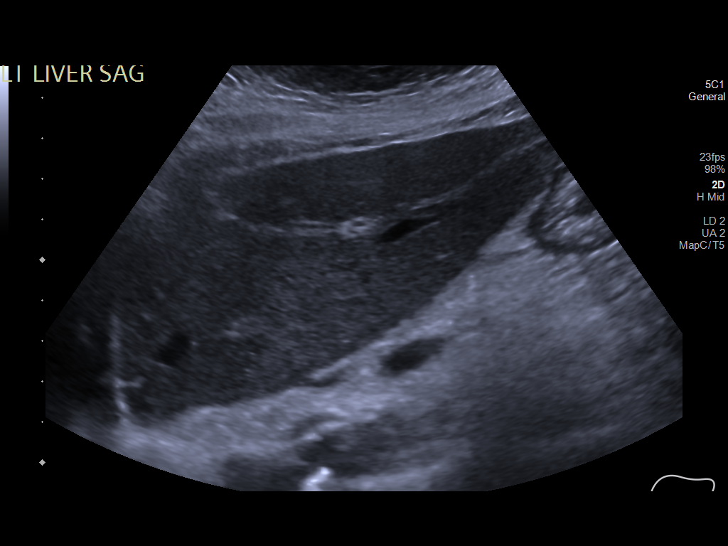
[im 27/36]
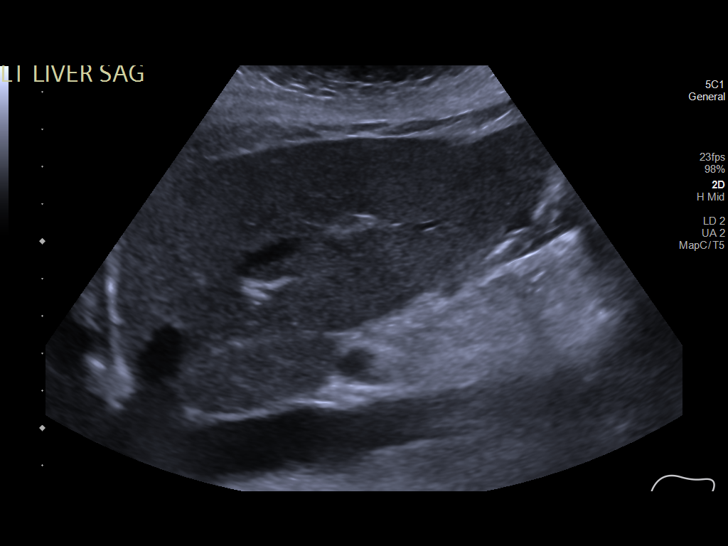
[im 30/36]
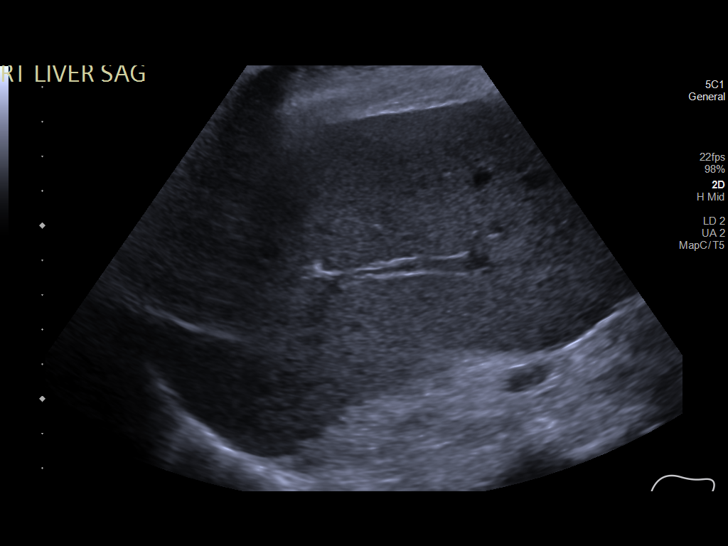
[im 33/36]
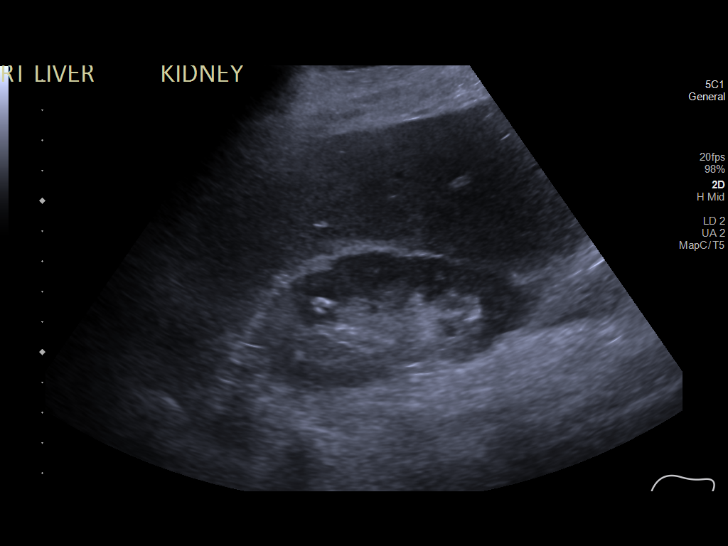
[im 36/36]
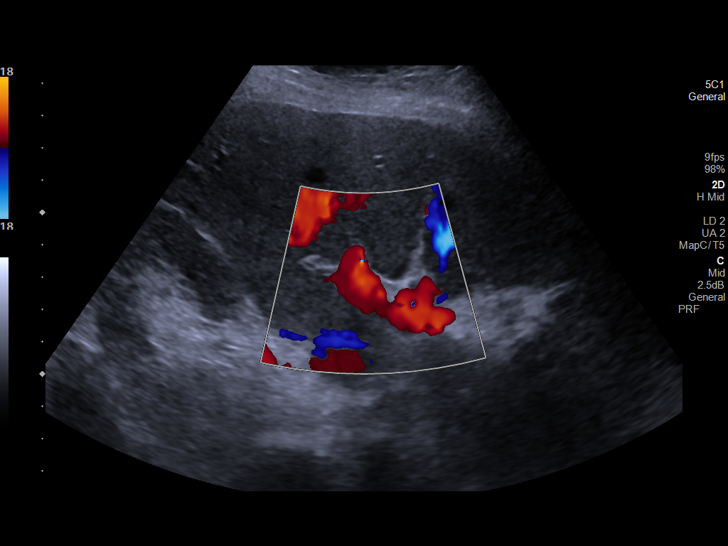

[14 of 25 positions shown; findings below may reference images not displayed]

FINDINGS: Gallbladder:

Surgically absent.

Common bile duct:

Diameter: 5 mm. No intrahepatic or extrahepatic biliary duct
dilatation.

Liver:

No focal lesion identified. Within normal limits in parenchymal
echogenicity. Portal vein is patent on color Doppler imaging with
normal direction of blood flow towards the liver.

Other: None.
IMPRESSION: Gallbladder absent.  Study otherwise unremarkable.

## 2020-04-24 SURGERY — ECHOCARDIOGRAM, TRANSESOPHAGEAL
Anesthesia: Monitor Anesthesia Care

## 2020-04-24 MED ORDER — APIXABAN 5 MG PO TABS
5.0000 mg | ORAL_TABLET | Freq: Two times a day (BID) | ORAL | Status: DC
  Administered 2020-04-24 – 2020-04-26 (×4): 5 mg via ORAL
  Filled 2020-04-24 (×4): qty 1

## 2020-04-24 MED ORDER — ONDANSETRON HCL 4 MG/2ML IJ SOLN
INTRAMUSCULAR | Status: DC | PRN
  Administered 2020-04-24: 4 mg via INTRAVENOUS

## 2020-04-24 MED ORDER — SODIUM CHLORIDE 0.9 % IV SOLN: INTRAVENOUS | Status: DC

## 2020-04-24 MED ORDER — PROPOFOL 10 MG/ML IV BOLUS
INTRAVENOUS | Status: DC | PRN
  Administered 2020-04-24: 75 ug/kg/min via INTRAVENOUS

## 2020-04-24 MED ORDER — MAGNESIUM SULFATE 4 GM/100ML IV SOLN
4.0000 g | Freq: Once | INTRAVENOUS | Status: AC
  Administered 2020-04-24: 4 g via INTRAVENOUS
  Filled 2020-04-24: qty 100

## 2020-04-24 MED ORDER — POTASSIUM CHLORIDE CRYS ER 20 MEQ PO TBCR
20.0000 meq | EXTENDED_RELEASE_TABLET | Freq: Once | ORAL | Status: AC
  Administered 2020-04-24: 20 meq via ORAL
  Filled 2020-04-24: qty 1

## 2020-04-24 MED ORDER — BUTAMBEN-TETRACAINE-BENZOCAINE 2-2-14 % EX AERO
INHALATION_SPRAY | CUTANEOUS | Status: DC | PRN
  Administered 2020-04-24: 2 via TOPICAL

## 2020-04-24 MED ORDER — PHENYLEPHRINE HCL (PRESSORS) 10 MG/ML IV SOLN
INTRAVENOUS | Status: DC | PRN
  Administered 2020-04-24 (×2): 80 ug via INTRAVENOUS

## 2020-04-24 MED ORDER — ASPIRIN EC 81 MG PO TBEC
81.0000 mg | DELAYED_RELEASE_TABLET | Freq: Every day | ORAL | Status: DC
  Administered 2020-04-25 – 2020-04-26 (×2): 81 mg via ORAL
  Filled 2020-04-24 (×2): qty 1

## 2020-04-24 NOTE — Procedures (Signed)
     Transesophageal Echocardiogram Note  Madison Hurst [id number] Feb 02, 1961  Procedure: Transesophageal Echocardiogram Indications: Stroke  Procedure Details Consent: Obtained Time Out: Verified patient identification, verified procedure, site/side was marked, verified correct patient position, special equipment/implants available, Radiology Safety Procedures followed,  medications/allergies/relevent history reviewed, required imaging and test results available.  Performed  Medications: Propofol: 65mg   Left Ventrical:  LVEF is severely reduced 20-25% with global hypokinesis  Mitral Valve: Mild-to-moderate MR  Aortic Valve: Trivial AR, Lambl's excresence visualized  Tricuspid Valve: Trivial TR  Pulmonic Valve: Trivial PR  Left Atrium/ Left atrial appendage: No LAA thrombus  Atrial septum: PFO present with L-->R shunting by color doppler. Agitated saline contrast bubble study positive with valsalva  Aorta: Mild plaquing   Complications: No apparent complications Patient did tolerate procedure well.  , MD 04/24/2020, 8:26 AM

## 2020-04-24 NOTE — Evaluation (Signed)
Physical Therapy Evaluation Patient Details Name: Madison Hurst MRN: [id number] DOB: December 22, 1960 Today's Date: 04/24/2020   History of Present Illness  60 year old female with past medical history significant for paroxysmal atrial fibrillation, CAD, essential hypertension, hyperlipidemia, type 2 diabetes mellitus, rheumatoid arthritis, anxiety, depression who presented to the ED with progressive weakness on the left side with rapid progression over the last week resulting in 3 falls. 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  Clinical Impression  Pt presents to PT with deficits in strength, power, gait, balance, endurance, level of arousal, cognition, and functional mobility. Pt has significant L sided weakness but also reports some R sided weakness compared to baseline. Pt currently requires assistance for all functional mobility tasks and demonstrates a tendency for a R lateral lean when mobilizing. Pt also demonstrates some mild L inattention during session. Pt will benefit from aggressive mobilization and acute PT services to improve strength and functional mobility quality while also reducing falls risk. PT recommends CIR placement at this time as the pt was mobilizing independently prior to admission and demonstrates the potential to make significant functional gains with high intensity inpatient PT services.    Follow Up Recommendations CIR    Equipment Recommendations   (TBD, likely hemi-walker if D/C today)    Recommendations for Other Services Rehab consult     Precautions / Restrictions Precautions Precautions: Fall Restrictions Weight Bearing Restrictions: No      Mobility  Bed Mobility Overal bed mobility: Needs Assistance Bed Mobility: Supine to Sit     Supine to sit: Mod assist;HOB elevated          Transfers Overall transfer level: Needs assistance Equipment used: 1 person hand held  assist Transfers: Sit to/from [employer] Transfers Sit to Stand: Mod assist Stand pivot transfers: Mod assist       General transfer comment: PT provides BUE support and L knee block  Ambulation/Gait Ambulation/Gait assistance:  (deferred 2/2 imbalance)              Stairs            Wheelchair Mobility    Modified Rankin (Stroke Patients Only)       Balance Overall balance assessment: Needs assistance Sitting-balance support: Single extremity supported;No upper extremity supported;Feet supported Sitting balance-Leahy Scale: Poor Sitting balance - Comments: reliant on UE support or minA Postural control: Right lateral lean Standing balance support: Bilateral upper extremity supported Standing balance-Leahy Scale: Poor Standing balance comment: min-modA for static standing with BUE support of PT                             Pertinent Vitals/Pain Pain Assessment: No/denies pain    Home Living Family/patient expects to be discharged to:: Private residence Living Arrangements: Spouse/significant other Available Help at Discharge: Family;Available PRN/intermittently Type of Home: House Home Access: Stairs to enter Entrance Stairs-Rails: Can reach both Entrance Stairs-Number of Steps: 2 Home Layout: One level Home Equipment: Cane - single point;Wheelchair - manual;Shower seat;Bedside commode      Prior Function Level of Independence: Needs assistance   Gait / Transfers Assistance Needed: pt reports ambulating independently, does require assistance picking objects up off the floor due to balance deficits           Hand Dominance        Extremity/Trunk Assessment   Upper Extremity Assessment Upper Extremity Assessment: RUE deficits/detail;LUE deficits/detail RUE Deficits /  Details: grossly 4-/5, shoulder flexion limited to ~100 degrees AROM LUE Deficits / Details: grossly 4-/5, shoulder flexion limited to ~90 degrees AROM. Pt  reports shoulder ROM restrictions present at baseline    Lower Extremity Assessment Lower Extremity Assessment: LLE deficits/detail;RLE deficits/detail RLE Deficits / Details: grossly 4/5 LLE Deficits / Details: 3-/5 knee extension, 3-/5 DF, 2/5 PF    Cervical / Trunk Assessment Cervical / Trunk Assessment: Normal  Communication   Communication: No difficulties  Cognition Arousal/Alertness: Lethargic (becomes alert with mobility but is lethargic when resting) Behavior During Therapy: Flat affect Overall Cognitive Status: Impaired/Different from baseline Area of Impairment: Orientation;Attention;Memory;Following commands;Safety/judgement;Awareness;Problem solving                 Orientation Level: Disoriented to;Time Current Attention Level: Sustained Memory: Decreased recall of precautions;Decreased short-term memory Following Commands: Follows one step commands consistently Safety/Judgement: Decreased awareness of safety;Decreased awareness of deficits Awareness: Emergent Problem Solving: Slow processing;Difficulty sequencing;Requires verbal cues;Requires tactile cues        General Comments General comments (skin integrity, edema, etc.): VSS on RA during session    Exercises     Assessment/Plan    PT Assessment Patient needs continued PT services  PT Problem List Decreased strength;Decreased activity tolerance;Decreased balance;Decreased mobility;Decreased range of motion;Decreased cognition;Decreased knowledge of use of DME;Decreased safety awareness;Decreased knowledge of precautions       PT Treatment Interventions DME instruction;Gait training;Stair training;Functional mobility training;Therapeutic activities;Therapeutic exercise;Balance training;Neuromuscular re-education;Cognitive remediation;Patient/family education    PT Goals (Current goals can be found in the Care Plan section)  Acute Rehab PT Goals Patient Stated Goal: to improve mobility and reduce  falls risk PT Goal Formulation: With patient Time For Goal Achievement: 05/08/20 Potential to Achieve Goals: Good    Frequency Min 4X/week   Barriers to discharge        Co-evaluation               AM-PAC PT "6 Clicks" Mobility  Outcome Measure Help needed turning from your back to your side while in a flat bed without using bedrails?: A Lot Help needed moving from lying on your back to sitting on the side of a flat bed without using bedrails?: A Lot Help needed moving to and from a bed to a chair (including a wheelchair)?: A Lot Help needed standing up from a chair using your arms (e.g., wheelchair or bedside chair)?: A Lot Help needed to walk in hospital room?: A Lot Help needed climbing 3-5 steps with a railing? : Total 6 Click Score: 11    End of Session   Activity Tolerance: Patient tolerated treatment well Patient left: in chair;with call bell/phone within reach;with chair alarm set Nurse Communication: Mobility status PT Visit Diagnosis: Unsteadiness on feet (R26.81);Other abnormalities of gait and mobility (R26.89);Muscle weakness (generalized) (M62.81);Hemiplegia and hemiparesis Hemiplegia - Right/Left: Left Hemiplegia - caused by: Cerebral infarction    Time: 1021-1049 PT Time Calculation (min) (ACUTE ONLY): 28 min   Charges:   PT Evaluation $PT Eval Moderate Complexity: 1 Mod PT Treatments $Therapeutic Activity: 8-22 mins        Arlyss Gandy, PT, DPT Acute Rehabilitation Pager: [phone]   Arlyss Gandy 04/24/2020, 12:42 PM

## 2020-04-24 NOTE — Transfer of Care (Signed)
Immediate Anesthesia Transfer of Care Note  Patient: Madison Hurst  Procedure(s) Performed: TRANSESOPHAGEAL ECHOCARDIOGRAM (TEE) (N/A )  Patient Location: Endoscopy Unit  Anesthesia Type:MAC  Level of Consciousness: sedated  Airway & Oxygen Therapy: Patient connected to nasal cannula oxygen  Post-op Assessment: Post -op Vital signs reviewed and stable  Post vital signs: stable  Last Vitals:  Vitals Value Taken Time  BP    Temp    Pulse    Resp    SpO2      Last Pain:  Vitals:   04/24/20 0710  TempSrc: Oral  PainSc: 7          Complications: No complications documented.

## 2020-04-24 NOTE — Progress Notes (Signed)
Echocardiogram Echocardiogram Transesophageal has been performed.  Warren Lacy Nalini Alcaraz 04/24/2020, 8:48 AM

## 2020-04-24 NOTE — Progress Notes (Signed)
Inpatient Rehab Admissions Coordinator Note:   Per therapy recommendations, pt was screened for CIR candidacy by Estill Dooms, PT, DPT.  At this time we are recommending a CIR consult and I will place an order per our protocol.  Please contact me with questions.   Estill Dooms, PT, DPT [phone] 04/24/20 3:34 PM

## 2020-04-24 NOTE — Progress Notes (Signed)
Pt's husband reached at home number on file, he is willing to speak w/ his wife prior to her scheduled TEE today.

## 2020-04-24 NOTE — Plan of Care (Signed)

## 2020-04-24 NOTE — Anesthesia Postprocedure Evaluation (Signed)
Anesthesia Post Note  Patient: Aerielle Stoklosa  Procedure(s) Performed: TRANSESOPHAGEAL ECHOCARDIOGRAM (TEE) (N/A ) BUBBLE STUDY     Patient location during evaluation: Endoscopy Anesthesia Type: MAC Level of consciousness: awake and alert Pain management: pain level controlled Vital Signs Assessment: post-procedure vital signs reviewed and stable Respiratory status: spontaneous breathing, nonlabored ventilation and respiratory function stable Cardiovascular status: stable and blood pressure returned to baseline Postop Assessment: no apparent nausea or vomiting Anesthetic complications: no   No complications documented.  Last Vitals:  Vitals:   04/24/20 1200 04/24/20 1210  BP: 104/86   Pulse: 80 88  Resp: 16   Temp:  (!) 35.7 C  SpO2: 100% 98%    Last Pain:  Vitals:   04/24/20 1210  TempSrc: Oral  PainSc:                  Catalina Gravel

## 2020-04-24 NOTE — Progress Notes (Signed)
PROGRESS NOTE    Madison Hurst  GYI:[id number] DOB: 1960-07-10 DOA: 04/22/2020 PCP: Serita Grammes, MD    Brief Narrative:  Madison Hurst is a 60 year old female with past medical history significant for paroxysmal atrial fibrillation, CAD, essential hypertension, hyperlipidemia, type 2 diabetes mellitus, rheumatoid arthritis, anxiety, depression who presented to the ED with progressive weakness on the left side with rapid progression over the last week.  Patient reports over the last 2 days, falls on 3 occasions.  Patient states when she fell today, she hit her head slightly.  Patient also with weakness feeling like she is going to pass out, but not as loss of consciousness or syncopal event.  Patient also reports associated headaches that are intermittent with associated sweats; in which these have been present for roughly 1 year.  In the ED, afebrile, HR 125, RR 14, BP 125/102, SPO2 100% on room air.  Sodium 137, potassium 3.7, chloride 98, CO2 28, glucose 173, BUN 15, creatinine 0.59, AST 65, ALT 62.  CK 87.  WBC 13.2, hemoglobin 14.9, platelets 342.  INR 1.3.  TSH 0.740.  SARS-CoV-2 negative.  EtOH level less than 10.  Urinalysis with moderate leukocytes, negative nitrite, rare bacteria, 11-20 WBCs.  UDS positive for opiates.  CT head without contrast with no acute intracranial abnormality.  MRI head positive for acute infarction consistent with posterior circulation embolic disease without signs of hemorrhage.  Patient was given 1 L NS bolus, 5 mg of metoprolol IV and 3 and 24 mg of aspirin.  Neurology was consulted.  Hospitalist service consulted for further evaluation and management of acute CVA.   Assessment & Plan:   Principal Problem:   CVA (cerebral vascular accident) ([place]) Active Problems:   Anxiety   Hypothyroidism   Seropositive rheumatoid arthritis ([place])   Type 2 diabetes mellitus (HCC)   PAT (paroxysmal atrial tachycardia) (HCC)    Leukocytosis   Essential hypertension   CVA, acute; concerning for embolic etiology Patient presenting to the ED following several falls with associated left-sided weakness progressing over the last week.  MR brain notable for acute infarct consistent with posterior circulation embolic disease.  CTA head/neck with no large vessel occlusion.  Total cholesterol 159, HDL 49, LDL 88.  ESR 3.  TSH 0.740.  Hemoglobin A1c 6.1.  TTE with LVEF 20-25% (40-45% Dec 2021), LV global hypokinesis, RV systolic function within normal limits, LA moderately dilated, IVC normal in size.  TEE 1/26 with LVEF 20-25 percent, with global hypokinesis and PFO with left to right shunt. --Neurology following, appreciate assistance --Aspirin 81 mg p.o. daily and Plavix 75 mg p.o. daily for 3 months followed by aspirin alone --PT recommends CIR --Pending OT/SLP evaluation --Continue monitor on telemetry  Paroxysmal atrial tachycardia/sinus tachycardia On admission, patient notable with elevated heart rates in the 120s.  Patient also reports she had an abnormal coronary CT in which she is following with cardiology, Dr. Harriet Masson outpatient. --Cardiology following --Metoprolol succinate 12.5 mg p.o. daily --Continue to monitor on telemetry  Patent foramen ovale TEE 1/26 notable for PFO with left-to-right shunt.  Question etiology of her acute stroke as embolic etiology likely. --Await cardiology and neurology recommendations regarding need for possible closure  Ischemic cardiomyopathy Chronic systolic congestive heart failure TTE with LVEF 20-25% (was 40-45% Dec 2021), with LV global hypokinesis. --Likely needs further ischemic work-up with [facility]; cardiology following --Irbesartan 75 mg p.o. daily --Metoprolol succinate 12.5 mg p.o. daily --holding statin 2/2 elevated LFTs  Transaminitis: Acute.  R factor equal to 2.87, consistent with mixed hepatocellular/cholestatic injury pattern --Acute hepatitis panel  negative --Alkaline phosphatase 162>218>312 --AST [phone] --ALT 62>121>313 --Total bilirubin 0.9>1.1>1.1 --hold home statin --Check right upper quadrant ultrasound --Check ANA, AsMA, ceruloplasmin, antimicrosomal ab Liver/kidney --Trend LFTs daily  Essential hypertension Home regimen includes metoprolol 12.5 mg daily and telmisartan 20 mg daily. --Metoprolol succinate 12.5 mg p.o. daily --Irbesartan 75 mg p.o. daily (hospital substitution for telmisartan)  Rheumatoid arthritis:  Patient with swelling of the bilateral hands that she states is chronic due to rheumatoid arthritis.  Home medication regimen includes prednisone 5 mg daily, Orencia, flurbiprofen 100 mg twice daily. --Continue prednisone  --Holding home flurbiprofen due to risk of thrombotic event  Diabetes mellitus type 2:  On admission glucose elevated up to 173.  Home medications include Metformin 500 mg daily.  Hemoglobin A1c 6.1, well controlled. --Hold Metformin --Sensitive SSI for coverage --CBGs qAC/HS  Anxiety and depression --Continue home Wellbutrin 150 mg daily and Cymbalta 60 mg daily  Hyperlipidemia:  Lipid panel with total cholesterol 159, HDL 49, LDL 88. --Holding home Crestor due to elevated LFTs --Monitor LFTs daily  Hypothyroidism:  TSH on admission was 0.74.   --Continue home levothyroxine 75 mcg daily. --Checking free T3/free T4 as above  Hypokalemia Hypomagnesemia Potassium 3.8, magnesium 1.6, will replete. --repeat electrolytes in the am  DVT prophylaxis: Lovenox   Code Status: Full Code Family Communication: Updated patient spouse was present at bedside  Disposition Plan:  Level of care: Telemetry Medical Status is: Inpatient  Remains inpatient appropriate because:Ongoing diagnostic testing needed not appropriate for outpatient work up, Unsafe d/c plan, IV treatments appropriate due to intensity of illness or inability to take PO and Inpatient level of care appropriate due  to severity of illness   Dispo: The patient is from: Home              Anticipated d/c is to: To be determined pending therapy evaluation              Anticipated d/c date is: 2 days              Patient currently is not medically stable to d/c.   Difficult to place patient No   Consultants:   Cardiology  Neurology  Procedures:   Vascular duplex ultrasound bilateral lower extremities  TTE  Antimicrobials:   None   Subjective: Patient seen and examined bedside, sitting in bedside chair.  Just returned from TEE.  Husband present.  Patient feels tired and fatigued, had some breakfast but not too hungry this morning.  Also continues with weakness of left upper and lower extremity.  Discussed with patient's husband findings on TEE of PFO.  No other complaints or concerns at this time.  Denies headache, no chest pain, no palpitations, no shortness of breath, no abdominal pain, no fever/chills/night sweats, no dysuria, no cough/congestion, no nausea/vomiting/diarrhea.  No acute events overnight per nursing staff.  Objective: Vitals:   04/24/20 0855 04/24/20 0900 04/24/20 0940 04/24/20 1018  BP: 105/73 116/85  (!) 125/92  Pulse:      Resp: 13 14 16 13   Temp:    97.6 F (36.4 C)  TempSrc:    Axillary  SpO2:  97% 100% 100%  Weight:      Height:        Intake/Output Summary (Last 24 hours) at 04/24/2020 1235 Last data filed at 04/24/2020 0813 Gross per 24 hour  Intake 640 ml  Output 400 ml  Net 240  ml   Filed Weights   04/22/20 1258 04/23/20 1552  Weight: 55.8 kg 58.4 kg    Examination:  General exam: Appears calm and comfortable  Respiratory system: Clear to auscultation. Respiratory effort normal.  Oxygenating well on room air Cardiovascular system: S1 & S2 heard, RRR. No JVD, murmurs, rubs, gallops or clicks. No pedal edema. Gastrointestinal system: Abdomen is nondistended, soft and nontender. No organomegaly or masses felt. Normal bowel sounds heard. Central  nervous system: Alert and oriented.  Left upper/lower extremity weakness deficits noted, otherwise no focal neurological deficits appreciated. Extremities: Muscle strength decreased LUE 4/5 and LLE 4-/5 Skin: No rashes, lesions or ulcers Psychiatry: Judgement and insight appear poor. Mood & affect appropriate.     Data Reviewed: I have personally reviewed following labs and imaging studies  CBC: Recent Labs  Lab 04/22/20 1342 04/23/20 0337 04/24/20 0114  WBC 13.2* 13.0* 12.5*  NEUTROABS 10.6*  --   --   HGB 14.9 14.5 13.5  HCT 49.0* 47.7* 42.5  MCV 100.8* 102.4* 102.2*  PLT 342 319 540   Basic Metabolic Panel: Recent Labs  Lab 04/22/20 1342 04/23/20 0337 04/24/20 0114  NA 137 136 138  K 3.7 3.5 3.8  CL 98 99 101  CO2 28 24 24   GLUCOSE 173* 127* 131*  BUN 15 13 13   CREATININE 0.59 0.64 0.55  CALCIUM 8.6* 8.0* 8.3*  MG  --   --  1.6*   GFR: Estimated Creatinine Clearance: 62.1 mL/min (by C-G formula based on SCr of 0.55 mg/dL). Liver Function Tests: Recent Labs  Lab 04/22/20 1342 04/23/20 0337 04/24/20 0114  AST 65* 203* 569*  ALT 62* 121* 313*  ALKPHOS 162* 218* 312*  BILITOT 0.9 1.1 1.1  PROT 5.3* 5.3* 5.1*  ALBUMIN 2.7* 2.7* 2.6*   No results for input(s): LIPASE, AMYLASE in the last 168 hours. No results for input(s): AMMONIA in the last 168 hours. Coagulation Profile: Recent Labs  Lab 04/22/20 1342  INR 1.3*   Cardiac Enzymes: Recent Labs  Lab 04/22/20 1342  CKTOTAL 87   BNP (last 3 results) No results for input(s): PROBNP in the last 8760 hours. HbA1C: Recent Labs    04/22/20 1838  HGBA1C 6.1*   CBG: Recent Labs  Lab 04/23/20 1841 04/23/20 2101 04/24/20 0602 04/24/20 0843 04/24/20 1219  GLUCAP 128* 120* 122* 106* 113*   Lipid Profile: Recent Labs    04/22/20 1838  CHOL 159  HDL 49  LDLCALC 88  TRIG 111  CHOLHDL 3.2   Thyroid Function Tests: Recent Labs    04/22/20 1453  TSH 0.740   Anemia Panel: Recent Labs     04/23/20 0337  VITAMINB12 659  FOLATE 50.7   Sepsis Labs: No results for input(s): PROCALCITON, LATICACIDVEN in the last 168 hours.  Recent Results (from the past 240 hour(s))  SARS Coronavirus 2 by RT PCR (hospital order, performed in [facility] hospital lab) Nasopharyngeal Nasopharyngeal Swab     Status: None   Collection Time: 04/22/20  1:42 PM   Specimen: Nasopharyngeal Swab  Result Value Ref Range Status   SARS Coronavirus 2 NEGATIVE NEGATIVE Final    Comment: (NOTE) SARS-CoV-2 target nucleic acids are NOT DETECTED.  The SARS-CoV-2 RNA is generally detectable in upper and lower respiratory specimens during the acute phase of infection. The lowest concentration of SARS-CoV-2 viral copies this assay can detect is 250 copies / mL. A negative result does not preclude SARS-CoV-2 infection and should not be used as the  sole basis for treatment or other patient management decisions.  A negative result may occur with improper specimen collection / handling, submission of specimen other than nasopharyngeal swab, presence of viral mutation(s) within the areas targeted by this assay, and inadequate number of viral copies (<250 copies / mL). A negative result must be combined with clinical observations, patient history, and epidemiological information.  Fact Sheet for Patients:   StrictlyIdeas.no  Fact Sheet for Healthcare Providers: BankingDealers.co.za  This test is not yet approved or  cleared by the [place] FDA and has been authorized for detection and/or diagnosis of SARS-CoV-2 by FDA under an Emergency Use Authorization (EUA).  This EUA will remain in effect (meaning this test can be used) for the duration of the COVID-19 declaration under Section 564(b)(1) of the Act, 21 U.S.C. section 360bbb-3(b)(1), unless the authorization is terminated or revoked sooner.  Performed at [facility], [place] [address]  [place], [place] [address]          Radiology Studies: CT Code Stroke CTA Head W/WO contrast  Result Date: 04/22/2020 CLINICAL DATA:  Worsening left-sided weakness for 2 weeks. Recent TIA. Fall today. Slurred speech. EXAM: CT ANGIOGRAPHY HEAD AND NECK TECHNIQUE: Multidetector CT imaging of the head and neck was performed using the standard protocol during bolus administration of intravenous contrast. Multiplanar CT image reconstructions and MIPs were obtained to evaluate the vascular anatomy. Carotid stenosis measurements (when applicable) are obtained utilizing NASCET criteria, using the distal internal carotid diameter as the denominator. CONTRAST:  20m OMNIPAQUE IOHEXOL 350 MG/ML SOLN COMPARISON:  Brain MRI 04/22/2020 Head CT 04/22/2020 FINDINGS: CTA NECK FINDINGS SKELETON: There is no bony spinal canal stenosis. No lytic or blastic lesion. OTHER NECK: Normal pharynx, larynx and major salivary glands. No cervical lymphadenopathy. Enlarged thyroid gland. UPPER CHEST: Small left pleural effusion with fluid in the fissure. AORTIC ARCH: There is no calcific atherosclerosis of the aortic arch. There is no aneurysm, dissection or hemodynamically significant stenosis of the visualized portion of the aorta. Conventional 3 vessel aortic branching pattern. The visualized proximal subclavian arteries are widely patent. RIGHT CAROTID SYSTEM: Normal without aneurysm, dissection or stenosis. LEFT CAROTID SYSTEM: No dissection, occlusion or aneurysm. Mild atherosclerotic calcification at the carotid bifurcation without hemodynamically significant stenosis. VERTEBRAL ARTERIES: Codominant configuration. Both origins are clearly patent. There is no dissection, occlusion or flow-limiting stenosis to the skull base (V1-V3 segments). CTA HEAD FINDINGS POSTERIOR CIRCULATION: --Vertebral arteries: Normal V4 segments. --Inferior cerebellar arteries: Normal. --Basilar artery: Normal. --Superior cerebellar arteries: Normal.  --Posterior cerebral arteries (PCA): Severe stenosis of the right PCA P3 segment over a length 7 mm (12:17). The distal right PCA is normal. Mild stenosis of the left P3 segment in a similar location (12:24). ANTERIOR CIRCULATION: --Intracranial internal carotid arteries: Normal. --Anterior cerebral arteries (ACA): Normal. Hypoplastic right A1 segment, normal variant. --Middle cerebral arteries (MCA): Normal. VENOUS SINUSES: As permitted by contrast timing, patent. Incidentally noted gas in the cavernous sinuses related to contrast injection. ANATOMIC VARIANTS: None Review of the MIP images confirms the above findings. IMPRESSION: 1. No emergent large vessel occlusion. 2. 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 3. Mild stenosis of the left PCA P3 segment. 4. Small left pleural effusion with fluid in the fissure. 5. Enlarged thyroid gland. This has been evaluated on previous imaging. (ref: J Am Coll Radiol. 2015 Feb;12(2): 143-50). Electronically Signed   By: KUlyses JarredM.D.   On: 04/22/2020 20:14   CT  Head Wo Contrast  Result Date: 04/22/2020 CLINICAL DATA:  Tube deficit. Progressive weakness, greater on the left. Fell today. EXAM: CT HEAD WITHOUT CONTRAST TECHNIQUE: Contiguous axial images were obtained from the base of the skull through the vertex without intravenous contrast. COMPARISON:  02/16/2020 FINDINGS: Brain: No evidence of acute infarction, hemorrhage, hydrocephalus, extra-axial collection or mass lesion/mass effect. Subcortical and periventricular white matter hypodensity most likely result of chronic ischemic small vessel disease. Similar findings can be seen with demyelinating disease of the white matter. Diffuse cortical atrophy again seen. Vascular: No hyperdense vessel or unexpected calcification. Skull: Normal. Negative for fracture or focal lesion. Sinuses/Orbits: No acute finding. Other: None. IMPRESSION: No acute intracranial abnormality. Electronically Signed    By: Miachel Roux M.D.   On: 04/22/2020 14:29   CT Code Stroke CTA Neck W/WO contrast  Result Date: 04/22/2020 CLINICAL DATA:  Worsening left-sided weakness for 2 weeks. Recent TIA. Fall today. Slurred speech. EXAM: CT ANGIOGRAPHY HEAD AND NECK TECHNIQUE: Multidetector CT imaging of the head and neck was performed using the standard protocol during bolus administration of intravenous contrast. Multiplanar CT image reconstructions and MIPs were obtained to evaluate the vascular anatomy. Carotid stenosis measurements (when applicable) are obtained utilizing NASCET criteria, using the distal internal carotid diameter as the denominator. CONTRAST:  64m OMNIPAQUE IOHEXOL 350 MG/ML SOLN COMPARISON:  Brain MRI 04/22/2020 Head CT 04/22/2020 FINDINGS: CTA NECK FINDINGS SKELETON: There is no bony spinal canal stenosis. No lytic or blastic lesion. OTHER NECK: Normal pharynx, larynx and major salivary glands. No cervical lymphadenopathy. Enlarged thyroid gland. UPPER CHEST: Small left pleural effusion with fluid in the fissure. AORTIC ARCH: There is no calcific atherosclerosis of the aortic arch. There is no aneurysm, dissection or hemodynamically significant stenosis of the visualized portion of the aorta. Conventional 3 vessel aortic branching pattern. The visualized proximal subclavian arteries are widely patent. RIGHT CAROTID SYSTEM: Normal without aneurysm, dissection or stenosis. LEFT CAROTID SYSTEM: No dissection, occlusion or aneurysm. Mild atherosclerotic calcification at the carotid bifurcation without hemodynamically significant stenosis. VERTEBRAL ARTERIES: Codominant configuration. Both origins are clearly patent. There is no dissection, occlusion or flow-limiting stenosis to the skull base (V1-V3 segments). CTA HEAD FINDINGS POSTERIOR CIRCULATION: --Vertebral arteries: Normal V4 segments. --Inferior cerebellar arteries: Normal. --Basilar artery: Normal. --Superior cerebellar arteries: Normal. --Posterior  cerebral arteries (PCA): Severe stenosis of the right PCA P3 segment over a length 7 mm (12:17). The distal right PCA is normal. Mild stenosis of the left P3 segment in a similar location (12:24). ANTERIOR CIRCULATION: --Intracranial internal carotid arteries: Normal. --Anterior cerebral arteries (ACA): Normal. Hypoplastic right A1 segment, normal variant. --Middle cerebral arteries (MCA): Normal. VENOUS SINUSES: As permitted by contrast timing, patent. Incidentally noted gas in the cavernous sinuses related to contrast injection. ANATOMIC VARIANTS: None Review of the MIP images confirms the above findings. IMPRESSION: 1. No emergent large vessel occlusion. 2. 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 3. Mild stenosis of the left PCA P3 segment. 4. Small left pleural effusion with fluid in the fissure. 5. Enlarged thyroid gland. This has been evaluated on previous imaging. (ref: J Am Coll Radiol. 2015 Feb;12(2): 143-50). Electronically Signed   By: KUlyses JarredM.D.   On: 04/22/2020 20:14   MR BRAIN WO CONTRAST  Result Date: 04/22/2020 CLINICAL DATA:  Acute neurological deficit. Progressive weakness, worse on the left. EXAM: MRI HEAD WITHOUT CONTRAST TECHNIQUE: Multiplanar, multiecho pulse sequences of the brain and surrounding structures were obtained without intravenous  contrast. COMPARISON:  Head CT same day.  MRI 02/28/2020. FINDINGS: Brain: Diffusion imaging shows acute infarction affecting the right mid-posterior caudate an the superior thalamus. Single separate punctate acute infarction in the cortex of the medial right parietooccipital junction. Few small punctate acute infarctions in the right occipital cortex. 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 Elsewhere, there is chronic small vessel ischemic change affecting the hemispheric deep and subcortical white matter, right more than left. No focal insult affects the brainstem or cerebellum. No evidence of  mass, hemorrhage, hydrocephalus or extra-axial collection Vascular: Major vessels at the base of the brain show flow. Skull and upper cervical spine: Negative Sinuses/Orbits: Clear/normal Other: None IMPRESSION: 1. Acute infarction affecting the right mid-posterior caudate and right superior thalamus. Single separate punctate acute infarction in the cortex of the medial right parietooccipital junction. Few small punctate acute infarctions in the right occipital cortex. 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 No evidence of hemorrhage or mass effect. 2. Chronic small-vessel ischemic changes elsewhere throughout the brain as outlined above. Electronically Signed   By: Nelson Chimes M.D.   On: 04/22/2020 17:12   MR CERVICAL SPINE WO CONTRAST  Result Date: 04/23/2020 CLINICAL DATA:  Myelopathy, acute or progressive. Additional history provided: Patient reports worsening weakness on the left side, most notably within the last week, 3 falls in the last 2 days weakness all over, but the left-sided seems weaker than the right. EXAM: MRI CERVICAL SPINE WITHOUT CONTRAST TECHNIQUE: Multiplanar, multisequence MR imaging of the cervical spine was performed. No intravenous contrast was administered. COMPARISON:  CT head/neck 04/22/2020.  CT cervical spine 01/26/2015. FINDINGS: All sequences are moderate to severely motion degraded, significantly limiting evaluation. Alignment: Straightening of the expected cervical lordosis. No significant spondylolisthesis. Vertebrae: No significant marrow edema or focal suspicious osseous [place] is identified. Cord: Motion degradation precludes adequate evaluation for spinal cord signal abnormality. Posterior Fossa, vertebral arteries, paraspinal tissues: Cerebellar atrophy. No acute abnormality identified within included portions of the posterior fossa. Cervical vertebral artery flow voids poorly assessed due to motion degradation. No paraspinal soft tissue abnormality  is identified. Disc levels: Multilevel disc degeneration. Most notably, there is moderate disc degeneration at the C4-C5, C5-C6 and C6-C7 levels. C2-C3: Disc bulge. Facet hypertrophy. No significant spinal canal stenosis. Mild bilateral neural foraminal narrowing. C3-C4: Disc bulge. Bilateral disc osteophyte ridge/uncinate hypertrophy (greater on the right). Facet and ligamentum flavum hypertrophy. Apparent moderate spinal canal stenosis. Bilateral neural foraminal narrowing (severe right, mild left). C4-C5: Posterior disc osteophyte complex. Uncovertebral, facet and ligamentum flavum hypertrophy. Apparent moderate/severe spinal canal stenosis. Bilateral neural foraminal narrowing (moderate/severe right, moderate left). C5-C6: Posterior disc osteophyte complex. Uncovertebral, facet and ligamentum flavum hypertrophy. Apparent moderate/severe spinal canal stenosis. Bilateral neural foraminal narrowing (moderate right, moderate/severe left). C6-C7: Posterior disc osteophyte complex. Uncovertebral, facet and ligamentum flavum hypertrophy. Apparent moderate spinal canal stenosis. Bilateral neural foraminal narrowing (moderate/severe right, moderate left). C7-T1: Mild endplate spurring on the left. Mild facet hypertrophy (greater on the left). No appreciable disc herniation or spinal canal stenosis. Mild left neural foraminal narrowing. IMPRESSION: All acquired sequences are moderate to severely motion degraded, significantly limiting evaluation. A repeat examination should be considered when the patient is better able to tolerate the study. Cervical spondylosis as outlined. Most notably, there is apparent moderate/severe spinal canal stenosis at C4-C5 and C5-C6, and apparent moderate spinal canal stenosis at C3-C4 and C6-C7. Motion degradation limits evaluation for subtle spinal cord mass effect and precludes adequate evaluation for spinal cord signal abnormality. Multilevel  neural foraminal narrowing as detailed and  greatest on the right at C3-C4 (severe), on the right at C4-C5 (moderate/severe), on the left at C5-C6 (moderate/severe) and on the right at C6-C7 (moderate/severe). Electronically Signed   By: Kellie Simmering DO   On: 04/23/2020 12:27   DG Chest Port 1 View  Result Date: 04/22/2020 CLINICAL DATA:  Weakness EXAM: PORTABLE CHEST 1 VIEW COMPARISON:  Portable exam 1345 hours compared to 12/21/2012 FINDINGS: Upper normal heart size post median sternotomy. Mediastinal contours and pulmonary vascularity normal. Subsegmental atelectasis versus scarring at LEFT base. Remaining lungs clear. No acute infiltrate, pleural effusion, or pneumothorax. Bones demineralized with note of a LEFT shoulder prosthesis and advanced RIGHT glenohumeral degenerative changes. IMPRESSION: Atelectasis versus scarring at LEFT base. Electronically Signed   By: Lavonia Dana M.D.   On: 04/22/2020 14:01   ECHOCARDIOGRAM COMPLETE  Result Date: 04/23/2020    ECHOCARDIOGRAM REPORT   Patient Name:   Shella CAROL Olena Leatherwood Date of Exam: 04/23/2020 Medical Rec #:  [id number]                     Height:       60.0 in Accession #:    [id number]                    Weight:       123.0 lb Date of Birth:  [birth year]                    BSA:          1.518 m Patient Age:    10 years                      BP:           115/102 mmHg Patient Gender: F                             HR:           107 bpm. Exam Location:  Inpatient Procedure: 2D Echo and Color Doppler Indications:    Stroke 434.91 / I163.9  History:        Patient has prior history of Echocardiogram examinations, most                 recent 03/11/2020. Signs/Symptoms:Dyspnea; Risk                 Factors:Diabetes. Hypothyroidism.  Sonographer:    Tiffany Dance Referring Phys: [id number] RONDELL A SMITH IMPRESSIONS  1. Left ventricular ejection fraction, by estimation, is 20 to 25%. The left ventricle has severely decreased function. The left ventricle demonstrates global hypokinesis. Left  ventricular diastolic parameters are indeterminate.  2. Right ventricular systolic function is normal. The right ventricular size is normal.  3. Left atrial size was moderately dilated.  4. The mitral valve is normal in structure. Mild mitral valve regurgitation. No evidence of mitral stenosis.  5. The aortic valve is tricuspid. Aortic valve regurgitation is not visualized. No aortic stenosis is present.  6. The inferior vena cava is normal in size with greater than 50% respiratory variability, suggesting right atrial pressure of 3 mmHg. FINDINGS  Left Ventricle: Left ventricular ejection fraction, by estimation, is 20 to 25%. The left ventricle has severely decreased function. The left ventricle demonstrates global hypokinesis. The left ventricular internal cavity size was normal in size. There is no left  ventricular hypertrophy. Left ventricular diastolic parameters are indeterminate. Right Ventricle: The right ventricular size is normal. Right ventricular systolic function is normal. Left Atrium: Left atrial size was moderately dilated. Right Atrium: Right atrial size was normal in size. Pericardium: There is no evidence of pericardial effusion. Mitral Valve: The mitral valve is normal in structure. Mild mitral annular calcification. Mild mitral valve regurgitation. No evidence of mitral valve stenosis. Tricuspid Valve: The tricuspid valve is normal in structure. Tricuspid valve regurgitation is trivial. No evidence of tricuspid stenosis. Aortic Valve: The aortic valve is tricuspid. Aortic valve regurgitation is not visualized. No aortic stenosis is present. Pulmonic Valve: The pulmonic valve was grossly normal. Pulmonic valve regurgitation is not visualized. No evidence of pulmonic stenosis. Aorta: The aortic root is normal in size and structure. Venous: The inferior vena cava is normal in size with greater than 50% respiratory variability, suggesting right atrial pressure of 3 mmHg. IAS/Shunts: No atrial level  shunt detected by color flow Doppler.  LEFT VENTRICLE PLAX 2D LVIDd:         4.70 cm LVIDs:         4.00 cm LV PW:         1.20 cm LV IVS:        0.70 cm LVOT diam:     1.90 cm LV SV:         19 LV SV Index:   13 LVOT Area:     2.84 cm  RIGHT VENTRICLE            IVC RV Basal diam:  2.20 cm    IVC diam: 2.10 cm RV S prime:     6.42 cm/s TAPSE (M-mode): 1.2 cm LEFT ATRIUM             Index       RIGHT ATRIUM           Index LA diam:        5.10 cm 3.36 cm/m  RA Area:     12.90 cm LA Vol (A2C):   68.6 ml 45.19 ml/m RA Volume:   27.30 ml  17.98 ml/m LA Vol (A4C):   64.6 ml 42.55 ml/m LA Biplane Vol: 67.3 ml 44.33 ml/m  AORTIC VALVE LVOT Vmax:   48.75 cm/s LVOT Vmean:  30.800 cm/s LVOT VTI:    0.068 m  AORTA Ao Root diam: 2.70 cm Ao Asc diam:  3.40 cm MITRAL VALVE MV Area (PHT): 5.13 cm    SHUNTS MV Decel Time: 148 msec    Systemic VTI:  0.07 m MV E velocity: 92.40 cm/s  Systemic Diam: 1.90 cm MV A velocity: 71.10 cm/s MV E/A ratio:  1.30 Kirk Ruths MD Electronically signed by Kirk Ruths MD Signature Date/Time: 04/23/2020/2:25:22 PM    Final    ECHO TEE  Result Date: 04/24/2020    TRANSESOPHOGEAL ECHO REPORT   Patient Name:   Marya Amsler Date of Exam: 04/24/2020 Medical Rec #:  [id number]                     Height:       60.0 in Accession #:    [id number]                    Weight:       128.7 lb Date of Birth:  26-Dec-1960                    BSA:  1.548 m Patient Age:    37 years                      BP:           135/98 mmHg Patient Gender: F                             HR:           90 bpm. Exam Location:  Inpatient Procedure: Transesophageal Echo, 3D Echo, Color Doppler and Cardiac Doppler Indications:     Stroke i163.9  History:         Patient has prior history of Echocardiogram examinations, most                  recent 04/23/2020. Risk Factors:Hypertension and Diabetes.  Sonographer:     Raquel Sarna Senior RDCS Referring Phys:  [id number] Greer Ee [place] Diagnosing Phys:  Gwyndolyn Kaufman MD PROCEDURE: After discussion of the risks and benefits of a TEE, an informed consent was obtained from the patient. The transesophogeal probe was passed without difficulty through the esophogus of the patient. Local oropharyngeal anesthetic was provided with Cetacaine. Sedation performed by different physician. The patient was monitored while under deep sedation. Anesthestetic sedation was provided intravenously by Anesthesiology: 69m of Propofol. The patient developed no complications during the procedure. IMPRESSIONS  1. Left ventricular ejection fraction, by estimation, is 20 to 25%. The left ventricle has severely decreased function. The left ventricle demonstrates global hypokinesis.  2. Right ventricular systolic function is moderately reduced. The right ventricular size is normal.  3. Left atrial size was moderately dilated. No left atrial/left atrial appendage thrombus was detected. The LAA emptying velocity was 30 cm/s.  4. Right atrial size was mildly dilated.  5. The mitral valve is degenerative. Mild to moderate mitral valve regurgitation.  6. The aortic valve is tricuspid. There is mild calcification of the aortic valve. There is mild thickening of the aortic valve. Aortic valve regurgitation is trivial.  7. 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  8. A small PFO was detected by color flow Doppler with predominant L-->R shunting, however, there is flow reversal (R-->L) with valsalva.  9. Agitated saline contrast bubble study was positive with shunting observed within 3-6 cardiac cycles with valsalva suggestive of interatrial shunt. FINDINGS  Left Ventricle: Left ventricular ejection fraction, by estimation, is 20 to 25%. The left ventricle has severely decreased function. The left ventricle demonstrates global hypokinesis. The left ventricular internal cavity size was normal in size. Right Ventricle: The right  ventricular size is normal. No increase in right ventricular wall thickness. Right ventricular systolic function is moderately reduced. Left Atrium: Left atrial size was moderately dilated. No left atrial/left atrial appendage thrombus was detected. The LAA emptying velocity was 30 cm/s. Right Atrium: Right atrial size was mildly dilated. Pericardium: There is no evidence of pericardial effusion. Mitral Valve: The mitral valve is degenerative in appearance. There is mild thickening of the mitral valve leaflet(s). There is mild calcification of the mitral valve leaflet(s). Mild to moderate mitral valve regurgitation. Tricuspid Valve: The tricuspid valve is normal in structure. Tricuspid valve regurgitation is mild. Aortic Valve: The aortic valve is tricuspid. There is mild calcification of the aortic valve. There is mild thickening of the aortic valve. Aortic valve regurgitation is trivial. The is small fibrinous stranding visualized on the  aortic valve leaflet consistent with a Lambl's excresence. Pulmonic Valve: The pulmonic valve was normal in structure. Pulmonic valve regurgitation is trivial. Aorta: The aortic root and ascending aorta are structurally normal, with no evidence of dilitation. IAS/Shunts: Evidence of atrial level shunting detected by color flow Doppler. Agitated saline contrast was given intravenously to evaluate for intracardiac shunting. Agitated saline contrast bubble study was positive with shunting observed within 3-6 cardiac cycles suggestive of interatrial shunt. A small patent foramen ovale is detected. Gwyndolyn Kaufman MD Electronically signed by Gwyndolyn Kaufman MD Signature Date/Time: 04/24/2020/9:38:38 AM    Final    VAS [place] LOWER EXTREMITY VENOUS (DVT)  Result Date: 04/23/2020  Lower Venous DVT Study Indications: SOB.  Risk Factors: None identified. Anticoagulation: Lovenox. Limitations: Patient movement. Comparison Study: No previous exams Performing Technologist: Vonzell Schlatter RVT   Examination Guidelines: A complete evaluation includes B-mode imaging, spectral Doppler, color Doppler, and power Doppler as needed of all accessible portions of each vessel. Bilateral testing is considered an integral part of a complete examination. Limited examinations for reoccurring indications may be performed as noted. The reflux portion of the exam is performed with the patient in reverse Trendelenburg.  +---------+---------------+---------+-----------+----------+--------------+ RIGHT    CompressibilityPhasicitySpontaneityPropertiesThrombus Aging +---------+---------------+---------+-----------+----------+--------------+ CFV      Full           Yes      Yes                                 +---------+---------------+---------+-----------+----------+--------------+ SFJ      Full                                                        +---------+---------------+---------+-----------+----------+--------------+ FV Prox  Full                                                        +---------+---------------+---------+-----------+----------+--------------+ FV Mid   Full                                                        +---------+---------------+---------+-----------+----------+--------------+ FV DistalFull                                                        +---------+---------------+---------+-----------+----------+--------------+ PFV      Full                                                        +---------+---------------+---------+-----------+----------+--------------+ POP      Full           Yes      Yes                                 +---------+---------------+---------+-----------+----------+--------------+  PTV      Full                                                        +---------+---------------+---------+-----------+----------+--------------+ PERO     Full                                                         +---------+---------------+---------+-----------+----------+--------------+   +---------+---------------+---------+-----------+----------+--------------+ LEFT     CompressibilityPhasicitySpontaneityPropertiesThrombus Aging +---------+---------------+---------+-----------+----------+--------------+ CFV      Full           Yes      Yes                                 +---------+---------------+---------+-----------+----------+--------------+ SFJ      Full                                                        +---------+---------------+---------+-----------+----------+--------------+ FV Prox  Full                                                        +---------+---------------+---------+-----------+----------+--------------+ FV Mid   Full                                                        +---------+---------------+---------+-----------+----------+--------------+ FV DistalFull                                                        +---------+---------------+---------+-----------+----------+--------------+ PFV      Full                                                        +---------+---------------+---------+-----------+----------+--------------+ POP      Full           Yes      Yes                                 +---------+---------------+---------+-----------+----------+--------------+ PTV      Full                                                        +---------+---------------+---------+-----------+----------+--------------+  PERO     Full                                                        +---------+---------------+---------+-----------+----------+--------------+  Summary: BILATERAL: - No evidence of deep vein thrombosis seen in the lower extremities, bilaterally. -No evidence of popliteal cyst, bilaterally.   *See table(s) above for measurements and observations. Electronically signed by Deitra Mayo MD on 04/23/2020 at  11:27:04 AM.    Final         Scheduled Meds: . aspirin EC  81 mg Oral Daily  . buPROPion  150 mg Oral Daily  . clopidogrel  75 mg Oral Daily  . DULoxetine  60 mg Oral Daily  . enoxaparin (LOVENOX) injection  40 mg Subcutaneous Q24H  . folic acid  2 mg Oral Daily  . insulin aspart  0-5 Units Subcutaneous QHS  . insulin aspart  0-9 Units Subcutaneous TID WC  . irbesartan  75 mg Oral Daily  . levothyroxine  75 mcg Oral QAC breakfast  . metoprolol succinate  12.5 mg Oral Daily  . potassium chloride  20 mEq Oral Once  . predniSONE  5 mg Oral Daily   Continuous Infusions: . magnesium sulfate bolus IVPB       LOS: 2 days    Time spent: 38 minutes spent on chart review, discussion with nursing staff, consultants, updating family and interview/physical exam; more than 50% of that time was spent in counseling and/or coordination of care.    Leatta Alewine J [place] DO [facility] Available via Epic secure chat 7am-7pm After these hours, please refer to coverage provider listed on [email or web address] 04/24/2020, 12:35 PM

## 2020-04-24 NOTE — Progress Notes (Deleted)
[facility]  [address] [place],  [place]  [address] [phone]  Clinic Day:  04/24/2020  Referring physician: Heywood Bene, MD   HISTORY OF PRESENT ILLNESS:  The patient is a 60 y.o. female with recurrent iron deficiency anemia.  She comes in to reassess her iron and hemoglobin levels after recently receiving IV Feraheme  Since receiving her IV iron, the patient has felt much better.  She continues to deny having any overt forms of blood loss.   PHYSICAL EXAM:  There were no vitals taken for this visit. Wt Readings from Last 3 Encounters:  04/23/20 128 lb 12 oz (58.4 kg)  03/13/20 123 lb 3.2 oz (55.9 kg)  02/12/20 125 lb (56.7 kg)   There is no height or weight on file to calculate BMI. Performance status (ECOG): {CHL ONC [id number] Physical Exam Constitutional:      Appearance: Normal appearance. She is not ill-appearing.  HENT:     Mouth/Throat:     Mouth: Mucous membranes are moist.     Pharynx: Oropharynx is clear. No oropharyngeal exudate or posterior oropharyngeal erythema.  Cardiovascular:     Rate and Rhythm: Normal rate and regular rhythm.     Heart sounds: No murmur heard. No friction rub. No gallop.   Pulmonary:     Effort: Pulmonary effort is normal. No respiratory distress.     Breath sounds: Normal breath sounds. No wheezing, rhonchi or rales.  Chest:  Breasts:     Right: No axillary adenopathy or supraclavicular adenopathy.     Left: No axillary adenopathy or supraclavicular adenopathy.    Abdominal:     General: Bowel sounds are normal. There is no distension.     Palpations: Abdomen is soft. There is no mass.     Tenderness: There is no abdominal tenderness.  Musculoskeletal:        General: No swelling.     Right lower leg: No edema.     Left lower leg: No edema.  Lymphadenopathy:     Cervical: No cervical adenopathy.     Upper Body:     Right upper body: No supraclavicular or axillary adenopathy.      Left upper body: No supraclavicular or axillary adenopathy.     Lower Body: No right inguinal adenopathy. No left inguinal adenopathy.  Skin:    General: Skin is warm.     Coloration: Skin is not jaundiced.     Findings: No lesion or rash.  Neurological:     General: No focal deficit present.     Mental Status: She is alert and oriented to person, place, and time. Mental status is at baseline.     Cranial Nerves: Cranial nerves are intact.  Psychiatric:        Mood and Affect: Mood normal.        Behavior: Behavior normal.        Thought Content: Thought content normal.     LABS:   CBC Latest Ref Rng & Units 04/24/2020 04/23/2020 04/22/2020  WBC 4.0 - 10.5 K/uL 12.5(H) 13.0(H) 13.2(H)  Hemoglobin 12.0 - 15.0 g/dL 91.4 78.2 95.6  Hematocrit 36.0 - 46.0 % 42.5 47.7(H) 49.0(H)  Platelets 150 - 400 K/uL 292 319 342   CMP Latest Ref Rng & Units 04/24/2020 04/23/2020 04/22/2020  Glucose 70 - 99 mg/dL 213(Y) 865(H) 846(N)  BUN 6 - 20 mg/dL Creatinine 0.44 - 1.00 mg/dL 6.29 5.28 4.13  Sodium 135 -  145 mmol/L 138 136 137  Potassium 3.5 - 5.1 mmol/L 3.8 3.5 3.7  Chloride 98 - 111 mmol/L 101 99 98  CO2 22 - 32 mmol/L 24 24 28   Calcium 8.9 - 10.3 mg/dL 8.3(L) 8.0(L) 8.6(L)  Total Protein 6.5 - 8.1 g/dL 5.1(L) 5.3(L) 5.3(L)  Total Bilirubin 0.3 - 1.2 mg/dL 1.1 1.1 0.9  Alkaline Phos 38 - 126 U/L 312(H) 218(H) 162(H)  AST 15 - 41 U/L 569(H) 203(H) 65(H)  ALT 0 - 44 U/L 313(H) 121(H) 62(H)      Lab Results  Component Value Date   TIBC 308 01/24/2020   FERRITIN 32.5 01/24/2020   IRONPCTSAT 11.3 01/24/2020   No results found for: LDH  No results found for: AFPTUMOR, TOTALPROTELP, ALBUMINELP, A1GS, A2GS, BETS, BETA2SER, GAMS, MSPIKE, SPEI, LDH, CEA1, PSA1, IGASERUM, IGGSERUM, IGMSERUM, THGAB, THYROGLB  Recent Review Flowsheet Data    Oncology Labs 01/24/2020   FERRITIN 32.5   IRONPCTSAT 11.3       STUDIES:  CT Code Stroke CTA Head W/WO contrast  Result Date:  04/22/2020 CLINICAL DATA:  Worsening left-sided weakness for 2 weeks. Recent TIA. Fall today. Slurred speech. EXAM: CT ANGIOGRAPHY HEAD AND NECK TECHNIQUE: Multidetector CT imaging of the head and neck was performed using the standard protocol during bolus administration of intravenous contrast. Multiplanar CT image reconstructions and MIPs were obtained to evaluate the vascular anatomy. Carotid stenosis measurements (when applicable) are obtained utilizing NASCET criteria, using the distal internal carotid diameter as the denominator. CONTRAST:  9mL OMNIPAQUE IOHEXOL 350 MG/ML SOLN COMPARISON:  Brain MRI 04/22/2020 Head CT 04/22/2020 FINDINGS: CTA NECK FINDINGS SKELETON: There is no bony spinal canal stenosis. No lytic or blastic lesion. OTHER NECK: Normal pharynx, larynx and major salivary glands. No cervical lymphadenopathy. Enlarged thyroid gland. UPPER CHEST: Small left pleural effusion with fluid in the fissure. AORTIC ARCH: There is no calcific atherosclerosis of the aortic arch. There is no aneurysm, dissection or hemodynamically significant stenosis of the visualized portion of the aorta. Conventional 3 vessel aortic branching pattern. The visualized proximal subclavian arteries are widely patent. RIGHT CAROTID SYSTEM: Normal without aneurysm, dissection or stenosis. LEFT CAROTID SYSTEM: No dissection, occlusion or aneurysm. Mild atherosclerotic calcification at the carotid bifurcation without hemodynamically significant stenosis. VERTEBRAL ARTERIES: Codominant configuration. Both origins are clearly patent. There is no dissection, occlusion or flow-limiting stenosis to the skull base (V1-V3 segments). CTA HEAD FINDINGS POSTERIOR CIRCULATION: --Vertebral arteries: Normal V4 segments. --Inferior cerebellar arteries: Normal. --Basilar artery: Normal. --Superior cerebellar arteries: Normal. --Posterior cerebral arteries (PCA): Severe stenosis of the right PCA P3 segment over a length 7 mm (12:17). The distal  right PCA is normal. Mild stenosis of the left P3 segment in a similar location (12:24). ANTERIOR CIRCULATION: --Intracranial internal carotid arteries: Normal. --Anterior cerebral arteries (ACA): Normal. Hypoplastic right A1 segment, normal variant. --Middle cerebral arteries (MCA): Normal. VENOUS SINUSES: As permitted by contrast timing, patent. Incidentally noted gas in the cavernous sinuses related to contrast injection. ANATOMIC VARIANTS: None Review of the MIP images confirms the above findings. IMPRESSION: 1. No emergent large vessel occlusion. 2. 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 3. Mild stenosis of the left PCA P3 segment. 4. Small left pleural effusion with fluid in the fissure. 5. Enlarged thyroid gland. This has been evaluated on previous imaging. (ref: J Am Coll Radiol. 2015 Feb;12(2): 143-50). Electronically Signed   By: 04/24/2020 M.D.   On: 04/22/2020 20:14   CT Head Wo  Contrast  Result Date: 04/22/2020 CLINICAL DATA:  Tube deficit. Progressive weakness, greater on the left. Fell today. EXAM: CT HEAD WITHOUT CONTRAST TECHNIQUE: Contiguous axial images were obtained from the base of the skull through the vertex without intravenous contrast. COMPARISON:  02/16/2020 FINDINGS: Brain: No evidence of acute infarction, hemorrhage, hydrocephalus, extra-axial collection or mass lesion/mass effect. Subcortical and periventricular white matter hypodensity most likely result of chronic ischemic small vessel disease. Similar findings can be seen with demyelinating disease of the white matter. Diffuse cortical atrophy again seen. Vascular: No hyperdense vessel or unexpected calcification. Skull: Normal. Negative for fracture or focal lesion. Sinuses/Orbits: No acute finding. Other: None. IMPRESSION: No acute intracranial abnormality. Electronically Signed   By: Acquanetta Belling M.D.   On: 04/22/2020 14:29   CT Code Stroke CTA Neck W/WO contrast  Result Date:  04/22/2020 CLINICAL DATA:  Worsening left-sided weakness for 2 weeks. Recent TIA. Fall today. Slurred speech. EXAM: CT ANGIOGRAPHY HEAD AND NECK TECHNIQUE: Multidetector CT imaging of the head and neck was performed using the standard protocol during bolus administration of intravenous contrast. Multiplanar CT image reconstructions and MIPs were obtained to evaluate the vascular anatomy. Carotid stenosis measurements (when applicable) are obtained utilizing NASCET criteria, using the distal internal carotid diameter as the denominator. CONTRAST:  75mL OMNIPAQUE IOHEXOL 350 MG/ML SOLN COMPARISON:  Brain MRI 04/22/2020 Head CT 04/22/2020 FINDINGS: CTA NECK FINDINGS SKELETON: There is no bony spinal canal stenosis. No lytic or blastic lesion. OTHER NECK: Normal pharynx, larynx and major salivary glands. No cervical lymphadenopathy. Enlarged thyroid gland. UPPER CHEST: Small left pleural effusion with fluid in the fissure. AORTIC ARCH: There is no calcific atherosclerosis of the aortic arch. There is no aneurysm, dissection or hemodynamically significant stenosis of the visualized portion of the aorta. Conventional 3 vessel aortic branching pattern. The visualized proximal subclavian arteries are widely patent. RIGHT CAROTID SYSTEM: Normal without aneurysm, dissection or stenosis. LEFT CAROTID SYSTEM: No dissection, occlusion or aneurysm. Mild atherosclerotic calcification at the carotid bifurcation without hemodynamically significant stenosis. VERTEBRAL ARTERIES: Codominant configuration. Both origins are clearly patent. There is no dissection, occlusion or flow-limiting stenosis to the skull base (V1-V3 segments). CTA HEAD FINDINGS POSTERIOR CIRCULATION: --Vertebral arteries: Normal V4 segments. --Inferior cerebellar arteries: Normal. --Basilar artery: Normal. --Superior cerebellar arteries: Normal. --Posterior cerebral arteries (PCA): Severe stenosis of the right PCA P3 segment over a length 7 mm (12:17). The distal  right PCA is normal. Mild stenosis of the left P3 segment in a similar location (12:24). ANTERIOR CIRCULATION: --Intracranial internal carotid arteries: Normal. --Anterior cerebral arteries (ACA): Normal. Hypoplastic right A1 segment, normal variant. --Middle cerebral arteries (MCA): Normal. VENOUS SINUSES: As permitted by contrast timing, patent. Incidentally noted gas in the cavernous sinuses related to contrast injection. ANATOMIC VARIANTS: None Review of the MIP images confirms the above findings. IMPRESSION: 1. No emergent large vessel occlusion. 2. 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 3. Mild stenosis of the left PCA P3 segment. 4. Small left pleural effusion with fluid in the fissure. 5. Enlarged thyroid gland. This has been evaluated on previous imaging. (ref: J Am Coll Radiol. 2015 Feb;12(2): 143-50). Electronically Signed   By: Deatra Robinson M.D.   On: 04/22/2020 20:14   MR BRAIN WO CONTRAST  Result Date: 04/22/2020 CLINICAL DATA:  Acute neurological deficit. Progressive weakness, worse on the left. EXAM: MRI HEAD WITHOUT CONTRAST TECHNIQUE: Multiplanar, multiecho pulse sequences of the brain and surrounding structures were obtained without intravenous contrast. COMPARISON:  Head CT same day.  MRI 02/28/2020. FINDINGS: Brain: Diffusion imaging shows acute infarction affecting the right mid-posterior caudate an the superior thalamus. Single separate punctate acute infarction in the cortex of the medial right parietooccipital junction. Few small punctate acute infarctions in the right occipital cortex. 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 Elsewhere, there is chronic small vessel ischemic change affecting the hemispheric deep and subcortical white matter, right more than left. No focal insult affects the brainstem or cerebellum. No evidence of mass, hemorrhage, hydrocephalus or extra-axial collection Vascular: Major vessels at the base of the brain  show flow. Skull and upper cervical spine: Negative Sinuses/Orbits: Clear/normal Other: None IMPRESSION: 1. Acute infarction affecting the right mid-posterior caudate and right superior thalamus. Single separate punctate acute infarction in the cortex of the medial right parietooccipital junction. Few small punctate acute infarctions in the right occipital cortex. 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 No evidence of hemorrhage or mass effect. 2. Chronic small-vessel ischemic changes elsewhere throughout the brain as outlined above. Electronically Signed   By: Paulina Fusi M.D.   On: 04/22/2020 17:12   MR CERVICAL SPINE WO CONTRAST  Result Date: 04/23/2020 CLINICAL DATA:  Myelopathy, acute or progressive. Additional history provided: Patient reports worsening weakness on the left side, most notably within the last week, 3 falls in the last 2 days weakness all over, but the left-sided seems weaker than the right. EXAM: MRI CERVICAL SPINE WITHOUT CONTRAST TECHNIQUE: Multiplanar, multisequence MR imaging of the cervical spine was performed. No intravenous contrast was administered. COMPARISON:  CT head/neck 04/22/2020.  CT cervical spine 01/26/2015. FINDINGS: All sequences are moderate to severely motion degraded, significantly limiting evaluation. Alignment: Straightening of the expected cervical lordosis. No significant spondylolisthesis. Vertebrae: No significant marrow edema or focal suspicious osseous Lea is identified. Cord: Motion degradation precludes adequate evaluation for spinal cord signal abnormality. Posterior Fossa, vertebral arteries, paraspinal tissues: Cerebellar atrophy. No acute abnormality identified within included portions of the posterior fossa. Cervical vertebral artery flow voids poorly assessed due to motion degradation. No paraspinal soft tissue abnormality is identified. Disc levels: Multilevel disc degeneration. Most notably, there is moderate disc degeneration  at the C4-C5, C5-C6 and C6-C7 levels. C2-C3: Disc bulge. Facet hypertrophy. No significant spinal canal stenosis. Mild bilateral neural foraminal narrowing. C3-C4: Disc bulge. Bilateral disc osteophyte ridge/uncinate hypertrophy (greater on the right). Facet and ligamentum flavum hypertrophy. Apparent moderate spinal canal stenosis. Bilateral neural foraminal narrowing (severe right, mild left). C4-C5: Posterior disc osteophyte complex. Uncovertebral, facet and ligamentum flavum hypertrophy. Apparent moderate/severe spinal canal stenosis. Bilateral neural foraminal narrowing (moderate/severe right, moderate left). C5-C6: Posterior disc osteophyte complex. Uncovertebral, facet and ligamentum flavum hypertrophy. Apparent moderate/severe spinal canal stenosis. Bilateral neural foraminal narrowing (moderate right, moderate/severe left). C6-C7: Posterior disc osteophyte complex. Uncovertebral, facet and ligamentum flavum hypertrophy. Apparent moderate spinal canal stenosis. Bilateral neural foraminal narrowing (moderate/severe right, moderate left). C7-T1: Mild endplate spurring on the left. Mild facet hypertrophy (greater on the left). No appreciable disc herniation or spinal canal stenosis. Mild left neural foraminal narrowing. IMPRESSION: All acquired sequences are moderate to severely motion degraded, significantly limiting evaluation. A repeat examination should be considered when the patient is better able to tolerate the study. Cervical spondylosis as outlined. Most notably, there is apparent moderate/severe spinal canal stenosis at C4-C5 and C5-C6, and apparent moderate spinal canal stenosis at C3-C4 and C6-C7. Motion degradation limits evaluation for subtle spinal cord mass effect and precludes adequate evaluation for spinal cord signal abnormality. Multilevel neural foraminal narrowing  as detailed and greatest on the right at C3-C4 (severe), on the right at C4-C5 (moderate/severe), on the left at C5-C6  (moderate/severe) and on the right at C6-C7 (moderate/severe). Electronically Signed   By: Jackey Loge DO   On: 04/23/2020 12:27   CT CORONARY MORPH W/CTA COR W/SCORE W/CA W/CM &/OR WO/CM  Addendum Date: 04/12/2020   ADDENDUM REPORT: 04/12/2020 13:02 CLINICAL DATA:  60 year old female with chest pain and depressed ejection fraction. EXAM: Cardiac/Coronary  CT TECHNIQUE: The patient was scanned on a [employer]. FINDINGS: A 120 kV retrospective scan was triggered in the descending thoracic aorta at 111 HU's. Axial non-contrast 3 mm slices were carried out through the heart. The data set was analyzed on a dedicated work station and scored using the Agatson method. Gantry rotation speed was 250 msecs and collimation was .6 mm. No beta blockade and 0.8 mg of sl NTG was given. The 3D data set was reconstructed in 5% intervals of the 67-82 % of the R-R cycle. Diastolic phases were analyzed on a dedicated work station using MPR, MIP and VRT modes. The patient received 80 cc of contrast. Aorta: Normal size.  No calcifications.  No dissection. Aortic Valve:  Trileaflet.  No calcifications. Coronary calcium score: 1012 Coronary Arteries:  Normal coronary origin.  Right dominance. RCA is a large dominant artery that gives rise to PDA and PLVB. There is a Proximal mild (25-49%) calcified plaque. The mid RDA with a soft plaque with high risk appearance (napkin ring). The mid to distal RCA with diffuse mild mixed lesions. The distal RCA with a severe (>70) soft plaque. Left main is a large artery that gives rise to LAD, Ramus Intermedius and LCX arteries. LAD is a large vessel. The proximal LAD with no plaque. The mid LAD with a moderate (50-69%) mixed lesion that as it terminates appears to look >70%. Ramus is a medium sized vessel. There is no plaques in the proximal portion. The mid Ramus with moderate (50-69%) lesion. LCX is a non-dominant artery that gives rise to one large OM1 branch. There is proximal to  mid segmental moderate lesions. Other findings: Normal pulmonary vein drainage into the left atrium. Normal left atrial appendage without a thrombus. Normal size of the pulmonary artery. IMPRESSION: 1. Coronary calcium score of 1012. This was 10 percentile for age and sex matched control. 2. Normal coronary origin with right dominance. 3. Multivessel CAD. CADRADS 4A. This study will be send for FFRct. Consider cardiac catheterization. Thomasene Ripple, DO Electronically Signed   By: Thomasene Ripple DO   On: 04/12/2020 13:02   Result Date: 04/12/2020 EXAM: OVER-READ INTERPRETATION CT CHEST The following report is an over-read performed by radiologist Dr. Hulan Saas of [facility] on 04/11/2020. This over-read does not include interpretation of cardiac or coronary anatomy or pathology. The coronary calcium score/coronary CTA interpretation by the cardiologist is attached. COMPARISON:  None. FINDINGS: Vascular: Moderate atherosclerosis involving the descending thoracic aorta with calcified and noncalcified plaque. Reflux of contrast from the RIGHT atrium into the IVC and hepatic veins. Mediastinum/Nodes: Scarring in the anterior superior mediastinum at the site of prior mediastinal mass excision. No pathologic lymphadenopathy within the visualized mediastinum. Visualized esophagus normal in appearance. Lungs/Pleura: Mild diffuse interstitial opacities throughout both lungs likely indicating mild pulmonary edema. Chronic scarring in the lower lobes. Small air cyst in the superior segment RIGHT LOWER LOBE, unchanged. Small to moderate-sized BILATERAL pleural effusions, including fluid in the major fissure on the LEFT.  Upper Abdomen: Unremarkable for the early arterial phase of enhancement. Musculoskeletal: Severe degenerative disc disease and spondylosis involving the visualized mid and lower thoracic spine. IMPRESSION: 1. Mild diffuse interstitial opacities throughout both lungs, likely indicating mild  pulmonary edema. 2. Small to moderate-sized BILATERAL pleural effusions, including fluid in the major fissure on the LEFT. 3. Reflux of contrast from the RIGHT atrium into the IVC and hepatic veins, likely indicating elevated RIGHT heart pressures and/or tricuspid regurgitation. 4. 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 Aortic Atherosclerosis (ICD10-I70.0). Electronically Signed: By: Hulan Saas M.D. On: 04/11/2020 15:42   DG Chest Port 1 View  Result Date: 04/22/2020 CLINICAL DATA:  Weakness EXAM: PORTABLE CHEST 1 VIEW COMPARISON:  Portable exam 1345 hours compared to 12/21/2012 FINDINGS: Upper normal heart size post median sternotomy. Mediastinal contours and pulmonary vascularity normal. Subsegmental atelectasis versus scarring at LEFT base. Remaining lungs clear. No acute infiltrate, pleural effusion, or pneumothorax. Bones demineralized with note of a LEFT shoulder prosthesis and advanced RIGHT glenohumeral degenerative changes. IMPRESSION: Atelectasis versus scarring at LEFT base. Electronically Signed   By: Ulyses Southward M.D.   On: 04/22/2020 14:01   CT CORONARY FRACTIONAL FLOW RESERVE DATA PREP  Result Date: 04/12/2020 EXAM: FFRCT ANALYSIS FINDINGS: FFRct analysis was performed on the original cardiac CT angiogram dataset. Diagrammatic representation of the FFRct analysis is provided in a separate PDF document in PACS. This dictation was created using the PDF document and an interactive 3D model of the results. 3D model is not available in the EMR/PACS. Normal FFR range is >0.80. 1. Left Main: 0.99 2. PVV:ZSMOLMBE 0.96, Mid 0.54, Distal <0.50 3. LCX: This vessel is not well analyzed. 4. Ramus: Proximal 0.98, Mid 0.82, Distal 0.70 1. 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 Recommend Cardiac catheterization. Note: These examples are not  recommendations of [employer] and only provided as examples of what other customers are doing. Electronically Signed   By: Thomasene Ripple DO   On: 04/12/2020 13:06   ECHOCARDIOGRAM COMPLETE  Result Date: 04/23/2020    ECHOCARDIOGRAM REPORT   Patient Name:   Madison Hurst Gonzella Lex Date of Exam: 04/23/2020 Medical Rec #:  [id number]                     Height:       60.0 in Accession #:    [id number]                    Weight:       123.0 lb Date of Birth:  [birth year]                    BSA:          1.518 m Patient Age:    59 years                      BP:           115/102 mmHg Patient Gender: F                             HR:           107 bpm. Exam Location:  Inpatient Procedure: 2D Echo and Color Doppler Indications:    Stroke 434.91 / I163.9  History:  Patient has prior history of Echocardiogram examinations, most                 recent 03/11/2020. Signs/Symptoms:Dyspnea; Risk                 Factors:Diabetes. Hypothyroidism.  Sonographer:    Tiffany Dance Referring Phys: [id number] RONDELL A SMITH IMPRESSIONS  1. Left ventricular ejection fraction, by estimation, is 20 to 25%. The left ventricle has severely decreased function. The left ventricle demonstrates global hypokinesis. Left ventricular diastolic parameters are indeterminate.  2. Right ventricular systolic function is normal. The right ventricular size is normal.  3. Left atrial size was moderately dilated.  4. The mitral valve is normal in structure. Mild mitral valve regurgitation. No evidence of mitral stenosis.  5. The aortic valve is tricuspid. Aortic valve regurgitation is not visualized. No aortic stenosis is present.  6. The inferior vena cava is normal in size with greater than 50% respiratory variability, suggesting right atrial pressure of 3 mmHg. FINDINGS  Left Ventricle: Left ventricular ejection fraction, by estimation, is 20 to 25%. The left ventricle has severely decreased function. The left ventricle demonstrates global  hypokinesis. The left ventricular internal cavity size was normal in size. There is no left ventricular hypertrophy. Left ventricular diastolic parameters are indeterminate. Right Ventricle: The right ventricular size is normal. Right ventricular systolic function is normal. Left Atrium: Left atrial size was moderately dilated. Right Atrium: Right atrial size was normal in size. Pericardium: There is no evidence of pericardial effusion. Mitral Valve: The mitral valve is normal in structure. Mild mitral annular calcification. Mild mitral valve regurgitation. No evidence of mitral valve stenosis. Tricuspid Valve: The tricuspid valve is normal in structure. Tricuspid valve regurgitation is trivial. No evidence of tricuspid stenosis. Aortic Valve: The aortic valve is tricuspid. Aortic valve regurgitation is not visualized. No aortic stenosis is present. Pulmonic Valve: The pulmonic valve was grossly normal. Pulmonic valve regurgitation is not visualized. No evidence of pulmonic stenosis. Aorta: The aortic root is normal in size and structure. Venous: The inferior vena cava is normal in size with greater than 50% respiratory variability, suggesting right atrial pressure of 3 mmHg. IAS/Shunts: No atrial level shunt detected by color flow Doppler.  LEFT VENTRICLE PLAX 2D LVIDd:         4.70 cm LVIDs:         4.00 cm LV PW:         1.20 cm LV IVS:        0.70 cm LVOT diam:     1.90 cm LV SV:         19 LV SV Index:   13 LVOT Area:     2.84 cm  RIGHT VENTRICLE            IVC RV Basal diam:  2.20 cm    IVC diam: 2.10 cm RV S prime:     6.42 cm/s TAPSE (M-mode): 1.2 cm LEFT ATRIUM             Index       RIGHT ATRIUM           Index LA diam:        5.10 cm 3.36 cm/m  RA Area:     12.90 cm LA Vol (A2C):   68.6 ml 45.19 ml/m RA Volume:   27.30 ml  17.98 ml/m LA Vol (A4C):   64.6 ml 42.55 ml/m LA Biplane Vol: 67.3 ml 44.33 ml/m  AORTIC VALVE LVOT  Vmax:   48.75 cm/s LVOT Vmean:  30.800 cm/s LVOT VTI:    0.068 m  AORTA Ao  Root diam: 2.70 cm Ao Asc diam:  3.40 cm MITRAL VALVE MV Area (PHT): 5.13 cm    SHUNTS MV Decel Time: 148 msec    Systemic VTI:  0.07 m MV E velocity: 92.40 cm/s  Systemic Diam: 1.90 cm MV A velocity: 71.10 cm/s MV E/A ratio:  1.30 Olga Millers MD Electronically signed by Olga Millers MD Signature Date/Time: 04/23/2020/2:25:22 PM    Final    ECHO TEE  Result Date: 04/24/2020    TRANSESOPHOGEAL ECHO REPORT   Patient Name:   Madison Hurst Date of Exam: 04/24/2020 Medical Rec #:  [id number]                     Height:       60.0 in Accession #:    [id number]                    Weight:       128.7 lb Date of Birth:  1960-06-13                    BSA:          1.548 m Patient Age:    59 years                      BP:           135/98 mmHg Patient Gender: F                             HR:           90 bpm. Exam Location:  Inpatient Procedure: Transesophageal Echo, 3D Echo, Color Doppler and Cardiac Doppler Indications:     Stroke i163.9  History:         Patient has prior history of Echocardiogram examinations, most                  recent 04/23/2020. Risk Factors:Hypertension and Diabetes.  Sonographer:     Irving Burton Senior RDCS Referring Phys:  [id number] Kathlynn Grate [place] Diagnosing Phys: Laurance Flatten MD PROCEDURE: After discussion of the risks and benefits of a TEE, an informed consent was obtained from the patient. The transesophogeal probe was passed without difficulty through the esophogus of the patient. Local oropharyngeal anesthetic was provided with Cetacaine. Sedation performed by different physician. The patient was monitored while under deep sedation. Anesthestetic sedation was provided intravenously by Anesthesiology: 66mg  of Propofol. The patient developed no complications during the procedure. IMPRESSIONS  1. Left ventricular ejection fraction, by estimation, is 20 to 25%. The left ventricle has severely decreased function. The left ventricle demonstrates global hypokinesis.  2.  Right ventricular systolic function is moderately reduced. The right ventricular size is normal.  3. Left atrial size was moderately dilated. No left atrial/left atrial appendage thrombus was detected. The LAA emptying velocity was 30 cm/s.  4. Right atrial size was mildly dilated.  5. The mitral valve is degenerative. Mild to moderate mitral valve regurgitation.  6. The aortic valve is tricuspid. There is mild calcification of the aortic valve. There is mild thickening of the aortic valve. Aortic valve regurgitation is trivial.  7. 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  8. A small  PFO was detected by color flow Doppler with predominant L-->R shunting, however, there is flow reversal (R-->L) with valsalva.  9. Agitated saline contrast bubble study was positive with shunting observed within 3-6 cardiac cycles with valsalva suggestive of interatrial shunt. FINDINGS  Left Ventricle: Left ventricular ejection fraction, by estimation, is 20 to 25%. The left ventricle has severely decreased function. The left ventricle demonstrates global hypokinesis. The left ventricular internal cavity size was normal in size. Right Ventricle: The right ventricular size is normal. No increase in right ventricular wall thickness. Right ventricular systolic function is moderately reduced. Left Atrium: Left atrial size was moderately dilated. No left atrial/left atrial appendage thrombus was detected. The LAA emptying velocity was 30 cm/s. Right Atrium: Right atrial size was mildly dilated. Pericardium: There is no evidence of pericardial effusion. Mitral Valve: The mitral valve is degenerative in appearance. There is mild thickening of the mitral valve leaflet(s). There is mild calcification of the mitral valve leaflet(s). Mild to moderate mitral valve regurgitation. Tricuspid Valve: The tricuspid valve is normal in structure. Tricuspid valve regurgitation is  mild. Aortic Valve: The aortic valve is tricuspid. There is mild calcification of the aortic valve. There is mild thickening of the aortic valve. Aortic valve regurgitation is trivial. 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 Pulmonic Valve: The pulmonic valve was normal in structure. Pulmonic valve regurgitation is trivial. Aorta: The aortic root and ascending aorta are structurally normal, with no evidence of dilitation. IAS/Shunts: Evidence of atrial level shunting detected by color flow Doppler. Agitated saline contrast was given intravenously to evaluate for intracardiac shunting. Agitated saline contrast bubble study was positive with shunting observed within 3-6 cardiac cycles suggestive of interatrial shunt. A small patent foramen ovale is detected. Laurance Flatten MD Electronically signed by Laurance Flatten MD Signature Date/Time: 04/24/2020/9:38:38 AM    Final    VAS [place] LOWER EXTREMITY VENOUS (DVT)  Result Date: 04/23/2020  Lower Venous DVT Study Indications: SOB.  Risk Factors: None identified. Anticoagulation: Lovenox. Limitations: Patient movement. Comparison Study: No previous exams Performing Technologist: Clint Guy RVT  Examination Guidelines: A complete evaluation includes B-mode imaging, spectral Doppler, color Doppler, and power Doppler as needed of all accessible portions of each vessel. Bilateral testing is considered an integral part of a complete examination. Limited examinations for reoccurring indications may be performed as noted. The reflux portion of the exam is performed with the patient in reverse Trendelenburg.  +---------+---------------+---------+-----------+----------+--------------+ RIGHT    CompressibilityPhasicitySpontaneityPropertiesThrombus Aging +---------+---------------+---------+-----------+----------+--------------+ CFV      Full           Yes      Yes                                  +---------+---------------+---------+-----------+----------+--------------+ SFJ      Full                                                        +---------+---------------+---------+-----------+----------+--------------+ FV Prox  Full                                                        +---------+---------------+---------+-----------+----------+--------------+  FV Mid   Full                                                        +---------+---------------+---------+-----------+----------+--------------+ FV DistalFull                                                        +---------+---------------+---------+-----------+----------+--------------+ PFV      Full                                                        +---------+---------------+---------+-----------+----------+--------------+ POP      Full           Yes      Yes                                 +---------+---------------+---------+-----------+----------+--------------+ PTV      Full                                                        +---------+---------------+---------+-----------+----------+--------------+ PERO     Full                                                        +---------+---------------+---------+-----------+----------+--------------+   +---------+---------------+---------+-----------+----------+--------------+ LEFT     CompressibilityPhasicitySpontaneityPropertiesThrombus Aging +---------+---------------+---------+-----------+----------+--------------+ CFV      Full           Yes      Yes                                 +---------+---------------+---------+-----------+----------+--------------+ SFJ      Full                                                        +---------+---------------+---------+-----------+----------+--------------+ FV Prox  Full                                                         +---------+---------------+---------+-----------+----------+--------------+ FV Mid   Full                                                        +---------+---------------+---------+-----------+----------+--------------+  FV DistalFull                                                        +---------+---------------+---------+-----------+----------+--------------+ PFV      Full                                                        +---------+---------------+---------+-----------+----------+--------------+ POP      Full           Yes      Yes                                 +---------+---------------+---------+-----------+----------+--------------+ PTV      Full                                                        +---------+---------------+---------+-----------+----------+--------------+ PERO     Full                                                        +---------+---------------+---------+-----------+----------+--------------+  Summary: BILATERAL: - No evidence of deep vein thrombosis seen in the lower extremities, bilaterally. -No evidence of popliteal cyst, bilaterally.   *See table(s) above for measurements and observations. Electronically signed by Waverly Ferrari MD on 04/23/2020 at 11:27:04 AM.    Final    US Abdomen Limited RUQ (LIVER/GB)  Result Date: 04/24/2020 CLINICAL DATA:  Elevated liver enzymes EXAM: ULTRASOUND ABDOMEN LIMITED RIGHT UPPER QUADRANT COMPARISON:  None. FINDINGS: Gallbladder: Surgically absent. Common bile duct: Diameter: 5 mm. No intrahepatic or extrahepatic biliary duct dilatation. Liver: No focal lesion identified. Within normal limits in parenchymal echogenicity. Portal vein is patent on color Doppler imaging with normal direction of blood flow towards the liver. Other: None. IMPRESSION: Gallbladder absent.  Study otherwise unremarkable. Electronically Signed   By: Bretta Bang III M.D.   On: 04/24/2020 15:05       ASSESSMENT & PLAN:   Assessment/Plan:  A 60 y.o. female with *** .The patient understands all the plans discussed today and is in agreement with them.      Elim Economou Kirby Funk, MD

## 2020-04-24 NOTE — Interval H&P Note (Signed)
History and Physical Interval Note:  04/24/2020 7:45 AM  Madison Hurst  has presented today for surgery, with the diagnosis of stroke.  The various methods of treatment have been discussed with the patient and family. After consideration of risks, benefits and other options for treatment, the patient has consented to  Procedure(s): TRANSESOPHAGEAL ECHOCARDIOGRAM (TEE) (N/A) as a surgical intervention.  The patient's history has been reviewed, patient examined, no change in status, stable for surgery.  I have reviewed the patient's chart and labs.  Questions were answered to the patient's satisfaction.     Meriam Sprague

## 2020-04-24 NOTE — Anesthesia Procedure Notes (Signed)
Procedure Name: MAC Date/Time: 04/24/2020 8:24 AM Performed by: Lavell Luster, CRNA Pre-anesthesia Checklist: Patient identified, Emergency Drugs available, Suction available, Patient being monitored and Timeout performed Patient Re-evaluated:Patient Re-evaluated prior to induction Oxygen Delivery Method: Nasal cannula Induction Type: IV induction Placement Confirmation: breath sounds checked- equal and bilateral and positive ETCO2 Dental Injury: Teeth and Oropharynx as per pre-operative assessment

## 2020-04-24 NOTE — Progress Notes (Addendum)
STROKE TEAM PROGRESS NOTE   INTERVAL HISTORY No acute events since arrival.  TEE performed this morning. Patient tolerated well. Results pending.   Patient continues drowsy and requires stimulation for ROS. She received ativan last evening at 2000 and is s/p TEE.  Continues to deny pain. She does not endorse chest pain, neck pain, radiating arm pain, back pain or leg pain. She also denies numbness or tingling in face, arms or legs. She states she is here because of heart problems. I attempted to discuss her stroke diagnosis and treatment plan however, her drowsiness and inattentiveness impaired the discussion.   RN at bedside reports patient was drowsy prior to TEE as well.    Vitals:   04/24/20 0855 04/24/20 0900 04/24/20 0940 04/24/20 1018  BP: 105/73 116/85  (!) 125/92  Pulse:      Resp: 13 14 16 13   Temp:    97.6 F (36.4 C)  TempSrc:    Axillary  SpO2:  97% 100% 100%  Weight:      Height:       CBC:  Recent Labs  Lab 04/22/20 1342 04/23/20 0337 04/24/20 0114  WBC 13.2* 13.0* 12.5*  NEUTROABS 10.6*  --   --   HGB 14.9 14.5 13.5  HCT 49.0* 47.7* 42.5  MCV 100.8* 102.4* 102.2*  PLT 342 319 250   Basic Metabolic Panel:  Recent Labs  Lab 04/23/20 0337 04/24/20 0114  NA 136 138  K 3.5 3.8  CL 99 101  CO2 24 24  GLUCOSE 127* 131*  BUN 13 13  CREATININE 0.64 0.55  CALCIUM 8.0* 8.3*  MG  --  1.6*   Lipid Panel:  Recent Labs  Lab 04/22/20 1838  CHOL 159  TRIG 111  HDL 49  CHOLHDL 3.2  VLDL 22  LDLCALC 88   HgbA1c:  Recent Labs  Lab 04/22/20 1838  HGBA1C 6.1*   Urine Drug Screen:  Recent Labs  Lab 04/23/20 0139  LABOPIA POSITIVE*  COCAINSCRNUR NONE DETECTED  LABBENZ NONE DETECTED  AMPHETMU NONE DETECTED  THCU NONE DETECTED  LABBARB NONE DETECTED    Alcohol Level  Recent Labs  Lab 04/22/20 1342  ETH <10    IMAGING past 24 hours MR CERVICAL SPINE WO CONTRAST  Result Date: 04/23/2020 CLINICAL DATA:  Myelopathy, acute or progressive.  Additional history provided: Patient reports worsening weakness on the left side, most notably within the last week, 3 falls in the last 2 days weakness all over, but the left-sided seems weaker than the right. EXAM: MRI CERVICAL SPINE WITHOUT CONTRAST TECHNIQUE: Multiplanar, multisequence MR imaging of the cervical spine was performed. No intravenous contrast was administered. COMPARISON:  CT head/neck 04/22/2020.  CT cervical spine 01/26/2015. FINDINGS: All sequences are moderate to severely motion degraded, significantly limiting evaluation. Alignment: Straightening of the expected cervical lordosis. No significant spondylolisthesis. Vertebrae: No significant marrow edema or focal suspicious osseous Lea is identified. Cord: Motion degradation precludes adequate evaluation for spinal cord signal abnormality. Posterior Fossa, vertebral arteries, paraspinal tissues: Cerebellar atrophy. No acute abnormality identified within included portions of the posterior fossa. Cervical vertebral artery flow voids poorly assessed due to motion degradation. No paraspinal soft tissue abnormality is identified. Disc levels: Multilevel disc degeneration. Most notably, there is moderate disc degeneration at the C4-C5, C5-C6 and C6-C7 levels. C2-C3: Disc bulge. Facet hypertrophy. No significant spinal canal stenosis. Mild bilateral neural foraminal narrowing. C3-C4: Disc bulge. Bilateral disc osteophyte ridge/uncinate hypertrophy (greater on the right). Facet and ligamentum flavum hypertrophy. Apparent  moderate spinal canal stenosis. Bilateral neural foraminal narrowing (severe right, mild left). C4-C5: Posterior disc osteophyte complex. Uncovertebral, facet and ligamentum flavum hypertrophy. Apparent moderate/severe spinal canal stenosis. Bilateral neural foraminal narrowing (moderate/severe right, moderate left). C5-C6: Posterior disc osteophyte complex. Uncovertebral, facet and ligamentum flavum hypertrophy. Apparent  moderate/severe spinal canal stenosis. Bilateral neural foraminal narrowing (moderate right, moderate/severe left). C6-C7: Posterior disc osteophyte complex. Uncovertebral, facet and ligamentum flavum hypertrophy. Apparent moderate spinal canal stenosis. Bilateral neural foraminal narrowing (moderate/severe right, moderate left). C7-T1: Mild endplate spurring on the left. Mild facet hypertrophy (greater on the left). No appreciable disc herniation or spinal canal stenosis. Mild left neural foraminal narrowing. IMPRESSION: All acquired sequences are moderate to severely motion degraded, significantly limiting evaluation. A repeat examination should be considered when the patient is better able to tolerate the study. Cervical spondylosis as outlined. Most notably, there is apparent moderate/severe spinal canal stenosis at C4-C5 and C5-C6, and apparent moderate spinal canal stenosis at C3-C4 and C6-C7. Motion degradation limits evaluation for subtle spinal cord mass effect and precludes adequate evaluation for spinal cord signal abnormality. Multilevel neural foraminal narrowing as detailed and greatest on the right at C3-C4 (severe), on the right at C4-C5 (moderate/severe), on the left at C5-C6 (moderate/severe) and on the right at C6-C7 (moderate/severe). Electronically Signed   By: Kellie Simmering DO   On: 04/23/2020 12:27   ECHOCARDIOGRAM COMPLETE  Result Date: 04/23/2020    ECHOCARDIOGRAM REPORT   Patient Name:   Madison Hurst Date of Exam: 04/23/2020 Medical Rec #:  [id number]                     Height:       60.0 in Accession #:    [id number]                    Weight:       123.0 lb Date of Birth:  03-Feb-1961                    BSA:          1.518 m Patient Age:    60 years                      BP:           115/102 mmHg Patient Gender: F                             HR:           107 bpm. Exam Location:  Inpatient Procedure: 2D Echo and Color Doppler Indications:    Stroke 434.91 / I163.9   History:        Patient has prior history of Echocardiogram examinations, most                 recent 03/11/2020. Signs/Symptoms:Dyspnea; Risk                 Factors:Diabetes. Hypothyroidism.  Sonographer:    Tiffany Dance Referring Phys: [id number] RONDELL A SMITH IMPRESSIONS  1. Left ventricular ejection fraction, by estimation, is 20 to 25%. The left ventricle has severely decreased function. The left ventricle demonstrates global hypokinesis. Left ventricular diastolic parameters are indeterminate.  2. Right ventricular systolic function is normal. The right ventricular size is normal.  3. Left atrial size was moderately dilated.  4. The mitral valve is normal in structure.  Mild mitral valve regurgitation. No evidence of mitral stenosis.  5. The aortic valve is tricuspid. Aortic valve regurgitation is not visualized. No aortic stenosis is present.  6. The inferior vena cava is normal in size with greater than 50% respiratory variability, suggesting right atrial pressure of 3 mmHg. FINDINGS  Left Ventricle: Left ventricular ejection fraction, by estimation, is 20 to 25%. The left ventricle has severely decreased function. The left ventricle demonstrates global hypokinesis. The left ventricular internal cavity size was normal in size. There is no left ventricular hypertrophy. Left ventricular diastolic parameters are indeterminate. Right Ventricle: The right ventricular size is normal. Right ventricular systolic function is normal. Left Atrium: Left atrial size was moderately dilated. Right Atrium: Right atrial size was normal in size. Pericardium: There is no evidence of pericardial effusion. Mitral Valve: The mitral valve is normal in structure. Mild mitral annular calcification. Mild mitral valve regurgitation. No evidence of mitral valve stenosis. Tricuspid Valve: The tricuspid valve is normal in structure. Tricuspid valve regurgitation is trivial. No evidence of tricuspid stenosis. Aortic Valve: The aortic  valve is tricuspid. Aortic valve regurgitation is not visualized. No aortic stenosis is present. Pulmonic Valve: The pulmonic valve was grossly normal. Pulmonic valve regurgitation is not visualized. No evidence of pulmonic stenosis. Aorta: The aortic root is normal in size and structure. Venous: The inferior vena cava is normal in size with greater than 50% respiratory variability, suggesting right atrial pressure of 3 mmHg. IAS/Shunts: No atrial level shunt detected by color flow Doppler.  LEFT VENTRICLE PLAX 2D LVIDd:         4.70 cm LVIDs:         4.00 cm LV PW:         1.20 cm LV IVS:        0.70 cm LVOT diam:     1.90 cm LV SV:         19 LV SV Index:   13 LVOT Area:     2.84 cm  RIGHT VENTRICLE            IVC RV Basal diam:  2.20 cm    IVC diam: 2.10 cm RV S prime:     6.42 cm/s TAPSE (M-mode): 1.2 cm LEFT ATRIUM             Index       RIGHT ATRIUM           Index LA diam:        5.10 cm 3.36 cm/m  RA Area:     12.90 cm LA Vol (A2C):   68.6 ml 45.19 ml/m RA Volume:   27.30 ml  17.98 ml/m LA Vol (A4C):   64.6 ml 42.55 ml/m LA Biplane Vol: 67.3 ml 44.33 ml/m  AORTIC VALVE LVOT Vmax:   48.75 cm/s LVOT Vmean:  30.800 cm/s LVOT VTI:    0.068 m  AORTA Ao Root diam: 2.70 cm Ao Asc diam:  3.40 cm MITRAL VALVE MV Area (PHT): 5.13 cm    SHUNTS MV Decel Time: 148 msec    Systemic VTI:  0.07 m MV E velocity: 92.40 cm/s  Systemic Diam: 1.90 cm MV A velocity: 71.10 cm/s MV E/A ratio:  1.30 Kirk Ruths MD Electronically signed by Kirk Ruths MD Signature Date/Time: 04/23/2020/2:25:22 PM    Final    ECHO TEE  Result Date: 04/24/2020    TRANSESOPHOGEAL ECHO REPORT   Patient Name:   Madison Hurst Date of Exam: 04/24/2020 Medical Rec #:  [id number]                     Height:       60.0 in Accession #:    [id number]                    Weight:       128.7 lb Date of Birth:  1960-04-15                    BSA:          1.548 m Patient Age:    61 years                      BP:           135/98 mmHg  Patient Gender: F                             HR:           90 bpm. Exam Location:  Inpatient Procedure: Transesophageal Echo, 3D Echo, Color Doppler and Cardiac Doppler Indications:     Stroke i163.9  History:         Patient has prior history of Echocardiogram examinations, most                  recent 04/23/2020. Risk Factors:Hypertension and Diabetes.  Sonographer:     Raquel Sarna Senior RDCS Referring Phys:  [id number] Greer Ee [place] Diagnosing Phys: Gwyndolyn Kaufman MD PROCEDURE: After discussion of the risks and benefits of a TEE, an informed consent was obtained from the patient. The transesophogeal probe was passed without difficulty through the esophogus of the patient. Local oropharyngeal anesthetic was provided with Cetacaine. Sedation performed by different physician. The patient was monitored while under deep sedation. Anesthestetic sedation was provided intravenously by Anesthesiology: 87m of Propofol. The patient developed no complications during the procedure. IMPRESSIONS  1. Left ventricular ejection fraction, by estimation, is 20 to 25%. The left ventricle has severely decreased function. The left ventricle demonstrates global hypokinesis.  2. Right ventricular systolic function is moderately reduced. The right ventricular size is normal.  3. Left atrial size was moderately dilated. No left atrial/left atrial appendage thrombus was detected. The LAA emptying velocity was 30 cm/s.  4. Right atrial size was mildly dilated.  5. The mitral valve is degenerative. Mild to moderate mitral valve regurgitation.  6. The aortic valve is tricuspid. There is mild calcification of the aortic valve. There is mild thickening of the aortic valve. Aortic valve regurgitation is trivial.  7. 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  8. A small PFO was detected by color flow Doppler with predominant L-->R shunting, however, there is flow reversal  (R-->L) with valsalva.  9. Agitated saline contrast bubble study was positive with shunting observed within 3-6 cardiac cycles with valsalva suggestive of interatrial shunt. FINDINGS  Left Ventricle: Left ventricular ejection fraction, by estimation, is 20 to 25%. The left ventricle has severely decreased function. The left ventricle demonstrates global hypokinesis. The left ventricular internal cavity size was normal in size. Right Ventricle: The right ventricular size is normal. No increase in right ventricular wall thickness. Right ventricular systolic function is moderately reduced. Left Atrium: Left atrial size was moderately dilated. No left atrial/left atrial appendage thrombus was detected. The LAA emptying velocity was 30 cm/s. Right  Atrium: Right atrial size was mildly dilated. Pericardium: There is no evidence of pericardial effusion. Mitral Valve: The mitral valve is degenerative in appearance. There is mild thickening of the mitral valve leaflet(s). There is mild calcification of the mitral valve leaflet(s). Mild to moderate mitral valve regurgitation. Tricuspid Valve: The tricuspid valve is normal in structure. Tricuspid valve regurgitation is mild. Aortic Valve: The aortic valve is tricuspid. There is mild calcification of the aortic valve. There is mild thickening of the aortic valve. Aortic valve regurgitation is trivial. 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 Pulmonic Valve: The pulmonic valve was normal in structure. Pulmonic valve regurgitation is trivial. Aorta: The aortic root and ascending aorta are structurally normal, with no evidence of dilitation. IAS/Shunts: Evidence of atrial level shunting detected by color flow Doppler. Agitated saline contrast was given intravenously to evaluate for intracardiac shunting. Agitated saline contrast bubble study was positive with shunting observed within 3-6 cardiac cycles suggestive  of interatrial shunt. A small patent foramen ovale is detected. Gwyndolyn Kaufman MD Electronically signed by Gwyndolyn Kaufman MD Signature Date/Time: 04/24/2020/9:38:38 AM    Final    PHYSICAL EXAM Constitutional: Well developed middle aged female lying in bed with eyes closed. NAD.  MSK: Arthritic changes of bilateral hands and toes are present Respiratory: No increased work of breathing  Skin: Flushed face. Purplish toes which patient reports is baseline secondary to her arthritis  Neuro: Mental Status: Patient is drowsy.  She arouses easily with verbal stimuli. Remains drowsy and requires constant verbal stimulation to continue in conversation.  Speech is clear and fluent   Cranial Nerves: II: Visual Fields are full. Pupils are equal, round, and reactive to light.   III,IV, VI: EOMI without ptosis or diploplia.  V: Facial sensation is symmetric to temperature VII: Facial movement is symmetric.  VIII: hearing is intact to voice X: Uvula elevates symmetrically XI: Shoulder shrug is symmetric. XII: tongue is midline without atrophy or fasciculations.  Motor: Tone is normal. Bulk is normal.  Right upper extremity 4/5 throughout, left upper extremity 4-/5 throughout, right lower extremity 5/5 throughout, left lower extremity 4/5 hip flexion, otherwise 5/5 throughout Sensory: Sensation is symmetric to light touch and temperature in the arms and legs. Plantars: Toes are downgoing bilaterally.  Cerebellar: FNF and HKS intact bilaterally  Slight left sided facial weakness.  Patient with generalized weakness of her upper and lower extremities, but more so on the left.  No Hoffman's bilat UE. Decreased grip on the left with pronator drift.  Patient is able to hold her right arm and leg up against gravity, but unable to do so for more than a few seconds on the left side.   ASSESSMENT/PLAN Madison Hurst is a 60 y.o. female with medical history significant of paroxysmal  atrial tachycardia, CAD (newly diagnosed) with plan for evaluation by catheterization this month, hypertension, hyperlipidemia, diabetes mellitus type 2, rheumatoid arthritis, anxiety, and depression presents with complaints of worsening weakness on the left side most notably in the last week.    Stroke - right MCA/PCA, PCA, thalamus, right PLIC and CR scattered small/punctate infarcts, concerning for cardio-embolic source in the setting of cardiomyopathy with low EF   CT head No acute abnormality.Small vessel disease. Atrophy.  CTA head & neck:  No emergent large vessel occlusion. Severe stenosis of the right PCA P3 segment over a length of 70m. Mild stenosis of the left PCA P3 segment.  MRI Brain:  Acute infarction affecting the right mid-posterior caudate and right superior thalamus. Single separate punctate acute infarction in the cortex of the medial right parietooccipital junction. Few small punctate acute infarctions in the right occipital cortex.   LE venous Doppler: No evidence of DVT  2D Echo EF 20-25%, severely decreased function of left ventricle with global hypokinesis.  TEE: LVEF is severely reduced 20-25% with global hypokinesis  No loop per EP but agree with 30 day cardiac event monitoring as outpt to rule out afib.   LDL 88  HgbA1c 6.1  VTE prophylaxis - lovenox   No AC prior to admission, now on ASA 55m and plavix daily DAPT.  Given embolic strokes with low EF, will recommend anticoagulation with eliquis. Repeat TTE every 3 months, once EF >35%, can change AC to antiplatelet.   Therapy recommendations:  CIR  Disposition:  TBD  Ischemic DCM, ASCAD  Cardiology consulted and following  Last echocardiogram on 03/11/2020 demonstrated EF 40 to 45%.  TEE 1/26 revealed PFO and EF 20-25%.   TTE 1/25 indicated worsening with EF of 20-25% and severely decreased function of left ventricle with global hypokinesis.    recent coronary CT with markedly elevated calcium score  and cardiac cath was planned for this month   Zio patch 3-7 days in 02/2020 showed AT with variable block and frequent PACs  EKG this admission - atrial tachycardia with irregular rhythm  No loop per EP but agree with 30 day cardiac event monitoring as outpt to rule out afib.   Not a candidate for cath given acute stroke per cardiology  Continue ASA 81.  Given embolic strokes with low EF, will recommend anticoagulation with eliquis. Repeat TTE every 3 months, once EF >35%, can change AC to antiplatelet.   PFO  TEE showed PFO present with L-->R shunting by color doppler.   LE venous doppler neg for DVT  ROPE most at 4  Given pt significant cardiac issue, L->R shunt, PFO likely not associated with current stroke  Hypertension  Home meds:  Micardis 264mdaily  Now on avapro and metoprolol  Stable on admission . Long-term BP goal normotensive  Hyperlipidemia  Home meds:  205mrestor started two weeks ago.   HOLD due to acute transaminitis.  LDL 88, goal < 70  Continue statin once LFT normalizes   Diabetes type II Controlled  Home meds:  Metformin (on hold)  HgbA1c 6.1, goal < 7.0  CBGs  SSI sensitive   Close PCP follow up   Acute transaminitis  Alkaline phosphatase 162,   AST/ALT  65/62->203/121->569/313  Acute hepatitis panel negative   Right upper quadrant US [place]  Statin on hold  Management per primary team   Other Stroke Risk Factors  Former cigarette smoker:  reports that she quit smoking about 26 years ago. She has never used smokeless tobacco.   Other Active Problems managed by primary team:  RA with chronic pain managed by opioids : on Orencia injections weekly and prednisone 5mg105mily along with Vicodin for pain control. NSAIDS on hold due to bleeding risk. ESR WNL.  Concern for possible cervical myelopathy: Generalized weakness/hyperreflexia and falls prompted evaluation by cervical MRI which is of poor quality.   Leukocytosis/SIRS:  WBCs 13->12.5 .  Afebrile.   Hypothyroidism: TSH 0.74 on admission, home levothyroxine 76 mcg daily continued.   Hospital day # 2  This plan of care was discussed with and directed by Dr. Zamir Staples. Erlinda HongeliHetty Blend-C  ATTENDING NOTE: I  reviewed above note and agree with the assessment and plan. Pt was seen and examined.   Pt lying in bed, no family at bedside. Pt no acute event overnight. Still has left UE weakness, unchanged. Had TEE today showed EF 20-25% and PFO with L->R shunting. ROPE score calculated the most at 4, therefore the PFO is likely not her cause of stroke. Instead, given her severely depressed LV function with EF 20-25%, cardiomyopathy most likely the etiology for stroke. Will recommend anticoagulation with eliquis for stroke prevention. Cardiology on board, cardiac cath on hold so far given acute stroke. Continue ASA 81 given CAD. Pt AST/ALT continues to trend up, hold off statin for now. Discussed with Dr. [place]   Rosalin Hawking, MD PhD Stroke Neurology 04/24/2020 10:34 AM   To contact Stroke Continuity provider, please refer to [email or web address] After hours, contact General Neurology

## 2020-04-24 NOTE — Progress Notes (Signed)
Occupational Therapy Evaluation Patient Details Name: Madison Hurst MRN: [id number] DOB: 10/11/1960 Today's Date: 04/24/2020    History of Present Illness 60 year old female with past medical history significant for paroxysmal atrial fibrillation, CAD, essential hypertension, hyperlipidemia, type 2 diabetes mellitus, rheumatoid arthritis, anxiety, depression who presented to the ED with progressive weakness on the left side with rapid progression over the last week resulting in 3 falls. 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 Noted in cardiology note: LVEF is severely reduced 20-25% with global hypokinesis   Clinical Impression   Pt received in recliner this date, nurse and husband present with significant fatigue and lethargy limiting full assessment. Pleasant and reporting noticing L hemi weakness as well as baseline limitations from arthritis to B UE's. At baseline pt is Indep with all ADL's and mobility, no recent falls. Per husband. Pt presents with decreased coordination, strength to L hemibody, activity tolerance, balance and strength. Currently full assessment of vision limited d/t lethargy/ability to attend to task, pt requiring mod A for transition to standing and max A for pivot transfers and bed mobility this date, as well as demos need for mod-max A for ADL's at this time. Anticipate with improved level of arousal pt's indep will improve but will continue to monitor. Pt to continue to benefit from skilled OT acutely to maximize indep and safety with functional transfers and ADL's, with recommendations listed below.     Follow Up Recommendations  CIR;Supervision/Assistance - 24 hour    Equipment Recommendations   (TBD)    Recommendations for Other Services Rehab consult     Precautions / Restrictions Precautions Precautions: Fall Precaution Comments: EF 20-25% Restrictions Weight Bearing Restrictions: No       Mobility Bed Mobility Overal bed mobility: Needs Assistance Bed Mobility: Sit to Supine     Sit to supine: Max assist;HOB elevated        Transfers Overall transfer level: Needs assistance Equipment used: 1 person hand held assist Transfers: Stand Pivot Transfers;Sit to/from Stand Sit to Stand: Mod assist Stand pivot transfers: Max assist       General transfer comment: cues for sequencing, increased assist required at this time d/t fatigue    Balance Overall balance assessment: Needs assistance Sitting-balance support: Single extremity supported;No upper extremity supported;Feet supported Sitting balance-Leahy Scale: Poor Sitting balance - Comments: reliant on UE support or minA Postural control: Right lateral lean Standing balance support: Bilateral upper extremity supported Standing balance-Leahy Scale: Poor                         ADL either performed or assessed with clinical judgement   ADL Overall ADL's : Needs assistance/impaired     Grooming: Minimal assistance;Sitting           Upper Body Dressing : Moderate assistance;Sitting   Lower Body Dressing: Maximal assistance;Cueing for safety   Toilet Transfer: Moderate assistance;Stand-pivot;Cueing for safety             General ADL Comments: difficulty with assessing what appears as an accurate level of performance for completion of ADL's d/t inability to sustain level of arousal. requiring anywhere from min-max A for safe completion of seated and standing ADL's. will continue to assess level of performance with further participation with therapy session. anticiapte with improved arousal pt will demo increased performance.     Vision Baseline Vision/History: Wears glasses Patient Visual Report: Other (comment) Vision Assessment?: Yes Ocular Range of  Motion: Within Functional Limits Alignment/Gaze Preference: Head turned Convergence: Impaired - to be further tested in functional  context Visual Fields: No apparent deficits Additional Comments: difficulty with assessing, occular motor ability intact, no change in vision from baseline per report but limited assessment     Perception     Praxis      Pertinent Vitals/Pain Pain Assessment: No/denies pain     Hand Dominance Right   Extremity/Trunk Assessment Upper Extremity Assessment Upper Extremity Assessment: RUE deficits/detail;LUE deficits/detail RUE Deficits / Details: pt with only mild strength deficits present to R UE husband confirming/reporting that basel;ine with arthritis limiting strength to R side baseline but appears [facility] [address] for participation and completion of basic ALD's. LUE Deficits / Details: mild deficits fluctuating from +3 to -4/5 limited by arousal and alertness with participation.   Lower Extremity Assessment Lower Extremity Assessment: Defer to PT evaluation    Cervical / Trunk Assessment Cervical / Trunk Assessment: Normal   Communication Communication Communication: No difficulties   Cognition Arousal/Alertness: Lethargic (difficulty with sustained arousal/alertness) Behavior During Therapy: Flat affect Overall Cognitive Status: Impaired/Different from baseline Area of Impairment: Orientation;Attention;Following commands;Safety/judgement;Awareness                 Orientation Level: Disoriented to;Time Current Attention Level: Sustained Memory: Decreased recall of precautions;Decreased short-term memory Following Commands: Follows one step commands consistently Safety/Judgement: Decreased awareness of safety;Decreased awareness of deficits Awareness: Emergent Problem Solving: Slow processing;Difficulty sequencing;Requires verbal cues;Requires tactile cues     General Comments  VSS on RA during session    Exercises     Shoulder Instructions      Home Living Family/patient expects to be discharged to:: Private residence Living Arrangements: Spouse/significant  other Available Help at Discharge: Family;Available PRN/intermittently Type of Home: House Home Access: Stairs to enter [employer] of Steps: 2 Entrance Stairs-Rails: Can reach both Home Layout: One level     Bathroom Shower/Tub: Chief Strategy Officer: Standard     Home Equipment: Cane - single point;Wheelchair - manual;Shower seat;Bedside commode   Additional Comments: husband supportive at bedside      Prior Functioning/Environment Level of Independence: Needs assistance  Gait / Transfers Assistance Needed: pt reports being indep for ADLs' and mobility; but noted that reporting to PT services that she require A for picking items up off the floor ADL's / Homemaking Assistance Needed: intermittent min A            OT Problem List: Decreased strength;Decreased activity tolerance;Impaired balance (sitting and/or standing);Decreased coordination;Decreased safety awareness;Decreased knowledge of use of DME or AE;Decreased knowledge of precautions;Cardiopulmonary status limiting activity      OT Treatment/Interventions: Self-care/ADL training;Therapeutic exercise;Neuromuscular education;Energy conservation;DME and/or AE instruction;Therapeutic activities;Cognitive remediation/compensation;Balance training;Patient/family education    OT Goals(Current goals can be found in the care plan section) Acute Rehab OT Goals Patient Stated Goal: to get better OT Goal Formulation: With patient Time For Goal Achievement: 05/08/20 Potential to Achieve Goals: Good ADL Goals Pt Will Perform Grooming: with set-up;sitting Pt Will Perform Upper Body Dressing: with supervision;sitting Pt Will Perform Lower Body Dressing: with supervision;with adaptive equipment;sit to/from stand Pt Will Transfer to Toilet: bedside commode;regular height toilet;grab bars;with supervision Pt/caregiver will Perform Home Exercise Program: With written HEP provided;Left upper extremity;Increased  strength;With Supervision Additional ADL Goal #1: Pt will demo ability to utilize 3 energy conservation techniques/modification during self care tasks  OT Frequency: Min 2X/week   Barriers to D/C:            Co-evaluation  AM-PAC OT "6 Clicks" Daily Activity     Outcome Measure Help from another person eating meals?: A Little Help from another person taking care of personal grooming?: A Little Help from another person toileting, which includes using toliet, bedpan, or urinal?: A Lot Help from another person bathing (including washing, rinsing, drying)?: A Lot Help from another person to put on and taking off regular upper body clothing?: A Little Help from another person to put on and taking off regular lower body clothing?: A Lot 6 Click Score: 15   End of Session Nurse Communication: Mobility status  Activity Tolerance: Patient limited by fatigue;Patient limited by lethargy Patient left: in bed;with nursing/sitter in room;with family/visitor present  OT Visit Diagnosis: Unsteadiness on feet (R26.81)                Time: 2505-3976 OT Time Calculation (min): 17 min Charges:  OT General Charges $OT Visit: 1 Visit OT Evaluation $OT Eval Moderate Complexity: 1 Mod  Madison Hurst OTR/L acute rehab services Office: [phone]   Madison Hurst 04/24/2020, 1:49 PM

## 2020-04-25 ENCOUNTER — Other Ambulatory Visit: Payer: Self-pay | Admitting: Medical

## 2020-04-25 ENCOUNTER — Encounter (HOSPITAL_COMMUNITY): Payer: Self-pay | Admitting: Cardiology

## 2020-04-25 ENCOUNTER — Inpatient Hospital Stay: Payer: Medicare PPO | Attending: Oncology

## 2020-04-25 ENCOUNTER — Inpatient Hospital Stay: Payer: Medicare PPO | Admitting: Oncology

## 2020-04-25 ENCOUNTER — Ambulatory Visit: Payer: Medicare PPO | Admitting: Oncology

## 2020-04-25 ENCOUNTER — Other Ambulatory Visit: Payer: Medicare PPO

## 2020-04-25 DIAGNOSIS — I10 Essential (primary) hypertension: Secondary | ICD-10-CM | POA: Diagnosis not present

## 2020-04-25 DIAGNOSIS — I639 Cerebral infarction, unspecified: Secondary | ICD-10-CM

## 2020-04-25 DIAGNOSIS — R7989 Other specified abnormal findings of blood chemistry: Secondary | ICD-10-CM | POA: Diagnosis not present

## 2020-04-25 DIAGNOSIS — I952 Hypotension due to drugs: Secondary | ICD-10-CM

## 2020-04-25 DIAGNOSIS — I471 Supraventricular tachycardia: Secondary | ICD-10-CM | POA: Diagnosis not present

## 2020-04-25 DIAGNOSIS — I420 Dilated cardiomyopathy: Secondary | ICD-10-CM | POA: Diagnosis not present

## 2020-04-25 DIAGNOSIS — R7401 Elevation of levels of liver transaminase levels: Secondary | ICD-10-CM

## 2020-04-25 LAB — COMPREHENSIVE METABOLIC PANEL
ALT: 606 U/L — ABNORMAL HIGH (ref 0–44)
AST: 989 U/L — ABNORMAL HIGH (ref 15–41)
Albumin: 2.5 g/dL — ABNORMAL LOW (ref 3.5–5.0)
Alkaline Phosphatase: 386 U/L — ABNORMAL HIGH (ref 38–126)
Anion gap: 11 (ref 5–15)
BUN: 13 mg/dL (ref 6–20)
CO2: 22 mmol/L (ref 22–32)
Calcium: 8.3 mg/dL — ABNORMAL LOW (ref 8.9–10.3)
Chloride: 104 mmol/L (ref 98–111)
Creatinine, Ser: 0.72 mg/dL (ref 0.44–1.00)
GFR, Estimated: >60 mL/min (ref >60)
Glucose, Bld: 110 mg/dL — ABNORMAL HIGH (ref 70–99)
Potassium: 6 mmol/L — ABNORMAL HIGH (ref 3.5–5.1)
Sodium: 137 mmol/L (ref 135–145)
Total Bilirubin: 1.4 mg/dL — ABNORMAL HIGH (ref 0.3–1.2)
Total Protein: 4.9 g/dL — ABNORMAL LOW (ref 6.5–8.1)

## 2020-04-25 LAB — BASIC METABOLIC PANEL
Anion gap: 19 — ABNORMAL HIGH (ref 5–15)
BUN: 14 mg/dL (ref 6–20)
CO2: 14 mmol/L — ABNORMAL LOW (ref 22–32)
Calcium: 8.6 mg/dL — ABNORMAL LOW (ref 8.9–10.3)
Chloride: 102 mmol/L (ref 98–111)
Creatinine, Ser: 0.66 mg/dL (ref 0.44–1.00)
GFR, Estimated: >60 mL/min (ref >60)
Glucose, Bld: 130 mg/dL — ABNORMAL HIGH (ref 70–99)
Potassium: >7.5 mmol/L (ref 3.5–5.1)
Sodium: 135 mmol/L (ref 135–145)

## 2020-04-25 LAB — POTASSIUM: Potassium: 4.8 mmol/L (ref 3.5–5.1)

## 2020-04-25 LAB — GLUCOSE, CAPILLARY
Glucose-Capillary: 107 mg/dL — ABNORMAL HIGH (ref 70–99)
Glucose-Capillary: 100 mg/dL — ABNORMAL HIGH (ref 70–99)
Glucose-Capillary: 116 mg/dL — ABNORMAL HIGH (ref 70–99)
Glucose-Capillary: 143 mg/dL — ABNORMAL HIGH (ref 70–99)

## 2020-04-25 NOTE — Progress Notes (Signed)
PROGRESS NOTE    Madison Hurst  YQI:[id number] DOB: Dec 29, 1960 DOA: 04/22/2020 PCP: Serita Grammes, MD    Brief Narrative:  Madison Hurst is a 60 year old female with past medical history significant for paroxysmal atrial fibrillation, CAD, essential hypertension, hyperlipidemia, type 2 diabetes mellitus, rheumatoid arthritis, anxiety, depression who presented to the ED with progressive weakness on the left side with rapid progression over the last week.  Patient reports over the last 2 days, falls on 3 occasions.  Patient states when she fell today, she hit her head slightly.  Patient also with weakness feeling like she is going to pass out, but not as loss of consciousness or syncopal event.  Patient also reports associated headaches that are intermittent with associated sweats; in which these have been present for roughly 1 year.  In the ED, afebrile, HR 125, RR 14, BP 125/102, SPO2 100% on room air.  Sodium 137, potassium 3.7, chloride 98, CO2 28, glucose 173, BUN 15, creatinine 0.59, AST 65, ALT 62.  CK 87.  WBC 13.2, hemoglobin 14.9, platelets 342.  INR 1.3.  TSH 0.740.  SARS-CoV-2 negative.  EtOH level less than 10.  Urinalysis with moderate leukocytes, negative nitrite, rare bacteria, 11-20 WBCs.  UDS positive for opiates.  CT head without contrast with no acute intracranial abnormality.  MRI head positive for acute infarction consistent with posterior circulation embolic disease without signs of hemorrhage.  Patient was given 1 L NS bolus, 5 mg of metoprolol IV and 3 and 24 mg of aspirin.  Neurology was consulted.  Hospitalist service consulted for further evaluation and management of acute CVA.   Assessment & Plan:   Principal Problem:   CVA (cerebral vascular accident) ([place]) Active Problems:   Anxiety   Hypothyroidism   Seropositive rheumatoid arthritis ([place])   Type 2 diabetes mellitus (HCC)   PAT (paroxysmal atrial tachycardia) (HCC)    Leukocytosis   Essential hypertension   CVA, acute; concerning for embolic etiology Patient presenting to the ED following several falls with associated left-sided weakness progressing over the last week.  MR brain notable for acute infarct consistent with posterior circulation embolic disease.  CTA head/neck with no large vessel occlusion.  Total cholesterol 159, HDL 49, LDL 88.  ESR 3.  TSH 0.740.  Hemoglobin A1c 6.1.  TTE with LVEF 20-25% (40-45% Dec 2021), LV global hypokinesis, RV systolic function within normal limits, LA moderately dilated, IVC normal in size.  TEE 1/26 with LVEF 20-25 percent, with global hypokinesis and PFO with left to right shunt; but neurology believes PFO not etiology of her acute stroke. --Neurology following, appreciate assistance --Eliquis 40m BID for concern over embolic event likely related to her cardiomyopathy --ASA 858mPO daily for underlying CAD --EP plans to place event monitor --PT/OT recommends CIR --Continue monitor on telemetry  Paroxysmal atrial tachycardia/sinus tachycardia On admission, patient notable with elevated heart rates in the 120s.  Patient also reports she had an abnormal coronary CT in which she is following with cardiology, Dr. ToHarriet Massonutpatient. --Cardiology following --Metoprolol succinate 12.5 mg p.o. daily --Continue to monitor on telemetry  Patent foramen ovale TEE 1/26 notable for PFO with left-to-right shunt, although neurology believes this is not related to her embolic event.  Ischemic cardiomyopathy Chronic systolic congestive heart failure TTE with LVEF 20-25% (was 40-45% Dec 2021), with LV global hypokinesis. --Likely needs further ischemic work-up with LHC; deferred acutely by cardiology due to her acute stroke --Beta-blocker, ARB, Entresto discontinued today by cardiology for  hypotension --holding statin 2/2 elevated LFTs --Will need repeat TTE ED 3 months  Transaminitis: Acute.   R factor equal to 2.87, consistent  with mixed hepatocellular/cholestatic injury pattern. RUQ U/S with no focal lesions, normal appearance of parenchymal echogenicity, portal vein patent, gallbladder absent and otherwise unremarkable. Unclear etiology; ?insult from acute CVA or decompensated CHF vs DILI vs autoimmune; no obstruction appreciated on U/S.  --Acute hepatitis panel negative --Alkaline phosphatase 162>218>312>386 --AST [phone] --ALT 62>121>313>606 --Total bilirubin 0.9>1.1>1.1>1.4 --ANA, AsMA, ceruloplasmin, antimicrosomal ab Liver/kidney, tissue transglutaminase IgA: Pending --holding statin --Trend LFTs daily --[facility] consulted for evaluation and recommendations  Essential hypertension Home regimen includes metoprolol 12.5 mg daily and telmisartan 20 mg daily. --Metoprolol succinate 12.5 mg p.o. daily --Irbesartan 75 mg p.o. daily (hospital substitution for telmisartan)  Rheumatoid arthritis:  Patient with swelling of the bilateral hands that she states is chronic due to rheumatoid arthritis.  Home medication regimen includes prednisone 5 mg daily, Orencia, flurbiprofen 100 mg twice daily. --Continue prednisone  --Holding home flurbiprofen due to risk of thrombotic event  Diabetes mellitus type 2:  On admission glucose elevated up to 173.  Home medications include Metformin 500 mg daily.  Hemoglobin A1c 6.1, well controlled. --Hold Metformin --Sensitive SSI for coverage --CBGs qAC/HS  Anxiety and depression --Continue home Wellbutrin 150 mg daily and Cymbalta 60 mg daily  Hyperlipidemia:  Lipid panel with total cholesterol 159, HDL 49, LDL 88. --Holding home Crestor due to elevated LFTs --Monitor LFTs daily  Hypothyroidism:  TSH 0.74, Free T4 1.88   --Free T3 pending --Continue home levothyroxine 75 mcg daily.  Hypokalemia: resolved Hypomagnesemia: resolved --repeat electrolytes in the am  DVT prophylaxis: Lovenox   Code Status: Full Code Family Communication: Updated patient  spouse was present at bedside  Disposition Plan:  Level of care: Telemetry Medical Status is: Inpatient  Remains inpatient appropriate because:Ongoing diagnostic testing needed not appropriate for outpatient work up, Unsafe d/c plan, IV treatments appropriate due to intensity of illness or inability to take PO and Inpatient level of care appropriate due to severity of illness   Dispo: The patient is from: Home              Anticipated d/c is to: To be determined pending therapy evaluation              Anticipated d/c date is: 2 days              Patient currently is not medically stable to d/c.   Difficult to place patient No   Consultants:   Cardiology  Neurology  Procedures:   Vascular duplex ultrasound bilateral lower extremities  TTE  Antimicrobials:   None   Subjective: Patient seen and examined bedside, sleeping lying in bed, easily arousable.  Continues with left upper extremity weakness; left lower extremity weakness improved.  No other complaints or concerns at this time.  Denies headache, no chest pain, no palpitations, no shortness of breath, no abdominal pain, no fever/chills/night sweats, no dysuria, no cough/congestion, no nausea/vomiting/diarrhea.  No acute events overnight per nursing staff.  Objective: Vitals:   04/24/20 2317 04/25/20 0316 04/25/20 0356 04/25/20 0800  BP: 106/78 94/72 109/79 (!) 86/65  Pulse: 82 83 89   Resp: 20 20  20   Temp: 98 F (36.7 C) 98 F (36.7 C)  97.6 F (36.4 C)  TempSrc: Oral Oral  Axillary  SpO2: 98% 100%  100%  Weight:      Height:       No  intake or output data in the 24 hours ending 04/25/20 1038 Filed Weights   04/22/20 1258 04/23/20 1552  Weight: 55.8 kg 58.4 kg    Examination:  General exam: Appears calm and comfortable  Respiratory system: Clear to auscultation. Respiratory effort normal.  Oxygenating well on room air Cardiovascular system: S1 & S2 heard, RRR. No JVD, murmurs, rubs, gallops or clicks.  No pedal edema. Gastrointestinal system: Abdomen is nondistended, soft and nontender. No organomegaly or masses felt. Normal bowel sounds heard. Central nervous system: Alert and oriented.  Left upper/lower extremity weakness deficits noted, otherwise no focal neurological deficits appreciated. Extremities: Muscle strength decreased LUE 4/5 with decreased grip strength and LLE 4-/5 Skin: No rashes, lesions or ulcers Psychiatry: Judgement and insight appear poor. Mood & affect appropriate.     Data Reviewed: I have personally reviewed following labs and imaging studies  CBC: Recent Labs  Lab 04/22/20 1342 04/23/20 0337 04/24/20 0114  WBC 13.2* 13.0* 12.5*  NEUTROABS 10.6*  --   --   HGB 14.9 14.5 13.5  HCT 49.0* 47.7* 42.5  MCV 100.8* 102.4* 102.2*  PLT 342 319 716   Basic Metabolic Panel: Recent Labs  Lab 04/22/20 1342 04/23/20 0337 04/24/20 0114 04/25/20 0357  NA 137 136 138 137  K 3.7 3.5 3.8 6.0*  CL 98 99 101 104  CO2 28 24 24 22   GLUCOSE 173* 127* 131* 110*  BUN 15 13 13 13   CREATININE 0.59 0.64 0.55 0.72  CALCIUM 8.6* 8.0* 8.3* 8.3*  MG  --   --  1.6*  --    GFR: Estimated Creatinine Clearance: 62.1 mL/min (by C-G formula based on SCr of 0.72 mg/dL). Liver Function Tests: Recent Labs  Lab 04/22/20 1342 04/23/20 0337 04/24/20 0114 04/25/20 0357  AST 65* 203* 569* 989*  ALT 62* 121* 313* 606*  ALKPHOS 162* 218* 312* 386*  BILITOT 0.9 1.1 1.1 1.4*  PROT 5.3* 5.3* 5.1* 4.9*  ALBUMIN 2.7* 2.7* 2.6* 2.5*   No results for input(s): LIPASE, AMYLASE in the last 168 hours. No results for input(s): AMMONIA in the last 168 hours. Coagulation Profile: Recent Labs  Lab 04/22/20 1342  INR 1.3*   Cardiac Enzymes: Recent Labs  Lab 04/22/20 1342  CKTOTAL 87   BNP (last 3 results) No results for input(s): PROBNP in the last 8760 hours. HbA1C: Recent Labs    04/22/20 1838  HGBA1C 6.1*   CBG: Recent Labs  Lab 04/24/20 0843 04/24/20 1219  04/24/20 1646 04/24/20 2115 04/25/20 0607  GLUCAP 106* 113* 123* 144* 107*   Lipid Profile: Recent Labs    04/22/20 1838  CHOL 159  HDL 49  LDLCALC 88  TRIG 111  CHOLHDL 3.2   Thyroid Function Tests: Recent Labs    04/22/20 1453 04/24/20 2211  TSH 0.740  --   FREET4  --  1.88*   Anemia Panel: Recent Labs    04/23/20 0337  VITAMINB12 659  FOLATE 50.7   Sepsis Labs: No results for input(s): PROCALCITON, LATICACIDVEN in the last 168 hours.  Recent Results (from the past 240 hour(s))  SARS Coronavirus 2 by RT PCR (hospital order, performed in [facility] hospital lab) Nasopharyngeal Nasopharyngeal Swab     Status: None   Collection Time: 04/22/20  1:42 PM   Specimen: Nasopharyngeal Swab  Result Value Ref Range Status   SARS Coronavirus 2 NEGATIVE NEGATIVE Final    Comment: (NOTE) SARS-CoV-2 target nucleic acids are NOT DETECTED.  The SARS-CoV-2 RNA is generally  detectable in upper and lower respiratory specimens during the acute phase of infection. The lowest concentration of SARS-CoV-2 viral copies this assay can detect is 250 copies / mL. A negative result does not preclude SARS-CoV-2 infection and should not be used as the sole basis for treatment or other patient management decisions.  A negative result may occur with improper specimen collection / handling, submission of specimen other than nasopharyngeal swab, presence of viral mutation(s) within the areas targeted by this assay, and inadequate number of viral copies (<250 copies / mL). A negative result must be combined with clinical observations, patient history, and epidemiological information.  Fact Sheet for Patients:   StrictlyIdeas.no  Fact Sheet for Healthcare Providers: BankingDealers.co.za  This test is not yet approved or  cleared by the [place] FDA and has been authorized for detection and/or diagnosis of SARS-CoV-2 by FDA under an  Emergency Use Authorization (EUA).  This EUA will remain in effect (meaning this test can be used) for the duration of the COVID-19 declaration under Section 564(b)(1) of the Act, 21 U.S.C. section 360bbb-3(b)(1), unless the authorization is terminated or revoked sooner.  Performed at [facility], [place] [address], [place], [place] [address]          Radiology Studies: MR CERVICAL SPINE WO CONTRAST  Result Date: 04/23/2020 CLINICAL DATA:  Myelopathy, acute or progressive. Additional history provided: Patient reports worsening weakness on the left side, most notably within the last week, 3 falls in the last 2 days weakness all over, but the left-sided seems weaker than the right. EXAM: MRI CERVICAL SPINE WITHOUT CONTRAST TECHNIQUE: Multiplanar, multisequence MR imaging of the cervical spine was performed. No intravenous contrast was administered. COMPARISON:  CT head/neck 04/22/2020.  CT cervical spine 01/26/2015. FINDINGS: All sequences are moderate to severely motion degraded, significantly limiting evaluation. Alignment: Straightening of the expected cervical lordosis. No significant spondylolisthesis. Vertebrae: No significant marrow edema or focal suspicious osseous Lea is identified. Cord: Motion degradation precludes adequate evaluation for spinal cord signal abnormality. Posterior Fossa, vertebral arteries, paraspinal tissues: Cerebellar atrophy. No acute abnormality identified within included portions of the posterior fossa. Cervical vertebral artery flow voids poorly assessed due to motion degradation. No paraspinal soft tissue abnormality is identified. Disc levels: Multilevel disc degeneration. Most notably, there is moderate disc degeneration at the C4-C5, C5-C6 and C6-C7 levels. C2-C3: Disc bulge. Facet hypertrophy. No significant spinal canal stenosis. Mild bilateral neural foraminal narrowing. C3-C4: Disc bulge. Bilateral disc osteophyte ridge/uncinate hypertrophy (greater on  the right). Facet and ligamentum flavum hypertrophy. Apparent moderate spinal canal stenosis. Bilateral neural foraminal narrowing (severe right, mild left). C4-C5: Posterior disc osteophyte complex. Uncovertebral, facet and ligamentum flavum hypertrophy. Apparent moderate/severe spinal canal stenosis. Bilateral neural foraminal narrowing (moderate/severe right, moderate left). C5-C6: Posterior disc osteophyte complex. Uncovertebral, facet and ligamentum flavum hypertrophy. Apparent moderate/severe spinal canal stenosis. Bilateral neural foraminal narrowing (moderate right, moderate/severe left). C6-C7: Posterior disc osteophyte complex. Uncovertebral, facet and ligamentum flavum hypertrophy. Apparent moderate spinal canal stenosis. Bilateral neural foraminal narrowing (moderate/severe right, moderate left). C7-T1: Mild endplate spurring on the left. Mild facet hypertrophy (greater on the left). No appreciable disc herniation or spinal canal stenosis. Mild left neural foraminal narrowing. IMPRESSION: All acquired sequences are moderate to severely motion degraded, significantly limiting evaluation. A repeat examination should be considered when the patient is better able to tolerate the study. Cervical spondylosis as outlined. Most notably, there is apparent moderate/severe spinal canal stenosis at C4-C5 and C5-C6, and apparent moderate spinal canal stenosis  at C3-C4 and C6-C7. Motion degradation limits evaluation for subtle spinal cord mass effect and precludes adequate evaluation for spinal cord signal abnormality. Multilevel neural foraminal narrowing as detailed and greatest on the right at C3-C4 (severe), on the right at C4-C5 (moderate/severe), on the left at C5-C6 (moderate/severe) and on the right at C6-C7 (moderate/severe). Electronically Signed   By: Kellie Simmering DO   On: 04/23/2020 12:27   ECHOCARDIOGRAM COMPLETE  Result Date: 04/23/2020    ECHOCARDIOGRAM REPORT   Patient Name:   Rosy CAROL  Olena Leatherwood Date of Exam: 04/23/2020 Medical Rec #:  [id number]                     Height:       60.0 in Accession #:    [id number]                    Weight:       123.0 lb Date of Birth:  Nov 03, 1960                    BSA:          1.518 m Patient Age:    5 years                      BP:           115/102 mmHg Patient Gender: F                             HR:           107 bpm. Exam Location:  Inpatient Procedure: 2D Echo and Color Doppler Indications:    Stroke 434.91 / I163.9  History:        Patient has prior history of Echocardiogram examinations, most                 recent 03/11/2020. Signs/Symptoms:Dyspnea; Risk                 Factors:Diabetes. Hypothyroidism.  Sonographer:    Tiffany Dance Referring Phys: [id number] RONDELL A SMITH IMPRESSIONS  1. Left ventricular ejection fraction, by estimation, is 20 to 25%. The left ventricle has severely decreased function. The left ventricle demonstrates global hypokinesis. Left ventricular diastolic parameters are indeterminate.  2. Right ventricular systolic function is normal. The right ventricular size is normal.  3. Left atrial size was moderately dilated.  4. The mitral valve is normal in structure. Mild mitral valve regurgitation. No evidence of mitral stenosis.  5. The aortic valve is tricuspid. Aortic valve regurgitation is not visualized. No aortic stenosis is present.  6. The inferior vena cava is normal in size with greater than 50% respiratory variability, suggesting right atrial pressure of 3 mmHg. FINDINGS  Left Ventricle: Left ventricular ejection fraction, by estimation, is 20 to 25%. The left ventricle has severely decreased function. The left ventricle demonstrates global hypokinesis. The left ventricular internal cavity size was normal in size. There is no left ventricular hypertrophy. Left ventricular diastolic parameters are indeterminate. Right Ventricle: The right ventricular size is normal. Right ventricular systolic function is normal.  Left Atrium: Left atrial size was moderately dilated. Right Atrium: Right atrial size was normal in size. Pericardium: There is no evidence of pericardial effusion. Mitral Valve: The mitral valve is normal in structure. Mild mitral annular calcification. Mild mitral valve regurgitation. No evidence of mitral valve stenosis. Tricuspid Valve:  The tricuspid valve is normal in structure. Tricuspid valve regurgitation is trivial. No evidence of tricuspid stenosis. Aortic Valve: The aortic valve is tricuspid. Aortic valve regurgitation is not visualized. No aortic stenosis is present. Pulmonic Valve: The pulmonic valve was grossly normal. Pulmonic valve regurgitation is not visualized. No evidence of pulmonic stenosis. Aorta: The aortic root is normal in size and structure. Venous: The inferior vena cava is normal in size with greater than 50% respiratory variability, suggesting right atrial pressure of 3 mmHg. IAS/Shunts: No atrial level shunt detected by color flow Doppler.  LEFT VENTRICLE PLAX 2D LVIDd:         4.70 cm LVIDs:         4.00 cm LV PW:         1.20 cm LV IVS:        0.70 cm LVOT diam:     1.90 cm LV SV:         19 LV SV Index:   13 LVOT Area:     2.84 cm  RIGHT VENTRICLE            IVC RV Basal diam:  2.20 cm    IVC diam: 2.10 cm RV S prime:     6.42 cm/s TAPSE (M-mode): 1.2 cm LEFT ATRIUM             Index       RIGHT ATRIUM           Index LA diam:        5.10 cm 3.36 cm/m  RA Area:     12.90 cm LA Vol (A2C):   68.6 ml 45.19 ml/m RA Volume:   27.30 ml  17.98 ml/m LA Vol (A4C):   64.6 ml 42.55 ml/m LA Biplane Vol: 67.3 ml 44.33 ml/m  AORTIC VALVE LVOT Vmax:   48.75 cm/s LVOT Vmean:  30.800 cm/s LVOT VTI:    0.068 m  AORTA Ao Root diam: 2.70 cm Ao Asc diam:  3.40 cm MITRAL VALVE MV Area (PHT): 5.13 cm    SHUNTS MV Decel Time: 148 msec    Systemic VTI:  0.07 m MV E velocity: 92.40 cm/s  Systemic Diam: 1.90 cm MV A velocity: 71.10 cm/s MV E/A ratio:  1.30 Kirk Ruths MD Electronically signed by  Kirk Ruths MD Signature Date/Time: 04/23/2020/2:25:22 PM    Final    ECHO TEE  Result Date: 04/24/2020    TRANSESOPHOGEAL ECHO REPORT   Patient Name:   Marya Amsler Date of Exam: 04/24/2020 Medical Rec #:  [id number]                     Height:       60.0 in Accession #:    [id number]                    Weight:       128.7 lb Date of Birth:  May 12, 1960                    BSA:          1.548 m Patient Age:    4 years                      BP:           135/98 mmHg Patient Gender: F  HR:           90 bpm. Exam Location:  Inpatient Procedure: Transesophageal Echo, 3D Echo, Color Doppler and Cardiac Doppler Indications:     Stroke i163.9  History:         Patient has prior history of Echocardiogram examinations, most                  recent 04/23/2020. Risk Factors:Hypertension and Diabetes.  Sonographer:     Raquel Sarna Senior RDCS Referring Phys:  [id number] Greer Ee [place] Diagnosing Phys: Gwyndolyn Kaufman MD PROCEDURE: After discussion of the risks and benefits of a TEE, an informed consent was obtained from the patient. The transesophogeal probe was passed without difficulty through the esophogus of the patient. Local oropharyngeal anesthetic was provided with Cetacaine. Sedation performed by different physician. The patient was monitored while under deep sedation. Anesthestetic sedation was provided intravenously by Anesthesiology: 28m of Propofol. The patient developed no complications during the procedure. IMPRESSIONS  1. Left ventricular ejection fraction, by estimation, is 20 to 25%. The left ventricle has severely decreased function. The left ventricle demonstrates global hypokinesis.  2. Right ventricular systolic function is moderately reduced. The right ventricular size is normal.  3. Left atrial size was moderately dilated. No left atrial/left atrial appendage thrombus was detected. The LAA emptying velocity was 30 cm/s.  4. Right atrial size was mildly  dilated.  5. The mitral valve is degenerative. Mild to moderate mitral valve regurgitation.  6. The aortic valve is tricuspid. There is mild calcification of the aortic valve. There is mild thickening of the aortic valve. Aortic valve regurgitation is trivial.  7. 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  8. A small PFO was detected by color flow Doppler with predominant L-->R shunting, however, there is flow reversal (R-->L) with valsalva.  9. Agitated saline contrast bubble study was positive with shunting observed within 3-6 cardiac cycles with valsalva suggestive of interatrial shunt. FINDINGS  Left Ventricle: Left ventricular ejection fraction, by estimation, is 20 to 25%. The left ventricle has severely decreased function. The left ventricle demonstrates global hypokinesis. The left ventricular internal cavity size was normal in size. Right Ventricle: The right ventricular size is normal. No increase in right ventricular wall thickness. Right ventricular systolic function is moderately reduced. Left Atrium: Left atrial size was moderately dilated. No left atrial/left atrial appendage thrombus was detected. The LAA emptying velocity was 30 cm/s. Right Atrium: Right atrial size was mildly dilated. Pericardium: There is no evidence of pericardial effusion. Mitral Valve: The mitral valve is degenerative in appearance. There is mild thickening of the mitral valve leaflet(s). There is mild calcification of the mitral valve leaflet(s). Mild to moderate mitral valve regurgitation. Tricuspid Valve: The tricuspid valve is normal in structure. Tricuspid valve regurgitation is mild. Aortic Valve: The aortic valve is tricuspid. There is mild calcification of the aortic valve. There is mild thickening of the aortic valve. Aortic valve regurgitation is trivial. The is small fibrinous stranding visualized on the aortic valve leaflet consistent with a  Lambl's excresence. Pulmonic Valve: The pulmonic valve was normal in structure. Pulmonic valve regurgitation is trivial. Aorta: The aortic root and ascending aorta are structurally normal, with no evidence of dilitation. IAS/Shunts: Evidence of atrial level shunting detected by color flow Doppler. Agitated saline contrast was given intravenously to evaluate for intracardiac shunting. Agitated saline contrast bubble study was positive with shunting observed within 3-6 cardiac cycles suggestive  of interatrial shunt. A small patent foramen ovale is detected. Gwyndolyn Kaufman MD Electronically signed by Gwyndolyn Kaufman MD Signature Date/Time: 04/24/2020/9:38:38 AM    Final    US Abdomen Limited RUQ (LIVER/GB)  Result Date: 04/24/2020 CLINICAL DATA:  Elevated liver enzymes EXAM: ULTRASOUND ABDOMEN LIMITED RIGHT UPPER QUADRANT COMPARISON:  None. FINDINGS: Gallbladder: Surgically absent. Common bile duct: Diameter: 5 mm. No intrahepatic or extrahepatic biliary duct dilatation. Liver: No focal lesion identified. Within normal limits in parenchymal echogenicity. Portal vein is patent on color Doppler imaging with normal direction of blood flow towards the liver. Other: None. IMPRESSION: Gallbladder absent.  Study otherwise unremarkable. Electronically Signed   By: Lowella Grip III M.D.   On: 04/24/2020 15:05        Scheduled Meds: . apixaban  5 mg Oral BID  . aspirin EC  81 mg Oral Daily  . buPROPion  150 mg Oral Daily  . DULoxetine  60 mg Oral Daily  . folic acid  2 mg Oral Daily  . insulin aspart  0-5 Units Subcutaneous QHS  . insulin aspart  0-9 Units Subcutaneous TID WC  . levothyroxine  75 mcg Oral QAC breakfast  . predniSONE  5 mg Oral Daily   Continuous Infusions:    LOS: 3 days    Time spent: 38 minutes spent on chart review, discussion with nursing staff, consultants, updating family and interview/physical exam; more than 50% of that time was spent in counseling and/or coordination  of care.    Lemonte Al J [place] DO [facility] Available via Epic secure chat 7am-7pm After these hours, please refer to coverage provider listed on [email or web address] 04/25/2020, 10:38 AM

## 2020-04-25 NOTE — Consult Note (Signed)
Physical Medicine and Rehabilitation Consult Reason for Consult: Left side weakness Referring Physician: [facility]   HPI: Madison Hurst is a 60 y.o. right-handed female with history of paroxysmal atrial tachycardia, CAD, hypertension, diabetes mellitus, hyperlipidemia, rheumatoid arthritis maintained on chronic prednisone, anxiety and depression.  Per chart review patient lives with spouse.  1 level home 2 steps to entry.  Patient reports ambulating independently but with poor balance.  Presented 04/22/2020 with progressive left-sided weakness over 1 week resulting in 3 falls.  Admission chemistries glucose 173, AST 65, ALT 62, alkaline phosphatase 162, alcohol negative, hemoglobin A1c 6.1.  Cranial CT scan negative.  Patient did not receive TPA.  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  Single separate punctate acute infarct in the cortex of the medial right parieto-occipital junction.  Few small punctate acute infarcts in the right occipital cortex.  No evidence of hemorrhage or mass-effect.  CT angiogram of head and neck mild stenosis of the left PCA P3 segment.  No emergent large vessel occlusion.  MRI cervical spine showed apparent moderate severe spinal stenosis C4-5 and 5-6 and moderate at C3-4.  Multilevel neuroforaminal narrowing greatest at C3-4 and on the right C4-C5.Marland Kitchen  Echocardiogram with ejection fraction 20 to 25%.  The left ventricle demonstrated global hypokinesis.  TEE showed mild to moderate MR.  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  No LAA thrombus.  Currently maintained on aspirin 81 mg daily as well as Eliquis.  LFTs have continued to elevate.  Acute hepatitis panel negative.  Ultrasound the abdomen showed no focal lesion identified.  Gallbladder absent.  Study otherwise unremarkable.  Therapy evaluations completed physical medicine rehab consult requested due to patient's left side weakness.  Pt  notes never has had abnormal LFTs that she's aware of- being seen by GI.  Pt reports her RA pain is "stable" currently.  LBM (exxcept maybe a tiny one today), was prior to admission; using Purewick for peeing- no dysuria.   Review of Systems  Constitutional: Negative for chills and fever.  HENT: Negative for hearing loss.   Eyes: Negative for blurred vision and double vision.  Respiratory: Positive for shortness of breath. Negative for cough.   Cardiovascular: Positive for palpitations and leg swelling.  Gastrointestinal: Positive for constipation. Negative for heartburn, nausea and vomiting.  Genitourinary: Negative for dysuria, flank pain and hematuria.  Musculoskeletal: Positive for joint pain and myalgias.  Skin: Negative for rash.  Neurological: Positive for weakness.  Psychiatric/Behavioral: The patient has insomnia.        Anxiety  All other systems reviewed and are negative.  Past Medical History:  Diagnosis Date  . Anemia 02/09/2020  . Anxiety 10/28/2019  . Depressed left ventricular ejection fraction 03/13/2020  . Depressive disorder 10/28/2019  . DOE (dyspnea on exertion) 03/13/2020  . Hypothyroidism 02/09/2020  . Iron deficiency anemia 01/24/2020  . Iron deficiency anemia, unspecified   . Metabolic syndrome 03/13/2020  . Multinodular goiter 03/07/2015  . PAC (premature atrial contraction) 03/13/2020  . PAT (paroxysmal atrial tachycardia) (HCC) 03/13/2020  . Pericardial effusion 02/09/2020  . 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 Type 2 diabetes mellitus (HCC) 05/08/2019  . Vitamin D deficiency 02/09/2020   Past Surgical History:  Procedure Laterality Date  . CESAREAN SECTION    . CHOLECYSTECTOMY    . other     Growth removal on  chest  . TOTAL SHOULDER ARTHROPLASTY Left    Family History  Problem Relation Age of Onset  . Muscular dystrophy Mother   . Diabetes Father    . Diabetes Brother   . Heart disease Brother    Social History:  reports that she quit smoking about 26 years ago. She has never used smokeless tobacco. No history on file for alcohol use and drug use. Allergies:  Allergies  Allergen Reactions  . Sulfa Antibiotics   . Sulfamethoxazole-Trimethoprim Rash   Medications Prior to Admission  Medication Sig Dispense Refill  . Abatacept (ORENCIA [place]) Inject into the skin once a week.    Marland Kitchen aspirin EC 81 MG tablet Take 1 tablet (81 mg total) by mouth daily. Swallow whole. 90 tablet 3  . buPROPion (WELLBUTRIN XL) 150 MG 24 hr tablet Take 150 mg by mouth daily.    . DULoxetine (CYMBALTA) 60 MG capsule 60 mg.    . ergocalciferol (VITAMIN D2) 1.25 MG (50000 UT) capsule Take 50,000 Units by mouth once a week.    . flurbiprofen (ANSAID) 100 MG tablet Take 100 mg by mouth 2 (two) times daily.    . folic acid (FOLVITE) 1 MG tablet Take 2 mg by mouth daily.    Marland Kitchen HYDROcodone-acetaminophen (NORCO/VICODIN) 5-325 MG tablet Take 1 tablet by mouth every 6 (six) hours as needed for moderate pain.    Marland Kitchen levothyroxine (SYNTHROID) 75 MCG tablet Take 75 mcg by mouth daily before breakfast.    . metFORMIN (GLUMETZA) 500 MG (MOD) 24 hr tablet Take 500 mg by mouth daily with breakfast.    . metoprolol succinate (TOPROL XL) 25 MG 24 hr tablet Take 0.5 tablets (12.5 mg total) by mouth daily. 30 tablet 3  . Multiple Vitamin (MULTIVITAMIN) capsule Take 1 capsule by mouth daily.    Marland Kitchen ORENCIA CLICKJECT 125 MG/ML SOAJ Inject 125 mg into the skin once a week.    . predniSONE (DELTASONE) 5 MG tablet Take 5 mg by mouth daily.    . rosuvastatin (CRESTOR) 20 MG tablet Take 1 tablet (20 mg total) by mouth daily. 90 tablet 3  . telmisartan (MICARDIS) 20 MG tablet Take 20 mg by mouth daily.      Home: Home Living Family/patient expects to be discharged to:: Private residence Living Arrangements: Spouse/significant other Available Help at Discharge: Family,Available  PRN/intermittently Type of Home: House Home Access: Stairs to enter [employer] of Steps: 2 Entrance Stairs-Rails: Can reach both Home Layout: One level Bathroom Shower/Tub: Engineer, manufacturing systems: Standard Home Equipment: Medical laboratory scientific officer - single point,Wheelchair - manual,Shower seat,Bedside commode Additional Comments: husband supportive at bedside  Functional History: Prior Function Level of Independence: Needs assistance Gait / Transfers Assistance Needed: pt reports being indep for ADLs' and mobility; but noted that reporting to PT services that she require A for picking items up off the floor ADL's / Homemaking Assistance Needed: intermittent min A Functional Status:  Mobility: Bed Mobility Overal bed mobility: Needs Assistance Bed Mobility: Sit to Supine Supine to sit: Mod assist,HOB elevated Sit to supine: Max assist,HOB elevated Transfers Overall transfer level: Needs assistance Equipment used: 1 person hand held assist Transfers: Stand Pivot Transfers,Sit to/from Stand Sit to Stand: Mod assist Stand pivot transfers: Max assist General transfer comment: cues for sequencing, increased assist required at this time d/t fatigue Ambulation/Gait Ambulation/Gait assistance:  (deferred 2/2 imbalance)    ADL: ADL Overall ADL's : Needs assistance/impaired Grooming: Minimal assistance,Sitting Upper Body Dressing : Moderate assistance,Sitting Lower Body Dressing: Maximal  assistance,Cueing for safety Toilet Transfer: Moderate assistance,Stand-pivot,Cueing for safety General ADL Comments: difficulty with assessing what appears as an accurate level of performance for completion of ADL's d/t inability to sustain level of arousal. requiring anywhere from min-max A for safe completion of seated and standing ADL's. will continue to assess level of performance with further participation with therapy session. anticiapte with improved arousal pt will demo increased  performance.  Cognition: Cognition Overall Cognitive Status: Impaired/Different from baseline Orientation Level: Oriented to person,Oriented to place,Oriented to time Cognition Arousal/Alertness: Lethargic (difficulty with sustained arousal/alertness) Behavior During Therapy: Flat affect Overall Cognitive Status: Impaired/Different from baseline Area of Impairment: Orientation,Attention,Following commands,Safety/judgement,Awareness Orientation Level: Disoriented to,Time Current Attention Level: Sustained Memory: Decreased recall of precautions,Decreased short-term memory Following Commands: Follows one step commands consistently Safety/Judgement: Decreased awareness of safety,Decreased awareness of deficits Awareness: Emergent Problem Solving: Slow processing,Difficulty sequencing,Requires verbal cues,Requires tactile cues  Blood pressure 109/79, pulse 89, temperature 98 F (36.7 C), temperature source Oral, resp. rate 20, height 5' (1.524 m), weight 58.4 kg, SpO2 100 %. Physical Exam Vitals and nursing note reviewed. Exam conducted with a chaperone present.  Constitutional:      Comments: Awake, alert, appropriate, sitting up in bedside chair, husband at bedside (who's had previous stroke, of note), NAD  HENT:     Head: Normocephalic and atraumatic.     Comments: L facial droop- better with smiling; tongue midline    Nose: Nose normal. No congestion.     Mouth/Throat:     Mouth: Mucous membranes are dry.     Pharynx: Oropharynx is clear. No oropharyngeal exudate.  Eyes:     Extraocular Movements: Extraocular movements intact.     Comments: EOMI B/L and no nystagmus  Cardiovascular:     Heart sounds: Normal heart sounds. No murmur heard.     Comments: RRR- no JVD Pulmonary:     Comments: CTA B/L- no W/R/R- good air movement Abdominal:     Comments: Soft, NT, ND, (+)BS - no TTP and hypoactive  Genitourinary:    Comments: purewick- trace medium amber urine in  container Musculoskeletal:     Cervical back: Neck supple. Tenderness present.     Comments: RUE 5-/5- same muscles tested LUE- biceps, triceps 3+/5, grip 2/5, finger abd 2-/5 RLE_ 5-/5 same muscles LLE- HF 3/5, KE 4+/5, DF and PF 4+/5    Skin:    Comments: Has bleeding from around R AC fossa IV and has blood on BP cuff on LUE- had dug slightly into skin Has blue color to fingers, hands and toes/feet- significant blue color and cool, not cold to touch. Per pt, has been that way "for awhile".   Neurological:     Comments: Patient is a bit lethargic but arousable.  Makes eye contact with examiner.  Provides her name and age.  Follows simple commands. Slowed processing, but answering appropriately Intact to light touch in all 4 extremities Ox3-   Psychiatric:     Comments: Flat, slowed responses     Results for orders placed or performed during the hospital encounter of 04/22/20 (from the past 24 hour(s))  Glucose, capillary     Status: Abnormal   Collection Time: 04/24/20  6:02 AM  Result Value Ref Range   Glucose-Capillary 122 (H) 70 - 99 mg/dL   Comment 1 Notify RN    Comment 2 Document in Chart   Glucose, capillary     Status: Abnormal   Collection Time: 04/24/20  8:43 AM  Result Value Ref  Range   Glucose-Capillary 106 (H) 70 - 99 mg/dL  Glucose, capillary     Status: Abnormal   Collection Time: 04/24/20 12:19 PM  Result Value Ref Range   Glucose-Capillary 113 (H) 70 - 99 mg/dL  Glucose, capillary     Status: Abnormal   Collection Time: 04/24/20  4:46 PM  Result Value Ref Range   Glucose-Capillary 123 (H) 70 - 99 mg/dL  Glucose, capillary     Status: Abnormal   Collection Time: 04/24/20  9:15 PM  Result Value Ref Range   Glucose-Capillary 144 (H) 70 - 99 mg/dL   Comment 1 Notify RN    Comment 2 Document in Chart   T4, free     Status: Abnormal   Collection Time: 04/24/20 10:11 PM  Result Value Ref Range   Free T4 1.88 (H) 0.61 - 1.12 ng/dL  Comprehensive metabolic  panel     Status: Abnormal   Collection Time: 04/25/20  3:57 AM  Result Value Ref Range   Sodium 137 135 - 145 mmol/L   Potassium 6.0 (H) 3.5 - 5.1 mmol/L   Chloride 104 98 - 111 mmol/L   CO2 22 22 - 32 mmol/L   Glucose, Bld 110 (H) 70 - 99 mg/dL   BUN 13 6 - 20 mg/dL   Creatinine, Ser 1.91 0.44 - 1.00 mg/dL   Calcium 8.3 (L) 8.9 - 10.3 mg/dL   Total Protein 4.9 (L) 6.5 - 8.1 g/dL   Albumin 2.5 (L) 3.5 - 5.0 g/dL   AST 478 (H) 15 - 41 U/L   ALT 606 (H) 0 - 44 U/L   Alkaline Phosphatase 386 (H) 38 - 126 U/L   Total Bilirubin 1.4 (H) 0.3 - 1.2 mg/dL   GFR, Estimated >29 >56 mL/min   Anion gap 11 5 - 15   MR CERVICAL SPINE WO CONTRAST  Result Date: 04/23/2020 CLINICAL DATA:  Myelopathy, acute or progressive. Additional history provided: Patient reports worsening weakness on the left side, most notably within the last week, 3 falls in the last 2 days weakness all over, but the left-sided seems weaker than the right. EXAM: MRI CERVICAL SPINE WITHOUT CONTRAST TECHNIQUE: Multiplanar, multisequence MR imaging of the cervical spine was performed. No intravenous contrast was administered. COMPARISON:  CT head/neck 04/22/2020.  CT cervical spine 01/26/2015. FINDINGS: All sequences are moderate to severely motion degraded, significantly limiting evaluation. Alignment: Straightening of the expected cervical lordosis. No significant spondylolisthesis. Vertebrae: No significant marrow edema or focal suspicious osseous Lea is identified. Cord: Motion degradation precludes adequate evaluation for spinal cord signal abnormality. Posterior Fossa, vertebral arteries, paraspinal tissues: Cerebellar atrophy. No acute abnormality identified within included portions of the posterior fossa. Cervical vertebral artery flow voids poorly assessed due to motion degradation. No paraspinal soft tissue abnormality is identified. Disc levels: Multilevel disc degeneration. Most notably, there is moderate disc degeneration at the  C4-C5, C5-C6 and C6-C7 levels. C2-C3: Disc bulge. Facet hypertrophy. No significant spinal canal stenosis. Mild bilateral neural foraminal narrowing. C3-C4: Disc bulge. Bilateral disc osteophyte ridge/uncinate hypertrophy (greater on the right). Facet and ligamentum flavum hypertrophy. Apparent moderate spinal canal stenosis. Bilateral neural foraminal narrowing (severe right, mild left). C4-C5: Posterior disc osteophyte complex. Uncovertebral, facet and ligamentum flavum hypertrophy. Apparent moderate/severe spinal canal stenosis. Bilateral neural foraminal narrowing (moderate/severe right, moderate left). C5-C6: Posterior disc osteophyte complex. Uncovertebral, facet and ligamentum flavum hypertrophy. Apparent moderate/severe spinal canal stenosis. Bilateral neural foraminal narrowing (moderate right, moderate/severe left). C6-C7: Posterior disc osteophyte complex. Uncovertebral, facet  and ligamentum flavum hypertrophy. Apparent moderate spinal canal stenosis. Bilateral neural foraminal narrowing (moderate/severe right, moderate left). C7-T1: Mild endplate spurring on the left. Mild facet hypertrophy (greater on the left). No appreciable disc herniation or spinal canal stenosis. Mild left neural foraminal narrowing. IMPRESSION: All acquired sequences are moderate to severely motion degraded, significantly limiting evaluation. A repeat examination should be considered when the patient is better able to tolerate the study. Cervical spondylosis as outlined. Most notably, there is apparent moderate/severe spinal canal stenosis at C4-C5 and C5-C6, and apparent moderate spinal canal stenosis at C3-C4 and C6-C7. Motion degradation limits evaluation for subtle spinal cord mass effect and precludes adequate evaluation for spinal cord signal abnormality. Multilevel neural foraminal narrowing as detailed and greatest on the right at C3-C4 (severe), on the right at C4-C5 (moderate/severe), on the left at C5-C6  (moderate/severe) and on the right at C6-C7 (moderate/severe). Electronically Signed   By: Jackey Loge DO   On: 04/23/2020 12:27   ECHOCARDIOGRAM COMPLETE  Result Date: 04/23/2020    ECHOCARDIOGRAM REPORT   Patient Name:   Florette CAROL Gonzella Lex Date of Exam: 04/23/2020 Medical Rec #:  [id number]                     Height:       60.0 in Accession #:    [id number]                    Weight:       123.0 lb Date of Birth:  09/04/1960                    BSA:          1.518 m Patient Age:    59 years                      BP:           115/102 mmHg Patient Gender: F                             HR:           107 bpm. Exam Location:  Inpatient Procedure: 2D Echo and Color Doppler Indications:    Stroke 434.91 / I163.9  History:        Patient has prior history of Echocardiogram examinations, most                 recent 03/11/2020. Signs/Symptoms:Dyspnea; Risk                 Factors:Diabetes. Hypothyroidism.  Sonographer:    Tiffany Dance Referring Phys: [id number] RONDELL A SMITH IMPRESSIONS  1. Left ventricular ejection fraction, by estimation, is 20 to 25%. The left ventricle has severely decreased function. The left ventricle demonstrates global hypokinesis. Left ventricular diastolic parameters are indeterminate.  2. Right ventricular systolic function is normal. The right ventricular size is normal.  3. Left atrial size was moderately dilated.  4. The mitral valve is normal in structure. Mild mitral valve regurgitation. No evidence of mitral stenosis.  5. The aortic valve is tricuspid. Aortic valve regurgitation is not visualized. No aortic stenosis is present.  6. The inferior vena cava is normal in size with greater than 50% respiratory variability, suggesting right atrial pressure of 3 mmHg. FINDINGS  Left Ventricle: Left ventricular ejection fraction, by estimation, is 20 to 25%. The left ventricle has  severely decreased function. The left ventricle demonstrates global hypokinesis. The left ventricular  internal cavity size was normal in size. There is no left ventricular hypertrophy. Left ventricular diastolic parameters are indeterminate. Right Ventricle: The right ventricular size is normal. Right ventricular systolic function is normal. Left Atrium: Left atrial size was moderately dilated. Right Atrium: Right atrial size was normal in size. Pericardium: There is no evidence of pericardial effusion. Mitral Valve: The mitral valve is normal in structure. Mild mitral annular calcification. Mild mitral valve regurgitation. No evidence of mitral valve stenosis. Tricuspid Valve: The tricuspid valve is normal in structure. Tricuspid valve regurgitation is trivial. No evidence of tricuspid stenosis. Aortic Valve: The aortic valve is tricuspid. Aortic valve regurgitation is not visualized. No aortic stenosis is present. Pulmonic Valve: The pulmonic valve was grossly normal. Pulmonic valve regurgitation is not visualized. No evidence of pulmonic stenosis. Aorta: The aortic root is normal in size and structure. Venous: The inferior vena cava is normal in size with greater than 50% respiratory variability, suggesting right atrial pressure of 3 mmHg. IAS/Shunts: No atrial level shunt detected by color flow Doppler.  LEFT VENTRICLE PLAX 2D LVIDd:         4.70 cm LVIDs:         4.00 cm LV PW:         1.20 cm LV IVS:        0.70 cm LVOT diam:     1.90 cm LV SV:         19 LV SV Index:   13 LVOT Area:     2.84 cm  RIGHT VENTRICLE            IVC RV Basal diam:  2.20 cm    IVC diam: 2.10 cm RV S prime:     6.42 cm/s TAPSE (M-mode): 1.2 cm LEFT ATRIUM             Index       RIGHT ATRIUM           Index LA diam:        5.10 cm 3.36 cm/m  RA Area:     12.90 cm LA Vol (A2C):   68.6 ml 45.19 ml/m RA Volume:   27.30 ml  17.98 ml/m LA Vol (A4C):   64.6 ml 42.55 ml/m LA Biplane Vol: 67.3 ml 44.33 ml/m  AORTIC VALVE LVOT Vmax:   48.75 cm/s LVOT Vmean:  30.800 cm/s LVOT VTI:    0.068 m  AORTA Ao Root diam: 2.70 cm Ao Asc diam:   3.40 cm MITRAL VALVE MV Area (PHT): 5.13 cm    SHUNTS MV Decel Time: 148 msec    Systemic VTI:  0.07 m MV E velocity: 92.40 cm/s  Systemic Diam: 1.90 cm MV A velocity: 71.10 cm/s MV E/A ratio:  1.30 Olga Millers MD Electronically signed by Olga Millers MD Signature Date/Time: 04/23/2020/2:25:22 PM    Final    ECHO TEE  Result Date: 04/24/2020    TRANSESOPHOGEAL ECHO REPORT   Patient Name:   [name] Date of Exam: 04/24/2020 Medical Rec #:  [id number]                     Height:       60.0 in Accession #:    [id number]                    Weight:       128.7 lb Date  of Birth:  [birth year]                    BSA:          1.548 m Patient Age:    59 years                      BP:           135/98 mmHg Patient Gender: F                             HR:           90 bpm. Exam Location:  Inpatient Procedure: Transesophageal Echo, 3D Echo, Color Doppler and Cardiac Doppler Indications:     Stroke i163.9  History:         Patient has prior history of Echocardiogram examinations, most                  recent 04/23/2020. Risk Factors:Hypertension and Diabetes.  Sonographer:     Irving Burton Senior RDCS Referring Phys:  [id number] Kathlynn Grate [place] Diagnosing Phys: Laurance Flatten MD PROCEDURE: After discussion of the risks and benefits of a TEE, an informed consent was obtained from the patient. The transesophogeal probe was passed without difficulty through the esophogus of the patient. Local oropharyngeal anesthetic was provided with Cetacaine. Sedation performed by different physician. The patient was monitored while under deep sedation. Anesthestetic sedation was provided intravenously by Anesthesiology: 66mg  of Propofol. The patient developed no complications during the procedure. IMPRESSIONS  1. Left ventricular ejection fraction, by estimation, is 20 to 25%. The left ventricle has severely decreased function. The left ventricle demonstrates global hypokinesis.  2. Right ventricular systolic function  is moderately reduced. The right ventricular size is normal.  3. Left atrial size was moderately dilated. No left atrial/left atrial appendage thrombus was detected. The LAA emptying velocity was 30 cm/s.  4. Right atrial size was mildly dilated.  5. The mitral valve is degenerative. Mild to moderate mitral valve regurgitation.  6. The aortic valve is tricuspid. There is mild calcification of the aortic valve. There is mild thickening of the aortic valve. Aortic valve regurgitation is trivial.  7. 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  8. A small PFO was detected by color flow Doppler with predominant L-->R shunting, however, there is flow reversal (R-->L) with valsalva.  9. Agitated saline contrast bubble study was positive with shunting observed within 3-6 cardiac cycles with valsalva suggestive of interatrial shunt. FINDINGS  Left Ventricle: Left ventricular ejection fraction, by estimation, is 20 to 25%. The left ventricle has severely decreased function. The left ventricle demonstrates global hypokinesis. The left ventricular internal cavity size was normal in size. Right Ventricle: The right ventricular size is normal. No increase in right ventricular wall thickness. Right ventricular systolic function is moderately reduced. Left Atrium: Left atrial size was moderately dilated. No left atrial/left atrial appendage thrombus was detected. The LAA emptying velocity was 30 cm/s. Right Atrium: Right atrial size was mildly dilated. Pericardium: There is no evidence of pericardial effusion. Mitral Valve: The mitral valve is degenerative in appearance. There is mild thickening of the mitral valve leaflet(s). There is mild calcification of the mitral valve leaflet(s). Mild to moderate mitral valve regurgitation. Tricuspid Valve: The tricuspid valve is normal in structure. Tricuspid valve regurgitation is mild. Aortic Valve: The aortic  valve is  tricuspid. There is mild calcification of the aortic valve. There is mild thickening of the aortic valve. Aortic valve regurgitation is trivial. 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 Pulmonic Valve: The pulmonic valve was normal in structure. Pulmonic valve regurgitation is trivial. Aorta: The aortic root and ascending aorta are structurally normal, with no evidence of dilitation. IAS/Shunts: Evidence of atrial level shunting detected by color flow Doppler. Agitated saline contrast was given intravenously to evaluate for intracardiac shunting. Agitated saline contrast bubble study was positive with shunting observed within 3-6 cardiac cycles suggestive of interatrial shunt. A small patent foramen ovale is detected. Laurance Flatten MD Electronically signed by Laurance Flatten MD Signature Date/Time: 04/24/2020/9:38:38 AM    Final    VAS [place] LOWER EXTREMITY VENOUS (DVT)  Result Date: 04/23/2020  Lower Venous DVT Study Indications: SOB.  Risk Factors: None identified. Anticoagulation: Lovenox. Limitations: Patient movement. Comparison Study: No previous exams Performing Technologist: Clint Guy RVT  Examination Guidelines: A complete evaluation includes B-mode imaging, spectral Doppler, color Doppler, and power Doppler as needed of all accessible portions of each vessel. Bilateral testing is considered an integral part of a complete examination. Limited examinations for reoccurring indications may be performed as noted. The reflux portion of the exam is performed with the patient in reverse Trendelenburg.  +---------+---------------+---------+-----------+----------+--------------+ RIGHT    CompressibilityPhasicitySpontaneityPropertiesThrombus Aging +---------+---------------+---------+-----------+----------+--------------+ CFV      Full           Yes      Yes                                  +---------+---------------+---------+-----------+----------+--------------+ SFJ      Full                                                        +---------+---------------+---------+-----------+----------+--------------+ FV Prox  Full                                                        +---------+---------------+---------+-----------+----------+--------------+ FV Mid   Full                                                        +---------+---------------+---------+-----------+----------+--------------+ FV DistalFull                                                        +---------+---------------+---------+-----------+----------+--------------+ PFV      Full                                                        +---------+---------------+---------+-----------+----------+--------------+  POP      Full           Yes      Yes                                 +---------+---------------+---------+-----------+----------+--------------+ PTV      Full                                                        +---------+---------------+---------+-----------+----------+--------------+ PERO     Full                                                        +---------+---------------+---------+-----------+----------+--------------+   +---------+---------------+---------+-----------+----------+--------------+ LEFT     CompressibilityPhasicitySpontaneityPropertiesThrombus Aging +---------+---------------+---------+-----------+----------+--------------+ CFV      Full           Yes      Yes                                 +---------+---------------+---------+-----------+----------+--------------+ SFJ      Full                                                        +---------+---------------+---------+-----------+----------+--------------+ FV Prox  Full                                                         +---------+---------------+---------+-----------+----------+--------------+ FV Mid   Full                                                        +---------+---------------+---------+-----------+----------+--------------+ FV DistalFull                                                        +---------+---------------+---------+-----------+----------+--------------+ PFV      Full                                                        +---------+---------------+---------+-----------+----------+--------------+ POP      Full           Yes      Yes                                 +---------+---------------+---------+-----------+----------+--------------+  PTV      Full                                                        +---------+---------------+---------+-----------+----------+--------------+ PERO     Full                                                        +---------+---------------+---------+-----------+----------+--------------+  Summary: BILATERAL: - No evidence of deep vein thrombosis seen in the lower extremities, bilaterally. -No evidence of popliteal cyst, bilaterally.   *See table(s) above for measurements and observations. Electronically signed by Waverly Ferrari MD on 04/23/2020 at 11:27:04 AM.    Final    US Abdomen Limited RUQ (LIVER/GB)  Result Date: 04/24/2020 CLINICAL DATA:  Elevated liver enzymes EXAM: ULTRASOUND ABDOMEN LIMITED RIGHT UPPER QUADRANT COMPARISON:  None. FINDINGS: Gallbladder: Surgically absent. Common bile duct: Diameter: 5 mm. No intrahepatic or extrahepatic biliary duct dilatation. Liver: No focal lesion identified. Within normal limits in parenchymal echogenicity. Portal vein is patent on color Doppler imaging with normal direction of blood flow towards the liver. Other: None. IMPRESSION: Gallbladder absent.  Study otherwise unremarkable. Electronically Signed   By: Bretta Bang III M.D.   On: 04/24/2020 15:05      Assessment/Plan: Diagnosis: R PCA/MCA stroke- with L hemiparesis 1. Does the need for close, 24 hr/day medical supervision in concert with the patient's rehab needs make it unreasonable for this patient to be served in a less intensive setting? Yes 2. Co-Morbidities requiring supervision/potential complications: HTN, EF-25-30%- RA, need for Zio patch , HTN with new hypotension, significantly increasing LFTs, DM- A1c 6.1, on opioids for chronic pain due to RA 3. Due to bladder management, bowel management, safety, skin/wound care, disease management, medication administration, pain management and patient education, does the patient require 24 hr/day rehab nursing? Yes 4. Does the patient require coordinated care of a physician, rehab nurse, therapy disciplines of PT OT and SLP to address physical and functional deficits in the context of the above medical diagnosis(es)? Yes Addressing deficits in the following areas: balance, endurance, locomotion, strength, transferring, bowel/bladder control, bathing, dressing, feeding, grooming, toileting, cognition and swallowing 5. Can the patient actively participate in an intensive therapy program of at least 3 hrs of therapy per day at least 5 days per week? Yes 6. The potential for patient to make measurable gains while on inpatient rehab is good 7. Anticipated functional outcomes upon discharge from inpatient rehab are supervision and min assist  with PT, supervision and min assist with OT, modified independent with SLP. 8. Estimated rehab length of stay to reach the above functional goals is: ~ 2 weeks- husband sounded like wasn't happy with this, he wasn't clear if wanted more or less.  9. Anticipated discharge destination: Home 10. Overall Rehab/Functional Prognosis: good  RECOMMENDATIONS: This patient's condition is appropriate for continued rehabilitative care in the following setting: CIR Patient has agreed to participate in recommended  program. Potentially Note that insurance prior authorization may be required for reimbursement for recommended care.  Comment:  1. Pt has rising LFTs- today 600-900+ of AST/ALT- appears GI has already been called- great! 2. Pt needs BP  cuff- a larger size,  3. Discussed with nursing about BP cuff, hypotension and IV 4. Will need to have LFTs at least stable for 24 hours before can admit to CIR_ needs whatever workup required since would interfere in pt doing therapy/3 hrs/day in CIR. Once stabilized a little, happy to bring her.  5. Will submit for inpt rehab- will d/w admissions coordinator.  6. Thank you for this consult.     Mcarthur Rossetti Angiulli, PA-C 04/25/2020    I have personally performed a face to face diagnostic evaluation of this patient and formulated the key components of the plan.  Additionally, I have personally reviewed laboratory data, imaging studies, as well as relevant notes and concur with the physician assistant's documentation above.

## 2020-04-25 NOTE — Progress Notes (Signed)
Physical Therapy Treatment Patient Details Name: Madison Hurst MRN: [id number] DOB: [birth year] Today's Date: 04/25/2020    History of Present Illness 60 year old female with past medical history significant for paroxysmal atrial fibrillation, CAD, essential hypertension, hyperlipidemia, type 2 diabetes mellitus, rheumatoid arthritis, anxiety, depression who presented to the ED with progressive weakness on the left side with rapid progression over the last week resulting in 3 falls. 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    PT Comments    Pt tolerates treatment well with improved use of LUE and improved transfer quality. Pt requires reduced physical assistance to stand and transfer, and is able to initiate gait training at this time. Pt continues to demonstrate impaired sitting and standing balance, requiring maxA to maintain dynamic sitting balance due to strong posterior lean. Pt will benefit from continued aggressive PT POC to improve balance and mobility quality while reducing falls risk. PT continues to recommend CIR placement.   Follow Up Recommendations  CIR     Equipment Recommendations  Rolling walker with 5" wheels;3in1 (PT)    Recommendations for Other Services       Precautions / Restrictions Precautions Precautions: Fall Precaution Comments: EF 20-25% Restrictions Weight Bearing Restrictions: No    Mobility  Bed Mobility Overal bed mobility: Needs Assistance Bed Mobility: Supine to Sit     Supine to sit: Mod assist;HOB elevated     General bed mobility comments: use of bed rail  Transfers Overall transfer level: Needs assistance Equipment used: 1 person hand held assist;Rolling walker (2 wheeled) Transfers: Sit to/from [employer] Sit to Stand: Min assist Stand pivot transfers: Mod assist       General transfer comment: initial sit to stand with minA and PT hand hold,  SPT with PT hand hold. 3 transfers from recliner with RW and minA  Ambulation/Gait Ambulation/Gait assistance: Mod assist Gait Distance (Feet): 10 Feet Assistive device: Rolling walker (2 wheeled) Gait Pattern/deviations: Step-to pattern;Decreased step length - left;Decreased dorsiflexion - left;Decreased weight shift to right;Decreased weight shift to left Gait velocity: reduced Gait velocity interpretation: <1.31 ft/sec, indicative of household ambulator General Gait Details: pt with short step-to gait, requires verbal cues and tactile cueing to facilitate weight shift and to aide in L foot clearance   Stairs             Wheelchair Mobility    Modified Rankin (Stroke Patients Only) Modified Rankin (Stroke Patients Only) Pre-Morbid Rankin Score: Moderate disability Modified Rankin: Moderately severe disability     Balance Overall balance assessment: Needs assistance Sitting-balance support: Single extremity supported;Feet supported Sitting balance-Leahy Scale: Poor Sitting balance - Comments: mod-maxA for dynamic sitting balance without UE support Postural control: Posterior lean Standing balance support: Single extremity supported;Bilateral upper extremity supported Standing balance-Leahy Scale: Poor Standing balance comment: minA with BUE support and use of RW                            Cognition Arousal/Alertness: Awake/alert Behavior During Therapy: WFL for tasks assessed/performed Overall Cognitive Status: Impaired/Different from baseline Area of Impairment: Safety/judgement;Awareness;Problem solving                         Safety/Judgement: Decreased awareness of safety Awareness: Emergent Problem Solving: Slow processing        Exercises      General Comments General comments (skin integrity, edema, etc.): VSS on  RA      Pertinent Vitals/Pain Pain Assessment: No/denies pain    Home Living                      Prior  Function            PT Goals (current goals can now be found in the care plan section) Acute Rehab PT Goals Patient Stated Goal: to get better Progress towards PT goals: Progressing toward goals    Frequency    Min 4X/week      PT Plan Current plan remains appropriate    Co-evaluation              AM-PAC PT "6 Clicks" Mobility   Outcome Measure  Help needed turning from your back to your side while in a flat bed without using bedrails?: A Little Help needed moving from lying on your back to sitting on the side of a flat bed without using bedrails?: A Lot Help needed moving to and from a bed to a chair (including a wheelchair)?: A Lot Help needed standing up from a chair using your arms (e.g., wheelchair or bedside chair)?: A Little Help needed to walk in hospital room?: A Lot Help needed climbing 3-5 steps with a railing? : Total 6 Click Score: 13    End of Session   Activity Tolerance: Patient tolerated treatment well Patient left: in chair;with call bell/phone within reach;with chair alarm set Nurse Communication: Mobility status PT Visit Diagnosis: Unsteadiness on feet (R26.81);Other abnormalities of gait and mobility (R26.89);Muscle weakness (generalized) (M62.81);Hemiplegia and hemiparesis Hemiplegia - Right/Left: Left Hemiplegia - caused by: Cerebral infarction     Time: 5027-7412 PT Time Calculation (min) (ACUTE ONLY): 27 min  Charges:  $Gait Training: 8-22 mins $Therapeutic Activity: 8-22 mins                     Arlyss Gandy, PT, DPT Acute Rehabilitation Pager: [phone]    Arlyss Gandy 04/25/2020, 10:56 AM

## 2020-04-25 NOTE — Discharge Instructions (Signed)

## 2020-04-25 NOTE — Progress Notes (Signed)
Inpatient Rehabilitation-Admissions Coordinator   Met with pt bedside as follow up from PM&R consult. Please see Dr. Florentina Jenny note for details. The patient is very interested in the CIR program. Confirmed DC support with pt's husband who is also in favor of this program for his wife. AC will begin insurance auth request for possible admit.  Will update once there has been a determination.   Raechel Ache, OTR/L  Rehab Admissions Coordinator  [phone] 04/25/2020 3:25 PM

## 2020-04-25 NOTE — Evaluation (Signed)
Speech Language Pathology Evaluation Patient Details Name: Madison Hurst MRN: [id number] DOB: 12/13/1960 Today's Date: 04/25/2020 Time: 0093-8182 SLP Time Calculation (min) (ACUTE ONLY): 23 min  Problem List:  Patient Active Problem List   Diagnosis Date Noted  . CVA (cerebral vascular accident) (HCC) 04/22/2020  . Leukocytosis 04/22/2020  . Essential hypertension 04/22/2020  . DOE (dyspnea on exertion) 03/13/2020  . Metabolic syndrome 03/13/2020  . Depressed left ventricular ejection fraction 03/13/2020  . PAC (premature atrial contraction) 03/13/2020  . PAT (paroxysmal atrial tachycardia) (HCC) 03/13/2020  . Anemia 02/09/2020  . Hypothyroidism 02/09/2020  . Pericardial effusion 02/09/2020  . 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 Vitamin D deficiency 02/09/2020  . Iron deficiency anemia, unspecified   . Iron deficiency anemia 01/24/2020  . Anxiety 10/28/2019  . Depressive disorder 10/28/2019  . Type 2 diabetes mellitus (HCC) 05/08/2019  . 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 Multinodular goiter 03/07/2015   Past Medical History:  Past Medical History:  Diagnosis Date  . Anemia 02/09/2020  . Anxiety 10/28/2019  . Depressed left ventricular ejection fraction 03/13/2020  . Depressive disorder 10/28/2019  . DOE (dyspnea on exertion) 03/13/2020  . Hypothyroidism 02/09/2020  . Iron deficiency anemia 01/24/2020  . Iron deficiency anemia, unspecified   . Metabolic syndrome 03/13/2020  . Multinodular goiter 03/07/2015  . PAC (premature atrial contraction) 03/13/2020  . PAT (paroxysmal atrial tachycardia) (HCC) 03/13/2020  . Pericardial effusion 02/09/2020  . 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 Type 2 diabetes mellitus (HCC) 05/08/2019  . Vitamin D deficiency 02/09/2020   Past Surgical History:  Past  Surgical History:  Procedure Laterality Date  . BUBBLE STUDY  04/24/2020   Procedure: BUBBLE STUDY;  Surgeon: Meriam Sprague, MD;  Location: [facility] ENDOSCOPY;  Service: Cardiovascular;;  . CESAREAN SECTION    . CHOLECYSTECTOMY    . other     Growth removal on chest  . TEE WITHOUT CARDIOVERSION N/A 04/24/2020   Procedure: TRANSESOPHAGEAL ECHOCARDIOGRAM (TEE);  Surgeon: Meriam Sprague, MD;  Location: [facility] ENDOSCOPY;  Service: Cardiovascular;  Laterality: N/A;  . TOTAL SHOULDER ARTHROPLASTY Left    HPI:  60 year old female with past medical history significant for paroxysmal atrial fibrillation, CAD, essential hypertension, hyperlipidemia, type 2 diabetes mellitus, rheumatoid arthritis, anxiety, depression who presented to the ED with progressive weakness on the left side with rapid progression over the last week resulting in 3 falls. 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 Noted in cardiology note: LVEF is severely reduced 20-25% with global hypokinesis   Assessment / Plan / Recommendation Clinical Impression  Pt presents with novel deficits in cognition post CVA. Deficits in cognition include reduced recall of novel information, reduced mental manipulation, and decreased executive function skills. PLOF pt was independent with all ADLs, living with spouse and son. She is a retired Runner, broadcasting/film/video. Pt reports spouse is disabled after previous stroke and that her son works long hours. Receptive and expressive language appeared intact. Pt reports prior to CVA acute onset of pain inside right oral cavity. She saw PCP for this and was talking anibiotics. Pain has persisted.  She notes at pain site, sensation of something there and states it feels harder to swallow and has changed her speech. Pt did appear to have subtle changes in resonance, however intelligiblity of speech remained intact. Oral motor exam was unremarkable. Recommend outpatient ENT follow  up with direct laryngoscopy to further assess pain in oropharynx and speech changes. SLP to follow up during acute stay to assist cognitive recovery.    SLP Assessment  SLP Recommendation/Assessment: Patient needs continued Speech Lanaguage Pathology Services SLP Visit Diagnosis: Cognitive communication deficit (R41.841)    Follow Up Recommendations  Inpatient Rehab    Frequency and Duration min 1 x/week  1 week      SLP Evaluation Cognition  Overall Cognitive Status: Impaired/Different from baseline Arousal/Alertness: Awake/alert Orientation Level: Oriented to person;Oriented to situation;Disoriented to time Attention: Focused;Alternating Focused Attention: Impaired Focused Attention Impairment: Verbal complex;Functional complex Alternating Attention: Impaired Alternating Attention Impairment: Verbal complex;Functional complex Memory: Impaired Memory Impairment: Decreased recall of new information;Decreased short term memory Decreased Short Term Memory: Verbal complex;Functional complex Problem Solving: Impaired Problem Solving Impairment: Verbal complex;Functional complex Executive Function: Self Monitoring;Organizing Organizing: Impaired Organizing Impairment: Verbal complex;Functional complex Self Monitoring: Impaired Self Monitoring Impairment: Verbal complex;Functional complex Safety/Judgment: Impaired       Comprehension  Auditory Comprehension Overall Auditory Comprehension: Appears within functional limits for tasks assessed    Expression Expression Primary Mode of Expression: Verbal Verbal Expression Overall Verbal Expression: Appears within functional limits for tasks assessed Written Expression Dominant Hand: Right   Oral / Motor  Oral Motor/Sensory Function Overall Oral Motor/Sensory Function: Within functional limits Motor Speech Overall Motor Speech: Other (comment) (reports changes in speech since onset of right pain in oral cavity) Respiration:  Impaired Level of Impairment: Sentence Phonation: Low vocal intensity Resonance: Hypernasality (impaaire) Articulation: Within functional limitis Intelligibility: Intelligible Motor Planning: Witnin functional limits Effective Techniques: Slow rate;Over-articulate   GO                    Ardyth Gal MA, CCC-SLP Acute Rehabilitation Services   04/25/2020, 11:05 AM

## 2020-04-25 NOTE — Plan of Care (Signed)

## 2020-04-25 NOTE — Progress Notes (Addendum)
[facility] STROKE TEAM PROGRESS NOTE   INTERVAL HISTORY No acute events since arrival.  Much improved mental status today. More alert and interactive with bright affect.  She reports sleeping ok last night and being tired of lying in bed. Asks to get on side of breakfast. Nursing staff and I assist her to side of bed. She tolerated the activity well. She states she understands she had a stroke and that her heart function is down. We discussed her stroke diagnosis, low EF, PFO and need for anticoagulation. She expressed understanding and questions were answered.   Denies headache or neck pain. Reports chronic low back and shoulder pain as unchanged.   Vitals:   04/24/20 1954 04/24/20 2317 04/25/20 0316 04/25/20 0356  BP: 103/82 106/78 94/72 109/79  Pulse: 80 82 83 89  Resp: 16 20 20    Temp: 98.2 F (36.8 C) 98 F (36.7 C) 98 F (36.7 C)   TempSrc: Oral Oral Oral   SpO2: 95% 98% 100%   Weight:      Height:       CBC:  Recent Labs  Lab 04/22/20 1342 04/23/20 0337 04/24/20 0114  WBC 13.2* 13.0* 12.5*  NEUTROABS 10.6*  --   --   HGB 14.9 14.5 13.5  HCT 49.0* 47.7* 42.5  MCV 100.8* 102.4* 102.2*  PLT 342 319 540   Basic Metabolic Panel:  Recent Labs  Lab 04/24/20 0114 04/25/20 0357  NA 138 137  K 3.8 6.0*  CL 101 104  CO2 24 22  GLUCOSE 131* 110*  BUN 13 13  CREATININE 0.55 0.72  CALCIUM 8.3* 8.3*  MG 1.6*  --    Lipid Panel:  Recent Labs  Lab 04/22/20 1838  CHOL 159  TRIG 111  HDL 49  CHOLHDL 3.2  VLDL 22  LDLCALC 88   HgbA1c:  Recent Labs  Lab 04/22/20 1838  HGBA1C 6.1*   Urine Drug Screen:  Recent Labs  Lab 04/23/20 0139  LABOPIA POSITIVE*  COCAINSCRNUR NONE DETECTED  LABBENZ NONE DETECTED  AMPHETMU NONE DETECTED  THCU NONE DETECTED  LABBARB NONE DETECTED    Alcohol Level  Recent Labs  Lab 04/22/20 1342  ETH <10    IMAGING past 24 hours ECHO TEE  Result Date: 04/24/2020    TRANSESOPHOGEAL ECHO REPORT   Patient Name:   Madison Hurst Date of Exam: 04/24/2020 Medical Rec #:  [id number]                     Height:       60.0 in Accession #:    [id number]                    Weight:       128.7 lb Date of Birth:  03-13-1961                    BSA:          1.548 m Patient Age:    60 years                      BP:           135/98 mmHg Patient Gender: F                             HR:           90 bpm. Exam Location:  Inpatient Procedure: Transesophageal Echo, 3D Echo, Color Doppler and Cardiac Doppler Indications:     Stroke i163.9  History:         Patient has prior history of Echocardiogram examinations, most                  recent 04/23/2020. Risk Factors:Hypertension and Diabetes.  Sonographer:     Raquel Sarna Senior RDCS Referring Phys:  [id number] Greer Ee [place] Diagnosing Phys: Gwyndolyn Kaufman MD PROCEDURE: After discussion of the risks and benefits of a TEE, an informed consent was obtained from the patient. The transesophogeal probe was passed without difficulty through the esophogus of the patient. Local oropharyngeal anesthetic was provided with Cetacaine. Sedation performed by different physician. The patient was monitored while under deep sedation. Anesthestetic sedation was provided intravenously by Anesthesiology: 36m of Propofol. The patient developed no complications during the procedure. IMPRESSIONS  1. Left ventricular ejection fraction, by estimation, is 20 to 25%. The left ventricle has severely decreased function. The left ventricle demonstrates global hypokinesis.  2. Right ventricular systolic function is moderately reduced. The right ventricular size is normal.  3. Left atrial size was moderately dilated. No left atrial/left atrial appendage thrombus was detected. The LAA emptying velocity was 30 cm/s.  4. Right atrial size was mildly dilated.  5. The mitral valve is degenerative. Mild to moderate mitral valve regurgitation.  6. The aortic valve is tricuspid. There is mild calcification of the aortic valve.  There is mild thickening of the aortic valve. Aortic valve regurgitation is trivial.  7. 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  8. A small PFO was detected by color flow Doppler with predominant L-->R shunting, however, there is flow reversal (R-->L) with valsalva.  9. Agitated saline contrast bubble study was positive with shunting observed within 3-6 cardiac cycles with valsalva suggestive of interatrial shunt. FINDINGS  Left Ventricle: Left ventricular ejection fraction, by estimation, is 20 to 25%. The left ventricle has severely decreased function. The left ventricle demonstrates global hypokinesis. The left ventricular internal cavity size was normal in size. Right Ventricle: The right ventricular size is normal. No increase in right ventricular wall thickness. Right ventricular systolic function is moderately reduced. Left Atrium: Left atrial size was moderately dilated. No left atrial/left atrial appendage thrombus was detected. The LAA emptying velocity was 30 cm/s. Right Atrium: Right atrial size was mildly dilated. Pericardium: There is no evidence of pericardial effusion. Mitral Valve: The mitral valve is degenerative in appearance. There is mild thickening of the mitral valve leaflet(s). There is mild calcification of the mitral valve leaflet(s). Mild to moderate mitral valve regurgitation. Tricuspid Valve: The tricuspid valve is normal in structure. Tricuspid valve regurgitation is mild. Aortic Valve: The aortic valve is tricuspid. There is mild calcification of the aortic valve. There is mild thickening of the aortic valve. Aortic valve regurgitation is trivial. 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 Pulmonic Valve: The pulmonic valve was normal in structure. Pulmonic valve regurgitation is trivial. Aorta: The aortic root and ascending aorta are  structurally normal, with no evidence of dilitation. IAS/Shunts: Evidence of atrial level shunting detected by color flow Doppler. Agitated saline contrast was given intravenously to evaluate for intracardiac shunting. Agitated saline contrast bubble study was positive with shunting observed within 3-6 cardiac cycles suggestive of interatrial shunt. A small patent foramen ovale is detected. HGwyndolyn KaufmanMD Electronically signed by  Gwyndolyn Kaufman MD Signature Date/Time: 04/24/2020/9:38:38 AM    Final    US Abdomen Limited RUQ (LIVER/GB)  Result Date: 04/24/2020 CLINICAL DATA:  Elevated liver enzymes EXAM: ULTRASOUND ABDOMEN LIMITED RIGHT UPPER QUADRANT COMPARISON:  None. FINDINGS: Gallbladder: Surgically absent. Common bile duct: Diameter: 5 mm. No intrahepatic or extrahepatic biliary duct dilatation. Liver: No focal lesion identified. Within normal limits in parenchymal echogenicity. Portal vein is patent on color Doppler imaging with normal direction of blood flow towards the liver. Other: None. IMPRESSION: Gallbladder absent.  Study otherwise unremarkable. Electronically Signed   By: Lowella Grip III M.D.   On: 04/24/2020 15:05   PHYSICAL EXAM Constitutional: Well developed middle aged female sitting up in bed in NAD.  MSK: Arthritic changes of bilateral hands and toes are present Respiratory: No increased work of breathing  Skin:  Purplish toes which patient reports is baseline secondary to her arthritis  Neuro: Mental Status: Alert and oriented x4  Calm, cooperative and conversant.  Speech is clear and fluent   Cranial Nerves: II: Visual Fields are full. Pupils are equal, round, and reactive to light.   III,IV, VI: EOMI without ptosis or diploplia.  V: Facial sensation is symmetric to temperature VII: Facial movement is symmetric.  VIII: hearing is intact to voice X: Uvula elevates symmetrically XI: Shoulder shrug is symmetric. XII: tongue is midline without atrophy or  fasciculations.  Motor: Tone is normal. Bulk is normal.  Right upper extremity 4/5 throughout, left upper extremity 4-/5 throughout, right lower extremity 5/5 throughout, left lower extremity 4/5 hip flexion, otherwise 5/5 throughout Sensory: Sensation is symmetric to light touch and temperature in the arms and legs. Plantars: Toes are downgoing bilaterally.  Cerebellar: FNF and HKS intact bilaterally  Slight left sided facial weakness.  Patient with generalized weakness of her upper and lower extremities, but more so on the left.  No Hoffman's bilat UE. Decreased grip on the left with pronator drift.  Patient is able to hold her right arm and leg up against gravity, but unable to do so for more than a few seconds on the left side.   ASSESSMENT/PLAN Madison Hurst is a 60 y.o. female with medical history significant of paroxysmal atrial tachycardia, CAD (newly diagnosed) with plan for evaluation by catheterization this month, hypertension, hyperlipidemia, diabetes mellitus type 2, rheumatoid arthritis, anxiety, and depression presents with complaints of worsening weakness on the left side most notably in the last week.    Stroke - right MCA/PCA, PCA, thalamus, right PLIC and CR scattered small/punctate infarcts, concerning for cardio-embolic source in the setting of cardiomyopathy with low EF   CT head No acute abnormality.Small vessel disease. Atrophy.  CTA head & neck:  No emergent large vessel occlusion. Severe stenosis of the right PCA P3 segment over a length of 23m. Mild stenosis of the left PCA P3 segment.  MRI Brain: Acute infarction affecting the right mid-posterior caudate and right superior thalamus. Single separate punctate acute infarction in the cortex of the medial right parietooccipital junction. Few small punctate acute infarctions in the right occipital cortex.   LE venous Doppler: No evidence of DVT  2D Echo EF 20-25%, severely decreased function of left  ventricle with global hypokinesis.  TEE: LVEF is severely reduced 20-25% with global hypokinesis  No loop per EP but agree with 30 day cardiac event monitoring as outpt to rule out afib.   LDL 88  HgbA1c 6.1  VTE prophylaxis - lovenox   No AC prior to admission,  now on ASA 22m and plavix daily DAPT.  Given embolic strokes with low EF, will recommend anticoagulation with eliquis. Repeat TTE every 3 months, once EF >35%, can change AC to antiplatelet.   Therapy recommendations:  CIR  Disposition:  TBD  Ischemic DCM, ASCAD Atrial arrhythmia  Cardiology consulted and following  Last echocardiogram on 03/11/2020 demonstrated EF 40 to 45%.  TEE 1/26 revealed PFO and EF 20-25%.   TTE 1/25 indicated worsening with EF of 20-25% and severely decreased function of left ventricle with global hypokinesis.    recent coronary CT with markedly elevated calcium score and cardiac cath was planned for this month   Zio patch 3-7 days in 02/2020 showed AT with variable block and frequent PACs  EKG this admission - atrial tachycardia with irregular rhythm  No loop per EP but agree with 30 day cardiac event monitoring as outpt to rule out afib.   Not a candidate for cath given acute stroke per cardiology  Continue ASA 81.  Given embolic strokes with low EF Eliquis was initiated yesterday. Repeat TTE every 3 months, once EF >35%, can change AC to antiplatelet.   We will sign off. Please contact [place] any further needs that arise in the future.    PFO  TEE showed PFO present with L-->R shunting by color doppler.   LE venous doppler neg for DVT  ROPE most at 4  Given pt significant cardiac issue, L->R shunt, PFO likely not associated with current stroke  Hypertension  Home meds:  Micardis 238mdaily  Now on avapro and metoprolol  Stable on admission . Long-term BP goal normotensive  Hyperlipidemia  Home meds:  2059mrestor started two weeks ago.   HOLD due to acute  transaminitis.  LDL 88, goal < 70  Continue statin once LFT normalizes   Diabetes type II Controlled  Home meds:  Metformin (on hold)  HgbA1c 6.1, goal < 7.0  CBGs  SSI sensitive   Close PCP follow up   Acute transaminitis  Alkaline phosphatase 162,   AST/ALT  65/62->203/121->569/313  Acute hepatitis panel negative   Right upper quadrant US [place]  Statin on hold  Management per primary team   Other Stroke Risk Factors  Former cigarette smoker:  reports that she quit smoking about 26 years ago. She has never used smokeless tobacco.   Other Active Problems managed by primary team:  RA with chronic pain managed by opioids : on Orencia injections weekly and prednisone 5mg2mily along with Vicodin for pain control. NSAIDS on hold due to bleeding risk. ESR WNL.  Concern for possible cervical myelopathy: Generalized weakness/hyperreflexia and falls prompted evaluation by cervical MRI which is of poor quality.   Leukocytosis/SIRS: WBCs 13->12.5 .  Afebrile.   Hypothyroidism: TSH 0.74 on admission, home levothyroxine 76 mcg daily continued.   Hospital day # 3  This plan of care was discussed with and directed by Dr. Jevonte Clanton. Erlinda HongeliHetty Blend-C  ATTENDING NOTE: I reviewed above note and agree with the assessment and plan. Pt was seen and examined.   Pt lying in bed, no family at bedside. Pt no acute event overnight, patient has no complaints. Still has left UE weakness and left hand dexterity difficulty, however seems improved from yesterday.  Cardiology on board for cardiomyopathy measurement.  We will set up a 30-day CardioNet monitoring as outpatient.  She is on Eliquis and aspirin, tolerating well.  However, her liver function AST/ALT continue trending up, etiology unclear,  GI on board.  Given patient low EF and current stroke, recommend continue Eliquis for stroke prevention.  Repeat TTE in 3 months, if EF more than 35%, anticoagulation can be switched to  antiplatelet.  However if A. fib found on cardio monitoring, anticoagulation should be continued.  Continue aspirin for CAD.  Patient will follow up with cardiology as outpatient for ischemic work-up.  Hold off statin until liver function improves/resolves.  Neurology will sign off. Please call with questions. Pt will follow up with stroke clinic NP at [facility] in about 4 weeks. Thanks for the consult.   Rosalin Hawking, MD PhD Stroke Neurology 04/25/2020 8:01 AM   To contact Stroke Continuity provider, please refer to [email or web address] After hours, contact General Neurology

## 2020-04-25 NOTE — PMR Pre-admission (Incomplete)
PMR Admission Coordinator Pre-Admission Assessment  Patient: Madison Hurst is an 60 y.o., female MRN: [id number] DOB: 1960/04/05 Height: 5' (152.4 cm) Weight: 58.4 kg              Insurance Information HMO:     PPO: yes     PCP:      IPA:      80/20:      OTHER:  PRIMARY: [employer]       Policy#: [id number]      Subscriber: patient CM Name: Lattie Haw      Phone#: [phone]     Fax#:  Pre-Cert#: [id number]      Employer:  Josem Kaufmann provided by Lattie Haw for admit to CIR on 04/26/20. Updates are due to [employer] weekly (p): [phone] [id number] (f): [phone] Benefits:  Phone #: online     Name: [email or web address] Eff. Date: 03/30/2020-03/29/2021     Deduct: does not have for in-network providers      Out of Pocket Max: $4,000 ($0 met)      Life Max:   CIR: $160/day co-pay for days 1-10, $0/day co-pay for days 11-90      SNF: 100% coverage, 0% co-insurance; limited by medical necessity Outpatient: $20/visit co-pay pending service; visits limited by medical necessity      Home Health: 100% coverage, 0% co-insurance; limited by medical necessity     DME: 80% coverage, 20% co-insurance      Providers:  SECONDARY: None      Policy#:       Phone#:   Development worker, community:       Phone#:   [employer] Engineer, [employer]" for patients in Inpatient Rehabilitation Facilities with attached "Privacy Act [place] Records" was provided and verbally reviewed with: Patient and Family  Emergency Contact Information Contact Information     Name Relation Home Work Mobile   Brents,Scottie Spouse [phone]       Current Medical History  Patient Admitting Diagnosis: R PCA/MCA stroke-with L hemiparesis   History of Present Illness: Madison Hurst is a 60 y.o. right-handed female with history of paroxysmal atrial tachycardia, CAD, hypertension, diabetes mellitus, hyperlipidemia, rheumatoid arthritis maintained on chronic prednisone,  anxiety and depression.  Per chart review patient lives with spouse.  1 level home 2 steps to entry.  Patient reports ambulating independently but with poor balance.  Presented 04/22/2020 with progressive left-sided weakness over 1 week resulting in 3 falls.  Admission chemistries glucose 173, AST 65, ALT 62, alkaline phosphatase 162, alcohol negative, hemoglobin A1c 6.1.  Cranial CT scan negative.  Patient did not receive TPA.  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  Single separate punctate acute infarct in the cortex of the medial right parieto-occipital junction.  Few small punctate acute infarcts in the right occipital cortex.  No evidence of hemorrhage or mass-effect.  CT angiogram of head and neck mild stenosis of the left PCA P3 segment.  No emergent large vessel occlusion.  MRI cervical spine showed apparent moderate severe spinal stenosis C4-5 and 5-6 and moderate at C3-4.  Multilevel neuroforaminal narrowing greatest at C3-4 and on the right C4-C5.Marland Kitchen  Echocardiogram with ejection fraction 20 to 25%.  The left ventricle demonstrated global hypokinesis.  TEE showed mild to moderate MR.  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  No LAA thrombus.  Currently maintained on aspirin 81 mg daily as well as Eliquis.  LFTs have continued to elevate.  Acute hepatitis panel negative.  Ultrasound the abdomen showed no focal lesion identified.  Gallbladder absent.  Study otherwise unremarkable.  Therapy evaluations completed physical medicine rehab consult requested due to patient's left side weakness. Pt is to admit to CIR on 04/26/20.    Complete NIHSS TOTAL: 2 Glasgow Coma Scale Score: (!) 19  Past Medical History  Past Medical History:  Diagnosis Date   Anemia 02/09/2020   Anxiety 10/28/2019   Depressed left ventricular ejection fraction 03/13/2020   Depressive disorder 10/28/2019   DOE (dyspnea on exertion) 03/13/2020   Hypothyroidism  02/09/2020   Iron deficiency anemia 09/62/8366   Metabolic syndrome 29/47/6546   Multinodular goiter 03/07/2015   PAC (premature atrial contraction) 03/13/2020   PAT (paroxysmal atrial tachycardia) ([place]) 03/13/2020   Pericardial effusion 02/09/2020   Secondary generalized osteoporosis 02/09/2020   Seropositive rheumatoid arthritis ([place]) 02/09/2020   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  Prior Rehab/Hospitalizations:  Has the patient had prior rehab or hospitalizations prior to admission? No  Has the patient had major surgery during 100 days prior to admission? Yes  Current Medications   Current Facility-Administered Medications:    apixaban (ELIQUIS) tablet 5 mg, 5 mg, Oral, BID, Rosalin Hawking, MD, 5 mg at 04/26/20 5035   aspirin EC tablet 81 mg, 81 mg, Oral, Daily, Rosalin Hawking, MD, 81 mg at 04/26/20 0955   buPROPion (WELLBUTRIN XL) 24 hr tablet 150 mg, 150 mg, Oral, Daily, Smith, Rondell A, MD, 150 mg at 04/26/20 0954   DULoxetine (CYMBALTA) DR capsule 60 mg, 60 mg, Oral, Daily, Tamala Julian, Rondell A, MD, 60 mg at 46/56/81 2751   folic acid (FOLVITE) tablet 2 mg, 2 mg, Oral, Daily, Tamala Julian, Rondell A, MD, 2 mg at 04/26/20 0954   HYDROcodone-acetaminophen (NORCO/VICODIN) 5-325 MG per tablet 1 tablet, 1 tablet, Oral, Q6H PRN, Fuller Plan A, MD, 1 tablet at 04/26/20 1458   insulin aspart (novoLOG) injection 0-5 Units, 0-5 Units, Subcutaneous, QHS, Smith, Rondell A, MD   insulin aspart (novoLOG) injection 0-9 Units, 0-9 Units, Subcutaneous, TID WC, Smith, Rondell A, MD, 1 Units at 04/24/20 1839   levothyroxine (SYNTHROID) tablet 75 mcg, 75 mcg, Oral, QAC breakfast, Fuller Plan A, MD, 75 mcg at 04/26/20 0517   LORazepam (ATIVAN) tablet 0.5 mg, 0.5 mg, Oral, Q8H PRN, [place] Donnamarie Poag,  DO, 0.5 mg at 04/23/20 2037   metoprolol succinate (TOPROL-XL) 24 hr tablet 12.5 mg, 12.5 mg, Oral, Daily, Sande Rives E, PA-C, 12.5 mg at 04/26/20 1457   predniSONE (DELTASONE) tablet 5 mg, 5 mg, Oral, Daily, Tamala Julian, Rondell A, MD, 5 mg at 04/26/20 7001  Patients Current Diet:  Diet Order             Diet - low sodium heart healthy           Diet heart healthy/carb modified Room service appropriate? Yes; Fluid consistency: Thin  Diet effective now                   Precautions / Restrictions Precautions Precautions: Fall Precaution Comments: EF 20-25% Restrictions Weight Bearing Restrictions: No   Has the patient had 2 or more falls or a fall with injury in the past year?Yes  Prior Activity Level Community (5-7x/wk): Independent in ambulation with some assist for dressing (buttons/zippers/clips  due to RA). no AD use PTA. does not work or drive  Prior Functional Level Prior Function Level of Independence: Needs assistance Gait / Transfers Assistance Needed: pt reports being indep for ADLs' and mobility; but noted that reporting to PT services that she require A for picking items up off the floor ADL's / Homemaking Assistance Needed: intermittent min A  Self Care: Did the patient need help bathing, dressing, using the toilet or eating?  Needed some help  Indoor Mobility: Did the patient need assistance with walking from room to room (with or without device)? Independent  Stairs: Did the patient need assistance with internal or external stairs (with or without device)? Needed some help  Functional Cognition: Did the patient need help planning regular tasks such as shopping or remembering to take medications? Independent  Home Assistive Devices / Equipment Home Equipment: Cane - single point,Wheelchair - manual,Shower seat,Bedside commode  Prior Device Use: Indicate devices/aids used by the patient prior to current illness, exacerbation or injury? None of the  above  Current Functional Level Cognition  Arousal/Alertness: Awake/alert Overall Cognitive Status: Impaired/Different from baseline Current Attention Level: Sustained Orientation Level: Oriented to person,Oriented to place,Oriented to time Following Commands: Follows one step commands consistently Safety/Judgement: Decreased awareness of safety Attention: Focused,Alternating Focused Attention: Impaired Focused Attention Impairment: Verbal complex,Functional complex Alternating Attention: Impaired Alternating Attention Impairment: Verbal complex,Functional complex Memory: Impaired Memory Impairment: Decreased recall of new information,Decreased short term memory Decreased Short Term Memory: Verbal complex,Functional complex Problem Solving: Impaired Problem Solving Impairment: Verbal complex,Functional complex Executive Function: Self Monitoring,Organizing Organizing: Impaired Organizing Impairment: Verbal complex,Functional complex Self Monitoring: Impaired Self Monitoring Impairment: Verbal complex,Functional complex Safety/Judgment: Impaired    Extremity Assessment (includes Sensation/Coordination)  Upper Extremity Assessment: RUE deficits/detail,LUE deficits/detail RUE Deficits / Details: pt with only mild strength deficits present to R UE husband confirming/reporting that basel;ine with arthritis limiting strength to R side baseline but appears [facility] for participation and completion of basic ALD's. LUE Deficits / Details: mild deficits fluctuating from +3 to -4/5 limited by arousal and alertness with participation.  Lower Extremity Assessment: Defer to PT evaluation RLE Deficits / Details: grossly 4/5 LLE Deficits / Details: 3-/5 knee extension, 3-/5 DF, 2/5 PF    ADLs  Overall ADL's : Needs assistance/impaired Grooming: Minimal assistance,Sitting Upper Body Dressing : Moderate assistance,Sitting Lower Body Dressing: Maximal assistance,Cueing for safety Toilet Transfer:  Moderate assistance,Stand-pivot,Cueing for safety General ADL Comments: difficulty with assessing what appears as an accurate level of performance for completion of ADL's d/t inability to sustain level of arousal. requiring anywhere from min-max A for safe completion of seated and standing ADL's. will continue to assess level of performance with further participation with therapy session. anticiapte with improved arousal pt will demo increased performance.    Mobility  Overal bed mobility: Needs Assistance Bed Mobility: Supine to Sit Supine to sit: Mod assist,HOB elevated Sit to supine: Max assist,HOB elevated General bed mobility comments: use of bed rail    Transfers  Overall transfer level: Needs assistance Equipment used: 1 person hand held assist,Rolling walker (2 wheeled) Transfers: Sit to/from [employer] Sit to Stand: Min assist Stand pivot transfers: Mod assist General transfer comment: initial sit to stand with minA and PT hand hold, SPT with PT hand hold. 3 transfers from recliner with RW and minA    Ambulation / Gait / Stairs / Wheelchair Mobility  Ambulation/Gait Ambulation/Gait assistance: Mod assist Gait Distance (Feet): 10 Feet Assistive device: Rolling walker (2 wheeled) Gait Pattern/deviations: Step-to  pattern,Decreased step length - left,Decreased dorsiflexion - left,Decreased weight shift to right,Decreased weight shift to left General Gait Details: pt with short step-to gait, requires verbal cues and tactile cueing to facilitate weight shift and to aide in L foot clearance Gait velocity: reduced Gait velocity interpretation: <1.31 ft/sec, indicative of household ambulator    Posture / Balance Dynamic Sitting Balance Sitting balance - Comments: mod-maxA for dynamic sitting balance without UE support Balance Overall balance assessment: Needs assistance Sitting-balance support: Single extremity supported,Feet supported Sitting balance-Leahy Scale:  Poor Sitting balance - Comments: mod-maxA for dynamic sitting balance without UE support Postural control: Posterior lean Standing balance support: Single extremity supported,Bilateral upper extremity supported Standing balance-Leahy Scale: Poor Standing balance comment: minA with BUE support and use of RW    Special needs/care consideration Skin: ecchymosis to back, hip, thigh (bilateral) and Diabetic management: yes     Previous Home Environment (from acute therapy documentation) Living Arrangements: Spouse/significant other  Lives With: Spouse,Son Available Help at Discharge: Family,Available PRN/intermittently Type of Home: House Home Layout: One level Home Access: Stairs to enter Entrance Stairs-Rails: Can reach both Entrance Stairs-Number of Steps: 2 Bathroom Shower/Tub: Chiropodist: Standard Additional Comments: husband supportive at bedside  Discharge Living Setting Plans for Discharge Living Setting: Lives with (comment),House (husband and son (>80 yo)) Type of Home at Discharge: House Discharge Home Layout: One level Discharge Home Access: Stairs to enter Entrance Stairs-Rails: None Entrance Stairs-Number of Steps: 1 Discharge Bathroom Shower/Tub: Walk-in shower Discharge Bathroom Toilet: Standard Discharge Bathroom Accessibility: Yes How Accessible: Accessible via walker Does the patient have any problems obtaining your medications?: No  Social/Family/Support Systems Patient Roles: Spouse Contact Information: husband: Victorino Sparrow [phone] Anticipated Caregiver: husband + son (son is over 38 yo) Anticipated Caregiver's Contact Information: see above Ability/Limitations of Caregiver: Supervision/Min A  Caregiver Availability: Intermittent (close to 24/7 per his report) Discharge Plan Discussed with Primary Caregiver: Yes Is Caregiver In Agreement with Plan?: Yes Does Caregiver/Family have Issues with Lodging/Transportation while Pt is in  Rehab?: No   Goals Patient/Family Goal for Rehab: PT/OT: supervision/Min A; SLP: Mod I Expected length of stay: 15-19 days Pt/Family Agrees to Admission and willing to participate: Yes Program Orientation Provided & Reviewed with Pt/Caregiver Including Roles  & Responsibilities: Yes  Barriers to Discharge: Home environment access/layout,Lack of/limited family support  Barriers to Discharge Comments: one step to enter; husband can be at home close to 24/7.   Decrease burden of Care through IP rehab admission: Other NA   Possible need for SNF placement upon discharge:Not anticipated; anticipate pt can reach Supervision/Min A level which is a level the patient's husband can perform as needed.    Patient Condition: This patient's condition remains as documented in the consult dated 04/25/20, in which the Rehabilitation Physician determined and documented that the patient's condition is appropriate for intensive rehabilitative care in an inpatient rehabilitation facility. Will admit to inpatient rehab today.   Preadmission Screen Completed By:  Raechel Ache, OT, 04/26/2020 3:03 PM ______________________________________________________________________   Discussed status with Dr. Posey Pronto on 04/26/20 at 3:03PM and received approval for admission today.  Admission Coordinator:  Raechel Ache, time 3:03PM/Date 04/26/20

## 2020-04-25 NOTE — Progress Notes (Addendum)
Progress Note  Patient Name: Madison Hurst Date of Encounter: 04/25/2020  [facility] Cardiologist: Thomasene Ripple, DO   Subjective   More alert this am.  TEE yesterday showed severe LV dysfunction with EF 25-30% with PFO and bidirectional shunting.  She denies any recent hx of chest pain.   Inpatient Medications    Scheduled Meds: . apixaban  5 mg Oral BID  . aspirin EC  81 mg Oral Daily  . buPROPion  150 mg Oral Daily  . DULoxetine  60 mg Oral Daily  . folic acid  2 mg Oral Daily  . insulin aspart  0-5 Units Subcutaneous QHS  . insulin aspart  0-9 Units Subcutaneous TID WC  . irbesartan  75 mg Oral Daily  . levothyroxine  75 mcg Oral QAC breakfast  . metoprolol succinate  12.5 mg Oral Daily  . predniSONE  5 mg Oral Daily   Continuous Infusions:  PRN Meds: HYDROcodone-acetaminophen, LORazepam   Vital Signs    Vitals:   04/24/20 2317 04/25/20 0316 04/25/20 0356 04/25/20 0800  BP: 106/78 94/72 109/79 (!) 86/65  Pulse: 82 83 89   Resp: 20 20  20   Temp: 98 F (36.7 C) 98 F (36.7 C)  97.6 F (36.4 C)  TempSrc: Oral Oral  Axillary  SpO2: 98% 100%  100%  Weight:      Height:       No intake or output data in the 24 hours ending 04/25/20 1006 Last 3 Weights 04/23/2020 04/22/2020 03/13/2020  Weight (lbs) 128 lb 12 oz 123 lb 123 lb 3.2 oz  Weight (kg) 58.4 kg 55.792 kg 55.883 kg      Telemetry    NSR with PACs - Personally Reviewed  ECG    No new EKG to review - Personally Reviewed  Physical Exam   GEN: No acute distress.   Neck: No JVD Cardiac: RRR, no murmurs, rubs, or gallops.  Respiratory: Clear to auscultation bilaterally. GI: Soft, nontender, non-distended  MS: No edema; No deformity. Neuro:  Nonfocal  Psych: Normal affect   Labs    High Sensitivity Troponin:  No results for input(s): TROPONINIHS in the last 720 hours.    Chemistry Recent Labs  Lab 04/23/20 0337 04/24/20 0114 04/25/20 0357  NA 136 138 137  K 3.5 3.8  6.0*  CL 99 101 104  CO2 24 24 22   GLUCOSE 127* 131* 110*  BUN 13 13 13   CREATININE 0.64 0.55 0.72  CALCIUM 8.0* 8.3* 8.3*  PROT 5.3* 5.1* 4.9*  ALBUMIN 2.7* 2.6* 2.5*  AST 203* 569* 989*  ALT 121* 313* 606*  ALKPHOS 218* 312* 386*  BILITOT 1.1 1.1 1.4*  GFRNONAA >60 >60 >60  ANIONGAP 13 13 11      Hematology Recent Labs  Lab 04/22/20 1342 04/23/20 0337 04/24/20 0114  WBC 13.2* 13.0* 12.5*  RBC 4.86 4.66 4.16  HGB 14.9 14.5 13.5  HCT 49.0* 47.7* 42.5  MCV 100.8* 102.4* 102.2*  MCH 30.7 31.1 32.5  MCHC 30.4 30.4 31.8  RDW 16.9* 17.2* 17.2*  PLT 342 319 292    BNPNo results for input(s): BNP, PROBNP in the last 168 hours.   DDimer No results for input(s): DDIMER in the last 168 hours.      Radiology    MR CERVICAL SPINE WO CONTRAST  Result Date: 04/23/2020 CLINICAL DATA:  Myelopathy, acute or progressive. Additional history provided: Patient reports worsening weakness on the left side, most notably within the last week,  3 falls in the last 2 days weakness all over, but the left-sided seems weaker than the right. EXAM: MRI CERVICAL SPINE WITHOUT CONTRAST TECHNIQUE: Multiplanar, multisequence MR imaging of the cervical spine was performed. No intravenous contrast was administered. COMPARISON:  CT head/neck 04/22/2020.  CT cervical spine 01/26/2015. FINDINGS: All sequences are moderate to severely motion degraded, significantly limiting evaluation. Alignment: Straightening of the expected cervical lordosis. No significant spondylolisthesis. Vertebrae: No significant marrow edema or focal suspicious osseous Lea is identified. Cord: Motion degradation precludes adequate evaluation for spinal cord signal abnormality. Posterior Fossa, vertebral arteries, paraspinal tissues: Cerebellar atrophy. No acute abnormality identified within included portions of the posterior fossa. Cervical vertebral artery flow voids poorly assessed due to motion degradation. No paraspinal soft tissue  abnormality is identified. Disc levels: Multilevel disc degeneration. Most notably, there is moderate disc degeneration at the C4-C5, C5-C6 and C6-C7 levels. C2-C3: Disc bulge. Facet hypertrophy. No significant spinal canal stenosis. Mild bilateral neural foraminal narrowing. C3-C4: Disc bulge. Bilateral disc osteophyte ridge/uncinate hypertrophy (greater on the right). Facet and ligamentum flavum hypertrophy. Apparent moderate spinal canal stenosis. Bilateral neural foraminal narrowing (severe right, mild left). C4-C5: Posterior disc osteophyte complex. Uncovertebral, facet and ligamentum flavum hypertrophy. Apparent moderate/severe spinal canal stenosis. Bilateral neural foraminal narrowing (moderate/severe right, moderate left). C5-C6: Posterior disc osteophyte complex. Uncovertebral, facet and ligamentum flavum hypertrophy. Apparent moderate/severe spinal canal stenosis. Bilateral neural foraminal narrowing (moderate right, moderate/severe left). C6-C7: Posterior disc osteophyte complex. Uncovertebral, facet and ligamentum flavum hypertrophy. Apparent moderate spinal canal stenosis. Bilateral neural foraminal narrowing (moderate/severe right, moderate left). C7-T1: Mild endplate spurring on the left. Mild facet hypertrophy (greater on the left). No appreciable disc herniation or spinal canal stenosis. Mild left neural foraminal narrowing. IMPRESSION: All acquired sequences are moderate to severely motion degraded, significantly limiting evaluation. A repeat examination should be considered when the patient is better able to tolerate the study. Cervical spondylosis as outlined. Most notably, there is apparent moderate/severe spinal canal stenosis at C4-C5 and C5-C6, and apparent moderate spinal canal stenosis at C3-C4 and C6-C7. Motion degradation limits evaluation for subtle spinal cord mass effect and precludes adequate evaluation for spinal cord signal abnormality. Multilevel neural foraminal narrowing as  detailed and greatest on the right at C3-C4 (severe), on the right at C4-C5 (moderate/severe), on the left at C5-C6 (moderate/severe) and on the right at C6-C7 (moderate/severe). Electronically Signed   By: Jackey Loge DO   On: 04/23/2020 12:27   ECHOCARDIOGRAM COMPLETE  Result Date: 04/23/2020    ECHOCARDIOGRAM REPORT   Patient Name:   Charlott CAROL Gonzella Lex Date of Exam: 04/23/2020 Medical Rec #:  [id number]                     Height:       60.0 in Accession #:    [id number]                    Weight:       123.0 lb Date of Birth:  June 20, 1960                    BSA:          1.518 m Patient Age:    59 years                      BP:           115/102 mmHg Patient Gender: F  HR:           107 bpm. Exam Location:  Inpatient Procedure: 2D Echo and Color Doppler Indications:    Stroke 434.91 / I163.9  History:        Patient has prior history of Echocardiogram examinations, most                 recent 03/11/2020. Signs/Symptoms:Dyspnea; Risk                 Factors:Diabetes. Hypothyroidism.  Sonographer:    Tiffany Dance Referring Phys: [id number] RONDELL A SMITH IMPRESSIONS  1. Left ventricular ejection fraction, by estimation, is 20 to 25%. The left ventricle has severely decreased function. The left ventricle demonstrates global hypokinesis. Left ventricular diastolic parameters are indeterminate.  2. Right ventricular systolic function is normal. The right ventricular size is normal.  3. Left atrial size was moderately dilated.  4. The mitral valve is normal in structure. Mild mitral valve regurgitation. No evidence of mitral stenosis.  5. The aortic valve is tricuspid. Aortic valve regurgitation is not visualized. No aortic stenosis is present.  6. The inferior vena cava is normal in size with greater than 50% respiratory variability, suggesting right atrial pressure of 3 mmHg. FINDINGS  Left Ventricle: Left ventricular ejection fraction, by estimation, is 20 to 25%. The left  ventricle has severely decreased function. The left ventricle demonstrates global hypokinesis. The left ventricular internal cavity size was normal in size. There is no left ventricular hypertrophy. Left ventricular diastolic parameters are indeterminate. Right Ventricle: The right ventricular size is normal. Right ventricular systolic function is normal. Left Atrium: Left atrial size was moderately dilated. Right Atrium: Right atrial size was normal in size. Pericardium: There is no evidence of pericardial effusion. Mitral Valve: The mitral valve is normal in structure. Mild mitral annular calcification. Mild mitral valve regurgitation. No evidence of mitral valve stenosis. Tricuspid Valve: The tricuspid valve is normal in structure. Tricuspid valve regurgitation is trivial. No evidence of tricuspid stenosis. Aortic Valve: The aortic valve is tricuspid. Aortic valve regurgitation is not visualized. No aortic stenosis is present. Pulmonic Valve: The pulmonic valve was grossly normal. Pulmonic valve regurgitation is not visualized. No evidence of pulmonic stenosis. Aorta: The aortic root is normal in size and structure. Venous: The inferior vena cava is normal in size with greater than 50% respiratory variability, suggesting right atrial pressure of 3 mmHg. IAS/Shunts: No atrial level shunt detected by color flow Doppler.  LEFT VENTRICLE PLAX 2D LVIDd:         4.70 cm LVIDs:         4.00 cm LV PW:         1.20 cm LV IVS:        0.70 cm LVOT diam:     1.90 cm LV SV:         19 LV SV Index:   13 LVOT Area:     2.84 cm  RIGHT VENTRICLE            IVC RV Basal diam:  2.20 cm    IVC diam: 2.10 cm RV S prime:     6.42 cm/s TAPSE (M-mode): 1.2 cm LEFT ATRIUM             Index       RIGHT ATRIUM           Index LA diam:        5.10 cm 3.36 cm/m  RA Area:  12.90 cm LA Vol (A2C):   68.6 ml 45.19 ml/m RA Volume:   27.30 ml  17.98 ml/m LA Vol (A4C):   64.6 ml 42.55 ml/m LA Biplane Vol: 67.3 ml 44.33 ml/m  AORTIC VALVE  LVOT Vmax:   48.75 cm/s LVOT Vmean:  30.800 cm/s LVOT VTI:    0.068 m  AORTA Ao Root diam: 2.70 cm Ao Asc diam:  3.40 cm MITRAL VALVE MV Area (PHT): 5.13 cm    SHUNTS MV Decel Time: 148 msec    Systemic VTI:  0.07 m MV E velocity: 92.40 cm/s  Systemic Diam: 1.90 cm MV A velocity: 71.10 cm/s MV E/A ratio:  1.30 Olga Millers MD Electronically signed by Olga Millers MD Signature Date/Time: 04/23/2020/2:25:22 PM    Final    ECHO TEE  Result Date: 04/24/2020    TRANSESOPHOGEAL ECHO REPORT   Patient Name:   [name] Date of Exam: 04/24/2020 Medical Rec #:  [id number]                     Height:       60.0 in Accession #:    [id number]                    Weight:       128.7 lb Date of Birth:  February 22, 1961                    BSA:          1.548 m Patient Age:    59 years                      BP:           135/98 mmHg Patient Gender: F                             HR:           90 bpm. Exam Location:  Inpatient Procedure: Transesophageal Echo, 3D Echo, Color Doppler and Cardiac Doppler Indications:     Stroke i163.9  History:         Patient has prior history of Echocardiogram examinations, most                  recent 04/23/2020. Risk Factors:Hypertension and Diabetes.  Sonographer:     Irving Burton Senior RDCS Referring Phys:  [id number] Kathlynn Grate PEMBERTON Diagnosing Phys: Laurance Flatten MD PROCEDURE: After discussion of the risks and benefits of a TEE, an informed consent was obtained from the patient. The transesophogeal probe was passed without difficulty through the esophogus of the patient. Local oropharyngeal anesthetic was provided with Cetacaine. Sedation performed by different physician. The patient was monitored while under deep sedation. Anesthestetic sedation was provided intravenously by Anesthesiology: 66mg  of Propofol. The patient developed no complications during the procedure. IMPRESSIONS  1. Left ventricular ejection fraction, by estimation, is 20 to 25%. The left ventricle has severely  decreased function. The left ventricle demonstrates global hypokinesis.  2. Right ventricular systolic function is moderately reduced. The right ventricular size is normal.  3. Left atrial size was moderately dilated. No left atrial/left atrial appendage thrombus was detected. The LAA emptying velocity was 30 cm/s.  4. Right atrial size was mildly dilated.  5. The mitral valve is degenerative. Mild to moderate mitral valve regurgitation.  6. The aortic valve is tricuspid. There is mild calcification  of the aortic valve. There is mild thickening of the aortic valve. Aortic valve regurgitation is trivial.  7. 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  8. A small PFO was detected by color flow Doppler with predominant L-->R shunting, however, there is flow reversal (R-->L) with valsalva.  9. Agitated saline contrast bubble study was positive with shunting observed within 3-6 cardiac cycles with valsalva suggestive of interatrial shunt. FINDINGS  Left Ventricle: Left ventricular ejection fraction, by estimation, is 20 to 25%. The left ventricle has severely decreased function. The left ventricle demonstrates global hypokinesis. The left ventricular internal cavity size was normal in size. Right Ventricle: The right ventricular size is normal. No increase in right ventricular wall thickness. Right ventricular systolic function is moderately reduced. Left Atrium: Left atrial size was moderately dilated. No left atrial/left atrial appendage thrombus was detected. The LAA emptying velocity was 30 cm/s. Right Atrium: Right atrial size was mildly dilated. Pericardium: There is no evidence of pericardial effusion. Mitral Valve: The mitral valve is degenerative in appearance. There is mild thickening of the mitral valve leaflet(s). There is mild calcification of the mitral valve leaflet(s). Mild to moderate mitral valve regurgitation. Tricuspid Valve: The  tricuspid valve is normal in structure. Tricuspid valve regurgitation is mild. Aortic Valve: The aortic valve is tricuspid. There is mild calcification of the aortic valve. There is mild thickening of the aortic valve. Aortic valve regurgitation is trivial. 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 Pulmonic Valve: The pulmonic valve was normal in structure. Pulmonic valve regurgitation is trivial. Aorta: The aortic root and ascending aorta are structurally normal, with no evidence of dilitation. IAS/Shunts: Evidence of atrial level shunting detected by color flow Doppler. Agitated saline contrast was given intravenously to evaluate for intracardiac shunting. Agitated saline contrast bubble study was positive with shunting observed within 3-6 cardiac cycles suggestive of interatrial shunt. A small patent foramen ovale is detected. Laurance Flatten MD Electronically signed by Laurance Flatten MD Signature Date/Time: 04/24/2020/9:38:38 AM    Final    US Abdomen Limited RUQ (LIVER/GB)  Result Date: 04/24/2020 CLINICAL DATA:  Elevated liver enzymes EXAM: ULTRASOUND ABDOMEN LIMITED RIGHT UPPER QUADRANT COMPARISON:  None. FINDINGS: Gallbladder: Surgically absent. Common bile duct: Diameter: 5 mm. No intrahepatic or extrahepatic biliary duct dilatation. Liver: No focal lesion identified. Within normal limits in parenchymal echogenicity. Portal vein is patent on color Doppler imaging with normal direction of blood flow towards the liver. Other: None. IMPRESSION: Gallbladder absent.  Study otherwise unremarkable. Electronically Signed   By: Bretta Bang III M.D.   On: 04/24/2020 15:05    Cardiac Studies   TEE 03/2020 IMPRESSIONS  1. Left ventricular ejection fraction, by estimation, is 20 to 25%. The  left ventricle has severely decreased function. The left ventricle  demonstrates global hypokinesis.  2. Right ventricular systolic  function is moderately reduced. The right  ventricular size is normal.  3. Left atrial size was moderately dilated. No left atrial/left atrial  appendage thrombus was detected. The LAA emptying velocity was 30 cm/s.  4. Right atrial size was mildly dilated.  5. The mitral valve is degenerative. Mild to moderate mitral valve  regurgitation.  6. The aortic valve is tricuspid. There is mild calcification of the  aortic valve. There is mild thickening of the aortic valve. Aortic valve  regurgitation is trivial.  7. There is small, fibrinous stranding noted on  the aortic valve leaflet  most consistent with a Lambl's excrescence.  8. A small PFO was detected by color flow Doppler with predominant L-->R  shunting, however, there is flow reversal (R-->L) with valsalva.  9. Agitated saline contrast bubble study was positive with shunting  observed within 3-6 cardiac cycles with valsalva suggestive of interatrial  shunt.   Patient Profile     60 y.o. female with a hx of PAT, CAD, HTN, HLD, DM2, RA who is being seen today for the evaluation of palpitations and atrial tachycardia as well as abnormal coronary CTA at the request of Madelyn Flavors, MD.  Assessment & Plan    1.  Acute CV - right MCA/PCA, PCA, thalamus, right PLIC and CR scattered small/punctate infarcts, concerning for cardio-embolic source in the setting of cardiomyopathy with low EF  -TEE showed severe LV dysfunction with EF 20-25%, no LA or LAA thrombus but positive for PFO with bidirectional shunting with Valsalva -LE venous dopplers neg for DVT -Neuro feels PFO likely not associated with current CVA -she has a hx of PAT but no PAF -no ILR at this time per EP -will get 30 day event monitor to assess for silent PAF -had been on ASA and Plavix but Neuro has recommended addition of Eliquis  BID so Plavix stopped and if EF improved in 3 months then stop Eliquis if no afib found and restart Plavix  2.  Paroxysmal atrial  tachycardia -started on low dose BB but having to hold due to soft BP -it appears as though she is having ST wit frequent PACs but no discernable afib on tele  3.  ASCAD -recent coronary CTA showed dRCA, moderate mLAD with 50-69% stenosis and mid Ramus with 50-69% stenosis as well as non dominant LCx with moderate lesions in OM1.  -FFRct shows multiple vessels with hemodynamically significant flow limiting stenosis. -cardiac cath was recommended but patient had not followed back up with her cardiologist -given acute CVA, not a candidate for cardiac cath at this time -will need to manage medically for now -continue ASA  -hold BB due to soft BP -statin on hold due to bump in LFTs  4.  HLD -LDL goal < 70 -LDL was 88 this admit -started on Crestor  daily a few weeks ago but now LFTs elevated -hold statin for now and restart once LFTs normalize  5.  HTN -BP low this am at 86/5mmhg -hold Irbesartan and Toprol  6.  Ischemic DCM -EF 40-45% by recent echo but now down to 25-30% by echo this admit -ultimately needs cardiac cath and possible revascularization but given acute CVA need to wait on cath -started on Entresto and Toprol but now hypotensive -stop ARB and BB for now  7.  Hyperkalemia -K+ 6 this am but specimen hemolyzed -repeat BMET stat -holding ARB for low BP  8.  Transaminitis -LFts continues to trend up -? Etiology -workup in progress    For questions or updates, please contact [facility] Please consult [email or web address] for contact info under        Signed, Armanda Magic, MD  04/25/2020, 10:06 AM

## 2020-04-25 NOTE — Consult Note (Addendum)
[facility] Consult: 12:32 PM 04/25/2020  LOS: 3 days    Referring Provider: Dr [place]  Primary Care Physician:  Serita Grammes, MD Primary Gastroenterologist:  Dr. Lyndel Safe     Reason for Consultation:  Rising LFTs.     HPI: Madison Hurst is a 60 y.o. female.  PMH rheumatoid arthritis on Orencia and low-dose prednisone.  Goiter, hypothyroidism.  DM 2.  TIA 02/2020.  Paroxysmal atrial tachycardia.  CAD.  Surgeries include C-section, cholecystectomy and shoulder arthroplasty.  01/2013 colonoscopy.  For eval diarrhea, anemia, FOBT negative.  Minor sigmoid diverticulosis.  Nonbleeding small internal hemorrhoids.  Otherwise normal study to TI.  Random biopsies obtained, no path found.  Over the last year patient has had cardiology evaluation for dizzy spells.  Cardiac monitor in November revealed heart rates to 272 BPM, multiple runs of SVT, treated with metoprolol.  02/2020 echo with LVEF 40 to 45%, grade 1 DD.  Admitted a few days ago.  Presented with 2 weeks of generalized weakness greater on the left.  Became dizzy and fell on the day of arrival.  Slurred speech. Ruled in for right posterior circulation embolic stroke (atheroembolic vs cardioembolic).  Repeat 04/24/2020 TEE: LVEF 20 to 25%.  Moderately reduced right ventricular systolic function.  Small PFO.  Bubble study suggests intra-atrial shunt.  Neurology feels her cardiomyopathy is the most likely cause of her stroke and are recommending Eliquis.  She is already receiving 81 ASA daily.  T bili 0.9 >> 1.4.  Alk phos 162 >> 386.  AST/ALT 65/62 >> 989/606. No prior LFTs for comparison. Hep A IgM, Hep B surface Ag, hep B core IgM, HCV: antibody all nonreactive. INR 3 days ago 1.3. Prealbumin 17.5, albumin 2.5. Normal renal function.   Potassium today 6.0, previously normal. Hb 13.5, MCV 102.  Platelets 292. 04/24/20 abdominal ultrasound: Surgically absent GB.  Normal  Liver.  CBD 5 mm.  No intrahepatic, extrahepatic or ductal dilatation.  Patent portal vein with normal directional flow.  Patient has never been told she had abnormal liver tests.  Denies abdominal pain, anorexia, abdominal swelling, unusual or excessive bleeding or bruising, pale-colored bowel movements.  Has formed, brown stools every other day.  She has periodically required Feraheme under the guidance of hematologist in [place].  Does not take oral iron.  Not on PPI or H2 blocker.  Patient does not drink alcohol or use excessive amounts of acetaminophen. Family history of a maternal cousin who suffered from some sort of liver disease and was on a transplant list but ended up dying of an MI.   Past Medical History:  Diagnosis Date  . Anemia 02/09/2020  . Anxiety 10/28/2019  . Depressed left ventricular ejection fraction 03/13/2020  . Depressive disorder 10/28/2019  . DOE (dyspnea on exertion) 03/13/2020  . Hypothyroidism 02/09/2020  . Iron deficiency anemia 01/24/2020  . Metabolic syndrome 32/44/0102  . Multinodular goiter 03/07/2015  . PAC (premature atrial contraction) 03/13/2020  . PAT (paroxysmal atrial tachycardia) ([place]) 03/13/2020  . Pericardial effusion 02/09/2020  . 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  Seropositive rheumatoid arthritis ([place]) 02/09/2020  . 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 Type 2 diabetes mellitus ([place]) 05/08/2019  . Vitamin D deficiency 02/09/2020    Past Surgical History:  Procedure Laterality Date  . BUBBLE STUDY  04/24/2020   Procedure: BUBBLE STUDY;  Surgeon: Freada Bergeron, MD;  Location: [facility];  Service: Cardiovascular;;  . CESAREAN SECTION    . CHOLECYSTECTOMY    . other     Growth removal on chest  . TEE WITHOUT CARDIOVERSION N/A 04/24/2020   Procedure: TRANSESOPHAGEAL ECHOCARDIOGRAM (TEE);   Surgeon: Freada Bergeron, MD;  Location: [facility];  Service: Cardiovascular;  Laterality: N/A;  . TOTAL SHOULDER ARTHROPLASTY Left     Prior to Admission medications   Medication Sig Start Date End Date Taking? Authorizing Provider  Abatacept (ORENCIA [place]) Inject into the skin once a week.   Yes [provider]  aspirin EC 81 MG tablet Take 1 tablet (81 mg total) by mouth daily. Swallow whole. 04/12/20  Yes Tobb, Kardie, DO  buPROPion (WELLBUTRIN XL) 150 MG 24 hr tablet Take 150 mg by mouth daily. 02/09/20  Yes [provider]  DULoxetine (CYMBALTA) 60 MG capsule 60 mg. 02/14/20  Yes [provider]  ergocalciferol (VITAMIN D2) 1.25 MG (50000 UT) capsule Take 50,000 Units by mouth once a week.   Yes [provider]  flurbiprofen (ANSAID) 100 MG tablet Take 100 mg by mouth 2 (two) times daily. 01/09/20  Yes [provider]  folic acid (FOLVITE) 1 MG tablet Take 2 mg by mouth daily.   Yes [provider]  HYDROcodone-acetaminophen (NORCO/VICODIN) 5-325 MG tablet Take 1 tablet by mouth every 6 (six) hours as needed for moderate pain. 02/09/20  Yes [provider]  levothyroxine (SYNTHROID) 75 MCG tablet Take 75 mcg by mouth daily before breakfast.   Yes [provider]  metFORMIN (GLUMETZA) 500 MG (MOD) 24 hr tablet Take 500 mg by mouth daily with breakfast.   Yes [provider]  metoprolol succinate (TOPROL XL) 25 MG 24 hr tablet Take 0.5 tablets (12.5 mg total) by mouth daily. 03/13/20  Yes Tobb, Kardie, DO  Multiple Vitamin (MULTIVITAMIN) capsule Take 1 capsule by mouth daily.   Yes [provider]  ORENCIA CLICKJECT 326 MG/ML SOAJ Inject 125 mg into the skin once a week. 01/24/20  Yes [provider]  predniSONE (DELTASONE) 5 MG tablet Take 5 mg by mouth daily. 03/20/20  Yes [provider]  rosuvastatin (CRESTOR) 20 MG tablet Take 1 tablet (20 mg total) by mouth daily. 04/12/20  07/11/20 Yes Tobb, Kardie, DO  telmisartan (MICARDIS) 20 MG tablet Take 20 mg by mouth daily. 01/30/20  Yes [provider]    Scheduled Meds: . apixaban  5 mg Oral BID  . aspirin EC  81 mg Oral Daily  . buPROPion  150 mg Oral Daily  . DULoxetine  60 mg Oral Daily  . folic acid  2 mg Oral Daily  . insulin aspart  0-5 Units Subcutaneous QHS  . insulin aspart  0-9 Units Subcutaneous TID WC  . levothyroxine  75 mcg Oral QAC breakfast  . predniSONE  5 mg Oral Daily   Infusions:  PRN Meds: HYDROcodone-acetaminophen, LORazepam   Allergies as of 04/22/2020 - Review Complete 04/22/2020  Allergen Reaction Noted  . Sulfa antibiotics  01/30/2020  . Sulfamethoxazole-trimethoprim Rash 03/07/2015    Family History  Problem Relation Age of Onset  . Muscular dystrophy Mother   . Diabetes Father   .  Diabetes Brother   . Heart disease Brother     Social History   Socioeconomic History  . Marital status: Married    Spouse name: Not on file  . Number of children: Not on file  . Years of education: Not on file  . Highest education level: Not on file  Occupational History  . Not on file  Tobacco Use  . Smoking status: Former Smoker    Quit date: 02/11/1994    Years since quitting: 26.2  . Smokeless tobacco: Never Used  Vaping Use  . Vaping Use: Never used  Substance and Sexual Activity  . Alcohol use: Not on file  . Drug use: Not on file  . Sexual activity: Not on file  Other Topics Concern  . Not on file  Social History Narrative  . Not on file   Social Determinants of Health   Financial Resource Strain: Not on file  Food Insecurity: Not on file  Transportation Needs: Not on file  Physical Activity: Not on file  Stress: Not on file  Social Connections: Not on file  Intimate Partner Violence: Not on file    REVIEW OF SYSTEMS: Constitutional: Weakness.  Fatigue is improved ENT:  No nose bleeds Pulm: No shortness of breath or cough CV:  No palpitations, no  LE edema.  GU:  No hematuria, no frequency GI: See HPI Heme: Usual or excessive bleeding or bruising.  Periodic Feraheme. Transfusions: No prior blood product transfusions. Neuro: Speech improving, right upper extremity weakness persists.  Able to stand with people by her side but has not been walking or using a walker.  No headaches, no peripheral tingling or numbness.  No syncope, no seizures Derm:  No itching, no rash or sores.  Endocrine:  No sweats or chills.  No polyuria or dysuria Immunization: Not queried. Travel:  None beyond local counties in last few months.    PHYSICAL EXAM: Vital signs in last 24 hours: Vitals:   04/25/20 0800 04/25/20 1200  BP: (!) 86/65 102/81  Pulse:    Resp: 20 20  Temp: 97.6 F (36.4 C)   SpO2: 100% 100%   Wt Readings from Last 3 Encounters:  04/23/20 58.4 kg  03/13/20 55.9 kg  02/12/20 56.7 kg    General: Patient is somewhat ill looking, pleasant, comfortable. Head: No facial asymmetry or swelling.  Some facial rubor. Eyes: No icterus, no pallor Ears: Not hard of hearing Nose: No discharge, no congestion Mouth: Mucosa moist, pink, clear.  Fair dentition.  Tongue deviates slightly towards the right Neck: No mass, no JVD, no thyromegaly Lungs: Clear bilaterally with no labored breathing or cough Heart: Currently NSR in the 80s on telemetry monitor. Abdomen: Soft, nontender, nondistended.  No HSM, masses, bruits, hernias appreciated.   Rectal: Deferred Musc/Skeltl: No joint redness, swelling or gross deformity. Extremities: Slight, nonpitting swelling in the feet and toes.  Toes are violaceous but not cold to the touch. Neurologic: Speech delayed and slightly slurred but she is not hard to understand.  Paucity of speech.  Moves all 4 limbs with weakness in the left upper extremity.  No tremors.  No asterixis. Skin: No rash, no sores, no telangiectasia.  No significant bruising. Tattoos: None observed Nodes: No cervical adenopathy Psych:  Cooperative, calm, pleasant.  Seems in good spirits though affect slow blunted  Intake/Output from previous day: 01/26 0701 - 01/27 0700 In: 400 [I.V.:400] Out: -  Intake/Output this shift: No intake/output data recorded.  LAB RESULTS: Recent Labs  04/22/20 1342 04/23/20 0337 04/24/20 0114  WBC 13.2* 13.0* 12.5*  HGB 14.9 14.5 13.5  HCT 49.0* 47.7* 42.5  PLT 342 319 292   BMET Lab Results  Component Value Date   NA 137 04/25/2020   NA 138 04/24/2020   NA 136 04/23/2020   K 6.0 (H) 04/25/2020   K 3.8 04/24/2020   K 3.5 04/23/2020   CL 104 04/25/2020   CL 101 04/24/2020   CL 99 04/23/2020   CO2 22 04/25/2020   CO2 24 04/24/2020   CO2 24 04/23/2020   GLUCOSE 110 (H) 04/25/2020   GLUCOSE 131 (H) 04/24/2020   GLUCOSE 127 (H) 04/23/2020   BUN 13 04/25/2020   BUN 13 04/24/2020   BUN 13 04/23/2020   CREATININE 0.72 04/25/2020   CREATININE 0.55 04/24/2020   CREATININE 0.64 04/23/2020   CALCIUM 8.3 (L) 04/25/2020   CALCIUM 8.3 (L) 04/24/2020   CALCIUM 8.0 (L) 04/23/2020   LFT Recent Labs    04/23/20 0337 04/24/20 0114 04/25/20 0357  PROT 5.3* 5.1* 4.9*  ALBUMIN 2.7* 2.6* 2.5*  AST 203* 569* 989*  ALT 121* 313* 606*  ALKPHOS 218* 312* 386*  BILITOT 1.1 1.1 1.4*   PT/INR Lab Results  Component Value Date   INR 1.3 (H) 04/22/2020   Hepatitis Panel Recent Labs    04/23/20 0337  HEPBSAG NON REACTIVE  HCVAB NON REACTIVE  HEPAIGM NON REACTIVE  HEPBIGM NON REACTIVE   C-Diff No components found for: CDIFF Lipase  No results found for: LIPASE  Drugs of Abuse     Component Value Date/Time   LABOPIA POSITIVE (A) 04/23/2020 0139   COCAINSCRNUR NONE DETECTED 04/23/2020 0139   LABBENZ NONE DETECTED 04/23/2020 0139   AMPHETMU NONE DETECTED 04/23/2020 0139   THCU NONE DETECTED 04/23/2020 0139   LABBARB NONE DETECTED 04/23/2020 0139     RADIOLOGY STUDIES: ECHOCARDIOGRAM COMPLETE  Result Date: 04/23/2020    ECHOCARDIOGRAM REPORT   Patient Name:    Floyd Date of Exam: 04/23/2020 Medical Rec #:  [id number]                     Height:       60.0 in Accession #:    [id number]                    Weight:       123.0 lb Date of Birth:  07-27-1960                    BSA:          1.518 m Patient Age:    63 years                      BP:           115/102 mmHg Patient Gender: F                             HR:           107 bpm. Exam Location:  Inpatient Procedure: 2D Echo and Color Doppler Indications:    Stroke 434.91 / I163.9  History:        Patient has prior history of Echocardiogram examinations, most                 recent 03/11/2020. Signs/Symptoms:Dyspnea; Risk  Factors:Diabetes. Hypothyroidism.  Sonographer:    Tiffany Dance Referring Phys: [id number] RONDELL A SMITH IMPRESSIONS  1. Left ventricular ejection fraction, by estimation, is 20 to 25%. The left ventricle has severely decreased function. The left ventricle demonstrates global hypokinesis. Left ventricular diastolic parameters are indeterminate.  2. Right ventricular systolic function is normal. The right ventricular size is normal.  3. Left atrial size was moderately dilated.  4. The mitral valve is normal in structure. Mild mitral valve regurgitation. No evidence of mitral stenosis.  5. The aortic valve is tricuspid. Aortic valve regurgitation is not visualized. No aortic stenosis is present.  6. The inferior vena cava is normal in size with greater than 50% respiratory variability, suggesting right atrial pressure of 3 mmHg. FINDINGS  Left Ventricle: Left ventricular ejection fraction, by estimation, is 20 to 25%. The left ventricle has severely decreased function. The left ventricle demonstrates global hypokinesis. The left ventricular internal cavity size was normal in size. There is no left ventricular hypertrophy. Left ventricular diastolic parameters are indeterminate. Right Ventricle: The right ventricular size is normal. Right ventricular systolic function  is normal. Left Atrium: Left atrial size was moderately dilated. Right Atrium: Right atrial size was normal in size. Pericardium: There is no evidence of pericardial effusion. Mitral Valve: The mitral valve is normal in structure. Mild mitral annular calcification. Mild mitral valve regurgitation. No evidence of mitral valve stenosis. Tricuspid Valve: The tricuspid valve is normal in structure. Tricuspid valve regurgitation is trivial. No evidence of tricuspid stenosis. Aortic Valve: The aortic valve is tricuspid. Aortic valve regurgitation is not visualized. No aortic stenosis is present. Pulmonic Valve: The pulmonic valve was grossly normal. Pulmonic valve regurgitation is not visualized. No evidence of pulmonic stenosis. Aorta: The aortic root is normal in size and structure. Venous: The inferior vena cava is normal in size with greater than 50% respiratory variability, suggesting right atrial pressure of 3 mmHg. IAS/Shunts: No atrial level shunt detected by color flow Doppler.  LEFT VENTRICLE PLAX 2D LVIDd:         4.70 cm LVIDs:         4.00 cm LV PW:         1.20 cm LV IVS:        0.70 cm LVOT diam:     1.90 cm LV SV:         19 LV SV Index:   13 LVOT Area:     2.84 cm  RIGHT VENTRICLE            IVC RV Basal diam:  2.20 cm    IVC diam: 2.10 cm RV S prime:     6.42 cm/s TAPSE (M-mode): 1.2 cm LEFT ATRIUM             Index       RIGHT ATRIUM           Index LA diam:        5.10 cm 3.36 cm/m  RA Area:     12.90 cm LA Vol (A2C):   68.6 ml 45.19 ml/m RA Volume:   27.30 ml  17.98 ml/m LA Vol (A4C):   64.6 ml 42.55 ml/m LA Biplane Vol: 67.3 ml 44.33 ml/m  AORTIC VALVE LVOT Vmax:   48.75 cm/s LVOT Vmean:  30.800 cm/s LVOT VTI:    0.068 m  AORTA Ao Root diam: 2.70 cm Ao Asc diam:  3.40 cm MITRAL VALVE MV Area (PHT): 5.13 cm    SHUNTS MV Decel Time:  148 msec    Systemic VTI:  0.07 m MV E velocity: 92.40 cm/s  Systemic Diam: 1.90 cm MV A velocity: 71.10 cm/s MV E/A ratio:  1.30 Kirk Ruths MD Electronically  signed by Kirk Ruths MD Signature Date/Time: 04/23/2020/2:25:22 PM    Final    ECHO TEE  Result Date: 04/24/2020    TRANSESOPHOGEAL ECHO REPORT   Patient Name:   Marya Amsler Date of Exam: 04/24/2020 Medical Rec #:  [id number]                     Height:       60.0 in Accession #:    [id number]                    Weight:       128.7 lb Date of Birth:  1960/03/31                    BSA:          1.548 m Patient Age:    59 years                      BP:           135/98 mmHg Patient Gender: F                             HR:           90 bpm. Exam Location:  Inpatient Procedure: Transesophageal Echo, 3D Echo, Color Doppler and Cardiac Doppler Indications:     Stroke i163.9  History:         Patient has prior history of Echocardiogram examinations, most                  recent 04/23/2020. Risk Factors:Hypertension and Diabetes.  Sonographer:     Raquel Sarna Senior RDCS Referring Phys:  [id number] Greer Ee [place] Diagnosing Phys: Gwyndolyn Kaufman MD PROCEDURE: After discussion of the risks and benefits of a TEE, an informed consent was obtained from the patient. The transesophogeal probe was passed without difficulty through the esophogus of the patient. Local oropharyngeal anesthetic was provided with Cetacaine. Sedation performed by different physician. The patient was monitored while under deep sedation. Anesthestetic sedation was provided intravenously by Anesthesiology: 68m of Propofol. The patient developed no complications during the procedure. IMPRESSIONS  1. Left ventricular ejection fraction, by estimation, is 20 to 25%. The left ventricle has severely decreased function. The left ventricle demonstrates global hypokinesis.  2. Right ventricular systolic function is moderately reduced. The right ventricular size is normal.  3. Left atrial size was moderately dilated. No left atrial/left atrial appendage thrombus was detected. The LAA emptying velocity was 30 cm/s.  4. Right atrial size was  mildly dilated.  5. The mitral valve is degenerative. Mild to moderate mitral valve regurgitation.  6. The aortic valve is tricuspid. There is mild calcification of the aortic valve. There is mild thickening of the aortic valve. Aortic valve regurgitation is trivial.  7. 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  8. A small PFO was detected by color flow Doppler with predominant L-->R shunting, however, there is flow reversal (R-->L) with valsalva.  9. Agitated saline contrast bubble study was positive with shunting observed within 3-6 cardiac cycles with valsalva suggestive of interatrial shunt. FINDINGS  Left  Ventricle: Left ventricular ejection fraction, by estimation, is 20 to 25%. The left ventricle has severely decreased function. The left ventricle demonstrates global hypokinesis. The left ventricular internal cavity size was normal in size. Right Ventricle: The right ventricular size is normal. No increase in right ventricular wall thickness. Right ventricular systolic function is moderately reduced. Left Atrium: Left atrial size was moderately dilated. No left atrial/left atrial appendage thrombus was detected. The LAA emptying velocity was 30 cm/s. Right Atrium: Right atrial size was mildly dilated. Pericardium: There is no evidence of pericardial effusion. Mitral Valve: The mitral valve is degenerative in appearance. There is mild thickening of the mitral valve leaflet(s). There is mild calcification of the mitral valve leaflet(s). Mild to moderate mitral valve regurgitation. Tricuspid Valve: The tricuspid valve is normal in structure. Tricuspid valve regurgitation is mild. Aortic Valve: The aortic valve is tricuspid. There is mild calcification of the aortic valve. There is mild thickening of the aortic valve. Aortic valve regurgitation is trivial. The is small fibrinous stranding visualized on the aortic valve leaflet consistent  with a Lambl's excresence. Pulmonic Valve: The pulmonic valve was normal in structure. Pulmonic valve regurgitation is trivial. Aorta: The aortic root and ascending aorta are structurally normal, with no evidence of dilitation. IAS/Shunts: Evidence of atrial level shunting detected by color flow Doppler. Agitated saline contrast was given intravenously to evaluate for intracardiac shunting. Agitated saline contrast bubble study was positive with shunting observed within 3-6 cardiac cycles suggestive of interatrial shunt. A small patent foramen ovale is detected. Gwyndolyn Kaufman MD Electronically signed by Gwyndolyn Kaufman MD Signature Date/Time: 04/24/2020/9:38:38 AM    Final    US Abdomen Limited RUQ (LIVER/GB)  Result Date: 04/24/2020 CLINICAL DATA:  Elevated liver enzymes EXAM: ULTRASOUND ABDOMEN LIMITED RIGHT UPPER QUADRANT COMPARISON:  None. FINDINGS: Gallbladder: Surgically absent. Common bile duct: Diameter: 5 mm. No intrahepatic or extrahepatic biliary duct dilatation. Liver: No focal lesion identified. Within normal limits in parenchymal echogenicity. Portal vein is patent on color Doppler imaging with normal direction of blood flow towards the liver. Other: None. IMPRESSION: Gallbladder absent.  Study otherwise unremarkable. Electronically Signed   By: Lowella Grip III M.D.   On: 04/24/2020 15:05     IMPRESSION:   *    Rising LFTs with normal liver parenchyma and biliary tree per ultrasound. Crestor discontinued 3 d ago.  Acute hepatitis A/B/C serologies nonreactive. Autoimmune labs pending include ANA, smooth muscle antibody, microsomal antibody.  Ceruloplasmin, TTG pending Potential hepatotoxic drugs on her medicine list include bupropion, Cymbalta, metoprolol, Metformin  *    Cardioembolic CVA.  Eliquis day 2.  Received 2 doses of Plavix but now discontinued.  On low-dose aspirin.  *   CHF.  LVEF has dropped from 40-45 to 20 to 25% over course of 6 weeks.    *   Hyperkalemia.   K6.0.  *   Suspected CAD, cardiac cath has been on hold due to stroke.  *     Rheumatoid arthritis, chronic low-dose prednisone and Orencia.  *   Hypothyroidism, on Synthroid.  History multinodular goiter.  *   [place]eline anemia.  Chronic anemia.  Previous outpt Feraheme infusions per heme in [place].   PLAN:     *    LFTs and INR in the morning. Await results of autoimmune markers.   ? Need for liver biopsy.  This is risky and problematic given she is now on Eliquis.    Azucena Freed  04/25/2020, 12:32 PM Phone [phone]  [phone]  GI ATTENDING  History, laboratories, x-rays reviewed.  Patient personally seen and examined.  Phlebotomist drawing blood at this time.  Agree with comprehensive consultation note as outlined above.  Patient presents with dizziness.  Has been found to have a stroke.  Also found to have worsening congestive heart failure and patent foramen ovale.  Liver tests abruptly rise after admission.  I suspect this is secondary to toxic medication reaction or congestive hepatopathy.  No evidence of hepatic synthetic dysfunction.  No evidence for acute hepatitis.  Other etiologies are being assessed for possible cause.  Her statin was recently discontinued.  Ultrasound with Doppler was unremarkable.  Continue to treat her underlying medical problems.  At present, she is currently on no medications which put her liver at risk.  Continue to monitor her liver tests with time as well as her PT/INR.  No plans for liver biopsy at this time as it would not alter clinical management.  We will continue to follow.  Thank you.  Docia Chuck. Geri [place]in[place]., M.D. [facility] Division of Gastroenterology

## 2020-04-25 NOTE — Progress Notes (Signed)
   Cardiology asked to place order for 30 day monitor to further evaluate etiology of her recent CVA. Will place order for Dr. Servando Salina to read.   Beatriz Stallion, PA-C 04/25/20; 12:45 PM

## 2020-04-26 ENCOUNTER — Encounter (HOSPITAL_COMMUNITY): Payer: Self-pay | Admitting: Physical Medicine & Rehabilitation

## 2020-04-26 ENCOUNTER — Inpatient Hospital Stay (HOSPITAL_COMMUNITY)
Admission: RE | Admit: 2020-04-26 | Discharge: 2020-05-16 | DRG: 057 | Disposition: A | Discharge status: Home Health | Payer: Medicare PPO | Source: Intra-hospital | Attending: Physical Medicine & Rehabilitation | Admitting: Physical Medicine & Rehabilitation

## 2020-04-26 ENCOUNTER — Other Ambulatory Visit: Payer: Self-pay

## 2020-04-26 DIAGNOSIS — Z79899 Other long term (current) drug therapy: Secondary | ICD-10-CM

## 2020-04-26 DIAGNOSIS — M25551 Pain in right hip: Secondary | ICD-10-CM | POA: Diagnosis not present

## 2020-04-26 DIAGNOSIS — D509 Iron deficiency anemia, unspecified: Secondary | ICD-10-CM | POA: Diagnosis present

## 2020-04-26 DIAGNOSIS — R7309 Other abnormal glucose: Secondary | ICD-10-CM

## 2020-04-26 DIAGNOSIS — G4700 Insomnia, unspecified: Secondary | ICD-10-CM | POA: Diagnosis present

## 2020-04-26 DIAGNOSIS — I69354 Hemiplegia and hemiparesis following cerebral infarction affecting left non-dominant side: Secondary | ICD-10-CM | POA: Diagnosis not present

## 2020-04-26 DIAGNOSIS — M818 Other osteoporosis without current pathological fracture: Secondary | ICD-10-CM | POA: Diagnosis present

## 2020-04-26 DIAGNOSIS — I63511 Cerebral infarction due to unspecified occlusion or stenosis of right middle cerebral artery: Secondary | ICD-10-CM

## 2020-04-26 DIAGNOSIS — E11649 Type 2 diabetes mellitus with hypoglycemia without coma: Secondary | ICD-10-CM | POA: Diagnosis not present

## 2020-04-26 DIAGNOSIS — E1159 Type 2 diabetes mellitus with other circulatory complications: Secondary | ICD-10-CM

## 2020-04-26 DIAGNOSIS — E8881 Metabolic syndrome: Secondary | ICD-10-CM | POA: Diagnosis present

## 2020-04-26 DIAGNOSIS — Z9181 History of falling: Secondary | ICD-10-CM

## 2020-04-26 DIAGNOSIS — F05 Delirium due to known physiological condition: Secondary | ICD-10-CM | POA: Diagnosis not present

## 2020-04-26 DIAGNOSIS — E119 Type 2 diabetes mellitus without complications: Secondary | ICD-10-CM | POA: Diagnosis not present

## 2020-04-26 DIAGNOSIS — Z9049 Acquired absence of other specified parts of digestive tract: Secondary | ICD-10-CM

## 2020-04-26 DIAGNOSIS — G479 Sleep disorder, unspecified: Secondary | ICD-10-CM

## 2020-04-26 DIAGNOSIS — I5043 Acute on chronic combined systolic (congestive) and diastolic (congestive) heart failure: Secondary | ICD-10-CM | POA: Diagnosis not present

## 2020-04-26 DIAGNOSIS — S0990XA Unspecified injury of head, initial encounter: Secondary | ICD-10-CM | POA: Diagnosis not present

## 2020-04-26 DIAGNOSIS — F418 Other specified anxiety disorders: Secondary | ICD-10-CM | POA: Diagnosis not present

## 2020-04-26 DIAGNOSIS — K921 Melena: Secondary | ICD-10-CM | POA: Diagnosis not present

## 2020-04-26 DIAGNOSIS — M069 Rheumatoid arthritis, unspecified: Secondary | ICD-10-CM | POA: Diagnosis present

## 2020-04-26 DIAGNOSIS — L259 Unspecified contact dermatitis, unspecified cause: Secondary | ICD-10-CM | POA: Diagnosis present

## 2020-04-26 DIAGNOSIS — N390 Urinary tract infection, site not specified: Secondary | ICD-10-CM | POA: Diagnosis not present

## 2020-04-26 DIAGNOSIS — I480 Paroxysmal atrial fibrillation: Secondary | ICD-10-CM | POA: Diagnosis present

## 2020-04-26 DIAGNOSIS — Q2112 Patent foramen ovale: Secondary | ICD-10-CM

## 2020-04-26 DIAGNOSIS — I5042 Chronic combined systolic (congestive) and diastolic (congestive) heart failure: Secondary | ICD-10-CM | POA: Diagnosis present

## 2020-04-26 DIAGNOSIS — I471 Supraventricular tachycardia: Secondary | ICD-10-CM | POA: Diagnosis not present

## 2020-04-26 DIAGNOSIS — I2510 Atherosclerotic heart disease of native coronary artery without angina pectoris: Secondary | ICD-10-CM | POA: Diagnosis present

## 2020-04-26 DIAGNOSIS — M4802 Spinal stenosis, cervical region: Secondary | ICD-10-CM | POA: Diagnosis present

## 2020-04-26 DIAGNOSIS — E871 Hypo-osmolality and hyponatremia: Secondary | ICD-10-CM | POA: Diagnosis not present

## 2020-04-26 DIAGNOSIS — Z833 Family history of diabetes mellitus: Secondary | ICD-10-CM

## 2020-04-26 DIAGNOSIS — F419 Anxiety disorder, unspecified: Secondary | ICD-10-CM

## 2020-04-26 DIAGNOSIS — K922 Gastrointestinal hemorrhage, unspecified: Secondary | ICD-10-CM

## 2020-04-26 DIAGNOSIS — Q211 Atrial septal defect: Secondary | ICD-10-CM

## 2020-04-26 DIAGNOSIS — Z7952 Long term (current) use of systemic steroids: Secondary | ICD-10-CM

## 2020-04-26 DIAGNOSIS — Z87891 Personal history of nicotine dependence: Secondary | ICD-10-CM

## 2020-04-26 DIAGNOSIS — K5900 Constipation, unspecified: Secondary | ICD-10-CM | POA: Diagnosis present

## 2020-04-26 DIAGNOSIS — R7401 Elevation of levels of liver transaminase levels: Secondary | ICD-10-CM | POA: Diagnosis present

## 2020-04-26 DIAGNOSIS — E039 Hypothyroidism, unspecified: Secondary | ICD-10-CM

## 2020-04-26 DIAGNOSIS — Z882 Allergy status to sulfonamides status: Secondary | ICD-10-CM

## 2020-04-26 DIAGNOSIS — M059 Rheumatoid arthritis with rheumatoid factor, unspecified: Secondary | ICD-10-CM | POA: Diagnosis present

## 2020-04-26 DIAGNOSIS — I10 Essential (primary) hypertension: Secondary | ICD-10-CM

## 2020-04-26 DIAGNOSIS — R410 Disorientation, unspecified: Secondary | ICD-10-CM | POA: Diagnosis not present

## 2020-04-26 DIAGNOSIS — Z79890 Hormone replacement therapy (postmenopausal): Secondary | ICD-10-CM

## 2020-04-26 DIAGNOSIS — R7989 Other specified abnormal findings of blood chemistry: Secondary | ICD-10-CM | POA: Diagnosis not present

## 2020-04-26 DIAGNOSIS — I6340 Cerebral infarction due to embolism of unspecified cerebral artery: Secondary | ICD-10-CM | POA: Diagnosis not present

## 2020-04-26 DIAGNOSIS — Z7901 Long term (current) use of anticoagulants: Secondary | ICD-10-CM

## 2020-04-26 DIAGNOSIS — Z7984 Long term (current) use of oral hypoglycemic drugs: Secondary | ICD-10-CM

## 2020-04-26 DIAGNOSIS — E162 Hypoglycemia, unspecified: Secondary | ICD-10-CM | POA: Diagnosis not present

## 2020-04-26 DIAGNOSIS — Z7982 Long term (current) use of aspirin: Secondary | ICD-10-CM

## 2020-04-26 DIAGNOSIS — I110 Hypertensive heart disease with heart failure: Secondary | ICD-10-CM | POA: Diagnosis present

## 2020-04-26 DIAGNOSIS — Z8249 Family history of ischemic heart disease and other diseases of the circulatory system: Secondary | ICD-10-CM

## 2020-04-26 DIAGNOSIS — Z96612 Presence of left artificial shoulder joint: Secondary | ICD-10-CM | POA: Diagnosis present

## 2020-04-26 DIAGNOSIS — E785 Hyperlipidemia, unspecified: Secondary | ICD-10-CM | POA: Diagnosis present

## 2020-04-26 DIAGNOSIS — W19XXXA Unspecified fall, initial encounter: Secondary | ICD-10-CM

## 2020-04-26 DIAGNOSIS — G8929 Other chronic pain: Secondary | ICD-10-CM | POA: Diagnosis present

## 2020-04-26 DIAGNOSIS — Z043 Encounter for examination and observation following other accident: Secondary | ICD-10-CM | POA: Diagnosis not present

## 2020-04-26 LAB — GLUCOSE, CAPILLARY
Glucose-Capillary: 106 mg/dL — ABNORMAL HIGH (ref 70–99)
Glucose-Capillary: 94 mg/dL (ref 70–99)
Glucose-Capillary: 155 mg/dL — ABNORMAL HIGH (ref 70–99)
Glucose-Capillary: 110 mg/dL — ABNORMAL HIGH (ref 70–99)

## 2020-04-26 LAB — COMPREHENSIVE METABOLIC PANEL
ALT: 608 U/L — ABNORMAL HIGH (ref 0–44)
AST: 501 U/L — ABNORMAL HIGH (ref 15–41)
Albumin: 2.8 g/dL — ABNORMAL LOW (ref 3.5–5.0)
Alkaline Phosphatase: 468 U/L — ABNORMAL HIGH (ref 38–126)
Anion gap: 13 (ref 5–15)
BUN: 10 mg/dL (ref 6–20)
CO2: 27 mmol/L (ref 22–32)
Calcium: 8.7 mg/dL — ABNORMAL LOW (ref 8.9–10.3)
Chloride: 98 mmol/L (ref 98–111)
Creatinine, Ser: 0.61 mg/dL (ref 0.44–1.00)
GFR, Estimated: >60 mL/min (ref >60)
Glucose, Bld: 102 mg/dL — ABNORMAL HIGH (ref 70–99)
Potassium: 4.1 mmol/L (ref 3.5–5.1)
Sodium: 138 mmol/L (ref 135–145)
Total Bilirubin: 1.4 mg/dL — ABNORMAL HIGH (ref 0.3–1.2)
Total Protein: 5.3 g/dL — ABNORMAL LOW (ref 6.5–8.1)

## 2020-04-26 LAB — PROTIME-INR
INR: 2 — ABNORMAL HIGH (ref 0.8–1.2)
Prothrombin Time: 21.7 seconds — ABNORMAL HIGH (ref 11.4–15.2)

## 2020-04-26 LAB — TISSUE TRANSGLUTAMINASE, IGA: Tissue Transglutaminase Ab, IgA: <2 U/mL (ref 0–3)

## 2020-04-26 LAB — ANTI-SMOOTH MUSCLE ANTIBODY, IGG: F-Actin IgG: 3 Units (ref 0–19)

## 2020-04-26 LAB — ANTI-MICROSOMAL ANTIBODY LIVER / KIDNEY: LKM1 Ab: 0.9 Units (ref 0.0–20.0)

## 2020-04-26 LAB — T3, FREE: T3, Free: 2.1 pg/mL (ref 2.0–4.4)

## 2020-04-26 LAB — CERULOPLASMIN: Ceruloplasmin: 43.7 mg/dL — ABNORMAL HIGH (ref 19.0–39.0)

## 2020-04-26 LAB — ANA W/REFLEX IF POSITIVE: Anti Nuclear Antibody (ANA): NEGATIVE

## 2020-04-26 MED ORDER — LEVOTHYROXINE SODIUM 75 MCG PO TABS
75.0000 ug | ORAL_TABLET | Freq: Every day | ORAL | Status: DC
  Administered 2020-04-27 – 2020-05-16 (×20): 75 ug via ORAL
  Filled 2020-04-26 (×20): qty 1

## 2020-04-26 MED ORDER — FOLIC ACID 1 MG PO TABS
2.0000 mg | ORAL_TABLET | Freq: Every day | ORAL | Status: DC
  Administered 2020-04-27 – 2020-05-16 (×20): 2 mg via ORAL
  Filled 2020-04-26 (×20): qty 2

## 2020-04-26 MED ORDER — DULOXETINE HCL 60 MG PO CPEP
60.0000 mg | ORAL_CAPSULE | Freq: Every day | ORAL | Status: DC
  Administered 2020-04-27 – 2020-05-15 (×19): 60 mg via ORAL
  Filled 2020-04-26 (×19): qty 1

## 2020-04-26 MED ORDER — ASPIRIN EC 81 MG PO TBEC
81.0000 mg | DELAYED_RELEASE_TABLET | Freq: Every day | ORAL | Status: DC
  Administered 2020-04-27 – 2020-05-16 (×19): 81 mg via ORAL
  Filled 2020-04-26 (×20): qty 1

## 2020-04-26 MED ORDER — METOPROLOL SUCCINATE ER 25 MG PO TB24
12.5000 mg | ORAL_TABLET | Freq: Every day | ORAL | Status: DC
  Administered 2020-04-26: 12.5 mg via ORAL
  Filled 2020-04-26: qty 1

## 2020-04-26 MED ORDER — LORAZEPAM 0.5 MG PO TABS
0.5000 mg | ORAL_TABLET | Freq: Three times a day (TID) | ORAL | Status: DC | PRN
  Administered 2020-04-29 – 2020-05-08 (×4): 0.5 mg via ORAL
  Filled 2020-04-26 (×5): qty 1

## 2020-04-26 MED ORDER — PREDNISONE 5 MG PO TABS
5.0000 mg | ORAL_TABLET | Freq: Every day | ORAL | Status: DC
  Administered 2020-04-27: 5 mg via ORAL
  Filled 2020-04-26: qty 1

## 2020-04-26 MED ORDER — BUPROPION HCL ER (XL) 150 MG PO TB24
150.0000 mg | ORAL_TABLET | Freq: Every day | ORAL | Status: DC
  Administered 2020-04-27 – 2020-05-16 (×20): 150 mg via ORAL
  Filled 2020-04-26 (×22): qty 1

## 2020-04-26 MED ORDER — METOPROLOL SUCCINATE ER 25 MG PO TB24
12.5000 mg | ORAL_TABLET | Freq: Every day | ORAL | Status: DC
  Administered 2020-04-27 – 2020-05-08 (×11): 12.5 mg via ORAL
  Filled 2020-04-26 (×12): qty 1

## 2020-04-26 MED ORDER — INSULIN ASPART 100 UNIT/ML ~~LOC~~ SOLN
0.0000 [IU] | Freq: Three times a day (TID) | SUBCUTANEOUS | Status: DC
  Administered 2020-04-27: 3 [IU] via SUBCUTANEOUS
  Administered 2020-04-28: 1 [IU] via SUBCUTANEOUS
  Administered 2020-04-28: 3 [IU] via SUBCUTANEOUS
  Administered 2020-04-28: 2 [IU] via SUBCUTANEOUS
  Administered 2020-04-29 (×3): 1 [IU] via SUBCUTANEOUS
  Administered 2020-04-30 (×2): 2 [IU] via SUBCUTANEOUS
  Administered 2020-05-01: 1 [IU] via SUBCUTANEOUS
  Administered 2020-05-01: 3 [IU] via SUBCUTANEOUS
  Administered 2020-05-01 – 2020-05-02 (×2): 1 [IU] via SUBCUTANEOUS
  Administered 2020-05-02: 2 [IU] via SUBCUTANEOUS
  Administered 2020-05-03 (×2): 1 [IU] via SUBCUTANEOUS
  Administered 2020-05-04: 2 [IU] via SUBCUTANEOUS

## 2020-04-26 MED ORDER — HYDROCODONE-ACETAMINOPHEN 5-325 MG PO TABS
1.0000 | ORAL_TABLET | Freq: Four times a day (QID) | ORAL | Status: DC | PRN
  Administered 2020-04-26 – 2020-05-09 (×16): 1 via ORAL
  Filled 2020-04-26 (×16): qty 1

## 2020-04-26 MED ORDER — APIXABAN 5 MG PO TABS
5.0000 mg | ORAL_TABLET | Freq: Two times a day (BID) | ORAL | Status: DC
  Administered 2020-04-26 – 2020-05-03 (×15): 5 mg via ORAL
  Filled 2020-04-26 (×16): qty 1

## 2020-04-26 MED ORDER — METOPROLOL SUCCINATE ER 25 MG PO TB24: 12.5000 mg | ORAL_TABLET | Freq: Every day | ORAL | Status: DC

## 2020-04-26 MED ORDER — APIXABAN 5 MG PO TABS: 5.0000 mg | ORAL_TABLET | Freq: Two times a day (BID) | ORAL | Status: DC

## 2020-04-26 NOTE — TOC Transition Note (Signed)
Transition of Care [facility]) - CM/SW Discharge Note   Patient Details  Name: Madison Hurst MRN: [id number] Date of Birth: [birth year]  [facility] CM/SW Contact:  Kermit Balo, RN Phone Number: 04/26/2020, 3:50 PM   Clinical Narrative:    Pt is discharging to CIR today. CM signing off.   Final next level of care: IP Rehab Facility Barriers to Discharge: No Barriers Identified   Patient Goals and CMS Choice        Discharge Placement                       Discharge Plan and Services                                     Social Determinants of Health (SDOH) Interventions     Readmission Risk Interventions No flowsheet data found.

## 2020-04-26 NOTE — H&P (Signed)
Physical Medicine and Rehabilitation Admission H&P    Chief Complaint  Patient presents with  . Weakness    Generalized weakness and "not herself" x 2 weeks.  Fall today  : HPI: Madison Hurst is a 60 year old right-handed female with history of paroxysmal atrial tachycardia followed by Dr.Kardie Tobb, CAD, hypertension, diabetes mellitus, chronic anemia, hyperlipidemia, rheumatoid arthritis maintained on chronic prednisone, anxiety and depression.  History taken from chart review and patient.  Patient lives with spouse.  1 level home 2 steps to entry.  Patient reports ambulating independently but with poor balance.  She presented on 04/22/2018 with progressive left hemiparesis over 1 week resulting in 3 falls.  Admission chemistries glucose 173, AST 65, ALT 62, alkaline phosphatase, alcohol 162, negative hemoglobin A1c 6.1.  Cranial CT unremarkable for acute intracranial process.  Patient did not receive TPA.  MRI showed acute infarction affecting the right mid posterior caudate and right superior thalamus.  Single separate punctate acute infarct in the cortex of the medial right parieto-occipital junction.  Few small punctate acute infarcts in the right occipital cortex.  No evidence of hemorrhage or mass-effect.  CT angiogram of head and neck mild stenosis of the left PCA P3 segment.  No emergent large vessel occlusion.  MRI cervical spine showed apparent moderate to severe cervical spinal stenosis at C4-C5 and C5-C6, and moderate at C3-4.  Multilevel neuroforaminal narrowing greatest at C3-4 and on the right C4-5.  Echocardiogram with ejection fraction of 20-25%, with left ventricle global hypokinesis. TEE showed mild to moderate MR.  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  No LAA thrombus.  Lower extremity Dopplers negative.  Orders were given for a video monitor to further evaluate etiology of her CVA.  Currently maintained on aspirin 81 mg daily as well as Eliquis.   LFTs have continued to be elevated and monitored.  Acute hepatitis panel negative.  Her Crestor was discontinued due to elevated LFTs.  Ultrasound of the abdomen showed no focal lesion identified.  Gallbladder was absent.  Study otherwise unremarkable.  Autoimmune markers are pending.  There is some question of possible need for liver biopsy however felt to be somewhat risky while maintained on Eliquis gastroenterology services Dr. Marina Goodell has been consulted to follow rising LFTs.  Due to patient's progressive left hemiparesis, she was admitted for a comprehensive rehab program.  Please see preadmission assessment from earlier today as well.  Review of Systems  Constitutional: Positive for malaise/fatigue. Negative for chills and fever.  HENT: Negative for hearing loss.   Eyes: Negative for blurred vision and double vision.  Respiratory: Negative for cough and shortness of breath.   Cardiovascular: Negative for chest pain and palpitations.  Gastrointestinal: Positive for constipation. Negative for heartburn, nausea and vomiting.  Genitourinary: Negative for dysuria, flank pain and hematuria.  Musculoskeletal: Positive for joint pain and myalgias.  Skin: Negative for rash.  Neurological: Positive for dizziness and weakness.  Psychiatric/Behavioral: Positive for depression. The patient has insomnia.        Anxiety  All other systems reviewed and are negative.  Past Medical History:  Diagnosis Date  . Anemia 02/09/2020  . Anxiety 10/28/2019  . Depressed left ventricular ejection fraction 03/13/2020  . Depressive disorder 10/28/2019  . DOE (dyspnea on exertion) 03/13/2020  . Hypothyroidism 02/09/2020  . Iron deficiency anemia 01/24/2020  . Metabolic syndrome 03/13/2020  . Multinodular goiter 03/07/2015  . PAC (premature atrial contraction) 03/13/2020  . PAT (paroxysmal atrial tachycardia) (HCC) 03/13/2020  .  Pericardial effusion 02/09/2020  . 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 Type 2 diabetes mellitus (HCC) 05/08/2019  . Vitamin D deficiency 02/09/2020   Past Surgical History:  Procedure Laterality Date  . BUBBLE STUDY  04/24/2020   Procedure: BUBBLE STUDY;  Surgeon: Meriam Sprague, MD;  Location: [facility] ENDOSCOPY;  Service: Cardiovascular;;  . CESAREAN SECTION    . CHOLECYSTECTOMY    . other     Growth removal on chest  . TEE WITHOUT CARDIOVERSION N/A 04/24/2020   Procedure: TRANSESOPHAGEAL ECHOCARDIOGRAM (TEE);  Surgeon: Meriam Sprague, MD;  Location: [facility] ENDOSCOPY;  Service: Cardiovascular;  Laterality: N/A;  . TOTAL SHOULDER ARTHROPLASTY Left    Family History  Problem Relation Age of Onset  . Muscular dystrophy Mother   . Diabetes Father   . Diabetes Brother   . Heart disease Brother    Social History:  reports that she quit smoking about 26 years ago. She has never used smokeless tobacco. No history on file for alcohol use and drug use. Allergies:  Allergies  Allergen Reactions  . Sulfa Antibiotics   . Sulfamethoxazole-Trimethoprim Rash   Medications Prior to Admission  Medication Sig Dispense Refill  . Abatacept (ORENCIA Brimson) Inject into the skin once a week.    Marland Kitchen aspirin EC 81 MG tablet Take 1 tablet (81 mg total) by mouth daily. Swallow whole. 90 tablet 3  . buPROPion (WELLBUTRIN XL) 150 MG 24 hr tablet Take 150 mg by mouth daily.    . DULoxetine (CYMBALTA) 60 MG capsule 60 mg.    . ergocalciferol (VITAMIN D2) 1.25 MG (50000 UT) capsule Take 50,000 Units by mouth once a week.    . flurbiprofen (ANSAID) 100 MG tablet Take 100 mg by mouth 2 (two) times daily.    . folic acid (FOLVITE) 1 MG tablet Take 2 mg by mouth daily.    Marland Kitchen HYDROcodone-acetaminophen (NORCO/VICODIN) 5-325 MG tablet Take 1 tablet by mouth every 6 (six) hours as needed for moderate pain.    Marland Kitchen levothyroxine (SYNTHROID) 75 MCG tablet Take 75 mcg by mouth daily before breakfast.    .  metFORMIN (GLUMETZA) 500 MG (MOD) 24 hr tablet Take 500 mg by mouth daily with breakfast.    . metoprolol succinate (TOPROL XL) 25 MG 24 hr tablet Take 0.5 tablets (12.5 mg total) by mouth daily. 30 tablet 3  . Multiple Vitamin (MULTIVITAMIN) capsule Take 1 capsule by mouth daily.    Marland Kitchen ORENCIA CLICKJECT 125 MG/ML SOAJ Inject 125 mg into the skin once a week.    . predniSONE (DELTASONE) 5 MG tablet Take 5 mg by mouth daily.    . rosuvastatin (CRESTOR) 20 MG tablet Take 1 tablet (20 mg total) by mouth daily. 90 tablet 3  . telmisartan (MICARDIS) 20 MG tablet Take 20 mg by mouth daily.      Drug Regimen Review Drug regimen was reviewed and remains appropriate with no significant issues identified  Home: Home Living Family/patient expects to be discharged to:: Private residence Living Arrangements: Spouse/significant other Available Help at Discharge: Family,Available PRN/intermittently Type of Home: House Home Access: Stairs to enter [employer] of Steps: 2 Entrance Stairs-Rails: Can reach both Home Layout: One level Bathroom Shower/Tub: Engineer, manufacturing systems: Standard Home Equipment: Gilmer Mor - single point,Wheelchair - manual,Shower seat,Bedside commode Additional Comments: husband supportive at bedside  Lives With: Spouse,Son   Functional History: Prior Function Level of Independence: Needs assistance Gait /  Transfers Assistance Needed: pt reports being indep for ADLs' and mobility; but noted that reporting to PT services that she require A for picking items up off the floor ADL's / Homemaking Assistance Needed: intermittent min A  Functional Status:  Mobility: Bed Mobility Overal bed mobility: Needs Assistance Bed Mobility: Supine to Sit Supine to sit: Mod assist,HOB elevated Sit to supine: Max assist,HOB elevated General bed mobility comments: use of bed rail Transfers Overall transfer level: Needs assistance Equipment used: 1 person hand held  assist,Rolling walker (2 wheeled) Transfers: Sit to/from [employer] Sit to Stand: Min assist Stand pivot transfers: Mod assist General transfer comment: initial sit to stand with minA and PT hand hold, SPT with PT hand hold. 3 transfers from recliner with RW and minA Ambulation/Gait Ambulation/Gait assistance: Mod assist Gait Distance (Feet): 10 Feet Assistive device: Rolling walker (2 wheeled) Gait Pattern/deviations: Step-to pattern,Decreased step length - left,Decreased dorsiflexion - left,Decreased weight shift to right,Decreased weight shift to left General Gait Details: pt with short step-to gait, requires verbal cues and tactile cueing to facilitate weight shift and to aide in L foot clearance Gait velocity: reduced Gait velocity interpretation: <1.31 ft/sec, indicative of household ambulator    ADL: ADL Overall ADL's : Needs assistance/impaired Grooming: Minimal assistance,Sitting Upper Body Dressing : Moderate assistance,Sitting Lower Body Dressing: Maximal assistance,Cueing for safety Toilet Transfer: Moderate assistance,Stand-pivot,Cueing for safety General ADL Comments: difficulty with assessing what appears as an accurate level of performance for completion of ADL's d/t inability to sustain level of arousal. requiring anywhere from min-max A for safe completion of seated and standing ADL's. will continue to assess level of performance with further participation with therapy session. anticiapte with improved arousal pt will demo increased performance.  Cognition: Cognition Overall Cognitive Status: Impaired/Different from baseline Arousal/Alertness: Awake/alert Orientation Level: Oriented to person,Oriented to place,Oriented to time Attention: Focused,Alternating Focused Attention: Impaired Focused Attention Impairment: Verbal complex,Functional complex Alternating Attention: Impaired Alternating Attention Impairment: Verbal complex,Functional  complex Memory: Impaired Memory Impairment: Decreased recall of new information,Decreased short term memory Decreased Short Term Memory: Verbal complex,Functional complex Problem Solving: Impaired Problem Solving Impairment: Verbal complex,Functional complex Executive Function: Self Monitoring,Organizing Organizing: Impaired Organizing Impairment: Verbal complex,Functional complex Self Monitoring: Impaired Self Monitoring Impairment: Verbal complex,Functional complex Safety/Judgment: Impaired Cognition Arousal/Alertness: Awake/alert Behavior During Therapy: WFL for tasks assessed/performed Overall Cognitive Status: Impaired/Different from baseline Area of Impairment: Safety/judgement,Awareness,Problem solving Orientation Level: Disoriented to,Time Current Attention Level: Sustained Memory: Decreased recall of precautions,Decreased short-term memory Following Commands: Follows one step commands consistently Safety/Judgement: Decreased awareness of safety Awareness: Emergent Problem Solving: Slow processing  Physical Exam: Blood pressure 116/85, pulse 90, temperature 98.7 F (37.1 C), temperature source Oral, resp. rate 18, height 5' (1.524 m), weight 58.4 kg, SpO2 99 %. Physical Exam Vitals reviewed.  Constitutional:      General: She is not in acute distress.    Appearance: She is ill-appearing.  HENT:     Head: Normocephalic and atraumatic.     Right Ear: External ear normal.     Left Ear: External ear normal.     Nose: Nose normal.  Eyes:     General:        Right eye: No discharge.        Left eye: No discharge.     Extraocular Movements: Extraocular movements intact.  Cardiovascular:     Rate and Rhythm: Normal rate and regular rhythm.  Pulmonary:     Effort: Pulmonary effort is normal. No respiratory distress.  Breath sounds: No stridor.  Abdominal:     General: Abdomen is flat. Bowel sounds are normal. There is no distension.  Musculoskeletal:     Cervical  back: Normal range of motion and neck supple.     Comments: Tenderness in hands and feet Dactylitis in all extremities  Skin:    Comments: Erythema in digits  Neurological:     Mental Status: She is alert.     Comments: Alert  Makes eye contact with examiner.   Provides her name and age.   Follows simple commands. Motor: RUE: 3 -/5 proximal distal LUE: 2+/5 proximal distal RLE: 2+/5 proximal distal LLE: 2/5 proximal to distal  Psychiatric:        Mood and Affect: Affect is blunt.        Speech: Speech is delayed.        Behavior: Behavior is slowed.     Results for orders placed or performed during the hospital encounter of 04/22/20 (from the past 48 hour(s))  Glucose, capillary     Status: Abnormal   Collection Time: 04/24/20  8:43 AM  Result Value Ref Range   Glucose-Capillary 106 (H) 70 - 99 mg/dL    Comment: Glucose reference range applies only to samples taken after fasting for at least 8 hours.  Glucose, capillary     Status: Abnormal   Collection Time: 04/24/20 12:19 PM  Result Value Ref Range   Glucose-Capillary 113 (H) 70 - 99 mg/dL    Comment: Glucose reference range applies only to samples taken after fasting for at least 8 hours.  Glucose, capillary     Status: Abnormal   Collection Time: 04/24/20  4:46 PM  Result Value Ref Range   Glucose-Capillary 123 (H) 70 - 99 mg/dL    Comment: Glucose reference range applies only to samples taken after fasting for at least 8 hours.  Glucose, capillary     Status: Abnormal   Collection Time: 04/24/20  9:15 PM  Result Value Ref Range   Glucose-Capillary 144 (H) 70 - 99 mg/dL    Comment: Glucose reference range applies only to samples taken after fasting for at least 8 hours.   Comment 1 Notify RN    Comment 2 Document in Chart   T4, free     Status: Abnormal   Collection Time: 04/24/20 10:11 PM  Result Value Ref Range   Free T4 1.88 (H) 0.61 - 1.12 ng/dL    Comment: (NOTE) Biotin ingestion may interfere with free T4  tests. If the results are inconsistent with the TSH level, previous test results, or the clinical presentation, then consider biotin interference. If needed, order repeat testing after stopping biotin. Performed at [facility], [address], [place], [place] [address]   Comprehensive metabolic panel     Status: Abnormal   Collection Time: 04/25/20  3:57 AM  Result Value Ref Range   Sodium 137 135 - 145 mmol/L   Potassium 6.0 (H) 3.5 - 5.1 mmol/L    Comment: SPECIMEN HEMOLYZED. HEMOLYSIS MAY AFFECT INTEGRITY OF RESULTS.   Chloride 104 98 - 111 mmol/L   CO2 22 22 - 32 mmol/L   Glucose, Bld 110 (H) 70 - 99 mg/dL    Comment: Glucose reference range applies only to samples taken after fasting for at least 8 hours.   BUN 13 6 - 20 mg/dL   Creatinine, Ser 3.73 0.44 - 1.00 mg/dL   Calcium 8.3 (L) 8.9 - 10.3 mg/dL   Total  Protein 4.9 (L) 6.5 - 8.1 g/dL   Albumin 2.5 (L) 3.5 - 5.0 g/dL   AST 244 (H) 15 - 41 U/L   ALT 606 (H) 0 - 44 U/L   Alkaline Phosphatase 386 (H) 38 - 126 U/L   Total Bilirubin 1.4 (H) 0.3 - 1.2 mg/dL   GFR, Estimated >01 >02 mL/min    Comment: (NOTE) Calculated using the CKD-EPI Creatinine Equation (2021)    Anion gap 11 5 - 15    Comment: Performed at [facility], [address], [place], [place] [address]  Glucose, capillary     Status: Abnormal   Collection Time: 04/25/20  6:07 AM  Result Value Ref Range   Glucose-Capillary 107 (H) 70 - 99 mg/dL    Comment: Glucose reference range applies only to samples taken after fasting for at least 8 hours.   Comment 1 Notify RN    Comment 2 Document in Chart   Glucose, capillary     Status: Abnormal   Collection Time: 04/25/20 11:57 AM  Result Value Ref Range   Glucose-Capillary 100 (H) 70 - 99 mg/dL    Comment: Glucose reference range applies only to samples taken after fasting for at least 8 hours.  Glucose, capillary     Status: Abnormal   Collection Time: 04/25/20  4:53 PM  Result Value Ref Range    Glucose-Capillary 116 (H) 70 - 99 mg/dL    Comment: Glucose reference range applies only to samples taken after fasting for at least 8 hours.  Basic metabolic panel     Status: Abnormal   Collection Time: 04/25/20  5:10 PM  Result Value Ref Range   Sodium 135 135 - 145 mmol/L   Potassium >7.5 (HH) 3.5 - 5.1 mmol/L    Comment: HEMOLYSIS AT THIS LEVEL MAY AFFECT RESULT CRITICAL RESULT CALLED TO, READ BACK BY AND VERIFIED WITH: T.BARNES,RN @1811  04/25/2020 VANG.J    Chloride 102 98 - 111 mmol/L   CO2 14 (L) 22 - 32 mmol/L   Glucose, Bld 130 (H) 70 - 99 mg/dL    Comment: Glucose reference range applies only to samples taken after fasting for at least 8 hours.   BUN 14 6 - 20 mg/dL   Creatinine, Ser 04/27/2020 0.44 - 1.00 mg/dL   Calcium 8.6 (L) 8.9 - 10.3 mg/dL   GFR, Estimated 6.44 >03 mL/min    Comment: (NOTE) Calculated using the CKD-EPI Creatinine Equation (2021)    Anion gap 19 (H) 5 - 15    Comment: Performed at [facility], [address] [place], [place]  Potassium     Status: None   Collection Time: 04/25/20  8:36 PM  Result Value Ref Range   Potassium 4.8 3.5 - 5.1 mmol/L    Comment: SLIGHT HEMOLYSIS Performed at [facility], [address], [place], [place]   Glucose, capillary     Status: Abnormal   Collection Time: 04/25/20  9:16 PM  Result Value Ref Range   Glucose-Capillary 143 (H) 70 - 99 mg/dL    Comment: Glucose reference range applies only to samples taken after fasting for at least 8 hours.   Comment 1 Notify RN    Comment 2 Document in Chart    ECHO TEE  Result Date: 04/24/2020    TRANSESOPHOGEAL ECHO REPORT   Patient Name:   BLUE RUGGERIO Date of Exam: 04/24/2020 Medical Rec #:  04/26/2020  Height:       60.0 in Accession #:    [id number]                    Weight:       128.7 lb Date of Birth:  September 23, 1960                    BSA:          1.548 m Patient Age:    59 years                      BP:            135/98 mmHg Patient Gender: F                             HR:           90 bpm. Exam Location:  Inpatient Procedure: Transesophageal Echo, 3D Echo, Color Doppler and Cardiac Doppler Indications:     Stroke i163.9  History:         Patient has prior history of Echocardiogram examinations, most                  recent 04/23/2020. Risk Factors:Hypertension and Diabetes.  Sonographer:     Irving Burton Senior RDCS Referring Phys:  [id number] Kathlynn Grate PEMBERTON Diagnosing Phys: Laurance Flatten MD PROCEDURE: After discussion of the risks and benefits of a TEE, an informed consent was obtained from the patient. The transesophogeal probe was passed without difficulty through the esophogus of the patient. Local oropharyngeal anesthetic was provided with Cetacaine. Sedation performed by different physician. The patient was monitored while under deep sedation. Anesthestetic sedation was provided intravenously by Anesthesiology: 66mg  of Propofol. The patient developed no complications during the procedure. IMPRESSIONS  1. Left ventricular ejection fraction, by estimation, is 20 to 25%. The left ventricle has severely decreased function. The left ventricle demonstrates global hypokinesis.  2. Right ventricular systolic function is moderately reduced. The right ventricular size is normal.  3. Left atrial size was moderately dilated. No left atrial/left atrial appendage thrombus was detected. The LAA emptying velocity was 30 cm/s.  4. Right atrial size was mildly dilated.  5. The mitral valve is degenerative. Mild to moderate mitral valve regurgitation.  6. The aortic valve is tricuspid. There is mild calcification of the aortic valve. There is mild thickening of the aortic valve. Aortic valve regurgitation is trivial.  7. 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  8. A small PFO was detected by color flow Doppler with predominant L-->R shunting, however, there  is flow reversal (R-->L) with valsalva.  9. Agitated saline contrast bubble study was positive with shunting observed within 3-6 cardiac cycles with valsalva suggestive of interatrial shunt. FINDINGS  Left Ventricle: Left ventricular ejection fraction, by estimation, is 20 to 25%. The left ventricle has severely decreased function. The left ventricle demonstrates global hypokinesis. The left ventricular internal cavity size was normal in size. Right Ventricle: The right ventricular size is normal. No increase in right ventricular wall thickness. Right ventricular systolic function is moderately reduced. Left Atrium: Left atrial size was moderately dilated. No left atrial/left atrial appendage thrombus was detected. The LAA emptying velocity was 30 cm/s. Right Atrium: Right atrial size was mildly dilated. Pericardium: There is no evidence of pericardial effusion. Mitral Valve: The mitral valve is  degenerative in appearance. There is mild thickening of the mitral valve leaflet(s). There is mild calcification of the mitral valve leaflet(s). Mild to moderate mitral valve regurgitation. Tricuspid Valve: The tricuspid valve is normal in structure. Tricuspid valve regurgitation is mild. Aortic Valve: The aortic valve is tricuspid. There is mild calcification of the aortic valve. There is mild thickening of the aortic valve. Aortic valve regurgitation is trivial. 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 Pulmonic Valve: The pulmonic valve was normal in structure. Pulmonic valve regurgitation is trivial. Aorta: The aortic root and ascending aorta are structurally normal, with no evidence of dilitation. IAS/Shunts: Evidence of atrial level shunting detected by color flow Doppler. Agitated saline contrast was given intravenously to evaluate for intracardiac shunting. Agitated saline contrast bubble study was positive with shunting observed within 3-6 cardiac  cycles suggestive of interatrial shunt. A small patent foramen ovale is detected. Laurance Flatten MD Electronically signed by Laurance Flatten MD Signature Date/Time: 04/24/2020/9:38:38 AM    Final    US Abdomen Limited RUQ (LIVER/GB)  Result Date: 04/24/2020 CLINICAL DATA:  Elevated liver enzymes EXAM: ULTRASOUND ABDOMEN LIMITED RIGHT UPPER QUADRANT COMPARISON:  None. FINDINGS: Gallbladder: Surgically absent. Common bile duct: Diameter: 5 mm. No intrahepatic or extrahepatic biliary duct dilatation. Liver: No focal lesion identified. Within normal limits in parenchymal echogenicity. Portal vein is patent on color Doppler imaging with normal direction of blood flow towards the liver. Other: None. IMPRESSION: Gallbladder absent.  Study otherwise unremarkable. Electronically Signed   By: Bretta Bang III M.D.   On: 04/24/2020 15:05       Medical Problem List and Plan: 1.  Left hemiparesis secondary to right MCA/PCA, thalamus, right PLIC and CR scattered small punctate infarcts  -patient may shower as tolerated  -ELOS/Goals: 14-18 days/supervision/min a  Admit to CIR 2.  Antithrombotics: -DVT/anticoagulation: Eliquis 5 mg twice daily.  Lower extremity Dopplers negative  -antiplatelet therapy: Aspirin 81 mg daily 3. Pain Management: Hydrocodone as needed  Monitor with increased exertion 4. Mood: Cymbalta 60 mg daily, Wellbutrin 150 mg daily, Ativan 0.5 mg every 8 hours as needed  -antipsychotic agents: N/A 5. Neuropsych: This patient is capable of making decisions on her own behalf. 6. Skin/Wound Care: Routine skin checks 7. Fluids/Electrolytes/Nutrition: Routine in and outs.  CMP ordered 8.  History of PAF/CAD/PFO.  30-day cardiac event monitor per cardiology services Dr.Kardie Tobb.  Continue Eliquis for now.  If EF improves and no atrial fibrillation in 3 months plan to stop Eliquis and restart Plavix.  Monitor with increased activity 9.  Transaminitis.  Follow-up gastroenterology  service Dr. Marina Goodell.    Autoimmune markers pending  CMP ordered 10.  Hyperlipidemia.  Crestor on hold due to elevated LFTs. 11.  Rheumatoid arthritis: Low-dose prednisone as well as weekly Orencia  May need to increase prednisone 12.  Diabetes mellitus.  Hemoglobin A1c 6.1.  SSI.  Patient on Metformin 500 mg daily prior to admission.  Resume as needed  Monitor increase mobility 13.  Hypothyroidism: Synthroid 14.  Essential hypertension.. Blood pressure had remained a bit soft Toprol-XL resumed at 12.5 mg daily 04/26/2020  Monitor with increased mobility  Charlton Amor, PA-C 04/26/2020  I have personally performed a face to face diagnostic evaluation, including, but not limited to relevant history and physical exam findings, of this patient and developed relevant assessment and plan.  Additionally, I have reviewed and concur with the physician assistant's documentation above.  Maryla Morrow, MD,  ABPMR

## 2020-04-26 NOTE — Progress Notes (Signed)
Jamse Arn, MD  Physician  Physical Medicine and Rehabilitation  PMR Pre-admission      Addendum  Date of Service:  04/25/2020  3:31 PM      Related encounter: ED to Hosp-Admission (Current) from 04/22/2020 in [facility]           Show:Clear all [x] Manual[x] Template[x] Copied  Added by: [x] Raechel Ache, OT[x] Jamse Arn, MD   [] Hover for details  PMR Admission Coordinator Pre-Admission Assessment   Patient: Madison Hurst is an 60 y.o., female MRN: [id number] DOB: 05-May-1960 Height: 5' (152.4 cm) Weight: 58.4 kg                                                                                                                                                  Insurance Information HMO:     PPO: yes     PCP:      IPA:      80/20:      OTHER:  PRIMARY: [employer]       Policy#: [id number]      Subscriber: patient CM Name: Lattie Haw      Phone#: [phone]     Fax#:  Pre-Cert#: [id number]      Employer:  Josem Kaufmann provided by Lattie Haw for admit to CIR on 04/26/20. Updates are due to [employer] weekly (p): [phone] [phone] (f): [phone] Benefits:  Phone #: online     Name: [email or web address] Eff. Date: 03/30/2020-03/29/2021     Deduct: does not have for in-network providers      Out of Pocket Max: $4,000 ($0 met)      Life Max:   CIR: $160/day co-pay for days 1-10, $0/day co-pay for days 11-90      SNF: 100% coverage, 0% co-insurance; limited by medical necessity Outpatient: $20/visit co-pay pending service; visits limited by medical necessity      Home Health: 100% coverage, 0% co-insurance; limited by medical necessity     DME: 80% coverage, 20% co-insurance      Providers:  SECONDARY: None      Policy#:       Phone#:    Development worker, community:       Phone#:    [employer] Engineer, [employer]" for patients in Inpatient Rehabilitation Facilities with attached "Privacy Act [place] Records" was provided and  verbally reviewed with: Patient and Family   Emergency Contact Information Contact Information       Name Relation Home Work Mobile    Piatt,Scottie Spouse [phone]           Current Medical History  Patient Admitting Diagnosis: R PCA/MCA stroke-with L hemiparesis    History of Present Illness: Nakyia Dau is a 60 y.o. right-handed female with history of paroxysmal atrial tachycardia, CAD, hypertension, diabetes mellitus, hyperlipidemia, rheumatoid arthritis maintained on chronic prednisone, anxiety  and depression.  Per chart review patient lives with spouse.  1 level home 2 steps to entry.  Patient reports ambulating independently but with poor balance.  Presented 04/22/2020 with progressive left-sided weakness over 1 week resulting in 3 falls.  Admission chemistries glucose 173, AST 65, ALT 62, alkaline phosphatase 162, alcohol negative, hemoglobin A1c 6.1.  Cranial CT scan negative.  Patient did not receive TPA.  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  Single separate punctate acute infarct in the cortex of the medial right parieto-occipital junction.  Few small punctate acute infarcts in the right occipital cortex.  No evidence of hemorrhage or mass-effect.  CT angiogram of head and neck mild stenosis of the left PCA P3 segment.  No emergent large vessel occlusion.  MRI cervical spine showed apparent moderate severe spinal stenosis C4-5 and 5-6 and moderate at C3-4.  Multilevel neuroforaminal narrowing greatest at C3-4 and on the right C4-C5.Marland Kitchen  Echocardiogram with ejection fraction 20 to 25%.  The left ventricle demonstrated global hypokinesis.  TEE showed mild to moderate MR.  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  No LAA thrombus.  Currently maintained on aspirin 81 mg daily as well as Eliquis.  LFTs have continued to elevate.  Acute hepatitis panel negative.  Ultrasound the abdomen showed no  focal lesion identified.  Gallbladder absent.  Study otherwise unremarkable.  Therapy evaluations completed physical medicine rehab consult requested due to patient's left side weakness. Pt is to admit to CIR on 04/26/20.      Complete NIHSS TOTAL: 2 Glasgow Coma Scale Score: (!) 19   Past Medical History      Past Medical History:  Diagnosis Date  . Anemia 02/09/2020  . Anxiety 10/28/2019  . Depressed left ventricular ejection fraction 03/13/2020  . Depressive disorder 10/28/2019  . DOE (dyspnea on exertion) 03/13/2020  . Hypothyroidism 02/09/2020  . Iron deficiency anemia 01/24/2020  . Metabolic syndrome 19/37/9024  . Multinodular goiter 03/07/2015  . PAC (premature atrial contraction) 03/13/2020  . PAT (paroxysmal atrial tachycardia) ([place]) 03/13/2020  . Pericardial effusion 02/09/2020  . 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 Seropositive rheumatoid arthritis ([place]) 02/09/2020  . 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 Type 2 diabetes mellitus ([place]) 05/08/2019  . Vitamin D deficiency 02/09/2020      Family History  family history includes Diabetes in her brother and father; Heart disease in her brother; Muscular dystrophy in her mother.   Prior Rehab/Hospitalizations:  Has the patient had prior rehab or hospitalizations prior to admission? No   Has the patient had major surgery during 100 days prior to admission? Yes   Current Medications    Current Facility-Administered Medications:  .  apixaban (ELIQUIS) tablet 5 mg, 5 mg, Oral, BID, Rosalin Hawking, MD, 5 mg at 04/26/20 0955 .  aspirin EC tablet 81 mg, 81 mg, Oral, Daily, Rosalin Hawking, MD, 81 mg at 04/26/20 0955 .  buPROPion (WELLBUTRIN XL) 24 hr tablet 150 mg, 150 mg, Oral, Daily, Tamala Julian, Rondell A, MD, 150 mg at 04/26/20 0954 .  DULoxetine (CYMBALTA) DR capsule 60 mg, 60 mg, Oral, Daily, Tamala Julian, Rondell A, MD, 60 mg at 04/26/20 0954 .  folic acid (FOLVITE) tablet 2 mg, 2 mg, Oral, Daily, Tamala Julian, Rondell A, MD, 2 mg at 04/26/20  0954 .  HYDROcodone-acetaminophen (NORCO/VICODIN) 5-325 MG per tablet 1 tablet, 1 tablet, Oral, Q6H PRN, Fuller Plan A, MD, 1 tablet at 04/26/20 1458 .  insulin aspart (novoLOG) injection  0-5 Units, 0-5 Units, Subcutaneous, QHS, Smith, Rondell A, MD .  insulin aspart (novoLOG) injection 0-9 Units, 0-9 Units, Subcutaneous, TID WC, Fuller Plan A, MD, 1 Units at 04/24/20 1839 .  levothyroxine (SYNTHROID) tablet 75 mcg, 75 mcg, Oral, QAC breakfast, Fuller Plan A, MD, 75 mcg at 04/26/20 0517 .  LORazepam (ATIVAN) tablet 0.5 mg, 0.5 mg, Oral, Q8H PRN, [place] Donnamarie Poag, DO, 0.5 mg at 04/23/20 2037 .  metoprolol succinate (TOPROL-XL) 24 hr tablet 12.5 mg, 12.5 mg, Oral, Daily, Sarajane Jews, Callie E, PA-C, 12.5 mg at 04/26/20 1457 .  predniSONE (DELTASONE) tablet 5 mg, 5 mg, Oral, Daily, Tamala Julian, Rondell A, MD, 5 mg at 04/26/20 3151   Patients Current Diet:  Diet Order                  Diet - low sodium heart healthy             Diet heart healthy/carb modified Room service appropriate? Yes; Fluid consistency: Thin  Diet effective now                         Precautions / Restrictions Precautions Precautions: Fall Precaution Comments: EF 20-25% Restrictions Weight Bearing Restrictions: No    Has the patient had 2 or more falls or a fall with injury in the past year?Yes   Prior Activity Level Community (5-7x/wk): Independent in ambulation with some assist for dressing (buttons/zippers/clips due to RA). no AD use PTA. does not work or drive   Prior Functional Level Prior Function Level of Independence: Needs assistance Gait / Transfers Assistance Needed: pt reports being indep for ADLs' and mobility; but noted that reporting to PT services that she require A for picking items up off the floor ADL's / Homemaking Assistance Needed: intermittent min A   Self Care: Did the patient need help bathing, dressing, using the toilet or eating?  Needed some help   Indoor Mobility: Did the  patient need assistance with walking from room to room (with or without device)? Independent   Stairs: Did the patient need assistance with internal or external stairs (with or without device)? Needed some help   Functional Cognition: Did the patient need help planning regular tasks such as shopping or remembering to take medications? Independent   Home Assistive Devices / Equipment Home Equipment: Cane - single point,Wheelchair - manual,Shower seat,Bedside commode   Prior Device Use: Indicate devices/aids used by the patient prior to current illness, exacerbation or injury? None of the above   Current Functional Level Cognition   Arousal/Alertness: Awake/alert Overall Cognitive Status: Impaired/Different from baseline Current Attention Level: Sustained Orientation Level: Oriented to person,Oriented to place,Oriented to time Following Commands: Follows one step commands consistently Safety/Judgement: Decreased awareness of safety Attention: Focused,Alternating Focused Attention: Impaired Focused Attention Impairment: Verbal complex,Functional complex Alternating Attention: Impaired Alternating Attention Impairment: Verbal complex,Functional complex Memory: Impaired Memory Impairment: Decreased recall of new information,Decreased short term memory Decreased Short Term Memory: Verbal complex,Functional complex Problem Solving: Impaired Problem Solving Impairment: Verbal complex,Functional complex Executive Function: Self Monitoring,Organizing Organizing: Impaired Organizing Impairment: Verbal complex,Functional complex Self Monitoring: Impaired Self Monitoring Impairment: Verbal complex,Functional complex Safety/Judgment: Impaired    Extremity Assessment (includes Sensation/Coordination)   Upper Extremity Assessment: RUE deficits/detail,LUE deficits/detail RUE Deficits / Details: pt with only mild strength deficits present to R UE husband confirming/reporting that basel;ine with  arthritis limiting strength to R side baseline but appears [facility] for participation and completion of basic ALD's. LUE Deficits /  Details: mild deficits fluctuating from +3 to -4/5 limited by arousal and alertness with participation.  Lower Extremity Assessment: Defer to PT evaluation RLE Deficits / Details: grossly 4/5 LLE Deficits / Details: 3-/5 knee extension, 3-/5 DF, 2/5 PF     ADLs   Overall ADL's : Needs assistance/impaired Grooming: Minimal assistance,Sitting Upper Body Dressing : Moderate assistance,Sitting Lower Body Dressing: Maximal assistance,Cueing for safety Toilet Transfer: Moderate assistance,Stand-pivot,Cueing for safety General ADL Comments: difficulty with assessing what appears as an accurate level of performance for completion of ADL's d/t inability to sustain level of arousal. requiring anywhere from min-max A for safe completion of seated and standing ADL's. will continue to assess level of performance with further participation with therapy session. anticiapte with improved arousal pt will demo increased performance.     Mobility   Overal bed mobility: Needs Assistance Bed Mobility: Supine to Sit Supine to sit: Mod assist,HOB elevated Sit to supine: Max assist,HOB elevated General bed mobility comments: use of bed rail     Transfers   Overall transfer level: Needs assistance Equipment used: 1 person hand held assist,Rolling walker (2 wheeled) Transfers: Sit to/from [employer] Sit to Stand: Min assist Stand pivot transfers: Mod assist General transfer comment: initial sit to stand with minA and PT hand hold, SPT with PT hand hold. 3 transfers from recliner with RW and minA     Ambulation / Gait / Stairs / Wheelchair Mobility   Ambulation/Gait Ambulation/Gait assistance: Mod assist Gait Distance (Feet): 10 Feet Assistive device: Rolling walker (2 wheeled) Gait Pattern/deviations: Step-to pattern,Decreased step length - left,Decreased  dorsiflexion - left,Decreased weight shift to right,Decreased weight shift to left General Gait Details: pt with short step-to gait, requires verbal cues and tactile cueing to facilitate weight shift and to aide in L foot clearance Gait velocity: reduced Gait velocity interpretation: <1.31 ft/sec, indicative of household ambulator     Posture / Balance Dynamic Sitting Balance Sitting balance - Comments: mod-maxA for dynamic sitting balance without UE support Balance Overall balance assessment: Needs assistance Sitting-balance support: Single extremity supported,Feet supported Sitting balance-Leahy Scale: Poor Sitting balance - Comments: mod-maxA for dynamic sitting balance without UE support Postural control: Posterior lean Standing balance support: Single extremity supported,Bilateral upper extremity supported Standing balance-Leahy Scale: Poor Standing balance comment: minA with BUE support and use of RW     Special needs/care consideration Skin: ecchymosis to back, hip, thigh (bilateral) and Diabetic management: yes        Previous Home Environment (from acute therapy documentation) Living Arrangements: Spouse/significant other  Lives With: Spouse,Son Available Help at Discharge: Family,Available PRN/intermittently Type of Home: House Home Layout: One level Home Access: Stairs to enter Entrance Stairs-Rails: Can reach both Entrance Stairs-Number of Steps: 2 Bathroom Shower/Tub: Chiropodist: Standard Additional Comments: husband supportive at bedside   Discharge Living Setting Plans for Discharge Living Setting: Lives with (comment),House (husband and son (>34 yo)) Type of Home at Discharge: House Discharge Home Layout: One level Discharge Home Access: Stairs to enter Entrance Stairs-Rails: None Entrance Stairs-Number of Steps: 1 Discharge Bathroom Shower/Tub: Walk-in shower Discharge Bathroom Toilet: Standard Discharge Bathroom Accessibility: Yes How  Accessible: Accessible via walker Does the patient have any problems obtaining your medications?: No   Social/Family/Support Systems Patient Roles: Spouse Contact Information: husband: Victorino Sparrow [phone] Anticipated Caregiver: husband + son (son is over 21 yo) Anticipated Caregiver's Contact Information: see above Ability/Limitations of Caregiver: Supervision/Min A  Caregiver Availability: Intermittent (close to 24/7 per his report) Discharge Plan Discussed  with Primary Caregiver: Yes Is Caregiver In Agreement with Plan?: Yes Does Caregiver/Family have Issues with Lodging/Transportation while Pt is in Rehab?: No     Goals Patient/Family Goal for Rehab: PT/OT: supervision/Min A; SLP: Mod I Expected length of stay: 15-19 days Pt/Family Agrees to Admission and willing to participate: Yes Program Orientation Provided & Reviewed with Pt/Caregiver Including Roles  & Responsibilities: Yes  Barriers to Discharge: Home environment access/layout,Lack of/limited family support  Barriers to Discharge Comments: one step to enter; husband can be at home close to 24/7.     Decrease burden of Care through IP rehab admission: Other NA     Possible need for SNF placement upon discharge:Not anticipated; anticipate pt can reach Supervision/Min A level which is a level the patient's husband can perform as needed.      Patient Condition: This patient's condition remains as documented in the consult dated 04/25/20, in which the Rehabilitation Physician determined and documented that the patient's condition is appropriate for intensive rehabilitative care in an inpatient rehabilitation facility. Will admit to inpatient rehab today.    Preadmission Screen Completed By:  Raechel Ache, OT, 04/26/2020 3:03 PM ______________________________________________________________________   Discussed status with Dr. Posey Pronto on 04/26/20 at 3:03PM and received approval for admission today.   Admission Coordinator:  Raechel Ache, time 3:03PM/Date 04/26/20           Revision History                             Note Details  Author Posey Pronto, Domenick Bookbinder, MD File Time 04/26/2020  3:09 PM  Author Type Physician Status Addendum  Last Editor Jamse Arn, MD Service Physical Medicine and Rehabilitation

## 2020-04-26 NOTE — Progress Notes (Signed)
Madison Rouge, MD  Physician  Physical Medicine and Rehabilitation  Consult Note      Signed  Date of Service:  04/25/2020  5:36 AM      Related encounter: ED to Hosp-Admission (Current) from 04/22/2020 in [place] [facility] [facility]       Signed      Expand All Collapse All     Show:Clear all [x] Manual[x] Template[] Copied  Added by: [x] Angiulli, , PA-C[x] , MD   [] Hover for details           Physical Medicine and Rehabilitation Consult Reason for Consult: Left side weakness Referring Physician: [facility]     HPI: Madison Hurst is a 60 y.o. right-handed female with history of paroxysmal atrial tachycardia, CAD, hypertension, diabetes mellitus, hyperlipidemia, rheumatoid arthritis maintained on chronic prednisone, anxiety and depression.  Per chart review patient lives with spouse.  1 level home 2 steps to entry.  Patient reports ambulating independently but with poor balance.  Presented 04/22/2020 with progressive left-sided weakness over 1 week resulting in 3 falls.  Admission chemistries glucose 173, AST 65, ALT 62, alkaline phosphatase 162, alcohol negative, hemoglobin A1c 6.1.  Cranial CT scan negative.  Patient did not receive TPA.  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  Single separate punctate acute infarct in the cortex of the medial right parieto-occipital junction.  Few small punctate acute infarcts in the right occipital cortex.  No evidence of hemorrhage or mass-effect.  CT angiogram of head and neck mild stenosis of the left PCA P3 segment.  No emergent large vessel occlusion.  MRI cervical spine showed apparent moderate severe spinal stenosis C4-5 and 5-6 and moderate at C3-4.  Multilevel neuroforaminal narrowing greatest at C3-4 and on the right C4-C5.Madison Hurst  Echocardiogram with ejection fraction 20 to 25%.  The left ventricle demonstrated global hypokinesis.  TEE showed mild to  moderate MR.  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  No LAA thrombus.  Currently maintained on aspirin 81 mg daily as well as Eliquis.  LFTs have continued to elevate.  Acute hepatitis panel negative.  Ultrasound the abdomen showed no focal lesion identified.  Gallbladder absent.  Study otherwise unremarkable.  Therapy evaluations completed physical medicine rehab consult requested due to patient's left side weakness.   Pt notes never has had abnormal LFTs that she's aware of- being seen by GI.  Pt reports her RA pain is "stable" currently.  LBM (exxcept maybe a tiny one today), was prior to admission; using Purewick for peeing- no dysuria.    Review of Systems  Constitutional: Negative for chills and fever.  HENT: Negative for hearing loss.   Eyes: Negative for blurred vision and double vision.  Respiratory: Positive for shortness of breath. Negative for cough.   Cardiovascular: Positive for palpitations and leg swelling.  Gastrointestinal: Positive for constipation. Negative for heartburn, nausea and vomiting.  Genitourinary: Negative for dysuria, flank pain and hematuria.  Musculoskeletal: Positive for joint pain and myalgias.  Skin: Negative for rash.  Neurological: Positive for weakness.  Psychiatric/Behavioral: The patient has insomnia.        Anxiety  All other systems reviewed and are negative.       Past Medical History:  Diagnosis Date  . Anemia 02/09/2020  . Anxiety 10/28/2019  . Depressed left ventricular ejection fraction 03/13/2020  . Depressive disorder 10/28/2019  . DOE (dyspnea on exertion) 03/13/2020  . Hypothyroidism 02/09/2020  . Iron deficiency anemia 01/24/2020  .  Iron deficiency anemia, unspecified    . Metabolic syndrome 03/13/2020  . Multinodular goiter 03/07/2015  . PAC (premature atrial contraction) 03/13/2020  . PAT (paroxysmal atrial tachycardia) (HCC) 03/13/2020  . Pericardial effusion 02/09/2020  . Secondary generalized osteoporosis  02/09/2020  . 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 Type 2 diabetes mellitus (HCC) 05/08/2019  . Vitamin D deficiency 02/09/2020         Past Surgical History:  Procedure Laterality Date  . CESAREAN SECTION      . CHOLECYSTECTOMY      . other        Growth removal on chest  . TOTAL SHOULDER ARTHROPLASTY Left           Family History  Problem Relation Age of Onset  . Muscular dystrophy Mother    . Diabetes Father    . Diabetes Brother    . Heart disease Brother      Social History:  reports that she quit smoking about 26 years ago. She has never used smokeless tobacco. No history on file for alcohol use and drug use. Allergies:      Allergies  Allergen Reactions  . Sulfa Antibiotics    . Sulfamethoxazole-Trimethoprim Rash          Medications Prior to Admission  Medication Sig Dispense Refill  . Abatacept (ORENCIA [place]) Inject into the skin once a week.      Marland Kitchen aspirin EC 81 MG tablet Take 1 tablet (81 mg total) by mouth daily. Swallow whole. 90 tablet 3  . buPROPion (WELLBUTRIN XL) 150 MG 24 hr tablet Take 150 mg by mouth daily.      . DULoxetine (CYMBALTA) 60 MG capsule 60 mg.      . ergocalciferol (VITAMIN D2) 1.25 MG (50000 UT) capsule Take 50,000 Units by mouth once a week.      . flurbiprofen (ANSAID) 100 MG tablet Take 100 mg by mouth 2 (two) times daily.      . folic acid (FOLVITE) 1 MG tablet Take 2 mg by mouth daily.      Marland Kitchen HYDROcodone-acetaminophen (NORCO/VICODIN) 5-325 MG tablet Take 1 tablet by mouth every 6 (six) hours as needed for moderate pain.      Marland Kitchen levothyroxine (SYNTHROID) 75 MCG tablet Take 75 mcg by mouth daily before breakfast.      . metFORMIN (GLUMETZA) 500 MG (MOD) 24 hr tablet Take 500 mg by mouth daily with breakfast.      . metoprolol succinate (TOPROL XL) 25 MG 24 hr tablet Take 0.5 tablets (12.5 mg total) by mouth daily. 30 tablet 3  . Multiple Vitamin (MULTIVITAMIN) capsule Take 1 capsule by mouth  daily.      Marland Kitchen ORENCIA CLICKJECT 125 MG/ML SOAJ Inject 125 mg into the skin once a week.      . predniSONE (DELTASONE) 5 MG tablet Take 5 mg by mouth daily.      . rosuvastatin (CRESTOR) 20 MG tablet Take 1 tablet (20 mg total) by mouth daily. 90 tablet 3  . telmisartan (MICARDIS) 20 MG tablet Take 20 mg by mouth daily.          Home: Home Living Family/patient expects to be discharged to:: Private residence Living Arrangements: Spouse/significant other Available Help at Discharge: Family,Available PRN/intermittently Type of Home: House Home Access: Stairs to enter [employer] of Steps: 2 Entrance Stairs-Rails: Can reach both Home Layout: One level Bathroom Shower/Tub: Engineer, manufacturing systems: Standard Home Equipment: [employer] -  single point,Wheelchair - manual,Shower seat,Bedside commode Additional Comments: husband supportive at bedside  Functional History: Prior Function Level of Independence: Needs assistance Gait / Transfers Assistance Needed: pt reports being indep for ADLs' and mobility; but noted that reporting to PT services that she require A for picking items up off the floor ADL's / Homemaking Assistance Needed: intermittent min A Functional Status:  Mobility: Bed Mobility Overal bed mobility: Needs Assistance Bed Mobility: Sit to Supine Supine to sit: Mod assist,HOB elevated Sit to supine: Max assist,HOB elevated Transfers Overall transfer level: Needs assistance Equipment used: 1 person hand held assist Transfers: Stand Pivot Transfers,Sit to/from Stand Sit to Stand: Mod assist Stand pivot transfers: Max assist General transfer comment: cues for sequencing, increased assist required at this time d/t fatigue Ambulation/Gait Ambulation/Gait assistance:  (deferred 2/2 imbalance)   ADL: ADL Overall ADL's : Needs assistance/impaired Grooming: Minimal assistance,Sitting Upper Body Dressing : Moderate assistance,Sitting Lower Body Dressing: Maximal  assistance,Cueing for safety Toilet Transfer: Moderate assistance,Stand-pivot,Cueing for safety General ADL Comments: difficulty with assessing what appears as an accurate level of performance for completion of ADL's d/t inability to sustain level of arousal. requiring anywhere from min-max A for safe completion of seated and standing ADL's. will continue to assess level of performance with further participation with therapy session. anticiapte with improved arousal pt will demo increased performance.   Cognition: Cognition Overall Cognitive Status: Impaired/Different from baseline Orientation Level: Oriented to person,Oriented to place,Oriented to time Cognition Arousal/Alertness: Lethargic (difficulty with sustained arousal/alertness) Behavior During Therapy: Flat affect Overall Cognitive Status: Impaired/Different from baseline Area of Impairment: Orientation,Attention,Following commands,Safety/judgement,Awareness Orientation Level: Disoriented to,Time Current Attention Level: Sustained Memory: Decreased recall of precautions,Decreased short-term memory Following Commands: Follows one step commands consistently Safety/Judgement: Decreased awareness of safety,Decreased awareness of deficits Awareness: Emergent Problem Solving: Slow processing,Difficulty sequencing,Requires verbal cues,Requires tactile cues   Blood pressure 109/79, pulse 89, temperature 98 F (36.7 C), temperature source Oral, resp. rate 20, height 5' (1.524 m), weight 58.4 kg, SpO2 100 %. Physical Exam Vitals and nursing note reviewed. Exam conducted with a chaperone present.  Constitutional:      Comments: Awake, alert, appropriate, sitting up in bedside chair, husband at bedside (who's had previous stroke, of note), NAD  HENT:     Head: Normocephalic and atraumatic.     Comments: L facial droop- better with smiling; tongue midline    Nose: Nose normal. No congestion.     Mouth/Throat:     Mouth: Mucous membranes  are dry.     Pharynx: Oropharynx is clear. No oropharyngeal exudate.  Eyes:     Extraocular Movements: Extraocular movements intact.     Comments: EOMI B/L and no nystagmus  Cardiovascular:     Heart sounds: Normal heart sounds. No murmur heard.     Comments: RRR- no JVD Pulmonary:     Comments: CTA B/L- no W/R/R- good air movement Abdominal:     Comments: Soft, NT, ND, (+)BS - no TTP and hypoactive  Genitourinary:    Comments: purewick- trace medium amber urine in container Musculoskeletal:     Cervical back: Neck supple. Tenderness present.     Comments: RUE 5-/5- same muscles tested LUE- biceps, triceps 3+/5, grip 2/5, finger abd 2-/5 RLE_ 5-/5 same muscles LLE- HF 3/5, KE 4+/5, DF and PF 4+/5    Skin:    Comments: Has bleeding from around R AC fossa IV and has blood on BP cuff on LUE- had dug slightly into skin Has blue color to fingers, hands  and toes/feet- significant blue color and cool, not cold to touch. Per pt, has been that way "for awhile".   Neurological:     Comments: Patient is a bit lethargic but arousable.  Makes eye contact with examiner.  Provides her name and age.  Follows simple commands. Slowed processing, but answering appropriately Intact to light touch in all 4 extremities Ox3-   Psychiatric:     Comments: Flat, slowed responses        Lab Results Last 24 Hours       Results for orders placed or performed during the hospital encounter of 04/22/20 (from the past 24 hour(s))  Glucose, capillary     Status: Abnormal    Collection Time: 04/24/20  6:02 AM  Result Value Ref Range    Glucose-Capillary 122 (H) 70 - 99 mg/dL    Comment 1 Notify RN      Comment 2 Document in Chart    Glucose, capillary     Status: Abnormal    Collection Time: 04/24/20  8:43 AM  Result Value Ref Range    Glucose-Capillary 106 (H) 70 - 99 mg/dL  Glucose, capillary     Status: Abnormal    Collection Time: 04/24/20 12:19 PM  Result Value Ref Range    Glucose-Capillary  113 (H) 70 - 99 mg/dL  Glucose, capillary     Status: Abnormal    Collection Time: 04/24/20  4:46 PM  Result Value Ref Range    Glucose-Capillary 123 (H) 70 - 99 mg/dL  Glucose, capillary     Status: Abnormal    Collection Time: 04/24/20  9:15 PM  Result Value Ref Range    Glucose-Capillary 144 (H) 70 - 99 mg/dL    Comment 1 Notify RN      Comment 2 Document in Chart    T4, free     Status: Abnormal    Collection Time: 04/24/20 10:11 PM  Result Value Ref Range    Free T4 1.88 (H) 0.61 - 1.12 ng/dL  Comprehensive metabolic panel     Status: Abnormal    Collection Time: 04/25/20  3:57 AM  Result Value Ref Range    Sodium 137 135 - 145 mmol/L    Potassium 6.0 (H) 3.5 - 5.1 mmol/L    Chloride 104 98 - 111 mmol/L    CO2 22 22 - 32 mmol/L    Glucose, Bld 110 (H) 70 - 99 mg/dL    BUN 13 6 - 20 mg/dL    Creatinine, Ser 1.61 0.44 - 1.00 mg/dL    Calcium 8.3 (L) 8.9 - 10.3 mg/dL    Total Protein 4.9 (L) 6.5 - 8.1 g/dL    Albumin 2.5 (L) 3.5 - 5.0 g/dL    AST 096 (H) 15 - 41 U/L    ALT 606 (H) 0 - 44 U/L    Alkaline Phosphatase 386 (H) 38 - 126 U/L    Total Bilirubin 1.4 (H) 0.3 - 1.2 mg/dL    GFR, Estimated >04 >54 mL/min    Anion gap 11 5 - 15       Imaging Results (Last 48 hours)  MR CERVICAL SPINE WO CONTRAST   Result Date: 04/23/2020 CLINICAL DATA:  Myelopathy, acute or progressive. Additional history provided: Patient reports worsening weakness on the left side, most notably within the last week, 3 falls in the last 2 days weakness all over, but the left-sided seems weaker than the right. EXAM: MRI CERVICAL SPINE WITHOUT CONTRAST TECHNIQUE: Multiplanar, multisequence  MR imaging of the cervical spine was performed. No intravenous contrast was administered. COMPARISON:  CT head/neck 04/22/2020.  CT cervical spine 01/26/2015. FINDINGS: All sequences are moderate to severely motion degraded, significantly limiting evaluation. Alignment: Straightening of the expected cervical lordosis.  No significant spondylolisthesis. Vertebrae: No significant marrow edema or focal suspicious osseous Lea is identified. Cord: Motion degradation precludes adequate evaluation for spinal cord signal abnormality. Posterior Fossa, vertebral arteries, paraspinal tissues: Cerebellar atrophy. No acute abnormality identified within included portions of the posterior fossa. Cervical vertebral artery flow voids poorly assessed due to motion degradation. No paraspinal soft tissue abnormality is identified. Disc levels: Multilevel disc degeneration. Most notably, there is moderate disc degeneration at the C4-C5, C5-C6 and C6-C7 levels. C2-C3: Disc bulge. Facet hypertrophy. No significant spinal canal stenosis. Mild bilateral neural foraminal narrowing. C3-C4: Disc bulge. Bilateral disc osteophyte ridge/uncinate hypertrophy (greater on the right). Facet and ligamentum flavum hypertrophy. Apparent moderate spinal canal stenosis. Bilateral neural foraminal narrowing (severe right, mild left). C4-C5: Posterior disc osteophyte complex. Uncovertebral, facet and ligamentum flavum hypertrophy. Apparent moderate/severe spinal canal stenosis. Bilateral neural foraminal narrowing (moderate/severe right, moderate left). C5-C6: Posterior disc osteophyte complex. Uncovertebral, facet and ligamentum flavum hypertrophy. Apparent moderate/severe spinal canal stenosis. Bilateral neural foraminal narrowing (moderate right, moderate/severe left). C6-C7: Posterior disc osteophyte complex. Uncovertebral, facet and ligamentum flavum hypertrophy. Apparent moderate spinal canal stenosis. Bilateral neural foraminal narrowing (moderate/severe right, moderate left). C7-T1: Mild endplate spurring on the left. Mild facet hypertrophy (greater on the left). No appreciable disc herniation or spinal canal stenosis. Mild left neural foraminal narrowing. IMPRESSION: All acquired sequences are moderate to severely motion degraded, significantly limiting  evaluation. A repeat examination should be considered when the patient is better able to tolerate the study. Cervical spondylosis as outlined. Most notably, there is apparent moderate/severe spinal canal stenosis at C4-C5 and C5-C6, and apparent moderate spinal canal stenosis at C3-C4 and C6-C7. Motion degradation limits evaluation for subtle spinal cord mass effect and precludes adequate evaluation for spinal cord signal abnormality. Multilevel neural foraminal narrowing as detailed and greatest on the right at C3-C4 (severe), on the right at C4-C5 (moderate/severe), on the left at C5-C6 (moderate/severe) and on the right at C6-C7 (moderate/severe). Electronically Signed   By: Jackey Loge DO   On: 04/23/2020 12:27    ECHOCARDIOGRAM COMPLETE   Result Date: 04/23/2020    ECHOCARDIOGRAM REPORT   Patient Name:   Bluma CAROL Gonzella Lex Date of Exam: 04/23/2020 Medical Rec #:  [id number]                     Height:       60.0 in Accession #:    [id number]                    Weight:       123.0 lb Date of Birth:  09/22/1960                    BSA:          1.518 m Patient Age:    59 years                      BP:           115/102 mmHg Patient Gender: F                             HR:  107 bpm. Exam Location:  Inpatient Procedure: 2D Echo and Color Doppler Indications:    Stroke 434.91 / I163.9  History:        Patient has prior history of Echocardiogram examinations, most                 recent 03/11/2020. Signs/Symptoms:Dyspnea; Risk                 Factors:Diabetes. Hypothyroidism.  Sonographer:    Tiffany Dance Referring Phys: [id number] RONDELL A SMITH IMPRESSIONS  1. Left ventricular ejection fraction, by estimation, is 20 to 25%. The left ventricle has severely decreased function. The left ventricle demonstrates global hypokinesis. Left ventricular diastolic parameters are indeterminate.  2. Right ventricular systolic function is normal. The right ventricular size is normal.  3. Left atrial size  was moderately dilated.  4. The mitral valve is normal in structure. Mild mitral valve regurgitation. No evidence of mitral stenosis.  5. The aortic valve is tricuspid. Aortic valve regurgitation is not visualized. No aortic stenosis is present.  6. The inferior vena cava is normal in size with greater than 50% respiratory variability, suggesting right atrial pressure of 3 mmHg. FINDINGS  Left Ventricle: Left ventricular ejection fraction, by estimation, is 20 to 25%. The left ventricle has severely decreased function. The left ventricle demonstrates global hypokinesis. The left ventricular internal cavity size was normal in size. There is no left ventricular hypertrophy. Left ventricular diastolic parameters are indeterminate. Right Ventricle: The right ventricular size is normal. Right ventricular systolic function is normal. Left Atrium: Left atrial size was moderately dilated. Right Atrium: Right atrial size was normal in size. Pericardium: There is no evidence of pericardial effusion. Mitral Valve: The mitral valve is normal in structure. Mild mitral annular calcification. Mild mitral valve regurgitation. No evidence of mitral valve stenosis. Tricuspid Valve: The tricuspid valve is normal in structure. Tricuspid valve regurgitation is trivial. No evidence of tricuspid stenosis. Aortic Valve: The aortic valve is tricuspid. Aortic valve regurgitation is not visualized. No aortic stenosis is present. Pulmonic Valve: The pulmonic valve was grossly normal. Pulmonic valve regurgitation is not visualized. No evidence of pulmonic stenosis. Aorta: The aortic root is normal in size and structure. Venous: The inferior vena cava is normal in size with greater than 50% respiratory variability, suggesting right atrial pressure of 3 mmHg. IAS/Shunts: No atrial level shunt detected by color flow Doppler.  LEFT VENTRICLE PLAX 2D LVIDd:         4.70 cm LVIDs:         4.00 cm LV PW:         1.20 cm LV IVS:        0.70 cm LVOT  diam:     1.90 cm LV SV:         19 LV SV Index:   13 LVOT Area:     2.84 cm  RIGHT VENTRICLE            IVC RV Basal diam:  2.20 cm    IVC diam: 2.10 cm RV S prime:     6.42 cm/s TAPSE (M-mode): 1.2 cm LEFT ATRIUM             Index       RIGHT ATRIUM           Index LA diam:        5.10 cm 3.36 cm/m  RA Area:     12.90 cm LA Vol (A2C):   68.6 ml 45.19  ml/m RA Volume:   27.30 ml  17.98 ml/m LA Vol (A4C):   64.6 ml 42.55 ml/m LA Biplane Vol: 67.3 ml 44.33 ml/m  AORTIC VALVE LVOT Vmax:   48.75 cm/s LVOT Vmean:  30.800 cm/s LVOT VTI:    0.068 m  AORTA Ao Root diam: 2.70 cm Ao Asc diam:  3.40 cm MITRAL VALVE MV Area (PHT): 5.13 cm    SHUNTS MV Decel Time: 148 msec    Systemic VTI:  0.07 m MV E velocity: 92.40 cm/s  Systemic Diam: 1.90 cm MV A velocity: 71.10 cm/s MV E/A ratio:  1.30 Olga Millers MD Electronically signed by Olga Millers MD Signature Date/Time: 04/23/2020/2:25:22 PM    Final     ECHO TEE   Result Date: 04/24/2020    TRANSESOPHOGEAL ECHO REPORT   Patient Name:   Madison Hurst Date of Exam: 04/24/2020 Medical Rec #:  [id number]                     Height:       60.0 in Accession #:    [id number]                    Weight:       128.7 lb Date of Birth:  1960/09/14                    BSA:          1.548 m Patient Age:    59 years                      BP:           135/98 mmHg Patient Gender: F                             HR:           90 bpm. Exam Location:  Inpatient Procedure: Transesophageal Echo, 3D Echo, Color Doppler and Cardiac Doppler Indications:     Stroke i163.9  History:         Patient has prior history of Echocardiogram examinations, most                  recent 04/23/2020. Risk Factors:Hypertension and Diabetes.  Sonographer:     Irving Burton Senior RDCS Referring Phys:  [id number] Kathlynn Grate [place] Diagnosing Phys: Laurance Flatten MD PROCEDURE: After discussion of the risks and benefits of a TEE, an informed consent was obtained from the patient. The transesophogeal  probe was passed without difficulty through the esophogus of the patient. Local oropharyngeal anesthetic was provided with Cetacaine. Sedation performed by different physician. The patient was monitored while under deep sedation. Anesthestetic sedation was provided intravenously by Anesthesiology:  of Propofol. The patient developed no complications during the procedure. IMPRESSIONS  1. Left ventricular ejection fraction, by estimation, is 20 to 25%. The left ventricle has severely decreased function. The left ventricle demonstrates global hypokinesis.  2. Right ventricular systolic function is moderately reduced. The right ventricular size is normal.  3. Left atrial size was moderately dilated. No left atrial/left atrial appendage thrombus was detected. The LAA emptying velocity was 30 cm/s.  4. Right atrial size was mildly dilated.  5. The mitral valve is degenerative. Mild to moderate mitral valve regurgitation.  6. The aortic valve is tricuspid. There is mild calcification of the aortic valve. There is mild thickening of  the aortic valve. Aortic valve regurgitation is trivial.  7. 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  8. A small PFO was detected by color flow Doppler with predominant L-->R shunting, however, there is flow reversal (R-->L) with valsalva.  9. Agitated saline contrast bubble study was positive with shunting observed within 3-6 cardiac cycles with valsalva suggestive of interatrial shunt. FINDINGS  Left Ventricle: Left ventricular ejection fraction, by estimation, is 20 to 25%. The left ventricle has severely decreased function. The left ventricle demonstrates global hypokinesis. The left ventricular internal cavity size was normal in size. Right Ventricle: The right ventricular size is normal. No increase in right ventricular wall thickness. Right ventricular systolic function is moderately reduced. Left Atrium: Left  atrial size was moderately dilated. No left atrial/left atrial appendage thrombus was detected. The LAA emptying velocity was 30 cm/s. Right Atrium: Right atrial size was mildly dilated. Pericardium: There is no evidence of pericardial effusion. Mitral Valve: The mitral valve is degenerative in appearance. There is mild thickening of the mitral valve leaflet(s). There is mild calcification of the mitral valve leaflet(s). Mild to moderate mitral valve regurgitation. Tricuspid Valve: The tricuspid valve is normal in structure. Tricuspid valve regurgitation is mild. Aortic Valve: The aortic valve is tricuspid. There is mild calcification of the aortic valve. There is mild thickening of the aortic valve. Aortic valve regurgitation is trivial. 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 Pulmonic Valve: The pulmonic valve was normal in structure. Pulmonic valve regurgitation is trivial. Aorta: The aortic root and ascending aorta are structurally normal, with no evidence of dilitation. IAS/Shunts: Evidence of atrial level shunting detected by color flow Doppler. Agitated saline contrast was given intravenously to evaluate for intracardiac shunting. Agitated saline contrast bubble study was positive with shunting observed within 3-6 cardiac cycles suggestive of interatrial shunt. A small patent foramen ovale is detected. Laurance Flatten MD Electronically signed by Laurance Flatten MD Signature Date/Time: 04/24/2020/9:38:38 AM    Final     VAS [place] LOWER EXTREMITY VENOUS (DVT)   Result Date: 04/23/2020  Lower Venous DVT Study Indications: SOB.  Risk Factors: None identified. Anticoagulation: Lovenox. Limitations: Patient movement. Comparison Study: No previous exams Performing Technologist: Clint Guy RVT  Examination Guidelines: A complete evaluation includes B-mode imaging, spectral Doppler, color Doppler, and power Doppler as needed of all accessible  portions of each vessel. Bilateral testing is considered an integral part of a complete examination. Limited examinations for reoccurring indications may be performed as noted. The reflux portion of the exam is performed with the patient in reverse Trendelenburg.  +---------+---------------+---------+-----------+----------+--------------+ RIGHT    CompressibilityPhasicitySpontaneityPropertiesThrombus Aging +---------+---------------+---------+-----------+----------+--------------+ CFV      Full           Yes      Yes                                 +---------+---------------+---------+-----------+----------+--------------+ SFJ      Full                                                        +---------+---------------+---------+-----------+----------+--------------+ FV Prox  Full                                                        +---------+---------------+---------+-----------+----------+--------------+  FV Mid   Full                                                        +---------+---------------+---------+-----------+----------+--------------+ FV DistalFull                                                        +---------+---------------+---------+-----------+----------+--------------+ PFV      Full                                                        +---------+---------------+---------+-----------+----------+--------------+ POP      Full           Yes      Yes                                 +---------+---------------+---------+-----------+----------+--------------+ PTV      Full                                                        +---------+---------------+---------+-----------+----------+--------------+ PERO     Full                                                        +---------+---------------+---------+-----------+----------+--------------+   +---------+---------------+---------+-----------+----------+--------------+ LEFT      CompressibilityPhasicitySpontaneityPropertiesThrombus Aging +---------+---------------+---------+-----------+----------+--------------+ CFV      Full           Yes      Yes                                 +---------+---------------+---------+-----------+----------+--------------+ SFJ      Full                                                        +---------+---------------+---------+-----------+----------+--------------+ FV Prox  Full                                                        +---------+---------------+---------+-----------+----------+--------------+ FV Mid   Full                                                        +---------+---------------+---------+-----------+----------+--------------+  FV DistalFull                                                        +---------+---------------+---------+-----------+----------+--------------+ PFV      Full                                                        +---------+---------------+---------+-----------+----------+--------------+ POP      Full           Yes      Yes                                 +---------+---------------+---------+-----------+----------+--------------+ PTV      Full                                                        +---------+---------------+---------+-----------+----------+--------------+ PERO     Full                                                        +---------+---------------+---------+-----------+----------+--------------+  Summary: BILATERAL: - No evidence of deep vein thrombosis seen in the lower extremities, bilaterally. -No evidence of popliteal cyst, bilaterally.   *See table(s) above for measurements and observations. Electronically signed by Waverly Ferrari MD on 04/23/2020 at 11:27:04 AM.    Final     US Abdomen Limited RUQ (LIVER/GB)   Result Date: 04/24/2020 CLINICAL DATA:  Elevated liver enzymes EXAM: ULTRASOUND ABDOMEN LIMITED RIGHT  UPPER QUADRANT COMPARISON:  None. FINDINGS: Gallbladder: Surgically absent. Common bile duct: Diameter: 5 mm. No intrahepatic or extrahepatic biliary duct dilatation. Liver: No focal lesion identified. Within normal limits in parenchymal echogenicity. Portal vein is patent on color Doppler imaging with normal direction of blood flow towards the liver. Other: None. IMPRESSION: Gallbladder absent.  Study otherwise unremarkable. Electronically Signed   By: Bretta Bang III M.D.   On: 04/24/2020 15:05         Assessment/Plan: Diagnosis: R PCA/MCA stroke- with L hemiparesis 1. Does the need for close, 24 hr/day medical supervision in concert with the patient's rehab needs make it unreasonable for this patient to be served in a less intensive setting? Yes 2. Co-Morbidities requiring supervision/potential complications: HTN, EF-25-30%- RA, need for Zio patch , HTN with new hypotension, significantly increasing LFTs, DM- A1c 6.1, on opioids for chronic pain due to RA 3. Due to bladder management, bowel management, safety, skin/wound care, disease management, medication administration, pain management and patient education, does the patient require 24 hr/day rehab nursing? Yes 4. Does the patient require coordinated care of a physician, rehab nurse, therapy disciplines of PT OT and SLP to address physical and functional deficits in the context of the above medical diagnosis(es)? Yes Addressing deficits in the following areas: balance, endurance, locomotion, strength,  transferring, bowel/bladder control, bathing, dressing, feeding, grooming, toileting, cognition and swallowing 5. Can the patient actively participate in an intensive therapy program of at least 3 hrs of therapy per day at least 5 days per week? Yes 6. The potential for patient to make measurable gains while on inpatient rehab is good 7. Anticipated functional outcomes upon discharge from inpatient rehab are supervision and min assist  with PT,  supervision and min assist with OT, modified independent with SLP. 8. Estimated rehab length of stay to reach the above functional goals is: ~ 2 weeks- husband sounded like wasn't happy with this, he wasn't clear if wanted more or less.  9. Anticipated discharge destination: Home 10. Overall Rehab/Functional Prognosis: good   RECOMMENDATIONS: This patient's condition is appropriate for continued rehabilitative care in the following setting: CIR Patient has agreed to participate in recommended program. Potentially Note that insurance prior authorization may be required for reimbursement for recommended care.   Comment:  1. Pt has rising LFTs- today 600-900+ of AST/ALT- appears GI has already been called- great! 2. Pt needs BP cuff- a larger size,  3. Discussed with nursing about BP cuff, hypotension and IV 4. Will need to have LFTs at least stable for 24 hours before can admit to CIR_ needs whatever workup required since would interfere in pt doing therapy/3 hrs/day in CIR. Once stabilized a little, happy to bring her.  5. Will submit for inpt rehab- will d/w admissions coordinator.  6. Thank you for this consult.        Mcarthur Rossetti Angiulli, PA-C 04/25/2020      I have personally performed a face to face diagnostic evaluation of this patient and formulated the key components of the plan.  Additionally, I have personally reviewed laboratory data, imaging studies, as well as relevant notes and concur with the physician assistant's documentation above.              Revision History                        Routing History              Note Details  Waymon Amato, MD File Time 04/25/2020  3:14 PM  Author Type Physician Status Signed  Last Editor Madison Rouge, MD Service Physical Medicine and Rehabilitation

## 2020-04-26 NOTE — Progress Notes (Signed)
Pt arrived to unit, pt alert, skin intact with bilateral swelling to upper extremities.Marland Kitchen

## 2020-04-26 NOTE — Progress Notes (Signed)
Physical Therapy Treatment Patient Details Name: Madison Hurst MRN: [id number] DOB: Aug 28, 1960 Today's Date: 04/26/2020    History of Present Illness 60 year old female with past medical history significant for paroxysmal atrial fibrillation, CAD, essential hypertension, hyperlipidemia, type 2 diabetes mellitus, rheumatoid arthritis, anxiety, depression who presented to the ED with progressive weakness on the left side with rapid progression over the last week resulting in 3 falls. 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    PT Comments    Pt making progress ambulating an increased distance this date. Her safety with mobility is limited by her posterior lean. This improved after being cued to place her elbows onto her knees and find and maintain midline in sitting, but she could only maintain midline for short periods of time before leaning to the R again. She also benefited from physical support at her hips at R ribs with gait. Pt displays coordination, strength, and balance deficits with gait that place her at high risk for falls, needing assistance to shift her weight, avoid drifting to the R, and clear her L foot. Will continue to follow acutely. Current recommendations remain appropriate.   Follow Up Recommendations  CIR     Equipment Recommendations  Rolling walker with 5" wheels;3in1 (PT)    Recommendations for Other Services       Precautions / Restrictions Precautions Precautions: Fall Precaution Comments: EF 20-25% Restrictions Weight Bearing Restrictions: No    Mobility  Bed Mobility Overal bed mobility: Needs Assistance Bed Mobility: Sit to Supine       Sit to supine: Min assist   General bed mobility comments: MinA to manage legs to return to supine in bed. MinA to hold feet in ploace with cues to lift buttocks and push through feet to transition superiorly in bed, success.  Transfers Overall  transfer level: Needs assistance Equipment used: Rolling walker (2 wheeled) Transfers: Sit to/from [employer] Sit to Stand: Mod assist Stand pivot transfers: Mod assist       General transfer comment: Sit to stand 4x from chair and 1x from EOB, requiring cues with good carryover for hand placement but needing modA due to posterior lean. ModA to stabilize pt with stand step EOB <> recliner 1x each direction, cuing for transition of hands from one surface to another.  Ambulation/Gait Ambulation/Gait assistance: Mod assist Gait Distance (Feet): 13 Feet (x3 bouts of ~4 ft > ~8 ft > ~13 ft) Assistive device: Rolling walker (2 wheeled) Gait Pattern/deviations: Step-to pattern;Decreased step length - left;Decreased dorsiflexion - left;Decreased weight shift to right;Decreased weight shift to left;Drifts right/left;Trunk flexed Gait velocity: reduced Gait velocity interpretation: <1.31 ft/sec, indicative of household ambulator General Gait Details: pt with short step-to gait, requires verbal cues and tactile cueing to facilitate weight shift and to aide in L foot clearance. Pt tends to drift to the R, especially with fatigue, needing assistance to direct RW to L. Pt flexes at trunk laterally to R and benefits from physical support at her hips during gait. Posterior lean resulting in bouts of LOB and modA to recover. Cued pt to place weight through toes, extend hips, bring chest over toes with momentary success only.   Stairs             Wheelchair Mobility    Modified Rankin (Stroke Patients Only) Modified Rankin (Stroke Patients Only) Pre-Morbid Rankin Score: Moderate disability Modified Rankin: Moderately severe disability     Balance Overall balance assessment: Needs assistance  Sitting-balance support: Single extremity supported;Feet supported Sitting balance-Leahy Scale: Poor Sitting balance - Comments: UE support and min guard for static sitting EOB. Cued pt to  lean anteriorly, placing elbows on knees and finding midline to decrease posterior lean, success. Postural control: Posterior lean Standing balance support: Bilateral upper extremity supported Standing balance-Leahy Scale: Poor Standing balance comment: External assist and B UE support.                            Cognition Arousal/Alertness: Awake/alert Behavior During Therapy: WFL for tasks assessed/performed Overall Cognitive Status: Impaired/Different from baseline Area of Impairment: Safety/judgement;Awareness;Problem solving;Memory;Following commands                     Memory: Decreased recall of precautions;Decreased short-term memory Following Commands: Follows one step commands consistently;Follows one step commands with increased time Safety/Judgement: Decreased awareness of safety;Decreased awareness of deficits Awareness: Emergent Problem Solving: Slow processing;Difficulty sequencing;Requires verbal cues;Requires tactile cues General Comments: Pt with slow processing of cues, requiring extensive multi-modal cues to sequence weight shifting for gait. Decreased awareness and correction of her posterior lean, impacting her safety.      Exercises      General Comments        Pertinent Vitals/Pain Pain Assessment: 0-10 Pain Score: 8  Pain Location: dorsal aspect of bil feet Pain Descriptors / Indicators: Constant Pain Intervention(s): Limited activity within patient's tolerance;Monitored during session;Repositioned;Patient requesting pain meds-RN notified    Home Living                      Prior Function            PT Goals (current goals can now be found in the care plan section) Acute Rehab PT Goals Patient Stated Goal: to get better PT Goal Formulation: With patient/family Time For Goal Achievement: 05/08/20 Potential to Achieve Goals: Good Progress towards PT goals: Progressing toward goals    Frequency    Min  4X/week      PT Plan Current plan remains appropriate    Co-evaluation              AM-PAC PT "6 Clicks" Mobility   Outcome Measure  Help needed turning from your back to your side while in a flat bed without using bedrails?: A Little Help needed moving from lying on your back to sitting on the side of a flat bed without using bedrails?: A Lot Help needed moving to and from a bed to a chair (including a wheelchair)?: A Lot Help needed standing up from a chair using your arms (e.g., wheelchair or bedside chair)?: A Lot Help needed to walk in hospital room?: A Lot Help needed climbing 3-5 steps with a railing? : Total 6 Click Score: 12    End of Session Equipment Utilized During Treatment: Gait belt Activity Tolerance: Patient tolerated treatment well Patient left: with call bell/phone within reach;in bed;with bed alarm set;with family/visitor present Nurse Communication: Mobility status;Patient requests pain meds PT Visit Diagnosis: Unsteadiness on feet (R26.81);Other abnormalities of gait and mobility (R26.89);Muscle weakness (generalized) (M62.81);Hemiplegia and hemiparesis;Difficulty in walking, not elsewhere classified (R26.2);Other symptoms and signs involving the nervous system (R29.898) Hemiplegia - Right/Left: Left Hemiplegia - caused by: Cerebral infarction     Time: 1352-1440 PT Time Calculation (min) (ACUTE ONLY): 48 min  Charges:  $Gait Training: 23-37 mins $Therapeutic Activity: 8-22 mins  Raymond Gurney, PT, DPT Acute Rehabilitation Services  Pager: [phone] Office: [phone]    Jewel Baize 04/26/2020, 3:29 PM

## 2020-04-26 NOTE — H&P (Addendum)
Physical Medicine and Rehabilitation Admission H&P    Chief Complaint  Patient presents with  . Weakness    Generalized weakness and "not herself" x 2 weeks.  Fall today  : HPI: Madison Hurst is a 60 year old right-handed female with history of paroxysmal atrial tachycardia followed by Dr.Kardie Tobb, CAD, hypertension, diabetes mellitus, chronic anemia, hyperlipidemia, rheumatoid arthritis maintained on chronic prednisone, anxiety and depression.  History taken from chart review and patient.  Patient lives with spouse.  1 level home 2 steps to entry.  Patient reports ambulating independently but with poor balance.  She presented on 04/22/2020 with progressive left hemiparesis over 1 week resulting in 3 falls.  Admission chemistries glucose 173, AST 65, ALT 62, alkaline phosphatase, alcohol 162, negative hemoglobin A1c 6.1.  Cranial CT unremarkable for acute intracranial process.  Patient did not receive TPA.  MRI showed acute infarction affecting the right mid posterior caudate and right superior thalamus.  Single separate punctate acute infarct in the cortex of the medial right parieto-occipital junction.  Few small punctate acute infarcts in the right occipital cortex.  No evidence of hemorrhage or mass-effect.  CT angiogram of head and neck mild stenosis of the left PCA P3 segment.  No emergent large vessel occlusion.  MRI cervical spine showed apparent moderate to severe cervical spinal stenosis at C4-C5 and C5-C6, and moderate at C3-4.  Multilevel neuroforaminal narrowing greatest at C3-4 and on the right C4-5.  Echocardiogram with ejection fraction of 20-25%, with left ventricle global hypokinesis. TEE showed mild to moderate MR.  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  No LAA thrombus.  Lower extremity Dopplers negative.  Orders were given for a video monitor to further evaluate etiology of her CVA.  Currently maintained on aspirin 81 mg daily as well as Eliquis.   LFTs have continued to be elevated and monitored.  Acute hepatitis panel negative.  Her Crestor was discontinued due to elevated LFTs.  Ultrasound of the abdomen showed no focal lesion identified.  Gallbladder was absent.  Study otherwise unremarkable.  Autoimmune markers are pending.  There is some question of possible need for liver biopsy however felt to be somewhat risky while maintained on Eliquis gastroenterology services Dr. Marina Goodell has been consulted to follow rising LFTs.  Due to patient's progressive left hemiparesis, she was admitted for a comprehensive rehab program.  Please see preadmission assessment from earlier today as well.  Review of Systems  Constitutional: Positive for malaise/fatigue. Negative for chills and fever.  HENT: Negative for hearing loss.   Eyes: Negative for blurred vision and double vision.  Respiratory: Negative for cough and shortness of breath.   Cardiovascular: Negative for chest pain and palpitations.  Gastrointestinal: Positive for constipation. Negative for heartburn, nausea and vomiting.  Genitourinary: Negative for dysuria, flank pain and hematuria.  Musculoskeletal: Positive for joint pain and myalgias.  Skin: Negative for rash.  Neurological: Positive for dizziness and weakness.  Psychiatric/Behavioral: Positive for depression. The patient has insomnia.        Anxiety  All other systems reviewed and are negative.  Past Medical History:  Diagnosis Date  . Anemia 02/09/2020  . Anxiety 10/28/2019  . Depressed left ventricular ejection fraction 03/13/2020  . Depressive disorder 10/28/2019  . DOE (dyspnea on exertion) 03/13/2020  . Hypothyroidism 02/09/2020  . Iron deficiency anemia 01/24/2020  . Metabolic syndrome 03/13/2020  . Multinodular goiter 03/07/2015  . PAC (premature atrial contraction) 03/13/2020  . PAT (paroxysmal atrial tachycardia) (HCC) 03/13/2020  .  Pericardial effusion 02/09/2020  . 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 Type 2 diabetes mellitus (HCC) 05/08/2019  . Vitamin D deficiency 02/09/2020   Past Surgical History:  Procedure Laterality Date  . BUBBLE STUDY  04/24/2020   Procedure: BUBBLE STUDY;  Surgeon: Meriam Sprague, MD;  Location: [facility] ENDOSCOPY;  Service: Cardiovascular;;  . CESAREAN SECTION    . CHOLECYSTECTOMY    . other     Growth removal on chest  . TEE WITHOUT CARDIOVERSION N/A 04/24/2020   Procedure: TRANSESOPHAGEAL ECHOCARDIOGRAM (TEE);  Surgeon: Meriam Sprague, MD;  Location: [facility] ENDOSCOPY;  Service: Cardiovascular;  Laterality: N/A;  . TOTAL SHOULDER ARTHROPLASTY Left    Family History  Problem Relation Age of Onset  . Muscular dystrophy Mother   . Diabetes Father   . Diabetes Brother   . Heart disease Brother    Social History:  reports that she quit smoking about 26 years ago. She has never used smokeless tobacco. No history on file for alcohol use and drug use. Allergies:  Allergies  Allergen Reactions  . Sulfa Antibiotics   . Sulfamethoxazole-Trimethoprim Rash   Medications Prior to Admission  Medication Sig Dispense Refill  . Abatacept (ORENCIA Brimson) Inject into the skin once a week.    Marland Kitchen aspirin EC 81 MG tablet Take 1 tablet (81 mg total) by mouth daily. Swallow whole. 90 tablet 3  . buPROPion (WELLBUTRIN XL) 150 MG 24 hr tablet Take 150 mg by mouth daily.    . DULoxetine (CYMBALTA) 60 MG capsule 60 mg.    . ergocalciferol (VITAMIN D2) 1.25 MG (50000 UT) capsule Take 50,000 Units by mouth once a week.    . flurbiprofen (ANSAID) 100 MG tablet Take 100 mg by mouth 2 (two) times daily.    . folic acid (FOLVITE) 1 MG tablet Take 2 mg by mouth daily.    Marland Kitchen HYDROcodone-acetaminophen (NORCO/VICODIN) 5-325 MG tablet Take 1 tablet by mouth every 6 (six) hours as needed for moderate pain.    Marland Kitchen levothyroxine (SYNTHROID) 75 MCG tablet Take 75 mcg by mouth daily before breakfast.    .  metFORMIN (GLUMETZA) 500 MG (MOD) 24 hr tablet Take 500 mg by mouth daily with breakfast.    . metoprolol succinate (TOPROL XL) 25 MG 24 hr tablet Take 0.5 tablets (12.5 mg total) by mouth daily. 30 tablet 3  . Multiple Vitamin (MULTIVITAMIN) capsule Take 1 capsule by mouth daily.    Marland Kitchen ORENCIA CLICKJECT 125 MG/ML SOAJ Inject 125 mg into the skin once a week.    . predniSONE (DELTASONE) 5 MG tablet Take 5 mg by mouth daily.    . rosuvastatin (CRESTOR) 20 MG tablet Take 1 tablet (20 mg total) by mouth daily. 90 tablet 3  . telmisartan (MICARDIS) 20 MG tablet Take 20 mg by mouth daily.      Drug Regimen Review Drug regimen was reviewed and remains appropriate with no significant issues identified  Home: Home Living Family/patient expects to be discharged to:: Private residence Living Arrangements: Spouse/significant other Available Help at Discharge: Family,Available PRN/intermittently Type of Home: House Home Access: Stairs to enter [employer] of Steps: 2 Entrance Stairs-Rails: Can reach both Home Layout: One level Bathroom Shower/Tub: Engineer, manufacturing systems: Standard Home Equipment: Gilmer Mor - single point,Wheelchair - manual,Shower seat,Bedside commode Additional Comments: husband supportive at bedside  Lives With: Spouse,Son   Functional History: Prior Function Level of Independence: Needs assistance Gait /  Transfers Assistance Needed: pt reports being indep for ADLs' and mobility; but noted that reporting to PT services that she require A for picking items up off the floor ADL's / Homemaking Assistance Needed: intermittent min A  Functional Status:  Mobility: Bed Mobility Overal bed mobility: Needs Assistance Bed Mobility: Supine to Sit Supine to sit: Mod assist,HOB elevated Sit to supine: Max assist,HOB elevated General bed mobility comments: use of bed rail Transfers Overall transfer level: Needs assistance Equipment used: 1 person hand held  assist,Rolling walker (2 wheeled) Transfers: Sit to/from [employer] Sit to Stand: Min assist Stand pivot transfers: Mod assist General transfer comment: initial sit to stand with minA and PT hand hold, SPT with PT hand hold. 3 transfers from recliner with RW and minA Ambulation/Gait Ambulation/Gait assistance: Mod assist Gait Distance (Feet): 10 Feet Assistive device: Rolling walker (2 wheeled) Gait Pattern/deviations: Step-to pattern,Decreased step length - left,Decreased dorsiflexion - left,Decreased weight shift to right,Decreased weight shift to left General Gait Details: pt with short step-to gait, requires verbal cues and tactile cueing to facilitate weight shift and to aide in L foot clearance Gait velocity: reduced Gait velocity interpretation: <1.31 ft/sec, indicative of household ambulator    ADL: ADL Overall ADL's : Needs assistance/impaired Grooming: Minimal assistance,Sitting Upper Body Dressing : Moderate assistance,Sitting Lower Body Dressing: Maximal assistance,Cueing for safety Toilet Transfer: Moderate assistance,Stand-pivot,Cueing for safety General ADL Comments: difficulty with assessing what appears as an accurate level of performance for completion of ADL's d/t inability to sustain level of arousal. requiring anywhere from min-max A for safe completion of seated and standing ADL's. will continue to assess level of performance with further participation with therapy session. anticiapte with improved arousal pt will demo increased performance.  Cognition: Cognition Overall Cognitive Status: Impaired/Different from baseline Arousal/Alertness: Awake/alert Orientation Level: Oriented to person,Oriented to place,Oriented to time Attention: Focused,Alternating Focused Attention: Impaired Focused Attention Impairment: Verbal complex,Functional complex Alternating Attention: Impaired Alternating Attention Impairment: Verbal complex,Functional  complex Memory: Impaired Memory Impairment: Decreased recall of new information,Decreased short term memory Decreased Short Term Memory: Verbal complex,Functional complex Problem Solving: Impaired Problem Solving Impairment: Verbal complex,Functional complex Executive Function: Self Monitoring,Organizing Organizing: Impaired Organizing Impairment: Verbal complex,Functional complex Self Monitoring: Impaired Self Monitoring Impairment: Verbal complex,Functional complex Safety/Judgment: Impaired Cognition Arousal/Alertness: Awake/alert Behavior During Therapy: WFL for tasks assessed/performed Overall Cognitive Status: Impaired/Different from baseline Area of Impairment: Safety/judgement,Awareness,Problem solving Orientation Level: Disoriented to,Time Current Attention Level: Sustained Memory: Decreased recall of precautions,Decreased short-term memory Following Commands: Follows one step commands consistently Safety/Judgement: Decreased awareness of safety Awareness: Emergent Problem Solving: Slow processing  Physical Exam: Blood pressure 116/85, pulse 90, temperature 98.7 F (37.1 C), temperature source Oral, resp. rate 18, height 5' (1.524 m), weight 58.4 kg, SpO2 99 %. Physical Exam Vitals reviewed.  Constitutional:      General: She is not in acute distress.    Appearance: She is ill-appearing.  HENT:     Head: Normocephalic and atraumatic.     Right Ear: External ear normal.     Left Ear: External ear normal.     Nose: Nose normal.  Eyes:     General:        Right eye: No discharge.        Left eye: No discharge.     Extraocular Movements: Extraocular movements intact.  Cardiovascular:     Rate and Rhythm: Normal rate and regular rhythm.  Pulmonary:     Effort: Pulmonary effort is normal. No respiratory distress.  Breath sounds: No stridor.  Abdominal:     General: Abdomen is flat. Bowel sounds are normal. There is no distension.  Musculoskeletal:     Cervical  back: Normal range of motion and neck supple.     Comments: Tenderness in hands and feet Dactylitis in all extremities  Skin:    Comments: Erythema in digits  Neurological:     Mental Status: She is alert.     Comments: Alert  Makes eye contact with examiner.   Provides her name and age.   Follows simple commands. Motor: RUE: 3 -/5 proximal distal LUE: 2+/5 proximal distal RLE: 2+/5 proximal distal LLE: 2/5 proximal to distal  Psychiatric:        Mood and Affect: Affect is blunt.        Speech: Speech is delayed.        Behavior: Behavior is slowed.     Results for orders placed or performed during the hospital encounter of 04/22/20 (from the past 48 hour(s))  Glucose, capillary     Status: Abnormal   Collection Time: 04/24/20  8:43 AM  Result Value Ref Range   Glucose-Capillary 106 (H) 70 - 99 mg/dL    Comment: Glucose reference range applies only to samples taken after fasting for at least 8 hours.  Glucose, capillary     Status: Abnormal   Collection Time: 04/24/20 12:19 PM  Result Value Ref Range   Glucose-Capillary 113 (H) 70 - 99 mg/dL    Comment: Glucose reference range applies only to samples taken after fasting for at least 8 hours.  Glucose, capillary     Status: Abnormal   Collection Time: 04/24/20  4:46 PM  Result Value Ref Range   Glucose-Capillary 123 (H) 70 - 99 mg/dL    Comment: Glucose reference range applies only to samples taken after fasting for at least 8 hours.  Glucose, capillary     Status: Abnormal   Collection Time: 04/24/20  9:15 PM  Result Value Ref Range   Glucose-Capillary 144 (H) 70 - 99 mg/dL    Comment: Glucose reference range applies only to samples taken after fasting for at least 8 hours.   Comment 1 Notify RN    Comment 2 Document in Chart   T4, free     Status: Abnormal   Collection Time: 04/24/20 10:11 PM  Result Value Ref Range   Free T4 1.88 (H) 0.61 - 1.12 ng/dL    Comment: (NOTE) Biotin ingestion may interfere with free T4  tests. If the results are inconsistent with the TSH level, previous test results, or the clinical presentation, then consider biotin interference. If needed, order repeat testing after stopping biotin. Performed at [facility], [address], [place], [place] [address]   Comprehensive metabolic panel     Status: Abnormal   Collection Time: 04/25/20  3:57 AM  Result Value Ref Range   Sodium 137 135 - 145 mmol/L   Potassium 6.0 (H) 3.5 - 5.1 mmol/L    Comment: SPECIMEN HEMOLYZED. HEMOLYSIS MAY AFFECT INTEGRITY OF RESULTS.   Chloride 104 98 - 111 mmol/L   CO2 22 22 - 32 mmol/L   Glucose, Bld 110 (H) 70 - 99 mg/dL    Comment: Glucose reference range applies only to samples taken after fasting for at least 8 hours.   BUN 13 6 - 20 mg/dL   Creatinine, Ser 3.73 0.44 - 1.00 mg/dL   Calcium 8.3 (L) 8.9 - 10.3 mg/dL   Total  Protein 4.9 (L) 6.5 - 8.1 g/dL   Albumin 2.5 (L) 3.5 - 5.0 g/dL   AST 244 (H) 15 - 41 U/L   ALT 606 (H) 0 - 44 U/L   Alkaline Phosphatase 386 (H) 38 - 126 U/L   Total Bilirubin 1.4 (H) 0.3 - 1.2 mg/dL   GFR, Estimated >01 >02 mL/min    Comment: (NOTE) Calculated using the CKD-EPI Creatinine Equation (2021)    Anion gap 11 5 - 15    Comment: Performed at [facility], [address], [place], [place] [address]  Glucose, capillary     Status: Abnormal   Collection Time: 04/25/20  6:07 AM  Result Value Ref Range   Glucose-Capillary 107 (H) 70 - 99 mg/dL    Comment: Glucose reference range applies only to samples taken after fasting for at least 8 hours.   Comment 1 Notify RN    Comment 2 Document in Chart   Glucose, capillary     Status: Abnormal   Collection Time: 04/25/20 11:57 AM  Result Value Ref Range   Glucose-Capillary 100 (H) 70 - 99 mg/dL    Comment: Glucose reference range applies only to samples taken after fasting for at least 8 hours.  Glucose, capillary     Status: Abnormal   Collection Time: 04/25/20  4:53 PM  Result Value Ref Range    Glucose-Capillary 116 (H) 70 - 99 mg/dL    Comment: Glucose reference range applies only to samples taken after fasting for at least 8 hours.  Basic metabolic panel     Status: Abnormal   Collection Time: 04/25/20  5:10 PM  Result Value Ref Range   Sodium 135 135 - 145 mmol/L   Potassium >7.5 (HH) 3.5 - 5.1 mmol/L    Comment: HEMOLYSIS AT THIS LEVEL MAY AFFECT RESULT CRITICAL RESULT CALLED TO, READ BACK BY AND VERIFIED WITH: T.BARNES,RN @1811  04/25/2020 VANG.J    Chloride 102 98 - 111 mmol/L   CO2 14 (L) 22 - 32 mmol/L   Glucose, Bld 130 (H) 70 - 99 mg/dL    Comment: Glucose reference range applies only to samples taken after fasting for at least 8 hours.   BUN 14 6 - 20 mg/dL   Creatinine, Ser 04/27/2020 0.44 - 1.00 mg/dL   Calcium 8.6 (L) 8.9 - 10.3 mg/dL   GFR, Estimated 6.44 >03 mL/min    Comment: (NOTE) Calculated using the CKD-EPI Creatinine Equation (2021)    Anion gap 19 (H) 5 - 15    Comment: Performed at [facility], [address] [place], [place]  Potassium     Status: None   Collection Time: 04/25/20  8:36 PM  Result Value Ref Range   Potassium 4.8 3.5 - 5.1 mmol/L    Comment: SLIGHT HEMOLYSIS Performed at [facility], [address], [place], [place]   Glucose, capillary     Status: Abnormal   Collection Time: 04/25/20  9:16 PM  Result Value Ref Range   Glucose-Capillary 143 (H) 70 - 99 mg/dL    Comment: Glucose reference range applies only to samples taken after fasting for at least 8 hours.   Comment 1 Notify RN    Comment 2 Document in Chart    ECHO TEE  Result Date: 04/24/2020    TRANSESOPHOGEAL ECHO REPORT   Patient Name:   Madison Hurst Date of Exam: 04/24/2020 Medical Rec #:  04/26/2020  Height:       60.0 in Accession #:    [id number]                    Weight:       128.7 lb Date of Birth:  September 23, 1960                    BSA:          1.548 m Patient Age:    59 years                      BP:            135/98 mmHg Patient Gender: F                             HR:           90 bpm. Exam Location:  Inpatient Procedure: Transesophageal Echo, 3D Echo, Color Doppler and Cardiac Doppler Indications:     Stroke i163.9  History:         Patient has prior history of Echocardiogram examinations, most                  recent 04/23/2020. Risk Factors:Hypertension and Diabetes.  Sonographer:     Irving Burton Senior RDCS Referring Phys:  [id number] Kathlynn Grate P[place]ON Diagnosing Phys: Laurance Flatten MD PROCEDURE: After discussion of the risks and benefits of a TEE, an informed consent was obtained from the patient. The transesophogeal probe was passed without difficulty through the esophogus of the patient. Local oropharyngeal anesthetic was provided with Cetacaine. Sedation performed by different physician. The patient was monitored while under deep sedation. Anesthestetic sedation was provided intravenously by Anesthesiology: 66mg  of Propofol. The patient developed no complications during the procedure. IMPRESSIONS  1. Left ventricular ejection fraction, by estimation, is 20 to 25%. The left ventricle has severely decreased function. The left ventricle demonstrates global hypokinesis.  2. Right ventricular systolic function is moderately reduced. The right ventricular size is normal.  3. Left atrial size was moderately dilated. No left atrial/left atrial appendage thrombus was detected. The LAA emptying velocity was 30 cm/s.  4. Right atrial size was mildly dilated.  5. The mitral valve is degenerative. Mild to moderate mitral valve regurgitation.  6. The aortic valve is tricuspid. There is mild calcification of the aortic valve. There is mild thickening of the aortic valve. Aortic valve regurgitation is trivial.  7. 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  8. A small PFO was detected by color flow Doppler with predominant L-->R shunting, however, there  is flow reversal (R-->L) with valsalva.  9. Agitated saline contrast bubble study was positive with shunting observed within 3-6 cardiac cycles with valsalva suggestive of interatrial shunt. FINDINGS  Left Ventricle: Left ventricular ejection fraction, by estimation, is 20 to 25%. The left ventricle has severely decreased function. The left ventricle demonstrates global hypokinesis. The left ventricular internal cavity size was normal in size. Right Ventricle: The right ventricular size is normal. No increase in right ventricular wall thickness. Right ventricular systolic function is moderately reduced. Left Atrium: Left atrial size was moderately dilated. No left atrial/left atrial appendage thrombus was detected. The LAA emptying velocity was 30 cm/s. Right Atrium: Right atrial size was mildly dilated. Pericardium: There is no evidence of pericardial effusion. Mitral Valve: The mitral valve is  degenerative in appearance. There is mild thickening of the mitral valve leaflet(s). There is mild calcification of the mitral valve leaflet(s). Mild to moderate mitral valve regurgitation. Tricuspid Valve: The tricuspid valve is normal in structure. Tricuspid valve regurgitation is mild. Aortic Valve: The aortic valve is tricuspid. There is mild calcification of the aortic valve. There is mild thickening of the aortic valve. Aortic valve regurgitation is trivial. 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 Pulmonic Valve: The pulmonic valve was normal in structure. Pulmonic valve regurgitation is trivial. Aorta: The aortic root and ascending aorta are structurally normal, with no evidence of dilitation. IAS/Shunts: Evidence of atrial level shunting detected by color flow Doppler. Agitated saline contrast was given intravenously to evaluate for intracardiac shunting. Agitated saline contrast bubble study was positive with shunting observed within 3-6 cardiac  cycles suggestive of interatrial shunt. A small patent foramen ovale is detected. Laurance Flatten MD Electronically signed by Laurance Flatten MD Signature Date/Time: 04/24/2020/9:38:38 AM    Final    US Abdomen Limited RUQ (LIVER/GB)  Result Date: 04/24/2020 CLINICAL DATA:  Elevated liver enzymes EXAM: ULTRASOUND ABDOMEN LIMITED RIGHT UPPER QUADRANT COMPARISON:  None. FINDINGS: Gallbladder: Surgically absent. Common bile duct: Diameter: 5 mm. No intrahepatic or extrahepatic biliary duct dilatation. Liver: No focal lesion identified. Within normal limits in parenchymal echogenicity. Portal vein is patent on color Doppler imaging with normal direction of blood flow towards the liver. Other: None. IMPRESSION: Gallbladder absent.  Study otherwise unremarkable. Electronically Signed   By: Bretta Bang III M.D.   On: 04/24/2020 15:05       Medical Problem List and Plan: 1.  Left hemiparesis secondary to right MCA/PCA, thalamus, right PLIC and CR scattered small punctate infarcts  -patient may shower as tolerated  -ELOS/Goals: 14-18 days/supervision/min a  Admit to CIR 2.  Antithrombotics: -DVT/anticoagulation: Eliquis 5 mg twice daily.  Lower extremity Dopplers negative  -antiplatelet therapy: Aspirin 81 mg daily 3. Pain Management: Hydrocodone as needed  Monitor with increased exertion 4. Mood: Cymbalta 60 mg daily, Wellbutrin 150 mg daily, Ativan 0.5 mg every 8 hours as needed  -antipsychotic agents: N/A 5. Neuropsych: This patient is capable of making decisions on her own behalf. 6. Skin/Wound Care: Routine skin checks 7. Fluids/Electrolytes/Nutrition: Routine in and outs.  CMP ordered 8.  History of PAF/CAD/PFO.  30-day cardiac event monitor per cardiology services Dr.Kardie Tobb.  Continue Eliquis for now.  If EF improves and no atrial fibrillation in 3 months plan to stop Eliquis and restart Plavix.  Monitor with increased activity 9.  Transaminitis.  Follow-up gastroenterology  service Dr. Marina Goodell.    Autoimmune markers pending  CMP ordered 10.  Hyperlipidemia.  Crestor on hold due to elevated LFTs. 11.  Rheumatoid arthritis: Low-dose prednisone as well as weekly Orencia  May need to increase prednisone 12.  Diabetes mellitus.  Hemoglobin A1c 6.1.  SSI.  Patient on Metformin 500 mg daily prior to admission.  Resume as needed  Monitor increase mobility 13.  Hypothyroidism: Synthroid 14.  Essential hypertension.. Blood pressure had remained a bit soft Toprol-XL resumed at 12.5 mg daily 04/26/2020  Monitor with increased mobility  Charlton Amor, PA-C 04/26/2020  I have personally performed a face to face diagnostic evaluation, including, but not limited to relevant history and physical exam findings, of this patient and developed relevant assessment and plan.  Additionally, I have reviewed and concur with the physician assistant's documentation above.  Maryla Morrow, MD,  ABPMR  The patient's status has not changed. Any changes from the pre-admission screening or documentation from the acute chart are noted above.   Delice Lesch, MD, ABPMR

## 2020-04-26 NOTE — Progress Notes (Signed)
PROGRESS NOTE    Madison Hurst  YTK:[id number] DOB: [birth year] DOA: 04/22/2020 PCP: Serita Grammes, MD    Brief Narrative:  Madison Hurst is a 60 year old female with past medical history significant for paroxysmal atrial fibrillation, CAD, essential hypertension, hyperlipidemia, type 2 diabetes mellitus, rheumatoid arthritis, anxiety, depression who presented to the ED with progressive weakness on the left side with rapid progression over the last week.  Patient reports over the last 2 days, falls on 3 occasions.  Patient states when she fell today, she hit her head slightly.  Patient also with weakness feeling like she is going to pass out, but not as loss of consciousness or syncopal event.  Patient also reports associated headaches that are intermittent with associated sweats; in which these have been present for roughly 1 year.  In the ED, afebrile, HR 125, RR 14, BP 125/102, SPO2 100% on room air.  Sodium 137, potassium 3.7, chloride 98, CO2 28, glucose 173, BUN 15, creatinine 0.59, AST 65, ALT 62.  CK 87.  WBC 13.2, hemoglobin 14.9, platelets 342.  INR 1.3.  TSH 0.740.  SARS-CoV-2 negative.  EtOH level less than 10.  Urinalysis with moderate leukocytes, negative nitrite, rare bacteria, 11-20 WBCs.  UDS positive for opiates.  CT head without contrast with no acute intracranial abnormality.  MRI head positive for acute infarction consistent with posterior circulation embolic disease without signs of hemorrhage.  Patient was given 1 L NS bolus, 5 mg of metoprolol IV and 3 and 24 mg of aspirin.  Neurology was consulted.  Hospitalist service consulted for further evaluation and management of acute CVA.   Assessment & Plan:   Principal Problem:   CVA (cerebral vascular accident) ([place]) Active Problems:   Anxiety   Hypothyroidism   Seropositive rheumatoid arthritis ([place])   Type 2 diabetes mellitus (HCC)   PAT (paroxysmal atrial tachycardia) (HCC)    Leukocytosis   Essential hypertension   Elevated liver function tests   CVA, acute; concerning for embolic etiology Patient presenting to the ED following several falls with associated left-sided weakness progressing over the last week.  MR brain notable for acute infarct consistent with posterior circulation embolic disease.  CTA head/neck with no large vessel occlusion.  Total cholesterol 159, HDL 49, LDL 88.  ESR 3.  TSH 0.740.  Hemoglobin A1c 6.1.  TTE with LVEF 20-25% (40-45% Dec 2021), LV global hypokinesis, RV systolic function within normal limits, LA moderately dilated, IVC normal in size.  TEE 1/26 with LVEF 20-25 percent, with global hypokinesis and PFO with left to right shunt; but neurology believes PFO not etiology of her acute stroke. --Neurology following, appreciate assistance --Eliquis 2m BID for concern over embolic event likely related to her cardiomyopathy --ASA 836mPO daily for underlying CAD --EP plans to place event monitor --PT/OT recommends CIR --Continue monitor on telemetry  Paroxysmal atrial tachycardia/sinus tachycardia On admission, patient notable with elevated heart rates in the 120s.  Patient also reports she had an abnormal coronary CT in which she is following with cardiology, Dr. ToHarriet Massonutpatient. --Cardiology following --Beta-blocker held secondary to borderline hypotension --Continue to monitor on telemetry  Patent foramen ovale TEE 1/26 notable for PFO with left-to-right shunt, although neurology believes this is not related to her embolic event.  Ischemic cardiomyopathy Chronic systolic congestive heart failure TTE with LVEF 20-25% (was 40-45% Dec 2021), with LV global hypokinesis. --Likely needs further ischemic work-up with LHC; deferred acutely by cardiology due to her acute stroke; plan outpatient  follow-up --Beta-blocker, ARB, Entresto discontinued by cardiology on 1/27 for hypotension --holding statin 2/2 elevated LFTs --Will need repeat  TTE ED 3 months  Transaminitis: Acute.   R factor equal to 2.87, consistent with mixed hepatocellular/cholestatic injury pattern. RUQ U/S with no focal lesions, normal appearance of parenchymal echogenicity, portal vein patent, gallbladder absent and otherwise unremarkable. Unclear etiology; ?insult from acute CVA or decompensated CHF vs DILI vs autoimmune; no obstruction appreciated on U/S.  --[facility] following, appreciate assistance --Acute hepatitis panel negative --Alkaline phosphatase 162>218>312>386>468 --AST 65>203>569>989>501 --ALT 62>121>313>606>608 --Total bilirubin 0.9>1.1>1.1>1.4>1.4 --Ceruloplasmin slightly elevated at 43.7; but Hx of RA can cause increase --ANA, AsMA, antimicrosomal ab Liver/kidney, tissue transglutaminase IgA: Pending --holding statin --Trend LFTs daily  Essential hypertension Home regimen includes metoprolol 12.5 mg daily and telmisartan 20 mg daily. --ARB and beta-blocker on hold due to hypotension per cardiology  Rheumatoid arthritis:  Patient with swelling of the bilateral hands that she states is chronic due to rheumatoid arthritis.  Home medication regimen includes prednisone 5 mg daily, Orencia, flurbiprofen 100 mg twice daily. --Continue prednisone  --Holding home flurbiprofen due to risk of thrombotic event  Diabetes mellitus type 2:  On admission glucose elevated up to 173.  Home medications include Metformin 500 mg daily.  Hemoglobin A1c 6.1, well controlled. --Hold Metformin --Sensitive SSI for coverage --CBGs qAC/HS  Anxiety and depression --Continue home Wellbutrin 150 mg daily and Cymbalta 60 mg daily  Hyperlipidemia:  Lipid panel with total cholesterol 159, HDL 49, LDL 88. --Holding home Crestor due to elevated LFTs --Monitor LFTs daily  Hypothyroidism:  TSH 0.74, Free T4 1.88, Free T3 2.1 --Continue home levothyroxine 75 mcg daily.  Hypokalemia: resolved Hypomagnesemia: resolved --BMP daily  DVT prophylaxis:  Lovenox   Code Status: Full Code Family Communication: Patient updated at bedside, spouse not present this morning.  Disposition Plan:  Level of care: Telemetry Medical Status is: Inpatient  Remains inpatient appropriate because:Ongoing diagnostic testing needed not appropriate for outpatient work up, Unsafe d/c plan, IV treatments appropriate due to intensity of illness or inability to take PO and Inpatient level of care appropriate due to severity of illness   Dispo: The patient is from: Home              Anticipated d/c is to: CIR              Anticipated d/c date is: 1 day              Patient currently is medically stable to d/c.   Difficult to place patient No   Consultants:   Cardiology  Neurology  [facility]  Procedures:   Vascular duplex ultrasound bilateral lower extremities  TTE  Right upper quadrant ultrasound  Antimicrobials:   None   Subjective: Patient seen and examined bedside, sitting in bedside chair.  Watching TV.  Continues with left upper extremity weakness, slightly improved.  Reports left lower extremity weakness much improved since admission.  No other specific complaints this morning.  Denies headache, no fever/chills/night sweats, no nausea/vomiting/diarrhea, no chest pain, no palpitations, no shortness of breath, no abdominal pain.  No acute events overnight per nurse staff.  Objective: Vitals:   04/25/20 2340 04/26/20 0336 04/26/20 0753 04/26/20 1117  BP: (!) 111/97 116/85 137/79 118/77  Pulse: 74 90 89 97  Resp: 18 18 18 16   Temp: 97.8 F (36.6 C) 98.7 F (37.1 C) 98.6 F (37 C) (!) 96.9 F (36.1 C)  TempSrc: Oral Oral Oral Oral  SpO2:  98% 99% 98% 95%  Weight:      Height:        Intake/Output Summary (Last 24 hours) at 04/26/2020 1201 Last data filed at 04/26/2020 1100 Gross per 24 hour  Intake 480 ml  Output 2301 ml  Net -1821 ml   Filed Weights   04/22/20 1258 04/23/20 1552  Weight: 55.8 kg 58.4 kg     Examination:  General exam: Appears calm and comfortable  Respiratory system: Clear to auscultation. Respiratory effort normal.  Oxygenating well on room air Cardiovascular system: S1 & S2 heard, RRR. No JVD, murmurs, rubs, gallops or clicks. No pedal edema. Gastrointestinal system: Abdomen is nondistended, soft and nontender. No organomegaly or masses felt. Normal bowel sounds heard. Central nervous system: Alert and oriented.  Left upper/lower extremity weakness deficits noted, otherwise no focal neurological deficits appreciated. Extremities: Muscle strength decreased LUE 4/5 with decreased grip strength and LLE 4-/5 Skin: No rashes, lesions or ulcers Psychiatry: Judgement and insight appear poor. Mood & affect appropriate.     Data Reviewed: I have personally reviewed following labs and imaging studies  CBC: Recent Labs  Lab 04/22/20 1342 04/23/20 0337 04/24/20 0114  WBC 13.2* 13.0* 12.5*  NEUTROABS 10.6*  --   --   HGB 14.9 14.5 13.5  HCT 49.0* 47.7* 42.5  MCV 100.8* 102.4* 102.2*  PLT 342 319 354   Basic Metabolic Panel: Recent Labs  Lab 04/23/20 0337 04/24/20 0114 04/25/20 0357 04/25/20 1710 04/25/20 2036 04/26/20 0550  NA 136 138 137 135  --  138  K 3.5 3.8 6.0* >7.5* 4.8 4.1  CL 99 101 104 102  --  98  CO2 24 24 22  14*  --  27  GLUCOSE 127* 131* 110* 130*  --  102*  BUN 13 13 13 14   --  10  CREATININE 0.64 0.55 0.72 0.66  --  0.61  CALCIUM 8.0* 8.3* 8.3* 8.6*  --  8.7*  MG  --  1.6*  --   --   --   --    GFR: Estimated Creatinine Clearance: 62.1 mL/min (by C-G formula based on SCr of 0.61 mg/dL). Liver Function Tests: Recent Labs  Lab 04/22/20 1342 04/23/20 0337 04/24/20 0114 04/25/20 0357 04/26/20 0550  AST 65* 203* 569* 989* 501*  ALT 62* 121* 313* 606* 608*  ALKPHOS 162* 218* 312* 386* 468*  BILITOT 0.9 1.1 1.1 1.4* 1.4*  PROT 5.3* 5.3* 5.1* 4.9* 5.3*  ALBUMIN 2.7* 2.7* 2.6* 2.5* 2.8*   No results for input(s): LIPASE, AMYLASE in the  last 168 hours. No results for input(s): AMMONIA in the last 168 hours. Coagulation Profile: Recent Labs  Lab 04/22/20 1342 04/26/20 0550  INR 1.3* 2.0*   Cardiac Enzymes: Recent Labs  Lab 04/22/20 1342  CKTOTAL 87   BNP (last 3 results) No results for input(s): PROBNP in the last 8760 hours. HbA1C: No results for input(s): HGBA1C in the last 72 hours. CBG: Recent Labs  Lab 04/25/20 0607 04/25/20 1157 04/25/20 1653 04/25/20 2116 04/26/20 0615  GLUCAP 107* 100* 116* 143* 106*   Lipid Profile: No results for input(s): CHOL, HDL, LDLCALC, TRIG, CHOLHDL, LDLDIRECT in the last 72 hours. Thyroid Function Tests: Recent Labs    04/24/20 2158 04/24/20 2211  FREET4  --  1.88*  T3FREE 2.1  --    Anemia Panel: No results for input(s): VITAMINB12, FOLATE, FERRITIN, TIBC, IRON, RETICCTPCT in the last 72 hours. Sepsis Labs: No results for input(s): PROCALCITON, LATICACIDVEN in the  last 168 hours.  Recent Results (from the past 240 hour(s))  SARS Coronavirus 2 by RT PCR (hospital order, performed in [facility] hospital lab) Nasopharyngeal Nasopharyngeal Swab     Status: None   Collection Time: 04/22/20  1:42 PM   Specimen: Nasopharyngeal Swab  Result Value Ref Range Status   SARS Coronavirus 2 NEGATIVE NEGATIVE Final    Comment: (NOTE) SARS-CoV-2 target nucleic acids are NOT DETECTED.  The SARS-CoV-2 RNA is generally detectable in upper and lower respiratory specimens during the acute phase of infection. The lowest concentration of SARS-CoV-2 viral copies this assay can detect is 250 copies / mL. A negative result does not preclude SARS-CoV-2 infection and should not be used as the sole basis for treatment or other patient management decisions.  A negative result may occur with improper specimen collection / handling, submission of specimen other than nasopharyngeal swab, presence of viral mutation(s) within the areas targeted by this assay, and inadequate number of  viral copies (<250 copies / mL). A negative result must be combined with clinical observations, patient history, and epidemiological information.  Fact Sheet for Patients:   StrictlyIdeas.no  Fact Sheet for Healthcare Providers: BankingDealers.co.za  This test is not yet approved or  cleared by the [place] FDA and has been authorized for detection and/or diagnosis of SARS-CoV-2 by FDA under an Emergency Use Authorization (EUA).  This EUA will remain in effect (meaning this test can be used) for the duration of the COVID-19 declaration under Section 564(b)(1) of the Act, 21 U.S.C. section 360bbb-3(b)(1), unless the authorization is terminated or revoked sooner.  Performed at [facility], [place] [address] [place], [place] [address]          Radiology Studies: US Abdomen Limited RUQ (LIVER/GB)  Result Date: 04/24/2020 CLINICAL DATA:  Elevated liver enzymes EXAM: ULTRASOUND ABDOMEN LIMITED RIGHT UPPER QUADRANT COMPARISON:  None. FINDINGS: Gallbladder: Surgically absent. Common bile duct: Diameter: 5 mm. No intrahepatic or extrahepatic biliary duct dilatation. Liver: No focal lesion identified. Within normal limits in parenchymal echogenicity. Portal vein is patent on color Doppler imaging with normal direction of blood flow towards the liver. Other: None. IMPRESSION: Gallbladder absent.  Study otherwise unremarkable. Electronically Signed   By: Lowella Grip III M.D.   On: 04/24/2020 15:05        Scheduled Meds: . apixaban  5 mg Oral BID  . aspirin EC  81 mg Oral Daily  . buPROPion  150 mg Oral Daily  . DULoxetine  60 mg Oral Daily  . folic acid  2 mg Oral Daily  . insulin aspart  0-5 Units Subcutaneous QHS  . insulin aspart  0-9 Units Subcutaneous TID WC  . levothyroxine  75 mcg Oral QAC breakfast  . predniSONE  5 mg Oral Daily   Continuous Infusions:    LOS: 4 days    Time spent: 35 minutes spent on  chart review, discussion with nursing staff, consultants, updating family and interview/physical exam; more than 50% of that time was spent in counseling and/or coordination of care.    Madison Hurst J [place] DO [facility] Available via Epic secure chat 7am-7pm After these hours, please refer to coverage provider listed on [email or web address] 04/26/2020, 12:01 PM

## 2020-04-26 NOTE — Care Management Important Message (Signed)
Important Message  Patient Details  Name: Madison Hurst MRN: [id number] Date of Birth: 1961-03-21   Medicare Important Message Given:  Yes - Important Message mailed due to current National Emergency   Verbal consent obtained due to current National Emergency  Relationship to patient: Spouse/Significant Other Contact Name: Scotty Call Date: 04/26/20  Time: 1311 Phone: [phone] Outcome: Spoke with contact Important Message mailed to: Patient address on file    Orson Aloe 04/26/2020, 1:11 PM

## 2020-04-26 NOTE — Progress Notes (Signed)
Progress Note  Patient Name: Madison Hurst Date of Encounter: 04/26/2020  [facility] Cardiologist: Thomasene Ripple, DO   Subjective   Pt denies CP  Breathing is OK   Inpatient Medications    Scheduled Meds: . apixaban  5 mg Oral BID  . aspirin EC  81 mg Oral Daily  . buPROPion  150 mg Oral Daily  . DULoxetine  60 mg Oral Daily  . folic acid  2 mg Oral Daily  . insulin aspart  0-5 Units Subcutaneous QHS  . insulin aspart  0-9 Units Subcutaneous TID WC  . levothyroxine  75 mcg Oral QAC breakfast  . predniSONE  5 mg Oral Daily   Continuous Infusions:  PRN Meds: HYDROcodone-acetaminophen, LORazepam   Vital Signs    Vitals:   04/25/20 1538 04/25/20 2049 04/25/20 2340 04/26/20 0336  BP: 110/84 127/89 (!) 111/97 116/85  Pulse: 82 80 74 90  Resp: 20 18 18 18   Temp: (!) 97.3 F (36.3 C) 97.9 F (36.6 C) 97.8 F (36.6 C) 98.7 F (37.1 C)  TempSrc: Oral Oral Oral Oral  SpO2: 100% 98% 98% 99%  Weight:      Height:        Intake/Output Summary (Last 24 hours) at 04/26/2020 0819 Last data filed at 04/26/2020 0523 Gross per 24 hour  Intake -  Output 2050 ml  Net -2050 ml   Last 3 Weights 04/23/2020 04/22/2020 03/13/2020  Weight (lbs) 128 lb 12 oz 123 lb 123 lb 3.2 oz  Weight (kg) 58.4 kg 55.792 kg 55.883 kg      Telemetry     NSR  Personally Reviewed  ECG    No new EKG to review - Personally Reviewed  Physical Exam   GEN: No acute distress.   Neck: JVP is normal   Cardiac: RRR, no murmurs Respiratory: Clear to auscultation bilaterally. GI: Soft, nontender, non-distended  MS: Tr LE edema; Neuro:  Nonfocal  Psych: Normal affect   Labs    High Sensitivity Troponin:  No results for input(s): TROPONINIHS in the last 720 hours.    Chemistry Recent Labs  Lab 04/24/20 0114 04/25/20 0357 04/25/20 1710 04/25/20 2036 04/26/20 0550  NA 138 137 135  --  138  K 3.8 6.0* >7.5* 4.8 4.1  CL 101 104 102  --  98  CO2 24 22 14*  --  27   GLUCOSE 131* 110* 130*  --  102*  BUN 13 13 14   --  10  CREATININE 0.55 0.72 0.66  --  0.61  CALCIUM 8.3* 8.3* 8.6*  --  8.7*  PROT 5.1* 4.9*  --   --  5.3*  ALBUMIN 2.6* 2.5*  --   --  2.8*  AST 569* 989*  --   --  501*  ALT 313* 606*  --   --  608*  ALKPHOS 312* 386*  --   --  468*  BILITOT 1.1 1.4*  --   --  1.4*  GFRNONAA >60 >60 >60  --  >60  ANIONGAP 13 11 19*  --  13     Hematology Recent Labs  Lab 04/22/20 1342 04/23/20 0337 04/24/20 0114  WBC 13.2* 13.0* 12.5*  RBC 4.86 4.66 4.16  HGB 14.9 14.5 13.5  HCT 49.0* 47.7* 42.5  MCV 100.8* 102.4* 102.2*  MCH 30.7 31.1 32.5  MCHC 30.4 30.4 31.8  RDW 16.9* 17.2* 17.2*  PLT 342 319 292    BNPNo results for input(s): BNP,  PROBNP in the last 168 hours.   DDimer No results for input(s): DDIMER in the last 168 hours.      Radiology     Cardiac Studies   TEE 1/ 2022 IMPRESSIONS  1. Left ventricular ejection fraction, by estimation, is 20 to 25%. The  left ventricle has severely decreased function. The left ventricle  demonstrates global hypokinesis.  2. Right ventricular systolic function is moderately reduced. The right  ventricular size is normal.  3. Left atrial size was moderately dilated. No left atrial/left atrial  appendage thrombus was detected. The LAA emptying velocity was 30 cm/s.  4. Right atrial size was mildly dilated.  5. The mitral valve is degenerative. Mild to moderate mitral valve  regurgitation.  6. The aortic valve is tricuspid. There is mild calcification of the  aortic valve. There is mild thickening of the aortic valve. Aortic valve  regurgitation is trivial.  7. There is small, fibrinous stranding noted on the aortic valve leaflet  most consistent with a Lambl's excrescence.  8. A small PFO was detected by color flow Doppler with predominant L-->R  shunting, however, there is flow reversal (R-->L) with valsalva.  9. Agitated saline contrast bubble study was positive with  shunting  observed within 3-6 cardiac cycles with valsalva suggestive of interatrial  shunt.   Patient Profile     60 y.o. female with a hx of PAT, CAD, HTN, HLD, DM2, RA who is being seen today for the evaluation of palpitations and atrial tachycardia as well as abnormal coronary CTA at the request of Madelyn Flavors, MD.  Assessment & Plan    1.  CVA - right MCA/PCA, PCA, thalamus, right PLIC and CR scattered small/punctate infarcts: concerning for cardio-embolic source in the setting of cardiomyopathy with low EF  -TEE showed severe LV dysfunction with EF 20-25%, no LA or LAA thrombus but positive for PFO with bidirectional shunting with Valsalva -LE venous dopplers neg for DVT -Neuro feels PFO likely not associated with current CVA -she has had short bursf PAT but no PAF -no ILR at this time per EP -will get 30 day event monitor to assess for PAF at discharge -had been on ASA and Plavix but Neuro has recommended addition of Eliquis 5mg  BID.  Plavix stopped.  If EF improves and no afib in 3 months plan to then stop Eliquis and restart Plavix  2.  Paroxysmal atrial tachycardia  Again follow up with event monitor   3.  ASCAD -recent coronary CTA showed dRCA, moderate mLAD with 50-69% stenosis and mid Ramus with 50-69% stenosis as well as non dominant LCx with moderate lesions in OM1.  -FFRct shows multiple vessels with hemodynamically significant flow limiting stenosis. -cardiac cath was recommended but patient had not followed back up with her cardiologist -given acute CVA, not a candidate for cardiac cath at this time -plan to continue medical Rx for now.   -continue ASA  Start very low dose toprol XL  -statin on hold due to abnormla  LFTs  4.  HLD -LDL goal < 70 -LDL was 88 this admit -started on Crestor 20mg  daily a few weeks ago but now LFTs elevated -hold statin for now.   restart once LFTs normalize  5.  HTN BP is better   Try low dose toprol  6.  Ischemic DCM -EF  40-45% by recent echo but now down to 25-30% by echo this admit -ultimately pt needs  cardiac cath to define anatomy.  But,  given acute  CVA, not now -Will add low dose Toprol XL  7.  Hyperkalemia -K 4.1    8.  Transaminitis -LFts continues to trend up GI is following    Pt being transferred to rehab.   WIll follow up on Monday to reassess BP / HR  And volume status.     For questions or updates, please contact [facility] Please consult [email or web address] for contact info under        Signed, Dietrich Pates, MD  04/26/2020, 8:19 AM

## 2020-04-26 NOTE — Plan of Care (Signed)

## 2020-04-26 NOTE — Progress Notes (Signed)
Inpatient Rehabilitation Medication Review by a Pharmacist  A complete drug regimen review was completed for this patient to identify any potential clinically significant medication issues.  Clinically significant medication issues were identified:  yes   Type of Medication Issue Identified Description of Issue Urgent (address now) Non-Urgent (address on AM team rounds) Plan   Drug Interaction(s) (clinically significant)       Duplicate Therapy       Allergy       No Medication Administration End Date       Incorrect Dose       Additional Drug Therapy Needed  Per DC summary, resume metformin  Non-urgent Consider resuming metformin given good renal function   Other  Per DC summary, held rosuvastatin pending LFT improvement. No LFTs ordered Non-urgent Consider ordering LFTs twice weekly       For non-urgent medication issues to be resolved on team rounds tomorrow morning a  Handoff was sent to: AM Pharmacist    Time spent performing this drug regimen review (minutes):  15  Alphia Moh, PharmD, [place], [facility] Clinical Pharmacist  Please check AMION for all [facility] Pharmacy phone numbers After 10:00 PM, call Main Pharmacy [phone]

## 2020-04-26 NOTE — Progress Notes (Addendum)
Progress Note  Patient Name: Madison Hurst Date of Encounter: 04/26/2020  [facility] Cardiologist: Lavona Mound Tobb, DO   Subjective   No acute significant overnight events. No complaints this morning. No chest pain, shortness of breath, or palpitations.  Inpatient Medications    Scheduled Meds: . apixaban  5 mg Oral BID  . aspirin EC  81 mg Oral Daily  . buPROPion  150 mg Oral Daily  . DULoxetine  60 mg Oral Daily  . folic acid  2 mg Oral Daily  . insulin aspart  0-5 Units Subcutaneous QHS  . insulin aspart  0-9 Units Subcutaneous TID WC  . levothyroxine  75 mcg Oral QAC breakfast  . predniSONE  5 mg Oral Daily   Continuous Infusions:  PRN Meds: HYDROcodone-acetaminophen, LORazepam   Vital Signs    Vitals:   04/25/20 2049 04/25/20 2340 04/26/20 0336 04/26/20 0753  BP: 127/89 (!) 111/97 116/85 137/79  Pulse: 80 74 90 89  Resp: 18 18 18 18   Temp: 97.9 F (36.6 C) 97.8 F (36.6 C) 98.7 F (37.1 C) 98.6 F (37 C)  TempSrc: Oral Oral Oral Oral  SpO2: 98% 98% 99% 98%  Weight:      Height:        Intake/Output Summary (Last 24 hours) at 04/26/2020 1122 Last data filed at 04/26/2020 1100 Gross per 24 hour  Intake 480 ml  Output 2301 ml  Net -1821 ml   Last 3 Weights 04/23/2020 04/22/2020 03/13/2020  Weight (lbs) 128 lb 12 oz 123 lb 123 lb 3.2 oz  Weight (kg) 58.4 kg 55.792 kg 55.883 kg      Telemetry    Sinus rhythm with rates in the 80's to 90's. Continues to have some brief episodes of paroxysmal tachycardia.  - Personally Reviewed  ECG    Normal sinus rhythm, rate 89 bpm, with PACs and known LBBB. - Personally Reviewed  Physical Exam   GEN: No acute distress.   Neck: Supple. No JVD Cardiac: RRR. No murmurs, rubs, or gallops.  Respiratory: Clear to auscultation bilaterally. No wheezes, rhonchi, or rales. GI: Soft, non-distended, and non-tender. MS: No lower extremity edema. No deformity. Skin: Warm and dry.  Neuro:  No focal  deficits. Psych: Normal affect. Responds appropriately.  Labs    High Sensitivity Troponin:  No results for input(s): TROPONINIHS in the last 720 hours.    Chemistry Recent Labs  Lab 04/24/20 0114 04/25/20 0357 04/25/20 1710 04/25/20 2036 04/26/20 0550  NA 138 137 135  --  138  K 3.8 6.0* >7.5* 4.8 4.1  CL 101 104 102  --  98  CO2 24 22 14*  --  27  GLUCOSE 131* 110* 130*  --  102*  BUN 13 13 14   --  10  CREATININE 0.55 0.72 0.66  --  0.61  CALCIUM 8.3* 8.3* 8.6*  --  8.7*  PROT 5.1* 4.9*  --   --  5.3*  ALBUMIN 2.6* 2.5*  --   --  2.8*  AST 569* 989*  --   --  501*  ALT 313* 606*  --   --  608*  ALKPHOS 312* 386*  --   --  468*  BILITOT 1.1 1.4*  --   --  1.4*  GFRNONAA >60 >60 >60  --  >60  ANIONGAP 13 11 19*  --  13     Hematology Recent Labs  Lab 04/22/20 1342 04/23/20 0337 04/24/20 0114  WBC 13.2* 13.0*  12.5*  RBC 4.86 4.66 4.16  HGB 14.9 14.5 13.5  HCT 49.0* 47.7* 42.5  MCV 100.8* 102.4* 102.2*  MCH 30.7 31.1 32.5  MCHC 30.4 30.4 31.8  RDW 16.9* 17.2* 17.2*  PLT 342 319 292    BNPNo results for input(s): BNP, PROBNP in the last 168 hours.   DDimer No results for input(s): DDIMER in the last 168 hours.   Radiology    US Abdomen Limited RUQ (LIVER/GB)  Result Date: 04/24/2020 CLINICAL DATA:  Elevated liver enzymes EXAM: ULTRASOUND ABDOMEN LIMITED RIGHT UPPER QUADRANT COMPARISON:  None. FINDINGS: Gallbladder: Surgically absent. Common bile duct: Diameter: 5 mm. No intrahepatic or extrahepatic biliary duct dilatation. Liver: No focal lesion identified. Within normal limits in parenchymal echogenicity. Portal vein is patent on color Doppler imaging with normal direction of blood flow towards the liver. Other: None. IMPRESSION: Gallbladder absent.  Study otherwise unremarkable. Electronically Signed   By: Bretta Bang III M.D.   On: 04/24/2020 15:05    Cardiac Studies   TTE 04/23/2020: Impressions: 1. Left ventricular ejection fraction, by  estimation, is 20 to 25%. The  left ventricle has severely decreased function. The left ventricle  demonstrates global hypokinesis. Left ventricular diastolic parameters are  indeterminate.  2. Right ventricular systolic function is normal. The right ventricular  size is normal.  3. Left atrial size was moderately dilated.  4. The mitral valve is normal in structure. Mild mitral valve  regurgitation. No evidence of mitral stenosis.  5. The aortic valve is tricuspid. Aortic valve regurgitation is not  visualized. No aortic stenosis is present.  6. The inferior vena cava is normal in size with greater than 50%  respiratory variability, suggesting right atrial pressure of 3 mmHg.  _______________  TEE 04/24/2020: Impressions: 1. Left ventricular ejection fraction, by estimation, is 20 to 25%. The  left ventricle has severely decreased function. The left ventricle  demonstrates global hypokinesis.  2. Right ventricular systolic function is moderately reduced. The right  ventricular size is normal.  3. Left atrial size was moderately dilated. No left atrial/left atrial  appendage thrombus was detected. The LAA emptying velocity was 30 cm/s.  4. Right atrial size was mildly dilated.  5. The mitral valve is degenerative. Mild to moderate mitral valve  regurgitation.  6. The aortic valve is tricuspid. There is mild calcification of the  aortic valve. There is mild thickening of the aortic valve. Aortic valve  regurgitation is trivial.  7. There is small, fibrinous stranding noted on the aortic valve leaflet  most consistent with a Lambl's excrescence.  8. A small PFO was detected by color flow Doppler with predominant L-->R  shunting, however, there is flow reversal (R-->L) with valsalva.  9. Agitated saline contrast bubble study was positive with shunting  observed within 3-6 cardiac cycles with valsalva suggestive of interatrial  shunt.   Patient Profile     60 y.o.  female with a history of CAD, hypertension, hyperlipidemia, type 2 diabetes mellitus, rheumatoid arthritis who was admitted with acute CVA. Cardiology initially consulted for atrial tachycardia and recent abnormal coronary CTA.  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 - TEE showed LVEF of 20-25% with no left atrial appendage thrombus but positive for PFO with bidirectional shunting with Valsalva.  - Lower extremity dopplers negative for DVT. - Neuro feels PFO is not likely associated with current CVA. - She has history of paroxysmal atrial tachycardia but no atrial  fibrillation noted. Not a candidate for loop recorder given reduced EF per EP. Will plan for 30-day Event Monitor.  - She had been on DAPT with Aspirin and Plavix. However, Neuro recommends addition of Eliquis  twice daily so Plavix was stopped on 1/26. If EF improves and no atrial fibrillation in 3 months, plan will be to stop Eliquis and restart Plavix.   CAD Abnormal Coronary CTA - Recent coronary CTA on 04/11/2020 as outpatient showed coronary calcium score of 1,012 (99th percentile for age and sex) with multivessel CAD. FFR showed multiple vessels with hemodynamically significant/flow limiting stenosis. Cardiac catheterization was recommended. However, before this could be done he was admitted with acute stroke. - Not candidate for cardiac cath at this time given acute stroke.  - Continue to treat medically for now. Continue Aspirin.  - Beta-blocker was stopped yesterday due to soft BP. Will try to restart today. - Statin on hold due to abnormal LFTs. - Once she recovers from stroke, can have outpatient cardiac catheterization.  Ischemic Cardiomyopathy - Echo this admission showed LVEF of 20-25% with global hypokinesis and mild MR. EF down from 40-45%. - Appears euvolemic on exam.  - Irbesartan and Toprol stopped on 1/27 due to soft BP. BP better today so can try to  restart Toprol-XL 12.5mg  daily.. - Hyperkalemic noted yesterday (potassium 6, >7.5) but it looks like this may have been an error due to hemolysis. Potassium 4.1 today. Can try to restart ARB if BP tolerates addition of Toprol; however, would monitor for hyperkalemia closely. - Continue to monitor volume status closely.   Paroxysmal Atrial Tachycardia - Continue to have brief episodes of PAT but no discernable atrial fibrillation. - Can try to restart Toprol now that BP improved today.  Hypertension - History of hypertension by BP dropped yesterday morning (systolic BP in the 80's) so ARB and beta-blocker were held. - BP better this morning.  - Would try to restart beta-blocker as above.  Hyperlipdiemia - Lipid panel this admission: Total Cholesterol 159, Triglycerides 111, HDL 49, LDL 88. - LDL goal <70 given CAD and stroke. - Statin currently on hold due to acute transaminitis.   Otherwise, per primary team. - Type 2 diabetes - Hypothyroidism - Anxiety/depression - Rheumatoid arthritis  - Acute transaminitis   For questions or updates, please contact [facility] Please consult [email or web address] for contact info under      Signed, Corrin Parker, PA-C  04/26/2020, 11:22 AM    Pt seen and examined   See accompanying note as well.  Dietrich Pates MD

## 2020-04-26 NOTE — Progress Notes (Addendum)
Daily Rounding Note  04/26/2020, 8:39 AM  LOS: 4 days   SUBJECTIVE:   Chief complaint: Elevated LFTs    Patient is feeling quite well today.  She is eating her breakfast.  No nausea, no abdominal pain, no itching.  OBJECTIVE:         Vital signs in last 24 hours:    Temp:  [97.3 F (36.3 C)-98.7 F (37.1 C)] 98.6 F (37 C) (01/28 0753) Pulse Rate:  [74-90] 89 (01/28 0753) Resp:  [18-20] 18 (01/28 0753) BP: (102-137)/(79-97) 137/79 (01/28 0753) SpO2:  [98 %-100 %] 98 % (01/28 0753) Last BM Date:  (PTA) Filed Weights   04/22/20 1258 04/23/20 1552  Weight: 55.8 kg 58.4 kg   General: Looks somewhat chronically ill but alert, calm, comfortable Heart: RRR Chest: No labored breathing Abdomen: Soft, nontender, nondistended.  Active bowel sounds Extremities: No CCE Neuro/Psych: Weakness in the left upper extremity.  No tremors or involuntary movement.  Appropriate.  Intake/Output from previous day: 01/27 0701 - 01/28 0700 In: -  Out: 2050 [Urine:2050]  Intake/Output this shift: Total I/O In: -  Out: 1 [Urine:1]  Lab Results: Recent Labs    04/24/20 0114  WBC 12.5*  HGB 13.5  HCT 42.5  PLT 292   BMET Recent Labs    04/25/20 0357 04/25/20 1710 04/25/20 2036 04/26/20 0550  NA 137 135  --  138  K 6.0* >7.5* 4.8 4.1  CL 104 102  --  98  CO2 22 14*  --  27  GLUCOSE 110* 130*  --  102*  BUN 13 14  --  10  CREATININE 0.72 0.66  --  0.61  CALCIUM 8.3* 8.6*  --  8.7*   LFT Recent Labs    04/24/20 0114 04/25/20 0357 04/26/20 0550  PROT 5.1* 4.9* 5.3*  ALBUMIN 2.6* 2.5* 2.8*  AST 569* 989* 501*  ALT 313* 606* 608*  ALKPHOS 312* 386* 468*  BILITOT 1.1 1.4* 1.4*   PT/INR Recent Labs    04/26/20 0550  LABPROT 21.7*  INR 2.0*   Hepatitis Panel No results for input(s): HEPBSAG, HCVAB, HEPAIGM, HEPBIGM in the last 72 hours.  Studies/Results: ECHO TEE  Result Date: 04/24/2020     TRANSESOPHOGEAL ECHO REPORT   Patient Name:   Madison Hurst Date of Exam: 04/24/2020 Medical Rec #:  [id number]                     Height:       60.0 in Accession #:    [id number]                    Weight:       128.7 lb Date of Birth:  1961/01/14                    BSA:          1.548 m Patient Age:    60 years                      BP:           135/98 mmHg Patient Gender: F                             HR:           90 bpm. Exam Location:  Inpatient Procedure: Transesophageal Echo, 3D Echo, Color Doppler and Cardiac Doppler Indications:     Stroke i163.9  History:         Patient has prior history of Echocardiogram examinations, most                  recent 04/23/2020. Risk Factors:Hypertension and Diabetes.  Sonographer:     Raquel Sarna Senior RDCS Referring Phys:  [id number] Greer Ee [place] Diagnosing Phys: Gwyndolyn Kaufman MD PROCEDURE: After discussion of the risks and benefits of a TEE, an informed consent was obtained from the patient. The transesophogeal probe was passed without difficulty through the esophogus of the patient. Local oropharyngeal anesthetic was provided with Cetacaine. Sedation performed by different physician. The patient was monitored while under deep sedation. Anesthestetic sedation was provided intravenously by Anesthesiology: 79m of Propofol. The patient developed no complications during the procedure. IMPRESSIONS  1. Left ventricular ejection fraction, by estimation, is 20 to 25%. The left ventricle has severely decreased function. The left ventricle demonstrates global hypokinesis.  2. Right ventricular systolic function is moderately reduced. The right ventricular size is normal.  3. Left atrial size was moderately dilated. No left atrial/left atrial appendage thrombus was detected. The LAA emptying velocity was 30 cm/s.  4. Right atrial size was mildly dilated.  5. The mitral valve is degenerative. Mild to moderate mitral valve regurgitation.  6. The aortic valve is  tricuspid. There is mild calcification of the aortic valve. There is mild thickening of the aortic valve. Aortic valve regurgitation is trivial.  7. 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  8. A small PFO was detected by color flow Doppler with predominant L-->R shunting, however, there is flow reversal (R-->L) with valsalva.  9. Agitated saline contrast bubble study was positive with shunting observed within 3-6 cardiac cycles with valsalva suggestive of interatrial shunt. FINDINGS  Left Ventricle: Left ventricular ejection fraction, by estimation, is 20 to 25%. The left ventricle has severely decreased function. The left ventricle demonstrates global hypokinesis. The left ventricular internal cavity size was normal in size. Right Ventricle: The right ventricular size is normal. No increase in right ventricular wall thickness. Right ventricular systolic function is moderately reduced. Left Atrium: Left atrial size was moderately dilated. No left atrial/left atrial appendage thrombus was detected. The LAA emptying velocity was 30 cm/s. Right Atrium: Right atrial size was mildly dilated. Pericardium: There is no evidence of pericardial effusion. Mitral Valve: The mitral valve is degenerative in appearance. There is mild thickening of the mitral valve leaflet(s). There is mild calcification of the mitral valve leaflet(s). Mild to moderate mitral valve regurgitation. Tricuspid Valve: The tricuspid valve is normal in structure. Tricuspid valve regurgitation is mild. Aortic Valve: The aortic valve is tricuspid. There is mild calcification of the aortic valve. There is mild thickening of the aortic valve. Aortic valve regurgitation is trivial. 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 Pulmonic Valve: The pulmonic valve was normal in structure. Pulmonic valve regurgitation is  trivial. Aorta: The aortic root and ascending aorta are structurally normal, with no evidence of dilitation. IAS/Shunts: Evidence of atrial level shunting detected by color flow Doppler. Agitated saline contrast was given intravenously to evaluate for intracardiac shunting. Agitated saline contrast bubble study was positive with shunting observed within 3-6 cardiac cycles suggestive of interatrial shunt. A small patent foramen ovale is detected. HGwyndolyn KaufmanMD Electronically signed by  Gwyndolyn Kaufman MD Signature Date/Time: 04/24/2020/9:38:38 AM    Final    US Abdomen Limited RUQ (LIVER/GB)  Result Date: 04/24/2020 CLINICAL DATA:  Elevated liver enzymes EXAM: ULTRASOUND ABDOMEN LIMITED RIGHT UPPER QUADRANT COMPARISON:  None. FINDINGS: Gallbladder: Surgically absent. Common bile duct: Diameter: 5 mm. No intrahepatic or extrahepatic biliary duct dilatation. Liver: No focal lesion identified. Within normal limits in parenchymal echogenicity. Portal vein is patent on color Doppler imaging with normal direction of blood flow towards the liver. Other: None. IMPRESSION: Gallbladder absent.  Study otherwise unremarkable. Electronically Signed   By: Lowella Grip III M.D.   On: 04/24/2020 15:05    ASSESMENT:   *    Rising LFTs with normal liver parenchyma and biliary tree per ultrasound. Crestor discontinued 3 d ago.   Acute hepatitis A/B/C serologies nonreactive. ANA, smooth muscle antibody, microsomal Ab, TTG pndg. Ceruloplasmin 43 elevated (norm: 19 - 39): rules out Wilson's dz.   T bili stable, alk phos rising, AST/ALT improved.    *    Cardioembolic CVA.  Eliquis day 3.  2 doses of Plavix, now discontinued.  On low-dose aspirin.  *   CHF.  LVEF has dropped from 40-45 to 20 to 25% over course of 6 weeks.    *   Suspected CAD, cardiac cath on hold due to stroke.  *     Rheumatoid arthritis, chronic low-dose prednisone and Orencia.  *   [place] anemia.  Chronic anemia.  Previous outpt  Feraheme infusions per heme in [place].    PLAN   *     Continue current course.  Recheck LFTs in a day or 2. Weight pending labs.   Madison Hurst  04/26/2020, 8:39 AM Phone [phone]  GI ATTENDING  Interval history data reviewed.  Patient seen and examined.  Agree with her progress note as outlined above.  LFTs about the same.  No evidence for liver failure.  Initial clinical suspicions were toxic exposure (drug) versus acute congestion (cardiac).  Autoimmune studies pending. Agree with ongoing care of acute medical problems.  Continue to trend liver tests and follow-up on autoimmune studies.  GI will see on Monday.  Dr. Benson Norway on call this weekend for GI if interval help needed.  Thanks  Docia Chuck. Geri [place]in[place]., M.D. [facility] Division of Gastroenterology

## 2020-04-26 NOTE — Progress Notes (Signed)
Inpatient Rehabilitation-Admissions Coordinator   Received insurance approval for admit to CIR today. Notified pt and her husband of bed offer and they have accepted. Reviewed insurance benefit letter and consent forms. All questions answered at this time. Received medical clearance from Dr. [place] for admit to CIR today. RN and [facility] team aware of plan for today.   Cheri Rous, OTR/L  Rehab Admissions Coordinator  [phone] 04/26/2020 3:08 PM

## 2020-04-26 NOTE — Discharge Summary (Signed)
Physician Discharge Summary  Madison Hurst YFV:[id number] DOB: 1960-10-15 DOA: 04/22/2020  PCP: Madison Grammes, MD  Admit date: 04/22/2020 Discharge date: 04/26/2020  Admitted From: Home  Disposition: CIR   Recommendations for Outpatient Follow-up:  1. Follow up with PCP in 1-2 weeks 2. Follow-up with cardiology 3. Follow-up with gastroenterology 4. Continue to trend LFTs to ensure improving 5. Please follow up on the following pending results:  Discharge Condition: Stable CODE STATUS: Full code Diet recommendation: Heart healthy/consistent carbohydrate diet  History of present illness:  Madison Hurst is a 60 year old female with past medical history significant for paroxysmal atrial fibrillation, CAD, essential hypertension, hyperlipidemia, type 2 diabetes mellitus, rheumatoid arthritis, anxiety, depression who presented to the ED with progressive weakness on the left side with rapid progression over the last week.  Patient reports over the last 2 days, falls on 3 occasions.  Patient states when she fell today, she hit her head slightly.  Patient also with weakness feeling like she is going to pass out, but not as loss of consciousness or syncopal event.  Patient also reports associated headaches that are intermittent with associated sweats; in which these have been present for roughly 1 year.  In the ED, afebrile, HR 125, RR 14, BP 125/102, SPO2 100% on room air.  Sodium 137, potassium 3.7, chloride 98, CO2 28, glucose 173, BUN 15, creatinine 0.59, AST 65, ALT 62. CK 87.  WBC 13.2, hemoglobin 14.9, platelets 342.  INR 1.3.  TSH 0.740.  SARS-CoV-2 negative.  EtOH level less than 10.  Urinalysis with moderate leukocytes, negative nitrite, rare bacteria, 11-20 WBCs.  UDS positive for opiates.  CT head without contrast with no acute intracranial abnormality.  MRI head positive for acute infarction consistent with posterior circulation embolic disease without  signs of hemorrhage.  Patient was given 1 L NS bolus, 5 mg of metoprolol IV and 3 and 24 mg of aspirin.  Neurology was consulted.  Hospitalist service consulted for further evaluation and management of acute CVA.  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  MR brain notable for acute infarct consistent with posterior circulation embolic disease.  CTA head/neck with no large vessel occlusion.  Total cholesterol 159, HDL 49, LDL 88.  ESR 3.  TSH 0.740.  Hemoglobin A1c 6.1.  TTE with LVEF 20-25% (40-45% Dec 2021), LV global hypokinesis, RV systolic function within normal limits, LA moderately dilated, IVC normal in size.  TEE 1/26 with LVEF 20-25 percent, with global hypokinesis and PFO with left to right shunt; but neurology believes PFO not etiology of her acute stroke.  Patient was started on Eliquis 5 mg p.o. twice daily for concern over embolic event likely related to her cardiomyopathy.  Continue aspirin 81 mg p.o. daily for underlying CAD.  Electrophysiology plans to place event monitor.  Discharging to CIR today for further rehabilitation.  With [facility]/stroke clinic in 4 weeks.  Paroxysmal atrial tachycardia/sinus tachycardia On admission, patient notable with elevated heart rates in the 120s.  Patient also reports she had an abnormal coronary CT in which she is following with cardiology, Dr. Harriet Masson outpatient.  Metoprolol succinate 12.5 mg twice daily.  Cardiology to place event monitor.  Outpatient follow-up with cardiology.  Patent foramen ovale TEE 1/26 notable for PFO with left-to-right shunt, although neurology believes this is not related to her embolic event.  Ischemic cardiomyopathy Chronic  systolic congestive heart failure TTE with LVEF 20-25% (was 40-45% Dec 2021), with LV global hypokinesis.  Will need further ischemic  work-up with LHC; deferred acutely by cardiology due to her acute stroke; and plan outpatient follow-up.  Continue aspirin 81 mg p.o. daily.  Metoprolol succinate 12.5 mg p.o. daily.  ARB/Entresto held due to borderline hypotension.  Will need repeat TTE in 3 months.  Holding statin due to elevated LFTs.  Transaminitis: Acute.  R factor equal to 2.87, consistent with mixed hepatocellular/cholestatic injury pattern. RUQ U/S with no focal lesions, normal appearance of parenchymal echogenicity, portal vein patent, gallbladder absent and otherwise unremarkable. Unclear etiology; question insult from acute CVA or decompensated CHF vs DILI vs autoimmune; no obstruction appreciated on U/S. [facility] consulted and followed during the hospital course.  Acute hepatitis panel negative.  Ceruloplasmin elevated which rules out Wilson's disease and likely elevated secondary to history of rheumatoid arthritis.  LFTs starting to improve. ANA, AsMA, antimicrosomal ab Liver/kidney, tissue transglutaminase IgA pending at time of discharge.  Holding statin until LFTs normalize.  Essential hypertension Home regimen includes metoprolol 12.5 mg daily and telmisartan 20 mg daily. ARB and beta-blocker on hold due to hypotension per cardiology  Rheumatoid arthritis:  Patient with swelling of the bilateral hands that she states is chronic due to rheumatoid arthritis. Home medication regimen includes prednisone 5 mg daily, Orencia, flurbiprofen 100 mgtwice daily. Continueprednisone. Holding home flurbiprofendue to risk of thrombotic event.  Recommend outpatient follow-up with rheumatology prior to restarting home medications other than prednisone.  Diabetes mellitus type 2:  On admission glucose elevated up to 173. Home medications include Metformin 519m daily.  Hemoglobin A1c 6.1, well controlled.  Resume home Metformin.  Anxiety and depression Continuehome Wellbutrin 150 mg daily and Cymbalta 60 mg  daily  Hyperlipidemia: Lipid panel with total cholesterol 159, HDL 49, LDL 88.Holding home Crestor due to elevated LFTs  Hypothyroidism:  TSH 0.74, Free T4 1.88, Free T3 2.1. Continue home levothyroxine 75 mcg daily.  Hypokalemia: resolved Hypomagnesemia: resolved Repleted during hospitalization.  Discharge Diagnoses:  Principal Problem:   CVA (cerebral vascular accident) ([facility] Active Problems:   Anxiety   Hypothyroidism   Seropositive rheumatoid arthritis ([facility]   Type 2 diabetes mellitus (HCC)   PAT (paroxysmal atrial tachycardia) (HCC)   Essential hypertension   Elevated liver function tests   Patent foramen ovale   Acute on chronic combined systolic and diastolic CHF (congestive heart failure) ([facility]    Discharge Instructions  Discharge Instructions    Ambulatory referral to Neurology   Complete by: As directed    Follow up with stroke clinic NP (Jessica Vanschaick or CCecille Rubin if both not available, consider S[facility]achery Dauer [facility] Thanks.   Diet - low sodium heart healthy   Complete by: As directed    Increase activity slowly   Complete by: As directed      Allergies as of 04/26/2020      Reactions   Sulfa Antibiotics    Sulfamethoxazole-trimethoprim Rash      Medication List    STOP taking these medications   flurbiprofen 100 MG tablet Commonly known as: ANSAID   Orencia ClickJect 1161MG/ML Soaj Generic drug: Abatacept   ORENCIA [place]   rosuvastatin 20 MG tablet Commonly known as: CRESTOR   telmisartan 20 MG tablet Commonly known as: MICARDIS     TAKE these medications   apixaban 5 MG Tabs tablet Commonly known as: ELIQUIS Take 1 tablet (  5 mg total) by mouth 2 (two) times daily.   aspirin EC 81 MG tablet Take 1 tablet (81 mg total) by mouth daily. Swallow whole.   buPROPion 150 MG 24 hr tablet Commonly known as: WELLBUTRIN XL Take 150 mg by mouth daily.   DULoxetine 60 MG capsule Commonly known as:  CYMBALTA 60 mg.   ergocalciferol 1.25 MG (50000 UT) capsule Commonly known as: VITAMIN D2 Take 50,000 Units by mouth once a week.   folic acid 1 MG tablet Commonly known as: FOLVITE Take 2 mg by mouth daily.   HYDROcodone-acetaminophen 5-325 MG tablet Commonly known as: NORCO/VICODIN Take 1 tablet by mouth every 6 (six) hours as needed for moderate pain.   levothyroxine 75 MCG tablet Commonly known as: SYNTHROID Take 75 mcg by mouth daily before breakfast.   metFORMIN 500 MG (MOD) 24 hr tablet Commonly known as: GLUMETZA Take 500 mg by mouth daily with breakfast.   metoprolol succinate 25 MG 24 hr tablet Commonly known as: TOPROL-XL Take 0.5 tablets (12.5 mg total) by mouth daily. Start taking on: April 27, 2020   multivitamin capsule Take 1 capsule by mouth daily.   predniSONE 5 MG tablet Commonly known as: DELTASONE Take 5 mg by mouth daily.       Follow-up Information    [facility]. Schedule an appointment as soon as possible for a visit in 4 week(s).   Specialty: Neurology Contact information: [place] [phone]             Allergies  Allergen Reactions  . Sulfa Antibiotics   . Sulfamethoxazole-Trimethoprim Rash    Consultations:  Cardiology  Neurology  [facility]   Procedures/Studies: CT Code Stroke CTA Head W/WO contrast  Result Date: 04/22/2020 CLINICAL DATA:  Worsening left-sided weakness for 2 weeks. Recent TIA. Fall today. Slurred speech. EXAM: CT ANGIOGRAPHY HEAD AND NECK TECHNIQUE: Multidetector CT imaging of the head and neck was performed using the standard protocol during bolus administration of intravenous contrast. Multiplanar CT image reconstructions and MIPs were obtained to evaluate the vascular anatomy. Carotid stenosis measurements (when applicable) are obtained utilizing NASCET criteria, using the distal internal carotid diameter as the denominator.  CONTRAST:  40m OMNIPAQUE IOHEXOL 350 MG/ML SOLN COMPARISON:  Brain MRI 04/22/2020 Head CT 04/22/2020 FINDINGS: CTA NECK FINDINGS SKELETON: There is no bony spinal canal stenosis. No lytic or blastic lesion. OTHER NECK: Normal pharynx, larynx and major salivary glands. No cervical lymphadenopathy. Enlarged thyroid gland. UPPER CHEST: Small left pleural effusion with fluid in the fissure. AORTIC ARCH: There is no calcific atherosclerosis of the aortic arch. There is no aneurysm, dissection or hemodynamically significant stenosis of the visualized portion of the aorta. Conventional 3 vessel aortic branching pattern. The visualized proximal subclavian arteries are widely patent. RIGHT CAROTID SYSTEM: Normal without aneurysm, dissection or stenosis. LEFT CAROTID SYSTEM: No dissection, occlusion or aneurysm. Mild atherosclerotic calcification at the carotid bifurcation without hemodynamically significant stenosis. VERTEBRAL ARTERIES: Codominant configuration. Both origins are clearly patent. There is no dissection, occlusion or flow-limiting stenosis to the skull base (V1-V3 segments). CTA HEAD FINDINGS POSTERIOR CIRCULATION: --Vertebral arteries: Normal V4 segments. --Inferior cerebellar arteries: Normal. --Basilar artery: Normal. --Superior cerebellar arteries: Normal. --Posterior cerebral arteries (PCA): Severe stenosis of the right PCA P3 segment over a length 7 mm (12:17). The distal right PCA is normal. Mild stenosis of the left P3 segment in a similar location (12:24). ANTERIOR CIRCULATION: --Intracranial internal carotid arteries: Normal. --Anterior cerebral arteries (  ACA): Normal. Hypoplastic right A1 segment, normal variant. --Middle cerebral arteries (MCA): Normal. VENOUS SINUSES: As permitted by contrast timing, patent. Incidentally noted gas in the cavernous sinuses related to contrast injection. ANATOMIC VARIANTS: None Review of the MIP images confirms the above findings. IMPRESSION: 1. No emergent large  vessel occlusion. 2. 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 3. Mild stenosis of the left PCA P3 segment. 4. Small left pleural effusion with fluid in the fissure. 5. Enlarged thyroid gland. This has been evaluated on previous imaging. (ref: J Am Coll Radiol. 2015 Feb;12(2): 143-50). Electronically Signed   By: Ulyses Jarred M.D.   On: 04/22/2020 20:14   CT Head Wo Contrast  Result Date: 04/22/2020 CLINICAL DATA:  Tube deficit. Progressive weakness, greater on the left. Fell today. EXAM: CT HEAD WITHOUT CONTRAST TECHNIQUE: Contiguous axial images were obtained from the base of the skull through the vertex without intravenous contrast. COMPARISON:  02/16/2020 FINDINGS: Brain: No evidence of acute infarction, hemorrhage, hydrocephalus, extra-axial collection or mass lesion/mass effect. Subcortical and periventricular white matter hypodensity most likely result of chronic ischemic small vessel disease. Similar findings can be seen with demyelinating disease of the white matter. Diffuse cortical atrophy again seen. Vascular: No hyperdense vessel or unexpected calcification. Skull: Normal. Negative for fracture or focal lesion. Sinuses/Orbits: No acute finding. Other: None. IMPRESSION: No acute intracranial abnormality. Electronically Signed   By: Miachel Roux M.D.   On: 04/22/2020 14:29   CT Code Stroke CTA Neck W/WO contrast  Result Date: 04/22/2020 CLINICAL DATA:  Worsening left-sided weakness for 2 weeks. Recent TIA. Fall today. Slurred speech. EXAM: CT ANGIOGRAPHY HEAD AND NECK TECHNIQUE: Multidetector CT imaging of the head and neck was performed using the standard protocol during bolus administration of intravenous contrast. Multiplanar CT image reconstructions and MIPs were obtained to evaluate the vascular anatomy. Carotid stenosis measurements (when applicable) are obtained utilizing NASCET criteria, using the distal internal carotid diameter as the denominator. CONTRAST:   41m OMNIPAQUE IOHEXOL 350 MG/ML SOLN COMPARISON:  Brain MRI 04/22/2020 Head CT 04/22/2020 FINDINGS: CTA NECK FINDINGS SKELETON: There is no bony spinal canal stenosis. No lytic or blastic lesion. OTHER NECK: Normal pharynx, larynx and major salivary glands. No cervical lymphadenopathy. Enlarged thyroid gland. UPPER CHEST: Small left pleural effusion with fluid in the fissure. AORTIC ARCH: There is no calcific atherosclerosis of the aortic arch. There is no aneurysm, dissection or hemodynamically significant stenosis of the visualized portion of the aorta. Conventional 3 vessel aortic branching pattern. The visualized proximal subclavian arteries are widely patent. RIGHT CAROTID SYSTEM: Normal without aneurysm, dissection or stenosis. LEFT CAROTID SYSTEM: No dissection, occlusion or aneurysm. Mild atherosclerotic calcification at the carotid bifurcation without hemodynamically significant stenosis. VERTEBRAL ARTERIES: Codominant configuration. Both origins are clearly patent. There is no dissection, occlusion or flow-limiting stenosis to the skull base (V1-V3 segments). CTA HEAD FINDINGS POSTERIOR CIRCULATION: --Vertebral arteries: Normal V4 segments. --Inferior cerebellar arteries: Normal. --Basilar artery: Normal. --Superior cerebellar arteries: Normal. --Posterior cerebral arteries (PCA): Severe stenosis of the right PCA P3 segment over a length 7 mm (12:17). The distal right PCA is normal. Mild stenosis of the left P3 segment in a similar location (12:24). ANTERIOR CIRCULATION: --Intracranial internal carotid arteries: Normal. --Anterior cerebral arteries (ACA): Normal. Hypoplastic right A1 segment, normal variant. --Middle cerebral arteries (MCA): Normal. VENOUS SINUSES: As permitted by contrast timing, patent. Incidentally noted gas in the cavernous sinuses related to contrast injection. ANATOMIC VARIANTS: None Review of the MIP  images confirms the above findings. IMPRESSION: 1. No emergent large vessel  occlusion. 2. 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 3. Mild stenosis of the left PCA P3 segment. 4. Small left pleural effusion with fluid in the fissure. 5. Enlarged thyroid gland. This has been evaluated on previous imaging. (ref: J Am Coll Radiol. 2015 Feb;12(2): 143-50). Electronically Signed   By: Ulyses Jarred M.D.   On: 04/22/2020 20:14   MR BRAIN WO CONTRAST  Result Date: 04/22/2020 CLINICAL DATA:  Acute neurological deficit. Progressive weakness, worse on the left. EXAM: MRI HEAD WITHOUT CONTRAST TECHNIQUE: Multiplanar, multiecho pulse sequences of the brain and surrounding structures were obtained without intravenous contrast. COMPARISON:  Head CT same day.  MRI 02/28/2020. FINDINGS: Brain: Diffusion imaging shows acute infarction affecting the right mid-posterior caudate an the superior thalamus. Single separate punctate acute infarction in the cortex of the medial right parietooccipital junction. Few small punctate acute infarctions in the right occipital cortex. 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 Elsewhere, there is chronic small vessel ischemic change affecting the hemispheric deep and subcortical white matter, right more than left. No focal insult affects the brainstem or cerebellum. No evidence of mass, hemorrhage, hydrocephalus or extra-axial collection Vascular: Major vessels at the base of the brain show flow. Skull and upper cervical spine: Negative Sinuses/Orbits: Clear/normal Other: None IMPRESSION: 1. Acute infarction affecting the right mid-posterior caudate and right superior thalamus. Single separate punctate acute infarction in the cortex of the medial right parietooccipital junction. Few small punctate acute infarctions in the right occipital cortex. 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 No evidence of hemorrhage or mass effect. 2. Chronic small-vessel ischemic changes elsewhere throughout the brain as  outlined above. Electronically Signed   By: Nelson Chimes M.D.   On: 04/22/2020 17:12   MR CERVICAL SPINE WO CONTRAST  Result Date: 04/23/2020 CLINICAL DATA:  Myelopathy, acute or progressive. Additional history provided: Patient reports worsening weakness on the left side, most notably within the last week, 3 falls in the last 2 days weakness all over, but the left-sided seems weaker than the right. EXAM: MRI CERVICAL SPINE WITHOUT CONTRAST TECHNIQUE: Multiplanar, multisequence MR imaging of the cervical spine was performed. No intravenous contrast was administered. COMPARISON:  CT head/neck 04/22/2020.  CT cervical spine 01/26/2015. FINDINGS: All sequences are moderate to severely motion degraded, significantly limiting evaluation. Alignment: Straightening of the expected cervical lordosis. No significant spondylolisthesis. Vertebrae: No significant marrow edema or focal suspicious osseous Lea is identified. Cord: Motion degradation precludes adequate evaluation for spinal cord signal abnormality. Posterior Fossa, vertebral arteries, paraspinal tissues: Cerebellar atrophy. No acute abnormality identified within included portions of the posterior fossa. Cervical vertebral artery flow voids poorly assessed due to motion degradation. No paraspinal soft tissue abnormality is identified. Disc levels: Multilevel disc degeneration. Most notably, there is moderate disc degeneration at the C4-C5, C5-C6 and C6-C7 levels. C2-C3: Disc bulge. Facet hypertrophy. No significant spinal canal stenosis. Mild bilateral neural foraminal narrowing. C3-C4: Disc bulge. Bilateral disc osteophyte ridge/uncinate hypertrophy (greater on the right). Facet and ligamentum flavum hypertrophy. Apparent moderate spinal canal stenosis. Bilateral neural foraminal narrowing (severe right, mild left). C4-C5: Posterior disc osteophyte complex. Uncovertebral, facet and ligamentum flavum hypertrophy. Apparent moderate/severe spinal canal stenosis.  Bilateral neural foraminal narrowing (moderate/severe right, moderate left). C5-C6: Posterior disc osteophyte complex. Uncovertebral, facet and ligamentum flavum hypertrophy. Apparent moderate/severe spinal canal stenosis. Bilateral neural foraminal narrowing (moderate right, moderate/severe left). C6-C7: Posterior disc osteophyte complex.  Uncovertebral, facet and ligamentum flavum hypertrophy. Apparent moderate spinal canal stenosis. Bilateral neural foraminal narrowing (moderate/severe right, moderate left). C7-T1: Mild endplate spurring on the left. Mild facet hypertrophy (greater on the left). No appreciable disc herniation or spinal canal stenosis. Mild left neural foraminal narrowing. IMPRESSION: All acquired sequences are moderate to severely motion degraded, significantly limiting evaluation. A repeat examination should be considered when the patient is better able to tolerate the study. Cervical spondylosis as outlined. Most notably, there is apparent moderate/severe spinal canal stenosis at C4-C5 and C5-C6, and apparent moderate spinal canal stenosis at C3-C4 and C6-C7. Motion degradation limits evaluation for subtle spinal cord mass effect and precludes adequate evaluation for spinal cord signal abnormality. Multilevel neural foraminal narrowing as detailed and greatest on the right at C3-C4 (severe), on the right at C4-C5 (moderate/severe), on the left at C5-C6 (moderate/severe) and on the right at C6-C7 (moderate/severe). Electronically Signed   By: Kellie Simmering DO   On: 04/23/2020 12:27   CT CORONARY MORPH W/CTA COR W/SCORE W/CA W/CM &/OR WO/CM  Addendum Date: 04/12/2020   ADDENDUM REPORT: 04/12/2020 13:02 CLINICAL DATA:  60 year old female with chest pain and depressed ejection fraction. EXAM: Cardiac/Coronary  CT TECHNIQUE: The patient was scanned on a [employer]. FINDINGS: A 120 kV retrospective scan was triggered in the descending thoracic aorta at 111 HU's. Axial non-contrast 3 mm  slices were carried out through the heart. The data set was analyzed on a dedicated work station and scored using the Melvin. Gantry rotation speed was 250 msecs and collimation was .6 mm. No beta blockade and 0.8 mg of sl NTG was given. The 3D data set was reconstructed in 5% intervals of the 67-82 % of the R-R cycle. Diastolic phases were analyzed on a dedicated work station using MPR, MIP and VRT modes. The patient received 80 cc of contrast. Aorta: Normal size.  No calcifications.  No dissection. Aortic Valve:  Trileaflet.  No calcifications. Coronary calcium score: 1012 Coronary Arteries:  Normal coronary origin.  Right dominance. RCA is a large dominant artery that gives rise to PDA and PLVB. There is a Proximal mild (25-49%) calcified plaque. The mid RDA with a soft plaque with high risk appearance (napkin ring). The mid to distal RCA with diffuse mild mixed lesions. The distal RCA with a severe (>70) soft plaque. Left main is a large artery that gives rise to LAD, Ramus Intermedius and LCX arteries. LAD is a large vessel. The proximal LAD with no plaque. The mid LAD with a moderate (50-69%) mixed lesion that as it terminates appears to look >70%. Ramus is a medium sized vessel. There is no plaques in the proximal portion. The mid Ramus with moderate (50-69%) lesion. LCX is a non-dominant artery that gives rise to one large OM1 branch. There is proximal to mid segmental moderate lesions. Other findings: Normal pulmonary vein drainage into the left atrium. Normal left atrial appendage without a thrombus. Normal size of the pulmonary artery. IMPRESSION: 1. Coronary calcium score of 1012. This was 82 percentile for age and sex matched control. 2. Normal coronary origin with right dominance. 3. Multivessel CAD. CADRADS 4A. This study will be send for FFRct. Consider cardiac catheterization. Berniece Salines, DO Electronically Signed   By: Berniece Salines DO   On: 04/12/2020 13:02   Result Date: 04/12/2020 EXAM:  OVER-READ INTERPRETATION CT CHEST The following report is an over-read performed by radiologist Dr. Evangeline Dakin of [facility], [place] on 04/11/2020. This  over-read does not include interpretation of cardiac or coronary anatomy or pathology. The coronary calcium score/coronary CTA interpretation by the cardiologist is attached. COMPARISON:  None. FINDINGS: Vascular: Moderate atherosclerosis involving the descending thoracic aorta with calcified and noncalcified plaque. Reflux of contrast from the RIGHT atrium into the IVC and hepatic veins. Mediastinum/Nodes: Scarring in the anterior superior mediastinum at the site of prior mediastinal mass excision. No pathologic lymphadenopathy within the visualized mediastinum. Visualized esophagus normal in appearance. Lungs/Pleura: Mild diffuse interstitial opacities throughout both lungs likely indicating mild pulmonary edema. Chronic scarring in the lower lobes. Small air cyst in the superior segment RIGHT LOWER LOBE, unchanged. Small to moderate-sized BILATERAL pleural effusions, including fluid in the major fissure on the LEFT. Upper Abdomen: Unremarkable for the early arterial phase of enhancement. Musculoskeletal: Severe degenerative disc disease and spondylosis involving the visualized mid and lower thoracic spine. IMPRESSION: 1. Mild diffuse interstitial opacities throughout both lungs, likely indicating mild pulmonary edema. 2. Small to moderate-sized BILATERAL pleural effusions, including fluid in the major fissure on the LEFT. 3. Reflux of contrast from the RIGHT atrium into the IVC and hepatic veins, likely indicating elevated RIGHT heart pressures and/or tricuspid regurgitation. 4. 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 Aortic Atherosclerosis (ICD10-I70.0). Electronically Signed: By: Evangeline Dakin M.D. On: 04/11/2020 15:42   DG Chest Port 1 View  Result Date: 04/22/2020 CLINICAL DATA:   Weakness EXAM: PORTABLE CHEST 1 VIEW COMPARISON:  Portable exam 1345 hours compared to 12/21/2012 FINDINGS: Upper normal heart size post median sternotomy. Mediastinal contours and pulmonary vascularity normal. Subsegmental atelectasis versus scarring at LEFT base. Remaining lungs clear. No acute infiltrate, pleural effusion, or pneumothorax. Bones demineralized with note of a LEFT shoulder prosthesis and advanced RIGHT glenohumeral degenerative changes. IMPRESSION: Atelectasis versus scarring at LEFT base. Electronically Signed   By: Lavonia Dana M.D.   On: 04/22/2020 14:01   CT CORONARY FRACTIONAL FLOW RESERVE DATA PREP  Result Date: 04/12/2020 EXAM: FFRCT ANALYSIS FINDINGS: FFRct analysis was performed on the original cardiac CT angiogram dataset. Diagrammatic representation of the FFRct analysis is provided in a separate PDF document in PACS. This dictation was created using the PDF document and an interactive 3D model of the results. 3D model is not available in the EMR/PACS. Normal FFR range is >0.80. 1. Left Main: 0.99 2. BWG:YKZLDJTT 0.96, Mid 0.54, Distal <0.50 3. LCX: This vessel is not well analyzed. 4. Ramus: Proximal 0.98, Mid 0.82, Distal 0.70 1. 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 Recommend Cardiac catheterization. Note: These examples are not recommendations of [facility] and only provided as examples of what other customers are doing. Electronically Signed   By: Berniece Salines DO   On: 04/12/2020 13:06   ECHOCARDIOGRAM COMPLETE  Result Date: 04/23/2020    ECHOCARDIOGRAM REPORT   Patient Name:   Chaniece CAROL Olena Leatherwood Date of Exam: 04/23/2020 Medical Rec #:  [id number]                     Height:       60.0 in Accession #:    [id number]                    Weight:       123.0 lb Date of Birth:  01-20-1961  BSA:          1.518 m Patient Age:    15 years                       BP:           115/102 mmHg Patient Gender: F                             HR:           107 bpm. Exam Location:  Inpatient Procedure: 2D Echo and Color Doppler Indications:    Stroke 434.91 / I163.9  History:        Patient has prior history of Echocardiogram examinations, most                 recent 03/11/2020. Signs/Symptoms:Dyspnea; Risk                 Factors:Diabetes. Hypothyroidism.  Sonographer:    Tiffany Dance Referring Phys: [id number] RONDELL A SMITH IMPRESSIONS  1. Left ventricular ejection fraction, by estimation, is 20 to 25%. The left ventricle has severely decreased function. The left ventricle demonstrates global hypokinesis. Left ventricular diastolic parameters are indeterminate.  2. Right ventricular systolic function is normal. The right ventricular size is normal.  3. Left atrial size was moderately dilated.  4. The mitral valve is normal in structure. Mild mitral valve regurgitation. No evidence of mitral stenosis.  5. The aortic valve is tricuspid. Aortic valve regurgitation is not visualized. No aortic stenosis is present.  6. The inferior vena cava is normal in size with greater than 50% respiratory variability, suggesting right atrial pressure of 3 mmHg. FINDINGS  Left Ventricle: Left ventricular ejection fraction, by estimation, is 20 to 25%. The left ventricle has severely decreased function. The left ventricle demonstrates global hypokinesis. The left ventricular internal cavity size was normal in size. There is no left ventricular hypertrophy. Left ventricular diastolic parameters are indeterminate. Right Ventricle: The right ventricular size is normal. Right ventricular systolic function is normal. Left Atrium: Left atrial size was moderately dilated. Right Atrium: Right atrial size was normal in size. Pericardium: There is no evidence of pericardial effusion. Mitral Valve: The mitral valve is normal in structure. Mild mitral annular calcification. Mild mitral valve regurgitation. No  evidence of mitral valve stenosis. Tricuspid Valve: The tricuspid valve is normal in structure. Tricuspid valve regurgitation is trivial. No evidence of tricuspid stenosis. Aortic Valve: The aortic valve is tricuspid. Aortic valve regurgitation is not visualized. No aortic stenosis is present. Pulmonic Valve: The pulmonic valve was grossly normal. Pulmonic valve regurgitation is not visualized. No evidence of pulmonic stenosis. Aorta: The aortic root is normal in size and structure. Venous: The inferior vena cava is normal in size with greater than 50% respiratory variability, suggesting right atrial pressure of 3 mmHg. IAS/Shunts: No atrial level shunt detected by color flow Doppler.  LEFT VENTRICLE PLAX 2D LVIDd:         4.70 cm LVIDs:         4.00 cm LV PW:         1.20 cm LV IVS:        0.70 cm LVOT diam:     1.90 cm LV SV:         19 LV SV Index:   13 LVOT Area:     2.84 cm  RIGHT VENTRICLE  IVC RV Basal diam:  2.20 cm    IVC diam: 2.10 cm RV S prime:     6.42 cm/s TAPSE (M-mode): 1.2 cm LEFT ATRIUM             Index       RIGHT ATRIUM           Index LA diam:        5.10 cm 3.36 cm/m  RA Area:     12.90 cm LA Vol (A2C):   68.6 ml 45.19 ml/m RA Volume:   27.30 ml  17.98 ml/m LA Vol (A4C):   64.6 ml 42.55 ml/m LA Biplane Vol: 67.3 ml 44.33 ml/m  AORTIC VALVE LVOT Vmax:   48.75 cm/s LVOT Vmean:  30.800 cm/s LVOT VTI:    0.068 m  AORTA Ao Root diam: 2.70 cm Ao Asc diam:  3.40 cm MITRAL VALVE MV Area (PHT): 5.13 cm    SHUNTS MV Decel Time: 148 msec    Systemic VTI:  0.07 m MV E velocity: 92.40 cm/s  Systemic Diam: 1.90 cm MV A velocity: 71.10 cm/s MV E/A ratio:  1.30 Kirk Ruths MD Electronically signed by Kirk Ruths MD Signature Date/Time: 04/23/2020/2:25:22 PM    Final    ECHO TEE  Result Date: 04/24/2020    TRANSESOPHOGEAL ECHO REPORT   Patient Name:   Marya Amsler Date of Exam: 04/24/2020 Medical Rec #:  [id number]                     Height:       60.0 in Accession #:     [id number]                    Weight:       128.7 lb Date of Birth:  12/21/1960                    BSA:          1.548 m Patient Age:    46 years                      BP:           135/98 mmHg Patient Gender: F                             HR:           90 bpm. Exam Location:  Inpatient Procedure: Transesophageal Echo, 3D Echo, Color Doppler and Cardiac Doppler Indications:     Stroke i163.9  History:         Patient has prior history of Echocardiogram examinations, most                  recent 04/23/2020. Risk Factors:Hypertension and Diabetes.  Sonographer:     Raquel Sarna Senior RDCS Referring Phys:  [id number] Greer Ee [place] Diagnosing Phys: Gwyndolyn Kaufman MD PROCEDURE: After discussion of the risks and benefits of a TEE, an informed consent was obtained from the patient. The transesophogeal probe was passed without difficulty through the esophogus of the patient. Local oropharyngeal anesthetic was provided with Cetacaine. Sedation performed by different physician. The patient was monitored while under deep sedation. Anesthestetic sedation was provided intravenously by Anesthesiology: 30m of Propofol. The patient developed no complications during the procedure. IMPRESSIONS  1. Left ventricular ejection fraction, by estimation, is 20 to 25%. The left ventricle  has severely decreased function. The left ventricle demonstrates global hypokinesis.  2. Right ventricular systolic function is moderately reduced. The right ventricular size is normal.  3. Left atrial size was moderately dilated. No left atrial/left atrial appendage thrombus was detected. The LAA emptying velocity was 30 cm/s.  4. Right atrial size was mildly dilated.  5. The mitral valve is degenerative. Mild to moderate mitral valve regurgitation.  6. The aortic valve is tricuspid. There is mild calcification of the aortic valve. There is mild thickening of the aortic valve. Aortic valve regurgitation is trivial.  7. 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  8. A small PFO was detected by color flow Doppler with predominant L-->R shunting, however, there is flow reversal (R-->L) with valsalva.  9. Agitated saline contrast bubble study was positive with shunting observed within 3-6 cardiac cycles with valsalva suggestive of interatrial shunt. FINDINGS  Left Ventricle: Left ventricular ejection fraction, by estimation, is 20 to 25%. The left ventricle has severely decreased function. The left ventricle demonstrates global hypokinesis. The left ventricular internal cavity size was normal in size. Right Ventricle: The right ventricular size is normal. No increase in right ventricular wall thickness. Right ventricular systolic function is moderately reduced. Left Atrium: Left atrial size was moderately dilated. No left atrial/left atrial appendage thrombus was detected. The LAA emptying velocity was 30 cm/s. Right Atrium: Right atrial size was mildly dilated. Pericardium: There is no evidence of pericardial effusion. Mitral Valve: The mitral valve is degenerative in appearance. There is mild thickening of the mitral valve leaflet(s). There is mild calcification of the mitral valve leaflet(s). Mild to moderate mitral valve regurgitation. Tricuspid Valve: The tricuspid valve is normal in structure. Tricuspid valve regurgitation is mild. Aortic Valve: The aortic valve is tricuspid. There is mild calcification of the aortic valve. There is mild thickening of the aortic valve. Aortic valve regurgitation is trivial. 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 Pulmonic Valve: The pulmonic valve was normal in structure. Pulmonic valve regurgitation is trivial. Aorta: The aortic root and ascending aorta are structurally normal, with no evidence of dilitation. IAS/Shunts: Evidence of atrial level shunting detected by color flow Doppler.  Agitated saline contrast was given intravenously to evaluate for intracardiac shunting. Agitated saline contrast bubble study was positive with shunting observed within 3-6 cardiac cycles suggestive of interatrial shunt. A small patent foramen ovale is detected. Gwyndolyn Kaufman MD Electronically signed by Gwyndolyn Kaufman MD Signature Date/Time: 04/24/2020/9:38:38 AM    Final    VAS [place] LOWER EXTREMITY VENOUS (DVT)  Result Date: 04/23/2020  Lower Venous DVT Study Indications: SOB.  Risk Factors: None identified. Anticoagulation: Lovenox. Limitations: Patient movement. Comparison Study: No previous exams Performing Technologist: Vonzell Schlatter RVT  Examination Guidelines: A complete evaluation includes B-mode imaging, spectral Doppler, color Doppler, and power Doppler as needed of all accessible portions of each vessel. Bilateral testing is considered an integral part of a complete examination. Limited examinations for reoccurring indications may be performed as noted. The reflux portion of the exam is performed with the patient in reverse Trendelenburg.  +---------+---------------+---------+-----------+----------+--------------+ RIGHT    CompressibilityPhasicitySpontaneityPropertiesThrombus Aging +---------+---------------+---------+-----------+----------+--------------+ CFV      Full           Yes      Yes                                 +---------+---------------+---------+-----------+----------+--------------+  SFJ      Full                                                        +---------+---------------+---------+-----------+----------+--------------+ FV Prox  Full                                                        +---------+---------------+---------+-----------+----------+--------------+ FV Mid   Full                                                        +---------+---------------+---------+-----------+----------+--------------+ FV DistalFull                                                         +---------+---------------+---------+-----------+----------+--------------+ PFV      Full                                                        +---------+---------------+---------+-----------+----------+--------------+ POP      Full           Yes      Yes                                 +---------+---------------+---------+-----------+----------+--------------+ PTV      Full                                                        +---------+---------------+---------+-----------+----------+--------------+ PERO     Full                                                        +---------+---------------+---------+-----------+----------+--------------+   +---------+---------------+---------+-----------+----------+--------------+ LEFT     CompressibilityPhasicitySpontaneityPropertiesThrombus Aging +---------+---------------+---------+-----------+----------+--------------+ CFV      Full           Yes      Yes                                 +---------+---------------+---------+-----------+----------+--------------+ SFJ      Full                                                        +---------+---------------+---------+-----------+----------+--------------+  FV Prox  Full                                                        +---------+---------------+---------+-----------+----------+--------------+ FV Mid   Full                                                        +---------+---------------+---------+-----------+----------+--------------+ FV DistalFull                                                        +---------+---------------+---------+-----------+----------+--------------+ PFV      Full                                                        +---------+---------------+---------+-----------+----------+--------------+ POP      Full           Yes      Yes                                  +---------+---------------+---------+-----------+----------+--------------+ PTV      Full                                                        +---------+---------------+---------+-----------+----------+--------------+ PERO     Full                                                        +---------+---------------+---------+-----------+----------+--------------+  Summary: BILATERAL: - No evidence of deep vein thrombosis seen in the lower extremities, bilaterally. -No evidence of popliteal cyst, bilaterally.   *See table(s) above for measurements and observations. Electronically signed by Deitra Mayo MD on 04/23/2020 at 11:27:04 AM.    Final    US Abdomen Limited RUQ (LIVER/GB)  Result Date: 04/24/2020 CLINICAL DATA:  Elevated liver enzymes EXAM: ULTRASOUND ABDOMEN LIMITED RIGHT UPPER QUADRANT COMPARISON:  None. FINDINGS: Gallbladder: Surgically absent. Common bile duct: Diameter: 5 mm. No intrahepatic or extrahepatic biliary duct dilatation. Liver: No focal lesion identified. Within normal limits in parenchymal echogenicity. Portal vein is patent on color Doppler imaging with normal direction of blood flow towards the liver. Other: None. IMPRESSION: Gallbladder absent.  Study otherwise unremarkable. Electronically Signed   By: Lowella Grip III M.D.   On: 04/24/2020 15:05      Subjective: Patient seen and examined at bedside, resting comfortably in bedside chair.  Continues with left upper extremity weakness, slightly improved.  No specific complaints this morning.  Denies headache, no visual changes, no chest pain, palpitations, no shortness of breath, no abdominal pain, no fever/chills/night sweats, no nausea/vomiting/diarrhea.  No acute events overnight per nurse staff.  Discharging to CIR today.  Discharge Exam: Vitals:   04/26/20 1117 04/26/20 1457  BP: 118/77 103/70  Pulse: 97 90  Resp: 16   Temp: (!) 96.9 F (36.1 C)   SpO2: 95%    Vitals:   04/26/20 0336  04/26/20 0753 04/26/20 1117 04/26/20 1457  BP: 116/85 137/79 118/77 103/70  Pulse: 90 89 97 90  Resp: _0 Temp: 98.7 F (37.1 C) 98.6 F (37 C) (!) 96.9 F (36.1 C)   TempSrc: Oral Oral Oral   SpO2: 99% 98% 95%   Weight:      Height:        General: Pt is alert, awake, not in acute distress Cardiovascular: RRR, S1/S2 +, no rubs, no gallops Respiratory: CTA bilaterally, no wheezing, no rhonchi Abdominal: Soft, NT, ND, bowel sounds + Extremities: no edema, no cyanosis, left lower extremity muscle strength 4/5 with decreased handgrip, left lower extremity 4+/5    The results of significant diagnostics from this hospitalization (including imaging, microbiology, ancillary and laboratory) are listed below for reference.     Microbiology: Recent Results (from the past 240 hour(s))  SARS Coronavirus 2 by RT PCR (hospital order, performed in [facility] hospital lab) Nasopharyngeal Nasopharyngeal Swab     Status: None   Collection Time: 04/22/20  1:42 PM   Specimen: Nasopharyngeal Swab  Result Value Ref Range Status   SARS Coronavirus 2 NEGATIVE NEGATIVE Final    Comment: (NOTE) SARS-CoV-2 target nucleic acids are NOT DETECTED.  The SARS-CoV-2 RNA is generally detectable in upper and lower respiratory specimens during the acute phase of infection. The lowest concentration of SARS-CoV-2 viral copies this assay can detect is 250 copies / mL. A negative result does not preclude SARS-CoV-2 infection and should not be used as the sole basis for treatment or other patient management decisions.  A negative result may occur with improper specimen collection / handling, submission of specimen other than nasopharyngeal swab, presence of viral mutation(s) within the areas targeted by this assay, and inadequate number of viral copies (<250 copies / mL). A negative result must be combined with clinical observations, patient history, and epidemiological information.  Fact Sheet for  Patients:   StrictlyIdeas.no  Fact Sheet for Healthcare Providers: BankingDealers.co.za  This test is not yet approved or  cleared by the [place] FDA and has been authorized for detection and/or diagnosis of SARS-CoV-2 by FDA under an Emergency Use Authorization (EUA).  This EUA will remain in effect (meaning this test can be used) for the duration of the COVID-19 declaration under Section 564(b)(1) of the Act, 21 U.S.C. section 360bbb-3(b)(1), unless the authorization is terminated or revoked sooner.  Performed at [facility], [place] [address], [place], [place] [address]      Labs: BNP (last 3 results) No results for input(s): BNP in the last 8760 hours. Basic Metabolic Panel: Recent Labs  Lab 04/23/20 0337 04/24/20 0114 04/25/20 0357 04/25/20 1710 04/25/20 2036 04/26/20 0550  NA 136 138 137 135  --  138  K 3.5 3.8 6.0* >7.5* 4.8 4.1  CL 99 101 104 102  --  98  CO2 _1 14*  --  27  GLUCOSE 127* 131* 110* 130*  --  102*  BUN _2 --  10  CREATININE 0.64 0.55 0.72 0.66  --  0.61  CALCIUM 8.0* 8.3* 8.3* 8.6*  --  8.7*  MG  --  1.6*  --   --   --   --    Liver Function Tests: Recent Labs  Lab 04/22/20 1342 04/23/20 0337 04/24/20 0114 04/25/20 0357 04/26/20 0550  AST 65* 203* 569* 989* 501*  ALT 62* 121* 313* 606* 608*  ALKPHOS 162* 218* 312* 386* 468*  BILITOT 0.9 1.1 1.1 1.4* 1.4*  PROT 5.3* 5.3* 5.1* 4.9* 5.3*  ALBUMIN 2.7* 2.7* 2.6* 2.5* 2.8*   No results for input(s): LIPASE, AMYLASE in the last 168 hours. No results for input(s): AMMONIA in the last 168 hours. CBC: Recent Labs  Lab 04/22/20 1342 04/23/20 0337 04/24/20 0114  WBC 13.2* 13.0* 12.5*  NEUTROABS 10.6*  --   --   HGB 14.9 14.5 13.5  HCT 49.0* 47.7* 42.5  MCV 100.8* 102.4* 102.2*  PLT 342 319 292   Cardiac Enzymes: Recent Labs  Lab 04/22/20 1342  CKTOTAL 87   BNP: Invalid input(s): POCBNP CBG: Recent Labs   Lab 04/25/20 1157 04/25/20 1653 04/25/20 2116 04/26/20 0615 04/26/20 1233  GLUCAP 100* 116* 143* 106* 94   D-Dimer No results for input(s): DDIMER in the last 72 hours. Hgb A1c No results for input(s): HGBA1C in the last 72 hours. Lipid Profile No results for input(s): CHOL, HDL, LDLCALC, TRIG, CHOLHDL, LDLDIRECT in the last 72 hours. Thyroid function studies Recent Labs    04/24/20 2158  T3FREE 2.1   Anemia work up No results for input(s): VITAMINB12, FOLATE, FERRITIN, TIBC, IRON, RETICCTPCT in the last 72 hours. Urinalysis    Component Value Date/Time   COLORURINE YELLOW 04/23/2020 0139   APPEARANCEUR HAZY (A) 04/23/2020 0139   LABSPEC 1.030 04/23/2020 0139   PHURINE 6.0 04/23/2020 0139   GLUCOSEU NEGATIVE 04/23/2020 0139   HGBUR LARGE (A) 04/23/2020 0139   BILIRUBINUR NEGATIVE 04/23/2020 0139   KETONESUR NEGATIVE 04/23/2020 0139   PROTEINUR 100 (A) 04/23/2020 0139   NITRITE NEGATIVE 04/23/2020 0139   LEUKOCYTESUR MODERATE (A) 04/23/2020 0139   Sepsis Labs Invalid input(s): PROCALCITONIN,  WBC,  LACTICIDVEN Microbiology Recent Results (from the past 240 hour(s))  SARS Coronavirus 2 by RT PCR (hospital order, performed in [place] hospital lab) Nasopharyngeal Nasopharyngeal Swab     Status: None   Collection Time: 04/22/20  1:42 PM   Specimen: Nasopharyngeal Swab  Result Value Ref Range Status   SARS Coronavirus 2 NEGATIVE NEGATIVE Final    Comment: (NOTE) SARS-CoV-2 target nucleic acids are NOT DETECTED.  The SARS-CoV-2 RNA is generally detectable in upper and lower respiratory specimens during the acute phase of infection. The lowest concentration of SARS-CoV-2 viral copies this assay can detect is 250 copies / mL. A negative result does not preclude SARS-CoV-2 infection and should not be used as the sole basis for treatment or other patient management decisions.  A negative result may occur with improper specimen collection / handling, submission of  specimen other than nasopharyngeal swab, presence of viral mutation(s) within the areas targeted by this assay, and inadequate number of viral copies (<250 copies / mL). A negative result must be combined with clinical observations, patient history, and epidemiological information.  Fact Sheet for Patients:   StrictlyIdeas.no  Fact Sheet for Healthcare Providers: BankingDealers.co.za  This test is not yet approved or  cleared by the [place] FDA and has been authorized for detection and/or diagnosis of SARS-CoV-2 by FDA under an  Emergency Use Authorization (EUA).  This EUA will remain in effect (meaning this test can be used) for the duration of the COVID-19 declaration under Section 564(b)(1) of the Act, 21 U.S.C. section 360bbb-3(b)(1), unless the authorization is terminated or revoked sooner.  Performed at [facility], [place] [address], [place], [place] [address]      Time coordinating discharge: Over 30 minutes  SIGNED:   Chong January J [place] DO  [facility] 04/26/2020, 3:02 PM

## 2020-04-27 DIAGNOSIS — I63511 Cerebral infarction due to unspecified occlusion or stenosis of right middle cerebral artery: Secondary | ICD-10-CM

## 2020-04-27 LAB — GLUCOSE, CAPILLARY
Glucose-Capillary: 95 mg/dL (ref 70–99)
Glucose-Capillary: 119 mg/dL — ABNORMAL HIGH (ref 70–99)
Glucose-Capillary: 203 mg/dL — ABNORMAL HIGH (ref 70–99)
Glucose-Capillary: 186 mg/dL — ABNORMAL HIGH (ref 70–99)

## 2020-04-27 MED ORDER — ADULT MULTIVITAMIN W/MINERALS CH
1.0000 | ORAL_TABLET | Freq: Every day | ORAL | Status: DC
  Administered 2020-04-27 – 2020-05-16 (×20): 1 via ORAL
  Filled 2020-04-27 (×20): qty 1

## 2020-04-27 MED ORDER — ENSURE ENLIVE PO LIQD
237.0000 mL | Freq: Two times a day (BID) | ORAL | Status: DC
  Administered 2020-04-27 – 2020-05-10 (×20): 237 mL via ORAL

## 2020-04-27 MED ORDER — PREDNISONE 20 MG PO TABS
20.0000 mg | ORAL_TABLET | Freq: Every day | ORAL | Status: DC
  Administered 2020-04-27 – 2020-05-01 (×5): 20 mg via ORAL
  Filled 2020-04-27 (×5): qty 1

## 2020-04-27 MED ORDER — SORBITOL 70 % SOLN
30.0000 mL | Freq: Every day | Status: DC | PRN
  Administered 2020-04-27 – 2020-05-14 (×3): 30 mL via ORAL
  Filled 2020-04-27 (×3): qty 30

## 2020-04-27 NOTE — Progress Notes (Signed)
Physical Therapy Session Note  Patient Details  Name: Madison Hurst MRN: [id number] Date of Birth: April 04, 1960  Today's Date: 04/27/2020 PT Individual Time: 3016-0109 PT Individual Time Calculation (min): 57 min   Short Term Goals: Week 1:  PT Short Term Goal 1 (Week 1): Pt will perform supine<>sit with CGA PT Short Term Goal 2 (Week 1): Pt will perform sit<>stand using LRAD with min assist PT Short Term Goal 3 (Week 1): Pt will perform bed<>chair transfers using LRAD with min assist PT Short Term Goal 4 (Week 1): Pt will ambulate at least 47ft using LRAD with min assist  Skilled Therapeutic Interventions/Progress Updates:    Pt received supine in bed resting but with encouragement agreeable to therapy session. Supine>sitting L EOB, not using bed features, with min assist for trunk upright. R stand pivot EOB>w/c L HHA with mod assist for balance due to increased posterior lean - cuing for midline orientation and sequencing of transfer.  Transported to/from gym in w/c for time management and energy conservation. No youth RW available at this time. Pt discussed having tried to use her husband's RW before (his height 5'3") but states she has fallen with it many times or tips it over stating she never learned how to use it. Pt agreeable to trial RW and discussed possibility of using PFRW if experiences increased pain gripping onto RW but discussed PFs making RW more heavy and possibly more difficult to turn and manage. Sit>stand w/c>RW min assist for lifting - cuing for proper UE placement for safety with AD. Gait training ~69ft using RW, min assist for balance - pt demos even further decreased gait speed and appears afraid/unsure of herself - pt states she is having increased fear of falling compared to early session when ambulating with L HHA stating she knows the walker wont be able to get her off the floor if she were to fall - transitioned to L HHA ~ 58ft further with mod assist for  balance and pt continuing to demo significantly decreased gait speed with very short shuffled steps, decreased foot clearance, and stiff trunk. Pt reports need to use bathroom. Transported back to room. R stand pivot w/c>toilet using UE support on grab bar with min assist for balance - standing with min assist due to posterior lean, LB clothing management without assist but increased time. Continent of bladder and bowels - standing posterior peri-care total assist and anterior set-up assist - requires constant mod assist for balance while standing due to moderate posterior lean. L stand pivot toilet>w/c mod assist for balance due to posterior lean and cuing for stepping. Standing hand hygiene at sink with CGA/min assist for balance and pt using forearm support on counter. Gait training ~73ft L HHA min and intermittent mod assist for balance when turning due to lateral and posterior lean - same gait mechanics as above. Pt left seated in w/c with needs in reach and seat belt alarm on.  Therapy Documentation Precautions:  Precautions Precautions: Fall,Other (comment) Precaution Comments: EF 20-25%, L hemiparesis, RA (hands, shoulders, and ankles) Restrictions Weight Bearing Restrictions: No  Pain:   No complaints of pain during session - RN offered medication and pt declined at this time.   Therapy/Group: Individual Therapy  Ginny Forth , PT, DPT, CSRS  04/27/2020, 4:09 PM

## 2020-04-27 NOTE — Progress Notes (Signed)
Initial Nutrition Assessment  DOCUMENTATION CODES:   Not applicable  INTERVENTION:    Ensure Enlive po BID, each supplement provides 350 kcal and 20 grams of protein  MVI daily   NUTRITION DIAGNOSIS:   Increased nutrient needs related to other (see comment) (extensive therapies) as evidenced by estimated needs.  GOAL:   Patient will meet greater than or equal to 90% of their needs  MONITOR:   PO intake,Supplement acceptance,Weight trends,Labs,I & O's  REASON FOR ASSESSMENT:   Malnutrition Screening Tool    ASSESSMENT:   Patient with PMH significant for substernal goiter, CAD, HTN, HLD, and DM. 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   Attempted to contact patient x2, unable to obtain history. Appetite looks to be okay this admission. Tolerating current diet texture/consistency. Last meal completion charted as 85%. RD to provide supplementation to maximize kcal and protein.   Weight noted to be stable over the last three months. Utilize EDW of 56.6 kg for now. Will attempt to obtain nutrition/weight history at later data if possible.   Medications: folic acid, SS novolog, prednisone Labs: LFTs elevated CBG 94-155  Diet Order:   Diet Order            Diet heart healthy/carb modified Room service appropriate? Yes; Fluid consistency: Thin  Diet effective now                 EDUCATION NEEDS:   Not appropriate for education at this time  Skin:  Skin Assessment: Reviewed RN Assessment  Last BM:  PTA  Height:   Ht Readings from Last 1 Encounters:  04/26/20 5' (1.524 m)    Weight:   Wt Readings from Last 1 Encounters:  04/26/20 56.6 kg    BMI:  Body mass index is 24.37 kg/m.  Estimated Nutritional Needs:   Kcal:  1700-1900 kcal  Protein:  85-100 grams  Fluid:  >/= 1.7 L/day   Vanessa Kick RD, LDN Clinical Nutrition Pager listed in [facility]

## 2020-04-27 NOTE — Progress Notes (Signed)
[facility] PHYSICAL MEDICINE & REHABILITATION PROGRESS NOTE   Subjective/Complaints:  Has soreness bilateral hands and feet.  She feels like her rheumatoid is acting up.  Discussed liver function tests trending downwards. Participating well with OT  Review of systems denies chest pain shortness of breath nausea vomiting diarrhea or constipation  Objective:   No results found. No results for input(s): WBC, HGB, HCT, PLT in the last 72 hours. Recent Labs    04/25/20 1710 04/25/20 2036 04/26/20 0550  NA 135  --  138  K >7.5* 4.8 4.1  CL 102  --  98  CO2 14*  --  27  GLUCOSE 130*  --  102*  BUN 14  --  10  CREATININE 0.66  --  0.61  CALCIUM 8.6*  --  8.7*    Intake/Output Summary (Last 24 hours) at 04/27/2020 1207 Last data filed at 04/27/2020 0800 Gross per 24 hour  Intake 200 ml  Output --  Net 200 ml        Physical Exam: Vital Signs Blood pressure 110/82, pulse 84, temperature 97.7 F (36.5 C), resp. rate 16, height 5' (1.524 m), weight 56.6 kg, SpO2 100 %.  General: No acute distress Mood and affect are appropriate Heart: Regular rate and rhythm no rubs murmurs or extra sounds Lungs: Clear to auscultation, breathing unlabored, no rales or wheezes Abdomen: Positive bowel sounds, soft nontender to palpation, nondistended Extremities: No clubbing, cyanosis, or edema Skin: No evidence of breakdown, no evidence of rash Neurologic: Cranial nerves II through XII intact, motor strength is 4/5 in bilateral deltoid, bicep, tricep, grip, hip flexor, knee extensors, ankle dorsiflexor and plantar flexor, patient is limited with shoulder range of motion Sensory exam normal sensation to light touch and proprioception in bilateral upper and lower extremities Cerebellar exam normal finger to nose to finger as well as heel to shin in bilateral upper and lower extremities Musculoskeletal: Bilateral shoulder abduction and forward flexion is 75% of normal pain and swelling in  digits 2 3 bilaterally at the MCPs, tenderness over the metatarsal phalangeal joints as well   Assessment/Plan: 1. Functional deficits which require 3+ hours per day of interdisciplinary therapy in a comprehensive inpatient rehab setting.  Physiatrist is providing close team supervision and 24 hour management of active medical problems listed below.  Physiatrist and rehab team continue to assess barriers to discharge/monitor patient progress toward functional and medical goals  Care Tool:  Bathing              Bathing assist       Upper Body Dressing/Undressing Upper body dressing   What is the patient wearing?: Hospital gown only    Upper body assist      Lower Body Dressing/Undressing Lower body dressing      What is the patient wearing?: Incontinence brief     Lower body assist       Toileting Toileting    Toileting assist       Transfers Chair/bed transfer  Transfers assist           Locomotion Ambulation   Ambulation assist              Walk 10 feet activity   Assist           Walk 50 feet activity   Assist           Walk 150 feet activity   Assist           Walk 10  feet on uneven surface  activity   Assist           Wheelchair     Assist               Wheelchair 50 feet with 2 turns activity    Assist            Wheelchair 150 feet activity     Assist          Blood pressure 110/82, pulse 84, temperature 97.7 F (36.5 C), resp. rate 16, height 5' (1.524 m), weight 56.6 kg, SpO2 100 %.   Medical Problem List and Plan: 1.  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  Antithrombotics: -DVT/anticoagulation: Eliquis 5 mg twice daily.  Lower extremity Dopplers negative              -antiplatelet therapy: Aspirin 81 mg daily 3. Pain Management: Hydrocodone as needed             Monitor with increased exertion 4. Mood: Cymbalta 60 mg daily, Wellbutrin 150 mg daily, Ativan 0.5 mg every 8 hours as needed             -antipsychotic agents: N/A 5. Neuropsych: This patient is capable of making decisions on her own behalf. 6. Skin/Wound Care: Routine skin checks 7. Fluids/Electrolytes/Nutrition: Routine in and outs.  CMP ordered 8.  History of PAF/CAD/PFO.  30-day cardiac event monitor per cardiology services Madison Hurst.  Continue Eliquis for now.  If EF improves and no atrial fibrillation in 3 months plan to stop Eliquis and restart Plavix.             Monitor with increased activity 9.  Transaminitis.  Follow-up gastroenterology service Madison Hurst.               Autoimmune markers pending             CMP ordered 10.  Hyperlipidemia.  Crestor on hold due to elevated LFTs. 11.  Rheumatoid arthritis: Low-dose prednisone as well as weekly Orencia            Having rheumatoid flare will increase prednisone 12.  Diabetes mellitus.  Hemoglobin A1c 6.1.  SSI.  Patient on Metformin 500 mg daily prior to admission.  Resume as needed          CBG (last 3)  Recent Labs    04/26/20 2100 04/27/20 0600 04/27/20 1114  GLUCAP 110* 95 119*  Controlled 04/27/2020   13.  Hypothyroidism: Synthroid 14.  Essential hypertension.. Blood pressure had remained a bit soft Toprol-XL resumed at 12.5 mg daily 04/26/2020           Vitals:   04/27/20 0507 04/27/20 1000  BP: 116/81 110/82  Pulse: 74 84  Resp: 15 16  Temp: 97.7 F (36.5 C)   SpO2: 100% 100%      LOS: 1 days A FACE TO FACE EVALUATION WAS PERFORMED  Madison Hurst 04/27/2020, 12:07 PM

## 2020-04-27 NOTE — Evaluation (Signed)
Physical Therapy Assessment and Plan  Patient Details  Name: Madison Hurst MRN: [id number] Date of Birth: [birth year]  PT Diagnosis: Abnormal posture, Abnormality of gait, Cognitive deficits, Difficulty walking, Edema, Hemiparesis non-dominant, Impaired cognition, Muscle weakness, Pain in joint and Pain in shoulders, hands, and ankles due to RA Rehab Potential: Fair ELOS: ~2-2.5 weeks   Today's Date: 04/27/2020 PT Individual Time: 0810-0923 PT Individual Time Calculation (min): 73 min    Hospital Problem: Principal Problem:   Right middle cerebral artery stroke [facility])   Past Medical History:  Past Medical History:  Diagnosis Date  . Anemia 02/09/2020  . Anxiety 10/28/2019  . Depressed left ventricular ejection fraction 03/13/2020  . Depressive disorder 10/28/2019  . DOE (dyspnea on exertion) 03/13/2020  . Hypothyroidism 02/09/2020  . Iron deficiency anemia 01/24/2020  . Metabolic syndrome 60/73/7106  . Multinodular goiter 03/07/2015  . PAC (premature atrial contraction) 03/13/2020  . PAT (paroxysmal atrial tachycardia) ([place]) 03/13/2020  . Pericardial effusion 02/09/2020  . 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 Seropositive rheumatoid arthritis ([place]) 02/09/2020  . 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 Type 2 diabetes mellitus ([place]) 05/08/2019  . Vitamin D deficiency 02/09/2020   Past Surgical History:  Past Surgical History:  Procedure Laterality Date  . BUBBLE STUDY  04/24/2020   Procedure: BUBBLE STUDY;  Surgeon: Freada Bergeron, MD;  Location: [place];  Service: Cardiovascular;;  . CESAREAN SECTION    . CHOLECYSTECTOMY    . other     Growth removal on chest  . TEE WITHOUT CARDIOVERSION N/A 04/24/2020   Procedure: TRANSESOPHAGEAL ECHOCARDIOGRAM (TEE);  Surgeon: Freada Bergeron, MD;  Location: [facility] ENDOSCOPY;  Service: Cardiovascular;  Laterality: N/A;  . TOTAL SHOULDER ARTHROPLASTY Left     Assessment & Plan Clinical Impression: Patient is  a 60 y.o. year old right-handed female with history of paroxysmal atrial tachycardia followed by Dr.Kardie Tobb, CAD, hypertension, diabetes mellitus, chronic anemia, hyperlipidemia, rheumatoid arthritis maintained on chronic prednisone, anxiety and depression.  History taken from chart review and patient.  Patient lives with spouse.  1 level home 2 steps to entry.  Patient reports ambulating independently but with poor balance.  She presented on 04/22/2018 with progressive left hemiparesis over 1 week resulting in 3 falls.  Admission chemistries glucose 173, AST 65, ALT 62, alkaline phosphatase, alcohol 162, negative hemoglobin A1c 6.1.  Cranial CT unremarkable for acute intracranial process.  Patient did not receive TPA.  MRI showed acute infarction affecting the right mid posterior caudate and right superior thalamus.  Single separate punctate acute infarct in the cortex of the medial right parieto-occipital junction.  Few small punctate acute infarcts in the right occipital cortex.  No evidence of hemorrhage or mass-effect.  CT angiogram of head and neck mild stenosis of the left PCA P3 segment.  No emergent large vessel occlusion.  MRI cervical spine showed apparent moderate to severe cervical spinal stenosis at C4-C5 and C5-C6, and moderate at C3-4.  Multilevel neuroforaminal narrowing greatest at C3-4 and on the right C4-5.  Echocardiogram with ejection fraction of 20-25%, with left ventricle global hypokinesis. TEE showed mild to moderate MR.  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  No LAA thrombus.  Lower extremity Dopplers negative.  Orders were given for a video monitor to further evaluate etiology of her CVA.  Currently maintained on aspirin 81 mg daily as well as Eliquis.  LFTs have continued to be elevated and monitored.  Acute hepatitis panel negative.  Her Crestor was discontinued  due to elevated LFTs.  Ultrasound of the abdomen showed no focal lesion identified.  Gallbladder was absent.   Study otherwise unremarkable.  Autoimmune markers are pending.  There is some question of possible need for liver biopsy however felt to be somewhat risky while maintained on Eliquis gastroenterology services Dr. Henrene Pastor has been consulted to follow rising LFTs.  Due to patient's progressive left hemiparesis, she was admitted for a comprehensive rehab program. Patient transferred to CIR on 04/26/2020 .   Patient currently requires mod assist with mobility secondary to muscle weakness and muscle joint tightness, decreased cardiorespiratoy endurance, impaired timing and sequencing, unbalanced muscle activation, decreased coordination and decreased motor planning,  , decreased attention, decreased awareness, decreased problem solving, decreased safety awareness, decreased memory and delayed processing and decreased sitting balance, decreased standing balance, decreased postural control, hemiplegia and decreased balance strategies.  Prior to hospitalization, patient was independent  with mobility and lived with Spouse,Son in a House home.  Home access is 1step-up on to porch and 2 steps up into house.  Patient will benefit from skilled PT intervention to maximize safe functional mobility, minimize fall risk and decrease caregiver burden for planned discharge home with 24 hour supervision.  Anticipate patient will benefit from follow up [place] at discharge.  PT - End of Session Activity Tolerance: Tolerates 30+ min activity with multiple rests Endurance Deficit: Yes Endurance Deficit Description: requires seated rest breaks PT Assessment Rehab Potential (ACUTE/IP ONLY): Fair PT Barriers to Discharge: Decreased caregiver support;Inaccessible home environment;Other (comments);Lack of/limited family support PT Barriers to Discharge Comments: RA PT Patient demonstrates impairments in the following area(s): Balance;Perception;Safety;Edema;Sensory;Endurance;Skin Integrity;Motor;Nutrition;Pain;Behavior PT Transfers  Functional Problem(s): Bed Mobility;Bed to Chair;Car;Furniture PT Locomotion Functional Problem(s): Ambulation;Stairs PT Plan PT Intensity: Minimum of 1-2 x/day ,45 to 90 minutes PT Frequency: 5 out of 7 days PT Duration Estimated Length of Stay: ~2-2.5 weeks PT Treatment/Interventions: Ambulation/gait training;Community reintegration;DME/adaptive equipment instruction;Neuromuscular re-education;Psychosocial support;Stair training;UE/LE Strength taining/ROM;Balance/vestibular training;Discharge planning;Functional electrical stimulation;Pain management;Skin care/wound management;Therapeutic Activities;UE/LE Coordination activities;Cognitive remediation/compensation;Disease management/prevention;Functional mobility training;Patient/family education;Visual/perceptual remediation/compensation;Therapeutic Exercise;Splinting/orthotics PT Transfers Anticipated Outcome(s): CGA PT Locomotion Anticipated Outcome(s): CGA using LRAD PT Recommendation Follow Up Recommendations: Home health PT;24 hour supervision/assistance Patient destination: Home Equipment Recommended: To be determined   PT Evaluation Precautions/Restrictions Precautions Precautions: Fall;Other (comment) Precaution Comments: EF 20-25%, L hemiparesis, RA (hands, shoulders, and ankles) Restrictions Weight Bearing Restrictions: No Pain Pain Assessment Pain Scale: 0-10 Pain Score: 5  Pain Type: Acute pain;Other (Comment) (swelling) Pain Location: Foot Pain Orientation: Right;Left Pain Descriptors / Indicators: Other (Comment) (states her hands and feet feel like "bubbles...like they just want to pop") Pain Onset: On-going (started yesterday) Pain Intervention(s): Rest;Repositioned Home Living/Prior Functioning Home Living Available Help at Discharge: Family;Available PRN/intermittently Type of Home: House Home Access: Stairs to enter [employer] of Steps: 1step-up on to porch and 2 steps up into house Entrance  Stairs-Rails: Left (on both) Home Layout: One level  Lives With: Spouse;Son (son 76y.o., husband Scottie) Prior Function Level of Independence: Other (comment);Independent with gait;Independent with transfers (reports needed some assistance with tasks such as buttoning shirts and opening jars due to arthritis)  Able to Take Stairs?: Yes Driving: Yes Leisure: Hobbies-yes (Comment) Comments: loves reading Perception  Perception: Within Functional Limits Praxis Praxis: Intact  Cognition  Overall Cognitive Status: Impaired/Different from baseline Arousal/Alertness: Awake/alert Orientation Level: Oriented to person;Oriented to place;Oriented to situation;Disoriented to time (asked if it was AM or PM when looking at the clock - aware of month and year but not day of week (  thought it was Tuesday)) Attention: Focused;Selective Focused Attention: Appears intact Sustained Attention: Appears intact Memory: Impaired Safety/Judgment: Appears intact Sensation  Sensation Light Touch: Appears Intact (able to sense light touch but states "tingling/numbness" in L foot) Hot/Cold: Not tested Proprioception: Appears Intact Stereognosis: Not tested Coordination Gross Motor Movements are Fluid and Coordinated: No Fine Motor Movements are Fluid and Coordinated: No Coordination and Movement Description: impaired due to hx of RA B UE shoulders and hands, L hemiparesis, impaired midline orientation with posterior bias Heel Shin Test: symmetrical and WFL although pt reports more difficulty with L LE (likely due to paresis) Motor  Motor Motor: Hemiplegia;Abnormal postural alignment and control Motor - Skilled Clinical Observations: mild left hemiparesis   Trunk/Postural Assessment  Cervical Assessment Cervical Assessment: Exceptions to [facility] (forward head) Thoracic Assessment Thoracic Assessment: Exceptions to [facility] (thoracic kyphosis) Lumbar Assessment Lumbar Assessment: Exceptions to [facility] (posterior  pelvic tilt in sitting) Postural Control Postural Control: Deficits on evaluation Trunk Control: Posterior lean/LOB Protective Responses: impaired with poor balance recovery strategies Postural Limitations: decreased  Balance Balance Balance Assessed: Yes Static Sitting Balance Static Sitting - Balance Support: Feet supported Static Sitting - Level of Assistance: 4: Min assist Dynamic Sitting Balance Dynamic Sitting - Balance Support: Feet supported Dynamic Sitting - Level of Assistance: 3: Mod assist Static Standing Balance Static Standing - Balance Support: During functional activity Static Standing - Level of Assistance: 3: Mod assist Dynamic Standing Balance Dynamic Standing - Balance Support: During functional activity Dynamic Standing - Level of Assistance: 2: Max assist Extremity Assessment      RLE Assessment RLE Assessment: Exceptions to [facility] Active Range of Motion (AROM) Comments: WFL General Strength Comments: limited resistance applied to ankle due to pain RLE Strength Right Hip Flexion: 3/5 Right Knee Flexion: 4-/5 Right Knee Extension: 4-/5 Right Ankle Dorsiflexion: 3/5 Right Ankle Plantar Flexion: 3/5 LLE Assessment LLE Assessment: Exceptions to [facility] Active Range of Motion (AROM) Comments: WFL General Strength Comments: limited resistance applied to ankle due to pain LLE Strength Left Hip Flexion: 3/5 Left Knee Flexion: 3+/5 Left Knee Extension: 3+/5 Left Ankle Dorsiflexion: 3/5 Left Ankle Plantar Flexion: 3/5  Care Tool Care Tool Bed Mobility Roll left and right activity   Roll left and right assist level: Minimal Assistance - Patient > 75%    Sit to lying activity   Sit to lying assist level: Minimal Assistance - Patient > 75%    Lying to sitting edge of bed activity   Lying to sitting edge of bed assist level: Minimal Assistance - Patient > 75%     Care Tool Transfers Sit to stand transfer   Sit to stand assist level: Moderate Assistance -  Patient 50 - 74%    Chair/bed transfer   Chair/bed transfer assist level: Moderate Assistance - Patient 50 - 74%     Psychologist, counselling transfer activity did not occur: Safety/medical concerns        Care Tool Locomotion Ambulation   Assist level: Moderate Assistance - Patient 50 - 74% Assistive device: Hand held assist Max distance: 41f  Walk 10 feet activity   Assist level: Moderate Assistance - Patient - 50 - 74% Assistive device: Hand held assist   Walk 50 feet with 2 turns activity Walk 50 feet with 2 turns activity did not occur: Safety/medical concerns      Walk 150 feet activity Walk 150 feet activity did not occur: N/A (pt does not ambulate this  distance at basline)      Walk 10 feet on uneven surfaces activity Walk 10 feet on uneven surfaces activity did not occur: Safety/medical concerns      Stairs   Assist level: Minimal Assistance - Patient > 75% Stairs assistive device: 2 hand rails Max number of stairs: 1 (6")  Walk up/down 1 step activity   Walk up/down 1 step (curb) assist level: Minimal Assistance - Patient > 75% Walk up/down 1 step or curb assistive device: 2 hand rails Walk up/down 4 steps activity did not occuR: Safety/medical concerns  Walk up/down 4 steps activity      Walk up/down 12 steps activity Walk up/down 12 steps activity did not occur: N/A (pt does not navigate a flight of stairs at baseline)      Pick up small objects from floor Pick up small object from the floor (from standing position) activity did not occur: Safety/medical concerns      Wheelchair Will patient use wheelchair at discharge?: No          Wheel 50 feet with 2 turns activity      Wheel 150 feet activity        Refer to Care Plan for Long Term Goals  SHORT TERM GOAL WEEK 1 PT Short Term Goal 1 (Week 1): Pt will perform supine<>sit with CGA PT Short Term Goal 2 (Week 1): Pt will perform sit<>stand using LRAD with min assist PT Short  Term Goal 3 (Week 1): Pt will perform bed<>chair transfers using LRAD with min assist PT Short Term Goal 4 (Week 1): Pt will ambulate at least 37f using LRAD with min assist  Recommendations for other services: None   Skilled Therapeutic Intervention Pt received supine in bed with RN present for medication administration and pt agreeable to therapy session. Evaluation completed (see details above) with patient education regarding purpose of PT evaluation, PT POC and goals, therapy schedule, weekly team meetings, and other CIR information including safety plan and fall risk safety. Performed the below mobility tasks with specified level of assistance and therapist cuing for proper sequencing to increase pt independence throughout. During gait training with L HHA pt demos severely decreased gait speed with very short, shuffled steps with R lateral trunk lean during L stance phase (likely L hip weakness) and B LE slightly externally rotated throughout. Pt reports severely decreased gait speed at baseline. During stair navigation pt ascended 2 3" height steps forward step-to leading with R LE and then descending backwards leading with LLE - performed same task at 1 6" height step all using B HRs with min assist for lifting/lowering. Pt reports feeling very weak during this task. At end of session pt left seated in w/c with needs in reach and seat belt alarm on.  Mobility Bed Mobility Bed Mobility: Supine to Sit;Sit to Supine Supine to Sit: Minimal Assistance - Patient > 75% Sit to Supine: Minimal Assistance - Patient > 75% Transfers Transfers: Sit to Stand;Stand to Sit;Stand Pivot Transfers Sit to Stand: Minimal Assistance - Patient > 75%;Moderate Assistance - Patient 50-74% Stand to Sit: Minimal Assistance - Patient > 75%;Moderate Assistance - Patient 50-74% Stand Pivot Transfers: Moderate Assistance - Patient 50 - 74% Stand Pivot Transfer Details: Tactile cues for initiation;Tactile cues for  sequencing;Tactile cues for posture;Verbal cues for gait pattern;Verbal cues for technique;Verbal cues for precautions/safety;Manual facilitation for weight shifting;Tactile cues for weight shifting Transfer (Assistive device): 1 person hand held assist Locomotion  Gait Ambulation: Yes Gait Assistance: Moderate Assistance -  Patient 50-74% Gait Distance (Feet): 24 Feet Assistive device: 1 person hand held assist (L HHA) Gait Assistance Details: Tactile cues for sequencing;Tactile cues for weight shifting;Verbal cues for technique;Manual facilitation for weight shifting;Verbal cues for gait pattern;Tactile cues for posture Gait Gait: Yes Gait Pattern: Impaired Gait Pattern: Step-to pattern;Decreased step length - left;Decreased step length - right;Decreased stride length;Narrow base of support;Poor foot clearance - left;Poor foot clearance - right Gait velocity: severely decreased Stairs / Additional Locomotion Stairs: Yes Stairs Assistance: Minimal Assistance - Patient > 75% Stair Management Technique: Two rails Number of Stairs: 1 Height of Stairs: 6 Wheelchair Mobility Wheelchair Mobility: No   Discharge Criteria: Patient will be discharged from PT if patient refuses treatment 3 consecutive times without medical reason, if treatment goals not met, if there is a change in medical status, if patient makes no progress towards goals or if patient is discharged from hospital.  The above assessment, treatment plan, treatment alternatives and goals were discussed and mutually agreed upon: by patient  Tawana Scale , PT, DPT, CSRS  04/27/2020, 7:58 AM

## 2020-04-27 NOTE — Evaluation (Signed)
Occupational Therapy Assessment and Plan  Patient Details  Name: [facility] MRN: [id number] Date of Birth: 10-26-1960  OT Diagnosis: abnormal posture, acute pain, cognitive deficits, hemiplegia affecting non-dominant side, muscle weakness (generalized), pain in joint and swelling of limb Rehab Potential: Rehab Potential (ACUTE ONLY): Good ELOS: 14-16 days   Today's Date: 04/27/2020 OT Individual Time: 4010-2725 OT Individual Time Calculation (min): 62 min     Hospital Problem: Principal Problem:   Right middle cerebral artery stroke [facility])   Past Medical History:  Past Medical History:  Diagnosis Date  . Anemia 02/09/2020  . Anxiety 10/28/2019  . Depressed left ventricular ejection fraction 03/13/2020  . Depressive disorder 10/28/2019  . DOE (dyspnea on exertion) 03/13/2020  . Hypothyroidism 02/09/2020  . Iron deficiency anemia 01/24/2020  . Metabolic syndrome 36/64/4034  . Multinodular goiter 03/07/2015  . PAC (premature atrial contraction) 03/13/2020  . PAT (paroxysmal atrial tachycardia) ([place]) 03/13/2020  . Pericardial effusion 02/09/2020  . 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 Seropositive rheumatoid arthritis ([place]) 02/09/2020  . 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 Type 2 diabetes mellitus ([place]) 05/08/2019  . Vitamin D deficiency 02/09/2020   Past Surgical History:  Past Surgical History:  Procedure Laterality Date  . BUBBLE STUDY  04/24/2020   Procedure: BUBBLE STUDY;  Surgeon: Freada Bergeron, MD;  Location: [place];  Service: Cardiovascular;;  . CESAREAN SECTION    . CHOLECYSTECTOMY    . other     Growth removal on chest  . TEE WITHOUT CARDIOVERSION N/A 04/24/2020   Procedure: TRANSESOPHAGEAL ECHOCARDIOGRAM (TEE);  Surgeon: Freada Bergeron, MD;  Location[facility];  Service: Cardiovascular;  Laterality: N/A;  . TOTAL SHOULDER ARTHROPLASTY Left     Assessment & Plan Clinical Impression: Patient is a 60 y.o. year old female  with recent admission to the hospital on 04/22/2018 with progressive left hemiparesis over 1 week resulting in 3 falls.  Admission chemistries glucose 173, AST 65, ALT 62, alkaline phosphatase, alcohol 162, negative hemoglobin A1c 6.1.  Cranial CT unremarkable for acute intracranial process.  Patient did not receive TPA.  MRI showed acute infarction affecting the right mid posterior caudate and right superior thalamus.  Single separate punctate acute infarct in the cortex of the medial right parieto-occipital junction.  Few small punctate acute infarcts in the right occipital cortex.   Patient transferred to CIR on 04/26/2020 .    Patient currently requires max with basic self-care skills secondary to muscle weakness and muscle joint tightness, impaired timing and sequencing, unbalanced muscle activation and decreased coordination, decreased problem solving and decreased standing balance, decreased postural control, hemiplegia and decreased balance strategies.  Prior to hospitalization, patient could complete ADLs with min.  Patient will benefit from skilled intervention to decrease level of assist with basic self-care skills and increase independence with basic self-care skills prior to discharge home with care partner.  Anticipate patient will require minimal physical assistance and follow up home health.  OT - End of Session Activity Tolerance: Decreased this session Endurance Deficit: Yes OT Assessment Rehab Potential (ACUTE ONLY): Good OT Barriers to Discharge: Decreased caregiver support OT Barriers to Discharge Comments: Husband with CVA last year, uses a cane at times. OT Patient demonstrates impairments in the following area(s): Balance;Motor;Pain;Safety OT Basic ADL's Functional Problem(s): Eating;Grooming;Bathing;Dressing;Toileting OT Advanced ADL's Functional Problem(s): Simple Meal Preparation OT Transfers Functional Problem(s): Toilet;Tub/Shower OT Additional Impairment(s): Fuctional Use  of Upper Extremity OT Plan OT Intensity: Minimum of 1-2 x/day, 45 to 90 minutes OT Frequency:  5 out of 7 days OT Duration/Estimated Length of Stay: 14-16 days OT Treatment/Interventions: Balance/vestibular training;Cognitive remediation/compensation;DME/adaptive equipment instruction;Disease mangement/prevention;Discharge planning;Pain management;Self Care/advanced ADL retraining;Therapeutic Activities;UE/LE Coordination activities;Therapeutic Exercise;Patient/family education;Neuromuscular re-education;Splinting/orthotics;UE/LE Strength taining/ROM;Wheelchair propulsion/positioning;Functional mobility training;Community reintegration OT Self Feeding Anticipated Outcome(s): modified independent OT Basic Self-Care Anticipated Outcome(s): min assist OT Toileting Anticipated Outcome(s): min guard assist OT Bathroom Transfers Anticipated Outcome(s): min guard assist OT Recommendation Patient destination: Home Follow Up Recommendations: 24 hour supervision/assistance Equipment Recommended: To be determined   OT Evaluation Precautions/Restrictions  Precautions Precautions: Fall;Other (comment) Precaution Comments: EF 20-25%, L hemiparesis, RA (hands, shoulders, and ankles) Restrictions Weight Bearing Restrictions: No  Pain  See Pain Flowsheet for details  Home Living/Prior [place] expects to be discharged to:: Private residence Living Arrangements: Spouse/significant other Available Help at Discharge: Family,Available PRN/intermittently Type of Home: House Home Access: Stairs to enter [employer] of Steps: 1step-up on to porch and 2 steps up into house Entrance Stairs-Rails: Left (on both) Home Layout: One level Bathroom Shower/Tub: Chiropodist: Standard  Lives With: [employer] IADL History Homemaking Responsibilities: Yes Meal Prep Responsibility: Building surveyor Responsibility: Secondary Cleaning Responsibility:  Secondary Homemaking Comments: Husband and spouse share homemanagement duties Current License: Yes Education: college degree in teaching Prior Function Level of Independence: Other (comment),Independent with gait,Independent with transfers (reports needed some assistance with tasks such as buttoning shirts and opening jars due to arthritis)  Able to Take Stairs?: Yes Driving: Yes Vocation: Retired Leisure: Hobbies-yes (Comment) Comments: loves reading Vision Baseline Vision/History: No visual deficits Patient Visual Report: No change from baseline Vision Assessment?: Yes Ocular Range of Motion: Within Functional Limits Alignment/Gaze Preference: Within Defined Limits Tracking/Visual Pursuits: Able to track stimulus in all quads without difficulty Convergence: Within functional limits Visual Fields: No apparent deficits Perception  Perception: Within Functional Limits Praxis Praxis: Intact Cognition Overall Cognitive Status: Impaired/Different from baseline Arousal/Alertness: Awake/alert Orientation Level: Person;Place;Situation Person: Oriented Place: Oriented Situation: Oriented Year: 2022 Month: January Day of Week: Correct Memory: Impaired Memory Impairment: Decreased recall of new information;Decreased short term memory Decreased Short Term Memory: Functional complex Immediate Memory Recall: Sock;Blue;Bed Memory Recall Sock: Without Cue Memory Recall Blue: With Cue Memory Recall Bed: Without Cue Attention: Focused;Selective Focused Attention: Appears intact Sustained Attention: Appears intact Safety/Judgment: Appears intact Sensation Sensation Light Touch: Appears Intact Proprioception: Not tested Stereognosis: Not tested Coordination Gross Motor Movements are Fluid and Coordinated: No Fine Motor Movements are Fluid and Coordinated: No Coordination and Movement Description: Pt with BUE RA which limits UE coordination from shoulder to fingers. Increased edema  noted in the hands as well.  She uses them functionally at a diminshed level for selfcare tasks overall, however she is weaker on the left side and did exhibit decreased ability to manipulate the washcloth. Motor  Motor Motor: Hemiplegia Motor - Skilled Clinical Observations: mild left hemiparesis  Trunk/Postural Assessment  Cervical Assessment Cervical Assessment: Exceptions to [facility] (forward head) Thoracic Assessment Thoracic Assessment: Exceptions to [facility] (thoracic kyphosis) Lumbar Assessment Lumbar Assessment: Exceptions to [facility] (posterior pelvic tilt) Postural Control Postural Control: Deficits on evaluation Trunk Control: Posterior LOB when sitting in the wheelchair during selfcare tasks.  Balance Balance Balance Assessed: Yes Static Sitting Balance Static Sitting - Balance Support: Feet supported Static Sitting - Level of Assistance: 4: Min assist Dynamic Sitting Balance Dynamic Sitting - Balance Support: Feet unsupported;During functional activity Dynamic Sitting - Level of Assistance: 3: Mod assist Static Standing Balance Static Standing - Balance Support: During functional activity Static Standing - Level  of Assistance: 3: Mod assist Dynamic Standing Balance Dynamic Standing - Balance Support: During functional activity Dynamic Standing - Level of Assistance: 2: Max assist Extremity/Trunk Assessment RUE Assessment RUE Assessment: Exceptions to [facility] Passive Range of Motion (PROM) Comments: Shoulder flexion 0-90 degrees with end range pain and limitations secondary to RA Active Range of Motion (AROM) Comments: Shoulder flexion to approximately 80 degrees, elbow flexion 0-110 degrees. Gross grasp and release 90% of normal ROM at MP's but limted at PIP and DIPs General Strength Comments: 3/5 for shoulder flexion, 2+/5 elbow flexion/extension, grasp 3/5 as well.  Decreased FM coordination secondary to swelling in the hands and digits as well as degenerative changes with  arthritis. LUE Assessment LUE Assessment: Exceptions to [facility] Passive Range of Motion (PROM) Comments: shoulder flexion 0-150 degrees with elbow flexion 0-120 degrees Active Range of Motion (AROM) Comments: shoulder flexion 0-80 degrees with elbow flexion 0-120 degrees.  Gross grasp and release present to 90% of normal ROM at MPs but limited at [facility] and DIPs.  increased adduction of digits as well secondary to degenerative changes. General Strength Comments: shoulder 2+/5, elbow flexion/extension 3/5, grasp 3/5  Decreased FM coordination noted secondary to increased edema and degenerative changes.  Care Tool Care Tool Self Care Eating   Eating Assist Level: Set up assist    Oral Care    Oral Care Assist Level: Supervision/Verbal cueing    Bathing   Body parts bathed by patient: Right arm;Left arm;Chest;Abdomen;Front perineal area;Right upper leg;Left upper leg;Face Body parts bathed by helper: Buttocks;Left lower leg;Right lower leg   Assist Level: Moderate Assistance - Patient 50 - 74%    Upper Body Dressing(including orthotics)   What is the patient wearing?: Pull over shirt   Assist Level: Maximal Assistance - Patient 25 - 49%    Lower Body Dressing (excluding footwear)   What is the patient wearing?: Incontinence brief;Pants Assist for lower body dressing: Maximal Assistance - Patient 25 - 49%    Putting on/Taking off footwear   What is the patient wearing?: Non-skid slipper socks;Ted hose Assist for footwear: Dependent - Patient 0%       Care Tool Toileting Toileting activity   Assist for toileting: Moderate Assistance - Patient 50 - 74%     Care Tool Bed Mobility Roll left and right activity        Sit to lying activity        Lying to sitting edge of bed activity         Care Tool Transfers Sit to stand transfer   Sit to stand assist level: Moderate Assistance - Patient 50 - 74%    Chair/bed transfer         Toilet transfer         Care Tool  Cognition Expression of Ideas and Wants Expression of Ideas and Wants: Without difficulty (complex and basic) - expresses complex messages without difficulty and with speech that is clear and easy to understand   Understanding Verbal and Non-Verbal Content Understanding Verbal and Non-Verbal Content: Usually understands - understands most conversations, but misses some part/intent of message. Requires cues at times to understand   Memory/Recall Ability *first 3 days only      Refer to Care Plan for Long Term Goals  SHORT TERM GOAL WEEK 1 OT Short Term Goal 1 (Week 1): Pt will complete UB dressing with min assist to donn pullover shirt. OT Short Term Goal 2 (Week 1): Pt will complete toilet transfer with min assist  stand pivot. OT Short Term Goal 3 (Week 1): Pt will complete LB dressing with min assist for donning brief and pants only sit to stand.  Recommendations for other services: None    Skilled Therapeutic Intervention ADL ADL Eating: Set up (per pt she needs assist with cutting up food) Where Assessed-Eating: Wheelchair Grooming: Minimal assistance Where Assessed-Grooming: Sitting at sink Upper Body Bathing: Minimal assistance Where Assessed-Upper Body Bathing: Wheelchair;Sitting at sink Lower Body Bathing: Moderate assistance Where Assessed-Lower Body Bathing: Wheelchair;Standing at sink;Sitting at sink Upper Body Dressing: Maximal assistance Where Assessed-Upper Body Dressing: Standing at sink;Sitting at sink Lower Body Dressing: Maximal assistance Where Assessed-Lower Body Dressing: Wheelchair;Standing at sink;Sitting at sink Toileting: Moderate assistance Where Assessed-Toileting: Bedside Commode Toilet Transfer: Moderate assistance Toilet Transfer Method: Stand pivot Science writer: Radiographer, therapeutic: Not assessed Social research officer, government: Not assessed Mobility  Transfers Sit to Stand: Minimal Assistance - Patient > 75% Stand to Sit:  Minimal Assistance - Patient > 75%   Session Note:  Pt in wheelchair to start session, receiving pain meds secondary to increased pain in her feet from the RA.  She was agreeable to participate in selfcare tasks at the sink.  Increased difficulty noted trying to open her soap bottle as well as when attempting to squeeze out the washcloth, secondary to RA and LUE weakness greater than baseline.  She demonstrated decreased ability to reach her feet for doffing and donning her gripper socks.  She reports that at home her spouse had to assist with this.  Therapist assisted with this as well as for washing her feet.  Min assist for sit to stand with mod assist to maintain standing balance while working on peri washing.  Decreased thoroughness noted when attempting to wash her buttocks secondary to LUE weakness.  She reports having a total shoulder replacement in the impaired left shoulder previously with plans to have the right shoulder done.  However this has been delayed secondary to her  spouse's stroke and now her having a stroke as well.  Increased knee buckling also noted on the left after standing for approximately 1 minute. She needed mod assist for completion of a few steps toward the bathroom door and then back to her wheelchair without an assistive device to conclude session.  Call button and phone in reach with safety belt in place.     Discharge Criteria: Patient will be discharged from OT if patient refuses treatment 3 consecutive times without medical reason, if treatment goals not met, if there is a change in medical status, if patient makes no progress towards goals or if patient is discharged from hospital.  The above assessment, treatment plan, treatment alternatives and goals were discussed and mutually agreed upon: by patient  Geniya Fulgham OTR/L 04/27/2020, 5:19 PM

## 2020-04-28 DIAGNOSIS — I63511 Cerebral infarction due to unspecified occlusion or stenosis of right middle cerebral artery: Secondary | ICD-10-CM | POA: Diagnosis not present

## 2020-04-28 LAB — GLUCOSE, CAPILLARY
Glucose-Capillary: 161 mg/dL — ABNORMAL HIGH (ref 70–99)
Glucose-Capillary: 132 mg/dL — ABNORMAL HIGH (ref 70–99)
Glucose-Capillary: 242 mg/dL — ABNORMAL HIGH (ref 70–99)
Glucose-Capillary: 219 mg/dL — ABNORMAL HIGH (ref 70–99)

## 2020-04-28 MED ORDER — METFORMIN HCL ER 500 MG PO TB24
500.0000 mg | ORAL_TABLET | Freq: Every day | ORAL | Status: DC
  Administered 2020-04-29 – 2020-05-04 (×6): 500 mg via ORAL
  Filled 2020-04-28 (×7): qty 1

## 2020-04-28 NOTE — Progress Notes (Signed)
[facility] PHYSICAL MEDICINE & REHABILITATION PROGRESS NOTE   Subjective/Complaints:  Less joint pain this am , discussed increased dose of prednisone as well as increased CBG and need for Metformin     Review of systems denies chest pain shortness of breath nausea vomiting diarrhea or constipation  Objective:   No results found. No results for input(s): WBC, HGB, HCT, PLT in the last 72 hours. Recent Labs    04/25/20 1710 04/25/20 2036 04/26/20 0550  NA 135  --  138  K >7.5* 4.8 4.1  CL 102  --  98  CO2 14*  --  27  GLUCOSE 130*  --  102*  BUN 14  --  10  CREATININE 0.66  --  0.61  CALCIUM 8.6*  --  8.7*    Intake/Output Summary (Last 24 hours) at 04/28/2020 1033 Last data filed at 04/28/2020 0700 Gross per 24 hour  Intake 600 ml  Output -  Net 600 ml        Physical Exam: Vital Signs Blood pressure 115/87, pulse 86, temperature 98.3 F (36.8 C), resp. rate 15, height 5' (1.524 m), weight 56.6 kg, SpO2 99 %.   General: No acute distress Mood and affect are appropriate Heart: Regular rate and rhythm no rubs murmurs or extra sounds Lungs: Clear to auscultation, breathing unlabored, no rales or wheezes Abdomen: Positive bowel sounds, soft nontender to palpation, nondistended Extremities: No clubbing, cyanosis, or edema Skin: No evidence of breakdown, no evidence of rash  Neurologic: Cranial nerves II through XII intact, motor strength is 4/5 in bilateral deltoid, bicep, tricep, grip, hip flexor, knee extensors, ankle dorsiflexor and plantar flexor, patient is limited with shoulder range of motion  Musculoskeletal: Bilateral shoulder abduction and forward flexion is 75% of normal pain and swelling in digits 2 3 bilaterally at the MCPs, tenderness over the metatarsal phalangeal joints as well   Assessment/Plan: 1. Functional deficits which require 3+ hours per day of interdisciplinary therapy in a comprehensive inpatient rehab setting.  Physiatrist is  providing close team supervision and 24 hour management of active medical problems listed below.  Physiatrist and rehab team continue to assess barriers to discharge/monitor patient progress toward functional and medical goals  Care Tool:  Bathing    Body parts bathed by patient: Right arm,Left arm,Chest,Abdomen,Front perineal area,Right upper leg,Left upper leg,Face   Body parts bathed by helper: Buttocks,Left lower leg,Right lower leg     Bathing assist Assist Level: Moderate Assistance - Patient 50 - 74%     Upper Body Dressing/Undressing Upper body dressing   What is the patient wearing?: Hospital gown only    Upper body assist Assist Level: Maximal Assistance - Patient 25 - 49%    Lower Body Dressing/Undressing Lower body dressing      What is the patient wearing?: Incontinence brief     Lower body assist Assist for lower body dressing: Maximal Assistance - Patient 25 - 49%     Toileting Toileting    Toileting assist Assist for toileting: Moderate Assistance - Patient 50 - 74%     Transfers Chair/bed transfer  Transfers assist     Chair/bed transfer assist level: Moderate Assistance - Patient 50 - 74%     Locomotion Ambulation   Ambulation assist      Assist level: Moderate Assistance - Patient 50 - 74% Assistive device: Hand held assist Max distance: 88ft   Walk 10 feet activity   Assist     Assist level: Moderate Assistance -  Patient - 50 - 74% Assistive device: Hand held assist   Walk 50 feet activity   Assist Walk 50 feet with 2 turns activity did not occur: Safety/medical concerns         Walk 150 feet activity   Assist Walk 150 feet activity did not occur: N/A (pt does not ambulate this distance at basline)         Walk 10 feet on uneven surface  activity   Assist Walk 10 feet on uneven surfaces activity did not occur: Safety/medical concerns         Wheelchair     Assist Will patient use wheelchair at  discharge?: No             Wheelchair 50 feet with 2 turns activity    Assist            Wheelchair 150 feet activity     Assist          Blood pressure 115/87, pulse 86, temperature 98.3 F (36.8 C), resp. rate 15, height 5' (1.524 m), weight 56.6 kg, SpO2 99 %.   Medical Problem List and Plan: 1.  Left hemiparesis secondary to right MCA/PCA, thalamus, right PLIC and CR scattered small punctate infarcts             -patient may shower as tolerated             -ELOS/Goals: 14-18 days/supervision/min a           COnt PT, OT CIR 2.  Antithrombotics: -DVT/anticoagulation: Eliquis 5 mg twice daily.  Lower extremity Dopplers negative             -antiplatelet therapy: Aspirin 81 mg daily 3. Pain Management: Hydrocodone as needed             Monitor with increased exertion 4. Mood: Cymbalta 60 mg daily, Wellbutrin 150 mg daily, Ativan 0.5 mg every 8 hours as needed             -antipsychotic agents: N/A 5. Neuropsych: This patient is capable of making decisions on her own behalf. 6. Skin/Wound Care: Routine skin checks 7. Fluids/Electrolytes/Nutrition: Routine in and outs.  CMP ordered 8.  History of PAF/CAD/PFO.  30-day cardiac event monitor per cardiology services Dr.Kardie Tobb.  Continue Eliquis for now.  If EF improves and no atrial fibrillation in 3 months plan to stop Eliquis and restart Plavix.             Monitor with increased activity 9.  Transaminitis.  Follow-up gastroenterology service Dr. Marina Goodell.               Autoimmune markers pending             CMP ordered 10.  Hyperlipidemia.  Crestor on hold due to elevated LFTs. 11.  Rheumatoid arthritis: Low-dose prednisone as well as weekly Orencia            Having rheumatoid flare will increase prednisone 12.  Diabetes mellitus.  Hemoglobin A1c 6.1.  SSI.  Patient on Metformin 500 mg daily prior to admission.  Resume as needed          CBG (last 3)  Recent Labs    04/27/20 1628 04/27/20 2045  04/28/20 0547  GLUCAP 203* 186* 161*  elevated likely due to higher dose prednisone , resume low dose am metformin   13.  Hypothyroidism: Synthroid 14.  Essential hypertension.. Blood pressure had remained a bit soft Toprol-XL resumed at 12.5 mg daily  04/26/2020           Vitals:   04/27/20 1925 04/28/20 0314  BP: 97/80 115/87  Pulse: 83 86  Resp: 15 15  Temp: 98.2 F (36.8 C) 98.3 F (36.8 C)  SpO2: 99% 99%    15.  Constipation but multiple stools post sorbitol 1/29  LOS: 2 days A FACE TO FACE EVALUATION WAS PERFORMED  Erick Colace 04/28/2020, 10:33 AM

## 2020-04-28 NOTE — Evaluation (Signed)
Speech Language Pathology Assessment and Plan  Patient Details  Name: Madison Hurst MRN: [id number] Date of Birth: [birth year]  SLP Diagnosis: Cognitive Impairments  Rehab Potential: Good ELOS: 14-16 days    Today's Date: 04/28/2020 SLP Individual Time: 3016-0109 SLP Individual Time Calculation (min): 20 min   Hospital Problem: Principal Problem:   Right middle cerebral artery stroke [facility])  Past Medical History:  Past Medical History:  Diagnosis Date  . Anemia 02/09/2020  . Anxiety 10/28/2019  . Depressed left ventricular ejection fraction 03/13/2020  . Depressive disorder 10/28/2019  . DOE (dyspnea on exertion) 03/13/2020  . Hypothyroidism 02/09/2020  . Iron deficiency anemia 01/24/2020  . Metabolic syndrome 32/35/5732  . Multinodular goiter 03/07/2015  . PAC (premature atrial contraction) 03/13/2020  . PAT (paroxysmal atrial tachycardia) ([place]) 03/13/2020  . Pericardial effusion 02/09/2020  . 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 Seropositive rheumatoid arthritis ([place]) 02/09/2020  . 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 Type 2 diabetes mellitus ([place]) 05/08/2019  . Vitamin D deficiency 02/09/2020   Past Surgical History:  Past Surgical History:  Procedure Laterality Date  . BUBBLE STUDY  04/24/2020   Procedure: BUBBLE STUDY;  Surgeon: Freada Bergeron, MD;  Location: [place];  Service: Cardiovascular;;  . CESAREAN SECTION    . CHOLECYSTECTOMY    . other     Growth removal on chest  . TEE WITHOUT CARDIOVERSION N/A 04/24/2020   Procedure: TRANSESOPHAGEAL ECHOCARDIOGRAM (TEE);  Surgeon: Freada Bergeron, MD;  Location: [facility] ENDOSCOPY;  Service: Cardiovascular;  Laterality: N/A;  . TOTAL SHOULDER ARTHROPLASTY Left     Assessment / Plan / Recommendation Clinical Impression   Madison Hurst is a 60 year old right-handed female with history of paroxysmal atrial tachycardia followed by Dr.Kardie Tobb, CAD, hypertension, diabetes  mellitus, chronic anemia, hyperlipidemia, rheumatoid arthritis maintained on chronic prednisone, anxiety and depression.  History taken from chart review and patient.  Patient lives with spouse.  1 level home 2 steps to entry.  Patient reports ambulating independently but with poor balance.  She presented on 04/22/2018 with progressive left hemiparesis over 1 week resulting in 3 falls.  Admission chemistries glucose 173, AST 65, ALT 62, alkaline phosphatase, alcohol 162, negative hemoglobin A1c 6.1.  Cranial CT unremarkable for acute intracranial process.  Patient did not receive TPA.  MRI showed acute infarction affecting the right mid posterior caudate and right superior thalamus.  Single separate punctate acute infarct in the cortex of the medial right parieto-occipital junction.  Few small punctate acute infarcts in the right occipital cortex.  No evidence of hemorrhage or mass-effect.  CT angiogram of head and neck mild stenosis of the left PCA P3 segment.  No emergent large vessel occlusion.  MRI cervical spine showed apparent moderate to severe cervical spinal stenosis at C4-C5 and C5-C6, and moderate at C3-4.  Multilevel neuroforaminal narrowing greatest at C3-4 and on the right C4-5.  Echocardiogram with ejection fraction of 20-25%, with left ventricle global hypokinesis. TEE showed mild to moderate MR.  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  No LAA thrombus.  Lower extremity Dopplers negative.  Orders were given for a video monitor to further evaluate etiology of her CVA.  Currently maintained on aspirin 81 mg daily as well as Eliquis.  LFTs have continued to be elevated and monitored.  Acute hepatitis panel negative.  Her Crestor was discontinued due to elevated LFTs.  Ultrasound of the abdomen showed no focal lesion identified.  Gallbladder was absent.  Study otherwise unremarkable.  Autoimmune markers are pending.  There is some question of possible need for liver biopsy however felt to be  somewhat risky while maintained on Eliquis gastroenterology services Dr. Henrene Pastor has been consulted to follow rising LFTs.  Due to patient's progressive left hemiparesis, she was admitted for a comprehensive rehab program.  Please see preadmission assessment from earlier today as well.  SLP evaluation was completed on 04/28/2020 with results as follows:  Pt presents with moderate cognitive impairments as supported by a score of 12/30 on the [facility] Mental Status Exam (N>/=27).  Deficits were noted in the areas of recall, attention, emergent awareness and problem solving.  Pt reports that she currently feels "slow and fuzzy" in comparison to her baseline.  As a result, pt would benefit from skilled ST while inpatient in order to maximize functional independence and reduce burden of care prior to discharge.  Anticipate that pt will need 24/7 supervision at discharge in addition to [place] follow up at next level of care.    Skilled Therapeutic Interventions          Cognitive-linguistic evaluation completed with results and recommendations reviewed with patient.     SLP Assessment  Patient will need skilled [place] Pathology Services during CIR admission    Recommendations  Recommendations for Other Services: Neuropsych consult Patient destination: Home Follow up Recommendations: Home Health SLP;24 hour supervision/assistance Equipment Recommended: None recommended by SLP    SLP Frequency 3 to 5 out of 7 days   SLP Duration  SLP Intensity  SLP Treatment/Interventions 14-16 days  Minumum of 1-2 x/day, 30 to 90 minutes  Cognitive remediation/compensation;Cueing hierarchy;Environmental controls;Functional tasks;Patient/family education;Internal/external aids    Pain Pain Assessment Pain Scale: 0-10 Pain Score: 0-No pain  Prior Functioning Cognitive/Linguistic Baseline: Within functional limits Type of Home: House  Lives With: Spouse;Son Available Help at Discharge:  Family;Available PRN/intermittently Education: college degree in teaching Vocation: Retired  SLP Evaluation Cognition Overall Cognitive Status: Impaired/Different from baseline Arousal/Alertness: Awake/alert Orientation Level: Oriented X4 Attention: Selective Selective Attention: Impaired Selective Attention Impairment: Functional basic Memory: Impaired Memory Impairment: Decreased recall of new information;Retrieval deficit Awareness: Impaired Awareness Impairment: Emergent impairment Problem Solving: Impaired Problem Solving Impairment: Functional basic Safety/Judgment: Appears intact  Comprehension Auditory Comprehension Overall Auditory Comprehension: Appears within functional limits for tasks assessed Expression Expression Primary Mode of Expression: Verbal Verbal Expression Overall Verbal Expression: Appears within functional limits for tasks assessed Oral Motor Oral Motor/Sensory Function Overall Oral Motor/Sensory Function: Within functional limits Motor Speech Overall Motor Speech: Appears within functional limits for tasks assessed  Care Tool Care Tool Cognition Expression of Ideas and Wants Expression of Ideas and Wants: Without difficulty (complex and basic) - expresses complex messages without difficulty and with speech that is clear and easy to understand   Understanding Verbal and Non-Verbal Content Understanding Verbal and Non-Verbal Content: Usually understands - understands most conversations, but misses some part/intent of message. Requires cues at times to understand   Memory/Recall Ability *first 3 days only Memory/Recall Ability *first 3 days only: Current season;That he or she is in a hospital/hospital unit;Staff names and faces      Short Term Goals: Week 1: SLP Short Term Goal 1 (Week 1): Pt will utilize external memory aids to facilitate recall of daily information with min assist verbal cues SLP Short Term Goal 2 (Week 1): Pt will complete  mildly complex functional tasks wtih min assist verbal cues. SLP Short Term Goal 3 (Week 1): Pt will recognize and correct errors in  the moment when completing mildly complex tasks with min assist verbal cues. SLP Short Term Goal 4 (Week 1): Pt will selectively attend to functional tasks in a mildly distracting environment for 10 minutes with supervision verbal cues for redirection.  Refer to Care Plan for Long Term Goals  Recommendations for other services: Neuropsych  Discharge Criteria: Patient will be discharged from SLP if patient refuses treatment 3 consecutive times without medical reason, if treatment goals not met, if there is a change in medical status, if patient makes no progress towards goals or if patient is discharged from hospital.  The above assessment, treatment plan, treatment alternatives and goals were discussed and mutually agreed upon: by patient  Emilio Math 04/28/2020, 12:57 PM

## 2020-04-29 DIAGNOSIS — I63511 Cerebral infarction due to unspecified occlusion or stenosis of right middle cerebral artery: Secondary | ICD-10-CM | POA: Diagnosis not present

## 2020-04-29 LAB — COMPREHENSIVE METABOLIC PANEL
ALT: 334 U/L — ABNORMAL HIGH (ref 0–44)
AST: 83 U/L — ABNORMAL HIGH (ref 15–41)
Albumin: 2.9 g/dL — ABNORMAL LOW (ref 3.5–5.0)
Alkaline Phosphatase: 346 U/L — ABNORMAL HIGH (ref 38–126)
Anion gap: 12 (ref 5–15)
BUN: 14 mg/dL (ref 6–20)
CO2: 30 mmol/L (ref 22–32)
Calcium: 9.6 mg/dL (ref 8.9–10.3)
Chloride: 96 mmol/L — ABNORMAL LOW (ref 98–111)
Creatinine, Ser: 0.61 mg/dL (ref 0.44–1.00)
GFR, Estimated: >60 mL/min (ref >60)
Glucose, Bld: 132 mg/dL — ABNORMAL HIGH (ref 70–99)
Potassium: 3.9 mmol/L (ref 3.5–5.1)
Sodium: 138 mmol/L (ref 135–145)
Total Bilirubin: 0.8 mg/dL (ref 0.3–1.2)
Total Protein: 5.9 g/dL — ABNORMAL LOW (ref 6.5–8.1)

## 2020-04-29 LAB — CBC WITH DIFFERENTIAL/PLATELET
Abs Immature Granulocytes: 0.06 10*3/uL (ref 0.00–0.07)
Basophils Absolute: 0 10*3/uL (ref 0.0–0.1)
Basophils Relative: 0 %
Eosinophils Absolute: 0.1 10*3/uL (ref 0.0–0.5)
Eosinophils Relative: 1 %
HCT: 47.5 % — ABNORMAL HIGH (ref 36.0–46.0)
Hemoglobin: 14.6 g/dL (ref 12.0–15.0)
Immature Granulocytes: 1 %
Lymphocytes Relative: 14 %
Lymphs Abs: 1.5 10*3/uL (ref 0.7–4.0)
MCH: 31.8 pg (ref 26.0–34.0)
MCHC: 30.7 g/dL (ref 30.0–36.0)
MCV: 103.5 fL — ABNORMAL HIGH (ref 80.0–100.0)
Monocytes Absolute: 0.9 10*3/uL (ref 0.1–1.0)
Monocytes Relative: 8 %
Neutro Abs: 7.8 10*3/uL — ABNORMAL HIGH (ref 1.7–7.7)
Neutrophils Relative %: 76 %
Platelets: 222 10*3/uL (ref 150–400)
RBC: 4.59 MIL/uL (ref 3.87–5.11)
RDW: 18.6 % — ABNORMAL HIGH (ref 11.5–15.5)
WBC: 10.4 10*3/uL (ref 4.0–10.5)
nRBC: 0.3 % — ABNORMAL HIGH (ref 0.0–0.2)

## 2020-04-29 LAB — GLUCOSE, CAPILLARY
Glucose-Capillary: 138 mg/dL — ABNORMAL HIGH (ref 70–99)
Glucose-Capillary: 132 mg/dL — ABNORMAL HIGH (ref 70–99)
Glucose-Capillary: 148 mg/dL — ABNORMAL HIGH (ref 70–99)
Glucose-Capillary: 139 mg/dL — ABNORMAL HIGH (ref 70–99)

## 2020-04-29 MED ORDER — MELATONIN 3 MG PO TABS
3.0000 mg | ORAL_TABLET | Freq: Every day | ORAL | Status: DC
  Administered 2020-04-29 – 2020-05-07 (×9): 3 mg via ORAL
  Filled 2020-04-29 (×9): qty 1

## 2020-04-29 NOTE — Progress Notes (Signed)
Inpatient Rehabilitation Care Coordinator Assessment and Plan Patient Details  Name: Madison Hurst MRN: [id number] Date of Birth: 06/19/1960  Today's Date: 04/29/2020  Hospital Problems: Principal Problem:   Right middle cerebral artery stroke [facility])  Past Medical History:  Past Medical History:  Diagnosis Date  . Anemia 02/09/2020  . Anxiety 10/28/2019  . Depressed left ventricular ejection fraction 03/13/2020  . Depressive disorder 10/28/2019  . DOE (dyspnea on exertion) 03/13/2020  . Hypothyroidism 02/09/2020  . Iron deficiency anemia 01/24/2020  . Metabolic syndrome 44/05/4740  . Multinodular goiter 03/07/2015  . PAC (premature atrial contraction) 03/13/2020  . PAT (paroxysmal atrial tachycardia) ([place]) 03/13/2020  . Pericardial effusion 02/09/2020  . 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 Seropositive rheumatoid arthritis ([place]) 02/09/2020  . 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 Type 2 diabetes mellitus ([place]) 05/08/2019  . Vitamin D deficiency 02/09/2020   Past Surgical History:  Past Surgical History:  Procedure Laterality Date  . BUBBLE STUDY  04/24/2020   Procedure: BUBBLE STUDY;  Surgeon: Freada Bergeron, MD;  Location: [place];  Service: Cardiovascular;;  . CESAREAN SECTION    . CHOLECYSTECTOMY    . other     Growth removal on chest  . TEE WITHOUT CARDIOVERSION N/A 04/24/2020   Procedure: TRANSESOPHAGEAL ECHOCARDIOGRAM (TEE);  Surgeon: Freada Bergeron, MD;  Location: [facility] ENDOSCOPY;  Service: Cardiovascular;  Laterality: N/A;  . TOTAL SHOULDER ARTHROPLASTY Left    Social History:  reports that she quit smoking about 26 years ago. She has never used smokeless tobacco. She reports that she does not drink alcohol. No history on file for drug use.  Family / Support Systems Marital Status: Married Patient Roles: Spouse Spouse/Significant Other: Scottie Children: SON (57) Anticipated Caregiver: Husband and son Ability/Limitations of  Caregiver: Interminttent (close to 24/7) Caregiver Availability: 24/7  Social History Preferred language: English Religion:  Read: Yes Write: Yes Employment Status: Unemployed Date Retired/Disabled/Unemployed: N/A Public relations account executive Issues: none Guardian/Conservator: Scottie   Abuse/Neglect Abuse/Neglect Assessment Can Be Completed: Yes Physical Abuse: Denies Verbal Abuse: Denies Sexual Abuse: Denies Exploitation of patient/patient's resources: Denies Self-Neglect: Denies  Emotional Status    Patient / Family Perceptions, Expectations & Goals Pt/Family understanding of illness & functional limitations: yes Premorbid pt/family roles/activities: Independent W/ Amputation, some assistance dressing Anticipated changes in roles/activities/participation: Will need assistance Pt/family expectations/goals: Supervision- [place] Agencies: None Premorbid Home Care/DME Agencies: Other (Comment) (Single Point cane, WC, Shower Seat, BSC) Transportation available at discharge: family able to transport Resource referrals recommended: Neuropsychology (Hx: of anxiety and depression)  Discharge Planning Living Arrangements: Spouse/significant other,Children (Spouse and Son) Support Systems: Spouse/significant other,Children Type of Residence: Private residence (1 level home, 1 step to enter) Administrator, 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 Addressed questions and concerns. Spouse in room by bedside.  Patient plans to discharge home. No additional questions or concerns, sw will continue to follow up.   Dyanne Iha 04/29/2020, 1:03 PM

## 2020-04-29 NOTE — Progress Notes (Signed)
[place] PHYSICAL MEDICINE & REHABILITATION PROGRESS NOTE   Subjective/Complaints: No complaints initially this morning When asked, does not swelling in ankles bilaterally. She has been elevating as much as possible but feels ice and compression garments worsen the swelling.    Review of systems denies chest pain shortness of breath nausea vomiting diarrhea or constipation, + swelling in bilateral ankles  Objective:   No results found. Recent Labs    04/29/20 0554  WBC 10.4  HGB 14.6  HCT 47.5*  PLT 222   Recent Labs    04/29/20 0554  NA 138  K 3.9  CL 96*  CO2 30  GLUCOSE 132*  BUN 14  CREATININE 0.61  CALCIUM 9.6    Intake/Output Summary (Last 24 hours) at 04/29/2020 1221 Last data filed at 04/29/2020 0900 Gross per 24 hour  Intake 520 ml  Output --  Net 520 ml        Physical Exam: Vital Signs Blood pressure 110/86, pulse 88, temperature 97.7 F (36.5 C), resp. rate 16, height 5' (1.524 m), weight 56.6 kg, SpO2 100 %.  Gen: no distress, normal appearing HEENT: oral mucosa pink and moist, NCAT Cardio: Reg rate Chest: normal effort, normal rate of breathing Abd: soft, non-distended Ext: no edema Psych: pleasant, normal affect Skin: intact  Neurologic: Cranial nerves II through XII intact, motor strength is 4/5 in bilateral deltoid, bicep, tricep, grip, hip flexor, knee extensors, ankle dorsiflexor and plantar flexor, patient is limited with shoulder range of motion  Musculoskeletal: Bilateral shoulder abduction and forward flexion is 75% of normal pain and swelling in digits 2 3 bilaterally at the MCPs, tenderness over the metatarsal phalangeal joints as well   Assessment/Plan: 1. Functional deficits which require 3+ hours per day of interdisciplinary therapy in a comprehensive inpatient rehab setting.  Physiatrist is providing close team supervision and 24 hour management of active medical problems listed below.  Physiatrist and rehab team  continue to assess barriers to discharge/monitor patient progress toward functional and medical goals  Care Tool:  Bathing    Body parts bathed by patient: Right arm,Left arm,Chest,Abdomen,Front perineal area,Right upper leg,Left upper leg,Face   Body parts bathed by helper: Buttocks,Left lower leg,Right lower leg     Bathing assist Assist Level: Moderate Assistance - Patient 50 - 74%     Upper Body Dressing/Undressing Upper body dressing   What is the patient wearing?: Hospital gown only    Upper body assist Assist Level: Maximal Assistance - Patient 25 - 49%    Lower Body Dressing/Undressing Lower body dressing      What is the patient wearing?: Incontinence brief     Lower body assist Assist for lower body dressing: Maximal Assistance - Patient 25 - 49%     Toileting Toileting    Toileting assist Assist for toileting: Moderate Assistance - Patient 50 - 74%     Transfers Chair/bed transfer  Transfers assist     Chair/bed transfer assist level: Moderate Assistance - Patient 50 - 74%     Locomotion Ambulation   Ambulation assist      Assist level: Moderate Assistance - Patient 50 - 74% Assistive device: Hand held assist Max distance: 47ft   Walk 10 feet activity   Assist     Assist level: Moderate Assistance - Patient - 50 - 74% Assistive device: Hand held assist   Walk 50 feet activity   Assist Walk 50 feet with 2 turns activity did not occur: Safety/medical concerns  Walk 150 feet activity   Assist Walk 150 feet activity did not occur: N/A (pt does not ambulate this distance at basline)         Walk 10 feet on uneven surface  activity   Assist Walk 10 feet on uneven surfaces activity did not occur: Safety/medical concerns         Wheelchair     Assist Will patient use wheelchair at discharge?: No             Wheelchair 50 feet with 2 turns activity    Assist            Wheelchair 150 feet  activity     Assist          Blood pressure 110/86, pulse 88, temperature 97.7 F (36.5 C), resp. rate 16, height 5' (1.524 m), weight 56.6 kg, SpO2 100 %.   Medical Problem List and Plan: 1.  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  Antithrombotics: -DVT/anticoagulation: Eliquis 5 mg twice daily.  Lower extremity Dopplers negative             -antiplatelet therapy: Aspirin 81 mg daily 3. Pain Management: Continue Hydrocodone as needed             Monitor with increased exertion 4. Mood: Cymbalta 60 mg daily, Wellbutrin 150 mg daily, Ativan 0.5 mg every 8 hours as needed             -antipsychotic agents: N/A 5. Neuropsych: This patient is capable of making decisions on her own behalf. 6. Skin/Wound Care: Routine skin checks 7. Fluids/Electrolytes/Nutrition: Routine in and outs.  CMP ordered 8.  History of PAF/CAD/PFO.  30-day cardiac event monitor per cardiology services Dr.Kardie Tobb.  Continue Eliquis for now.  If EF improves and no atrial fibrillation in 3 months plan to stop Eliquis and restart Plavix.             Monitor with increased activity 9.  Transaminitis.  Follow-up gastroenterology service Dr. Marina Goodell.               Autoimmune markers pending             1/31: AST and ALT reviewed and elevated to 83 and 334 respectively. Both are trending down well, repeat weekly.   10.  Hyperlipidemia.  Crestor on hold due to elevated LFTs. 11.  Rheumatoid arthritis: Low-dose prednisone as well as weekly Orencia            Having rheumatoid flare will increase prednisone 12.  Diabetes mellitus.  Hemoglobin A1c 6.1.  SSI.  Patient on Metformin 500 mg daily prior to admission.  Resume as needed          CBG (last 3)  Recent Labs    04/28/20 2046 04/29/20 0553 04/29/20 1144  GLUCAP  219* 138* 132*  elevated likely due to higher dose prednisone , resume low dose am metformin   13.  Hypothyroidism: Synthroid 14.  Essential hypertension.. Blood pressure had remained a bit soft Toprol-XL resumed at 12.5 mg daily 04/26/2020  1/31: BP well controlled, continue current regimen.  Vitals:   04/29/20 0310 04/29/20 0816  BP: 112/82 110/86  Pulse: 92 88  Resp: 16   Temp: 97.7 F (36.5 C)   SpO2: 100%     15.  Constipation but multiple stools post sorbitol 1/29 16. Insomnia: melatonin 3mg  started HS  LOS: 3 days A FACE TO FACE EVALUATION WAS PERFORMED  P Treina Arscott 04/29/2020, 12:21 PM

## 2020-04-29 NOTE — IPOC Note (Signed)
Overall Plan of Care [facility]) Patient Details Name: Madison Hurst MRN: [id number] DOB: 13-Aug-1960  Admitting Diagnosis: Right middle cerebral artery stroke [facility])  Hospital Problems: Principal Problem:   Right middle cerebral artery stroke (HCC)     Functional Problem List: Nursing Bladder,Medication Management,Motor,Nutrition,Pain,Safety,Sensory,Edema,Endurance  PT Balance,Perception,Safety,Edema,Sensory,Endurance,Skin Integrity,Motor,Nutrition,Pain,Behavior  OT Balance,Motor,Pain,Safety  SLP Cognition  TR         Basic ADL's: OT Eating,Grooming,Bathing,Dressing,Toileting     Advanced  ADL's: OT Simple Meal Preparation     Transfers: PT Bed Mobility,Bed to Chair,Car,Furniture  OT Toilet,Tub/Shower     Locomotion: PT Ambulation,Stairs     Additional Impairments: OT Fuctional Use of Upper Extremity  SLP Social Cognition   Problem Solving,Memory,Attention,Awareness  TR      Anticipated Outcomes Item Anticipated Outcome  Self Feeding modified independent  Swallowing      Basic self-care  min assist  Toileting  min guard assist   Bathroom Transfers min guard assist  Bowel/Bladder  continent of bowel bladder  Transfers  CGA  Locomotion  CGA using LRAD  Communication     Cognition  supervision  Pain  pain less than or equal to 2  Safety/Judgment  min assist   Therapy Plan: PT Intensity: Minimum of 1-2 x/day ,45 to 90 minutes PT Frequency: 5 out of 7 days PT Duration Estimated Length of Stay: ~2-2.5 weeks OT Intensity: Minimum of 1-2 x/day, 45 to 90 minutes OT Frequency: 5 out of 7 days OT Duration/Estimated Length of Stay: 14-16 days SLP Intensity: Minumum of 1-2 x/day, 30 to 90 minutes SLP Frequency: 3 to 5 out of 7 days SLP Duration/Estimated Length of Stay: 14-16 days   Due to the current state of emergency, patients may not be receiving their 3-hours of Medicare-mandated therapy.   Team Interventions: Nursing Interventions  Patient/Family Education,Bladder Management,Pain Management,Disease Management/Prevention,Cognitive Remediation/Compensation,Discharge Planning  PT interventions Ambulation/gait training,Community reintegration,DME/adaptive equipment instruction,Neuromuscular re-education,Psychosocial support,Stair training,UE/LE Strength taining/ROM,Balance/vestibular training,Discharge planning,Functional electrical stimulation,Pain management,Skin care/wound management,Therapeutic Activities,UE/LE Coordination activities,Cognitive remediation/compensation,Disease management/prevention,Functional mobility training,Patient/family education,Visual/perceptual remediation/compensation,Therapeutic Exercise,Splinting/orthotics  OT Interventions Balance/vestibular training,Cognitive remediation/compensation,DME/adaptive equipment instruction,Disease mangement/prevention,Discharge planning,Pain management,Self Care/advanced ADL retraining,Therapeutic Activities,UE/LE Coordination activities,Therapeutic Exercise,Patient/family education,Neuromuscular re-education,Splinting/orthotics,UE/LE Strength taining/ROM,Wheelchair propulsion/positioning,Functional mobility training,Community reintegration  SLP Interventions Cognitive remediation/compensation,Cueing hierarchy,Environmental controls,Functional tasks,Patient/family education,Internal/external aids  TR Interventions    SW/CM Interventions     Barriers to Discharge MD  Medical stability  Nursing Incontinence    PT Decreased caregiver support,Inaccessible home environment,Other (comments),Lack of/limited family support RA  OT Decreased caregiver support Husband with CVA last year, uses a cane at times.  SLP      SW       Team Discharge Planning: Destination: PT-Home ,OT- Home , SLP-Home Projected Follow-up: PT-Home health PT,24 hour supervision/assistance, OT-  24 hour supervision/assistance, SLP-Home Health SLP,24 hour supervision/assistance Projected Equipment  Needs: PT-To be determined, OT- To be determined, SLP-None recommended by SLP Equipment Details: PT- , OT-  Patient/family involved in discharge planning: PT- Patient,  OT-Patient, SLP-Patient  MD ELOS: 14-18 days S/MinA Medical Rehab Prognosis:  Excellent Assessment: Mrs. Schlosser is a 60 year old woman admitted to CIR with left hemiparesis secondary to right MCA/PCA, thalamus, right PLIC and CR scattered small punctate infarcts. She is maintained on Eliquis 5mg  BID for anticoagulation and HR is being monitored TID. She is receiving hydrocodone as needed for pain control. Other medical issues being monitored and treated include transaminitis, HLD, rheumatoid arthritis, diabetes mellitus, hypothyroidism, essential hypertension, and constipation.   See Team Conference Notes for weekly updates to the plan  of care

## 2020-04-29 NOTE — Progress Notes (Signed)
Inpatient Rehabilitation  Patient information reviewed and entered into eRehab system by Sudie Bandel M. Anil Havard, M.A., CCC/SLP, PPS Coordinator.  Information including medical coding, functional ability and quality indicators will be reviewed and updated through discharge.    

## 2020-04-29 NOTE — Progress Notes (Signed)
Occupational Therapy Session Note  Patient Details  Name: Madison Hurst MRN: [id number] Date of Birth: December 23, 1960  Today's Date: 04/29/2020 OT Individual Time: 1100-1210 OT Individual Time Calculation (min): 70 min    Short Term Goals: Week 1:  OT Short Term Goal 1 (Week 1): Pt will complete UB dressing with min assist to donn pullover shirt. OT Short Term Goal 2 (Week 1): Pt will complete toilet transfer with min assist stand pivot. OT Short Term Goal 3 (Week 1): Pt will complete LB dressing with min assist for donning brief and pants only sit to stand.  Skilled Therapeutic Interventions/Progress Updates:    Patient seated in w/c, alert and cooperative, appropriate conversation but note moderate confusion at times - she requires ongoing cues for safety.  Husband present for session.  She denies pain.   Patient's clothing wet with urine.  Completed SPT w/c to shower bench with mod a.  Min/mod A t/o shower for upright posture and seated balance.  Provided long handled sponge - difficulty with holding and manipulating - she did better with wash cloth but has difficulty with reach for buttocks, proximal arms, and bilateral lower legs.  She is dependent for hair washing due to limited reach.  SPT back to w/c for dressing.  Mod/max A for OH shirt with max cues for directionality.  Max A / dependent to donn brief and pants - trial with reach but unable to manage at this time due to limited hand grasp - may benefit from universal cuff with hook/dressing stick end.  Clothing management in stance with max A.  Oral care seated at sink with set up.  Mod a for brushing hair.  Patient with request for toileting at end of session - SPT to/from toilet min A, cues for weight shift and hand placement.  Dependent for pants down/up, she is able to complete hygiene with CS.  She remained seated in w/c at close of session, seat belt alarm set and call bell in reach.    Therapy  Documentation Precautions:  Precautions Precautions: Fall,Other (comment) Precaution Comments: EF 20-25%, L hemiparesis, RA (hands, shoulders, and ankles) Restrictions Weight Bearing Restrictions: No   Therapy/Group: Individual Therapy  Barrie Lyme 04/29/2020, 7:47 AM

## 2020-04-29 NOTE — Progress Notes (Signed)
[facility] Individual Statement of Services  Patient Name:  Madison Hurst  Date:  04/29/2020  Welcome to the [facility].  Our goal is to provide you with an individualized program based on your diagnosis and situation, designed to meet your specific needs.  With this comprehensive rehabilitation program, you will be expected to participate in at least 3 hours of rehabilitation therapies Monday-Friday, with modified therapy programming on the weekends.  Your rehabilitation program will include the following services:  Physical Therapy (PT), Occupational Therapy (OT), Speech Therapy (ST), 24 hour per day rehabilitation nursing, Therapeutic Recreaction (TR), Neuropsychology, Care Coordinator, Rehabilitation Medicine, Nutrition Services, Pharmacy Services and Other  Weekly team conferences will be held on Wednesday to discuss your progress.  Your Inpatient Rehabilitation Care Coordinator will talk with you frequently to get your input and to update you on team discussions.  Team conferences with you and your family in attendance may also be held.  Expected length of stay: 15-19 Days  Overall anticipated outcome: Supervision/Min A  Depending on your progress and recovery, your program may change. Your Inpatient Rehabilitation Care Coordinator will coordinate services and will keep you informed of any changes. Your Inpatient Rehabilitation Care Coordinator's name and contact numbers are listed  below.  The following services may also be recommended but are not provided by the Inpatient [facility]:    Home Health Rehabiltiation Services  Outpatient Rehabilitation Services    Arrangements will be made to provide these services after discharge if needed.  Arrangements include referral to agencies that provide these services.  Your insurance has been verified to be:  [employer] Your primary doctor is:  Buckner Malta,  MD  Pertinent information will be shared with your doctor and your insurance company.  Inpatient Rehabilitation Care Coordinator:  Lavera Guise, [place] [phone] or [phone]  Information discussed with and copy given to patient by: Andria Rhein, 04/29/2020, 10:49 AM

## 2020-04-29 NOTE — Progress Notes (Signed)
Physical Therapy Session Note  Patient Details  Name: Madison Hurst MRN: [id number] Date of Birth: [birth year]  Today's Date: 04/29/2020 PT Individual Time: 0902-1005  PT Individual Time Calculation (min): 63 min   Short Term Goals: Week 1:  PT Short Term Goal 1 (Week 1): Pt will perform supine<>sit with CGA PT Short Term Goal 2 (Week 1): Pt will perform sit<>stand using LRAD with min assist PT Short Term Goal 3 (Week 1): Pt will perform bed<>chair transfers using LRAD with min assist PT Short Term Goal 4 (Week 1): Pt will ambulate at least 24ft using LRAD with min assist  Skilled Therapeutic Interventions/Progress Updates:   Patient supine in bed upon PT arrival. Patient alert and agreeable to PT session. Patient denied pain during session.  Therapeutic Activity: Bed Mobility: Pt in hospital gown and wishes to change into clothing. Pt initates roll to L side with reach of RUE to bedrail. req's Min A for bringing BLE off EOB and initiates push of UB from bed and can complete to upright seated position with Min A. UB dressing requires Mod A and vc for technique. LB requires Max A. Seated balance maintained throughout with one lean posteriorly and pt able to catch herself. Requires Mod A with vc for abdominal activation to complete to upright seated position. Between reaching and dressing tasks, pt requires RUE support on EOB during therapeutic rest.  Transfers: Patient performed STS from EOB to RW as well as to HHA with Mod A for power up and Min A to complete to upright stance. Provided verbal cues for forward lean, glute and quad activation for upright posture. SPVT req's instructions prior to initiation, Mod A to complete for balance and vc for hand progression, lateral weight shifting, and step initiation.   Gait Training:  Patient ambulated 8 feet using RW with Mod A for balance. She required physical shift of weight laterally and vc for foot advancement. Difficulty  advancing BLE with LLE more difficult than RLE. Pt demos difficulty maintaining forward weight shift to balance over toes/ midfoot despite vc/tc to correct.   Neuromuscular Re-ed: NMR facilitated during session with focus on seated balance during dressing activity and in education re: forward lean in order to maintain balance in standing. Pt guided in standing bouts with focus on proprioception of balance over heels/ toes. On initial stance, pt is unable to correct body weight over heels and is unable to demo a forward lean in order to correct and maintain balance. Several instances of posterior LOB with PT allowing pt to feel LOB and Min to Mod A to maintain. She is unable to self correct. Pt guided in seated forward lean ativity in reaching far outside of BOS with BUE for objects anterior and across midline. Pt educated that she did not require physical assist to perform and was able to return to upright midline orientation with only vc to correct. Standing attempted again with improved results. Pt performs STS to RW with min A and is able to attain upright stance with CGA and maintain for >40sec. NMR performed for improvements in motor control and coordination, balance, sequencing, judgement, and self confidence/ efficacy in performing all aspects of mobility at highest level of independence.   Patient seated upright in w/c at end of session with brakes locked, belt alarm set, and all needs within reach.    Therapy Documentation Precautions:  Precautions Precautions: Fall,Other (comment) Precaution Comments: EF 20-25%, L hemiparesis, RA (hands, shoulders, and ankles) Restrictions Weight Bearing  Restrictions: No    Therapy/Group: Individual Therapy  Loel Dubonnet 04/29/2020, 5:56 PM

## 2020-04-30 ENCOUNTER — Telehealth: Payer: Medicare PPO | Admitting: Cardiology

## 2020-04-30 LAB — GLUCOSE, CAPILLARY
Glucose-Capillary: 107 mg/dL — ABNORMAL HIGH (ref 70–99)
Glucose-Capillary: 152 mg/dL — ABNORMAL HIGH (ref 70–99)
Glucose-Capillary: 148 mg/dL — ABNORMAL HIGH (ref 70–99)

## 2020-04-30 NOTE — Progress Notes (Signed)
Occupational Therapy Session Note  Patient Details  Name: Madison Hurst MRN: [id number] Date of Birth: 03/11/1961  Today's Date: 04/30/2020 OT Individual Time: 0905-1002 OT Individual Time Calculation (min): 57 min    Short Term Goals: Week 1:  OT Short Term Goal 1 (Week 1): Pt will complete UB dressing with min assist to donn pullover shirt. OT Short Term Goal 2 (Week 1): Pt will complete toilet transfer with min assist stand pivot. OT Short Term Goal 3 (Week 1): Pt will complete LB dressing with min assist for donning brief and pants only sit to stand.  Skilled Therapeutic Interventions/Progress Updates:    Pt in bed to start session.  She was able to transition to sitting with mod assist and mod instructional cueing for technique.  Then had her work on dressing from the EOB.  She was able to maintain sitting balance with supervision but did exhibit one LOB posteriorly when therapist was having her lift her legs to assist with donning  TEDs.  She needed mod assist for donning a pullover shirt in order to pull it over her head and then pull it down over her trunk.  She was able to thread her pants over the RLE and then used the reacher to assist some with placing the LLE in.  Decreased efficiency noted with use of the reacher, dressing stick may work better, but none available at this time.  She needed min assist for sit to stand with mod assist for pulling them up over her hips.  Attempted use of sockaide after therapist donned socks, but max assist was still needed to pull them up her feet.  She then worked on Catering manager at the sink.  She was able to apply her toothpaste to the brush and brush her teeth without assistance.  She did however exhibit difficulty holding her cup to rinse out her mouth, spilling it in the process.  Therapist helped her clean it up and then provided her with mug that has handle and lid.  She was able to hold this more efficiently and drink from it.   Finished session with pt sitting in the wheelchair with the call button and phone in reach and safety belt in place.    Therapy Documentation Precautions:  Precautions Precautions: Fall,Other (comment) Precaution Comments: EF 20-25%, L hemiparesis, RA (hands, shoulders, and ankles) Restrictions Weight Bearing Restrictions: No   Pain: Pain Assessment Pain Scale: Faces Pain Score: 0-No pain Faces Pain Scale: Hurts little more Pain Type: Acute pain Pain Location: Foot Pain Orientation: Left;Right Pain Descriptors / Indicators: Discomfort Pain Frequency: Constant Pain Onset: With Activity Patients Stated Pain Goal: 2 Pain Intervention(s): Repositioned ADL: See Care Tool Section for some details of mobility and selfcare  Therapy/Group: Individual Therapy  Sinaya Minogue OTR/L 04/30/2020, 12:24 PM

## 2020-04-30 NOTE — Progress Notes (Signed)
[facility] PHYSICAL MEDICINE & REHABILITATION PROGRESS NOTE   Subjective/Complaints: No new issues, hands and feet less painful    Review of systems denies chest pain shortness of breath nausea vomiting diarrhea or constipation, + swelling in bilateral ankles, feet and hands  Objective:   No results found. Recent Labs    04/29/20 0554  WBC 10.4  HGB 14.6  HCT 47.5*  PLT 222   Recent Labs    04/29/20 0554  NA 138  K 3.9  CL 96*  CO2 30  GLUCOSE 132*  BUN 14  CREATININE 0.61  CALCIUM 9.6    Intake/Output Summary (Last 24 hours) at 04/30/2020 1226 Last data filed at 04/30/2020 0842 Gross per 24 hour  Intake 420 ml  Output -  Net 420 ml        Physical Exam: Vital Signs Blood pressure 131/87, pulse 97, temperature 98 F (36.7 C), resp. rate 16, height 5' (1.524 m), weight 56.6 kg, SpO2 100 %.   General: No acute distress Mood and affect are appropriate Heart: Regular rate and rhythm no rubs murmurs or extra sounds Lungs: Clear to auscultation, breathing unlabored, no rales or wheezes Abdomen: Positive bowel sounds, soft nontender to palpation, nondistended Extremities: No clubbing, cyanosis, or edema  Neurologic: Cranial nerves II through XII intact, motor strength is 4/5 in bilateral deltoid, bicep, tricep, grip, hip flexor, knee extensors, ankle dorsiflexor and plantar flexor, patient is limited with shoulder range of motion  Musculoskeletal: Bilateral shoulder abduction and forward flexion is 75% of normal ,reduced pain and swelling in digits 2 3 bilaterally at the MCPs, tenderness over the metatarsal phalangeal joints as well   Assessment/Plan: 1. Functional deficits which require 3+ hours per day of interdisciplinary therapy in a comprehensive inpatient rehab setting.  Physiatrist is providing close team supervision and 24 hour management of active medical problems listed below.  Physiatrist and rehab team continue to assess barriers to  discharge/monitor patient progress toward functional and medical goals  Care Tool:  Bathing    Body parts bathed by patient: Right arm,Left arm,Chest,Abdomen,Front perineal area,Right upper leg,Left upper leg,Face   Body parts bathed by helper: Buttocks,Left lower leg,Right lower leg,Right arm,Left arm     Bathing assist Assist Level: Moderate Assistance - Patient 50 - 74%     Upper Body Dressing/Undressing Upper body dressing   What is the patient wearing?: Pull over shirt    Upper body assist Assist Level: Moderate Assistance - Patient 50 - 74%    Lower Body Dressing/Undressing Lower body dressing      What is the patient wearing?: Pants     Lower body assist Assist for lower body dressing: Moderate Assistance - Patient 50 - 74%     Toileting Toileting    Toileting assist Assist for toileting: Maximal Assistance - Patient 25 - 49%     Transfers Chair/bed transfer  Transfers assist     Chair/bed transfer assist level: Minimal Assistance - Patient > 75% Chair/bed transfer assistive device: Armrests   Locomotion Ambulation   Ambulation assist      Assist level: Moderate Assistance - Patient 50 - 74% Assistive device: Walker-rolling Max distance: 8 ft   Walk 10 feet activity   Assist     Assist level: Moderate Assistance - Patient - 50 - 74% Assistive device: Hand held assist   Walk 50 feet activity   Assist Walk 50 feet with 2 turns activity did not occur: Safety/medical concerns  Walk 150 feet activity   Assist Walk 150 feet activity did not occur: N/A (pt does not ambulate this distance at basline)         Walk 10 feet on uneven surface  activity   Assist Walk 10 feet on uneven surfaces activity did not occur: Safety/medical concerns         Wheelchair     Assist Will patient use wheelchair at discharge?: No             Wheelchair 50 feet with 2 turns activity    Assist            Wheelchair  150 feet activity     Assist          Blood pressure 131/87, pulse 97, temperature 98 F (36.7 C), resp. rate 16, height 5' (1.524 m), weight 56.6 kg, SpO2 100 %.   Medical Problem List and Plan: 1.  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  Antithrombotics: -DVT/anticoagulation: Eliquis 5 mg twice daily.  Lower extremity Dopplers negative             -antiplatelet therapy: Aspirin 81 mg daily 3. Pain Management: Continue Hydrocodone as needed             Monitor with increased exertion 4. Mood: Cymbalta 60 mg daily, Wellbutrin 150 mg daily, Ativan 0.5 mg every 8 hours as needed             -antipsychotic agents: N/A 5. Neuropsych: This patient is capable of making decisions on her own behalf. 6. Skin/Wound Care: Routine skin checks 7. Fluids/Electrolytes/Nutrition: Routine in and outs.  CMP ordered 8.  History of PAF/CAD/PFO.  30-day cardiac event monitor per cardiology services Dr.Kardie Tobb.  Continue Eliquis for now.  If EF improves and no atrial fibrillation in 3 months plan to stop Eliquis and restart Plavix.             Monitor with increased activity 9.  Transaminitis.  Follow-up gastroenterology service Dr. Marina Goodell.               Autoimmune markers pending             1/31: AST and ALT reviewed and elevated to 83 and 334 respectively. Both are trending down well, repeat weekly.   10.  Hyperlipidemia.  Crestor on hold due to elevated LFTs. 11.  Rheumatoid arthritis: Low-dose prednisone as well as weekly Orencia            Having rheumatoid flare cont 20mg  prednisone, hope to reduce to 10mg  later in week. 12.  Diabetes mellitus.  Hemoglobin A1c 6.1.  SSI.  Patient on Metformin 500 mg daily prior to admission.  Resume as needed          CBG (last 3)  Recent Labs     04/29/20 2058 04/30/20 0608 04/30/20 1149  GLUCAP 139* 107* 152*  fair control despite increased prednisone  13.  Hypothyroidism: Synthroid 14.  Essential hypertension.. Blood pressure had remained a bit soft Toprol-XL resumed at 12.5 mg daily 04/26/2020  1/31: BP well controlled, continue current regimen.  Vitals:   04/29/20 1924 04/30/20 0310  BP: 118/82 131/87  Pulse: 89 97  Resp: 18 16  Temp: 97.9 F (36.6 C) 98 F (36.7 C)  SpO2: 100% 100%  controlled 2/1  15.  Constipation but multiple stools post sorbitol 1/29 16. Insomnia: melatonin  started HS  LOS: 4 days A FACE TO FACE EVALUATION WAS PERFORMED  Erick Colace 04/30/2020, 12:26 PM

## 2020-04-30 NOTE — Progress Notes (Signed)
Physical Therapy Session Note  Patient Details  Name: Madison Hurst MRN: [id number] Date of Birth: 1960-05-23  Today's Date: 04/30/2020 PT Individual Time: 5573-2202 PT Individual Time Calculation (min): 29 min   and  Today's Date: 04/30/2020 PT Missed Time: 31 Minutes Missed Time Reason: Patient fatigue  Short Term Goals: Week 1:  PT Short Term Goal 1 (Week 1): Pt will perform supine<>sit with CGA PT Short Term Goal 2 (Week 1): Pt will perform sit<>stand using LRAD with min assist PT Short Term Goal 3 (Week 1): Pt will perform bed<>chair transfers using LRAD with min assist PT Short Term Goal 4 (Week 1): Pt will ambulate at least 70ft using LRAD with min assist  Skilled Therapeutic Interventions/Progress Updates:    Pt received supine in bed awake and agreeable to therapy session reporting need to use bathroom. Pt states she has been sleeping all day and is unable to recall having OT nor SLP sessions earlier today. Supine>sitting L EOB, HOB slightly elevated and using bedrail, min assist for trunk upright. Pt noted to be drenched in sweat - she states this occurs when she sleeps at baseline. Donned shoes max assist. Sit>stand EOB>L HHA mod assist for lifting/balance. Gait ~34ft into bathroom using L HHA transitioned to R HHA as pt demos significant R lateral trunk lean - mod assist for balance as pt demos increased postural sway with shuffled steps with 1x max assist for balance due to increased posterior lean with poor balance recovery strategies. Standing UE support on grab bar and min/mod assist for balance due to posterior lean during total assist LB clothing management. Continent of bladder - seated peri-care set-up assist. Sit>stand toilet>RW and grab bar support mod assist for lifting and balance - pulled up pants total assist. Initiated gait back to room using RW with min assist then after ~14ft pt states she is going to need to sit soon - walked back to toilet using RW mod  assist for balance and AD management, cuing for sequencing - continues with very short shuffled steps and poor R/L weight shift onto stance limb allowing her to step. Once sitting on toilet pt keeping eyes closed stating she doesn't feel great but can't really describe how she feels. Of note pt slurring her words and appearing drowsy throughout session with therapist having a difficult time understanding her. L stand pivot toilet>w/c using grab bar support light mod assist for balance. Transported back in room in w/c.  Assessed vitals:  L arm: BP 134/100 (MAP 110), HR 93bpm R arm: BP 122/88 (MAP 96), HR 91bpm, SpO2 100% (immediately after assessing in L) Pt disoriented to location stating she is at "Jimmy's house" when oriented her to location she states "oh, I'm still here" oriented to situation stating she has had "mini-strokes" and states month as "January" with year "2022"   Pt requesting to return to bed and rest politely declining further participation in therapy session. R stand pivot w/c>EOB HHA mod assist for balance due to posterior lean and continuing to lack adequate weight shift onto stance limb to allow sufficient step on opposite side. Doffed shoes total assist. Sit>supine min assist B LE management into bed. Supine scoot towards HOB using bed sheets. Pt left supine in bed with needs in reach, HOB elevated, and bed alarm on. Missed 31 minutes of skilled physical therapy.  Therapist spoke with Clydie Braun, RN regarding pt's increased lethargy, slurred speech, and increased imbalance - RN reports may be related to medications and will continue  to monitor pt.   Therapy Documentation Precautions:  Precautions Precautions: Fall,Other (comment) Precaution Comments: EF 20-25%, L hemiparesis, RA (hands, shoulders, and ankles) Restrictions Weight Bearing Restrictions: No  Pain: No reports of pain throughout session.   Therapy/Group: Individual Therapy  Ginny Forth , PT, DPT,  CSRS  04/30/2020, 3:32 PM

## 2020-04-30 NOTE — Progress Notes (Signed)
Speech Language Pathology Daily Session Note  Patient Details  Name: Madison Hurst MRN: [id number] Date of Birth: 06-Feb-1961  Today's Date: 04/30/2020 SLP Individual Time: 1100-1158 SLP Individual Time Calculation (min): 58 min  Short Term Goals: Week 1: SLP Short Term Goal 1 (Week 1): Pt will utilize external memory aids to facilitate recall of daily information with min assist verbal cues SLP Short Term Goal 2 (Week 1): Pt will complete mildly complex functional tasks wtih min assist verbal cues. SLP Short Term Goal 3 (Week 1): Pt will recognize and correct errors in the moment when completing mildly complex tasks with min assist verbal cues. SLP Short Term Goal 4 (Week 1): Pt will selectively attend to functional tasks in a mildly distracting environment for 10 minutes with supervision verbal cues for redirection.  Skilled Therapeutic Interventions: Pt was seen for skilled ST targeting cognitive goals. Her husband was present for 3/4 of session. SLP facilitated session with basic money and medication management tasks from the AL[facility]. Pt reported she managed checkbook/finances and medications independently prior to admission. She required Mod to Max A verbal and visual cues for error awareness, problem solving, and working memory when using cash and coins to calculate totals and change. Most assistance was needed for subtraction (change) calculations, although moderate cueing still required for totaling (addition) and displaying set amounts. She was unaware of all functional errors without cues from SLP. Pt interpreted basic medication labels to answer questions and organize a pill chart with overall Min A verbal and visual cues for problem solving, error awareness, and recall within task. Pt with good awareness of the level of difficulty of these tasks, and how it differed drastically from what she expected/how she feels she would have performed at baseline. She continues to benefit  from skilled ST to address cognitive function. Pt left sitting in wheelchair with alarm set and needs within reach. Continue per current plan of care.          Pain Pain Assessment Pain Scale: 0-10 Pain Score: 0-No pain   Therapy/Group: Individual Therapy  [name] 04/30/2020, 7:22 AM

## 2020-05-01 DIAGNOSIS — F418 Other specified anxiety disorders: Secondary | ICD-10-CM

## 2020-05-01 LAB — GLUCOSE, CAPILLARY
Glucose-Capillary: 174 mg/dL — ABNORMAL HIGH (ref 70–99)
Glucose-Capillary: 113 mg/dL — ABNORMAL HIGH (ref 70–99)
Glucose-Capillary: 124 mg/dL — ABNORMAL HIGH (ref 70–99)
Glucose-Capillary: 139 mg/dL — ABNORMAL HIGH (ref 70–99)
Glucose-Capillary: 216 mg/dL — ABNORMAL HIGH (ref 70–99)
Glucose-Capillary: 172 mg/dL — ABNORMAL HIGH (ref 70–99)

## 2020-05-01 MED ORDER — PREDNISONE 5 MG PO TABS
10.0000 mg | ORAL_TABLET | Freq: Every day | ORAL | Status: DC
  Administered 2020-05-02: 10 mg via ORAL
  Filled 2020-05-01: qty 2

## 2020-05-01 NOTE — Progress Notes (Signed)
Physical Therapy Session Note  Patient Details  Name: Madison Hurst MRN: [id number] Date of Birth: 1960/12/14  Today's Date: 05/01/2020 PT Individual Time: 1415-1500 PT Individual Time Calculation (min): 45 min   and  Today's Date: 05/01/2020 PT Missed Time: 15 Minutes Missed Time Reason: Patient fatigue  Short Term Goals: Week 1:  PT Short Term Goal 1 (Week 1): Pt will perform supine<>sit with CGA PT Short Term Goal 2 (Week 1): Pt will perform sit<>stand using LRAD with min assist PT Short Term Goal 3 (Week 1): Pt will perform bed<>chair transfers using LRAD with min assist PT Short Term Goal 4 (Week 1): Pt will ambulate at least 74ft using LRAD with min assist  Skilled Therapeutic Interventions/Progress Updates:    Pt received supine in bed with husband exiting upon therapist arrival - pt agreeable to therapy session. Discussed D/C plan and confirmed current ELOS with D/C on 2/15 with plan for family education/training closer to D/C. Discussed recommendation of 24hr support at D/C with LTGs of [facility] and pt's husband reporting they do not have a lot of support but he will see if he can arrange for that. Pt's husband reports he had a CVA last year with residual R hemiparesis and is limited in the amount of physical assistance he can provide pt. Confirmed with pt's husband her history of significant episodes of sweating with pt stating it is as though someone sprayed her down with a water hose - they report it does not happen at consistent times (day vs night).  Noticed pt's bilateral toes are light blue (R>L) and cold to the touch with hands somewhat blue as well and cold although pt reports feeling hot. Supine>sitting L EOB, HOB flat but using bedrail, with min assist for trunk upright. Sitting EOB donned shoes max assist. L stand pivot EOB>w/c using RW with min assist for lifting and balance while turning - max sequential cuing for AD and LE stepping with pt demoing decreased motor  planning and very short shuffled steps with lack of sufficient weight shift onto stance limb to allow adequate step on opposite LE. Assessed vitals: BP 114/85 (MAP 94), HR 85bpm, SpO2 98%, Temperature 97.7degrees.  Transported to/from gym in w/c for time management and energy conservation. Sit>stand w/c>RW min assist for lifting and balance due to posterior lean. Gait training ~87ft using RW mod assist for balance - limited distance as pt reporting significant fatigue - severely decreased speed of movement with pt continuing to have significantly decreased step lengths with lack of weight shift onto stance limb and decreased foot clearance - rigid trunk posturing. When turning to sit back in w/c pt with poor motor planning and difficulty following simple 1-step commands to sequence AD management and LE stepping to turn and sit. Encouraged pt to participate in another bout of gait and when preparing to come to stand pt states she feels "too weak" to stand up. Pt demos increasing fatigue with heavy eye lids. With encouragement, pt agreeable to participate in seated level LE strengthening exercises:  - 2x20 reps long arc quads against 4lb ankle weights  - 2x20reps seated marches wearing 4lb ankle weights  - seated ball toss - flipped back armrests and had pt scoot forward to limit back support and increase core/trunk activation Throughout exercises, with fatigue pt would gradually lean trunk back requiring cuing to return to midline. With encouragement, pt agreeable to another attempt at standing. Sit>stand w/c>RW min assist for lifting to stand and balance due to  posterior lean. Standing marches using B UE support on RW working on R/L weight shift onto stance limb with pt demoing minimal lifting of each LE requiring heavy min assist for balance. Pt left seated in w/c with OT present to assume care of patient.   Therapist spoke with Jesusita Oka, [place] regarding concerns of pt's toes and hands being blue with cold to touch,  pt's increased lethargy in afternoons limiting participation in therapy sessions, pt's hx of significant sweating at random times of day and night, and timing of pain medications effecting lethargy.   Therapy Documentation Precautions:  Precautions Precautions: Fall,Other (comment) Precaution Comments: EF 20-25%, L hemiparesis, RA (hands, shoulders, and ankles) Restrictions Weight Bearing Restrictions: No  Pain: No reports of pain throughout session.   Therapy/Group: Individual Therapy  Ginny Forth , PT, DPT, CSRS  05/01/2020, 10:50 AM

## 2020-05-01 NOTE — Consult Note (Signed)
Neuropsychological Consultation   Patient:   Madison Hurst   DOB:   February 13, 1961  MR Number:  [id number]  Location:  [facility] [address] CENTER A [address] [id number] [place] [address] Dept: [phone] Loc: [phone]           Date of Service:   05/01/2020  Start Time:   1 PM End Time:   2 PM  Provider/Observer:  Arley Phenix, Psy.D.       Clinical Neuropsychologist       Billing Code/Service: 18841  Chief Complaint:    Madison Hurst is a 60 year old female with history of proximal atrial tachycardia, CAD, hypertension, diabetes, chronic anemia, hyperlipidemia, rheumatoid arthritis on chronic prednisone, anxiety and depression.  Patient continues to take prior psychotropics including Wellbutrin and Cymbalta.  Patient presented on 04/22/2020 with progressive left-sided hemiparesis over the past prior week resulting in 3 falls.  Cranial CT was unremarkable for acute intracranial process but MRI showed acute infarction affecting the right mid posterior caudate and right superior thalamus.  Single separate punctuate acute infarct in the cortex of the medial right PTO junction.  Few small punctuate acute infarcts in the right occipital cortex also noted.  There is no evidence of hemorrhage or mass-effect.  Patient also showed mild stenosis in left PCA 3 segment and MRI cervical spine showed apparent moderate to severe cervical stenosis at C4/5 and C5/6 and moderate at C3/4.  Multilevel neuroforaminal narrowing greatest at C3/4 and on the right C4/5.  Due to patient's progressive left hemiparesis she was admitted to the CIR program due to functional deficits.  Reason for Service:  Patient referred for neuropsychological consultation due to coping and adjustment issues with history of depression anxiety, chronic pain, RA and other medical issues.  Below see HPI for the current admission.  HPI:  Madison Hurst is a 60 year old right-handed female with history of paroxysmal atrial tachycardia followed by Dr.Kardie Tobb, CAD, hypertension, diabetes mellitus, chronic anemia, hyperlipidemia, rheumatoid arthritis maintained on chronic prednisone, anxiety and depression.  History taken from chart review and patient.  Patient lives with spouse.  1 level home 2 steps to entry.  Patient reports ambulating independently but with poor balance.  She presented on 04/22/2020 with progressive left hemiparesis over 1 week resulting in 3 falls.  Admission chemistries glucose 173, AST 65, ALT 62, alkaline phosphatase, alcohol 162, negative hemoglobin A1c 6.1.  Cranial CT unremarkable for acute intracranial process.  Patient did not receive TPA.  MRI showed acute infarction affecting the right mid posterior caudate and right superior thalamus.  Single separate punctate acute infarct in the cortex of the medial right parieto-occipital junction.  Few small punctate acute infarcts in the right occipital cortex.  No evidence of hemorrhage or mass-effect.  CT angiogram of head and neck mild stenosis of the left PCA P3 segment.  No emergent large vessel occlusion.  MRI cervical spine showed apparent moderate to severe cervical spinal stenosis at C4-C5 and C5-C6, and moderate at C3-4.  Multilevel neuroforaminal narrowing greatest at C3-4 and on the right C4-5.  Echocardiogram with ejection fraction of 20-25%, with left ventricle global hypokinesis. TEE showed mild to moderate MR.  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  No LAA thrombus.  Lower extremity Dopplers negative.  Orders were given for a video monitor to further evaluate etiology of her CVA.  Currently maintained on aspirin 81 mg daily as well as Eliquis.  LFTs have continued to be elevated and monitored.  Acute hepatitis panel negative.  Her Crestor was discontinued due to elevated LFTs.  Ultrasound of the abdomen showed no focal lesion  identified.  Gallbladder was absent.  Study otherwise unremarkable.  Autoimmune markers are pending.  There is some question of possible need for liver biopsy however felt to be somewhat risky while maintained on Eliquis gastroenterology services Dr. Marina Goodell has been consulted to follow rising LFTs.  Due to patient's progressive left hemiparesis, she was admitted for a comprehensive rehab program.  Please see preadmission assessment from earlier today as well.  Current Status:  Upon entering the room the patient had been sitting in her wheelchair but complained of pain and was moved to the bed in a slightly elevated position.  Patient was able to do pivot transfer but showed significant motor weaknesses and difficulty judging distance from herself to the bed and making turn to sit in the bed.  Patient was carefully monitored and all safety protocols were in place for transfer.  Patient was oriented and aware of what it happened and had several questions about factors that triggered the strokes that she had had.  Patient and husband noted that the husband and had a significant stress prior with significant residual right-sided motor deficits and had spent time in skilled nursing both in [place] area as well as in [place] following his stroke.  Patient had been helping out with him.  Patient acknowledged some visual spatial and visual analysis deficits consistent with parieto-occipital junction infarction.  Patient described "strange" sensory experiences primarily on the right side consistent with thalamic involvement.  Patient described urgency to move fairly constantly.  She was oriented and generally good cognition outside of significant drowsiness and lethargy.  Patient described history of depression anxiety primarily elevated with her medical issues and chronic pain symptoms as well as recent stroke for her husband.  Behavioral Observation: Madison Hurst  presents as a 60  y.o.-year-old Right Caucasian Female who appeared her stated age. her dress was Appropriate and she was Well Groomed and her manners were Appropriate to the situation.  her participation was indicative of Appropriate and Redirectable behaviors.  There were physical disabilities noted.  she displayed an appropriate level of cooperation and motivation.     Interactions:    Active Appropriate, Inattentive and Redirectable  Attention:   abnormal and attention span appeared shorter than expected for age  Memory:   within normal limits; recent and remote memory intact  Visuo-spatial:  abnormal  Speech (Volume):  low  Speech:   normal; slowed responding  Thought Process:  Coherent and Relevant  Though Content:  WNL; not suicidal and not homicidal  Orientation:   person, place and time/date  Judgment:   Fair  Planning:   Poor  Affect:    Flat and Lethargic  Mood:    Dysphoric  Insight:   Fair  Intelligence:   normal  Medical History:   Past Medical History:  Diagnosis Date  . Anemia 02/09/2020  . Anxiety 10/28/2019  . Depressed left ventricular ejection fraction 03/13/2020  . Depressive disorder 10/28/2019  . DOE (dyspnea on exertion) 03/13/2020  . Hypothyroidism 02/09/2020  . Iron deficiency anemia 01/24/2020  . Metabolic syndrome 03/13/2020  . Multinodular goiter 03/07/2015  . PAC (premature atrial contraction) 03/13/2020  . PAT (paroxysmal atrial tachycardia) (HCC) 03/13/2020  . Pericardial effusion 02/09/2020  . 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 Type 2 diabetes mellitus (HCC) 05/08/2019  . Vitamin D deficiency 02/09/2020         Patient Active Problem List   Diagnosis Date Noted  . Patent foramen ovale 04/26/2020  . Right middle cerebral artery stroke (HCC) 04/26/2020  . Acute on chronic combined systolic and diastolic CHF (congestive heart failure) (HCC)   .  Transaminitis   . Rheumatoid arthritis involving multiple sites (HCC)   . Elevated liver function tests   . CVA (cerebral vascular accident) (HCC) 04/22/2020  . Essential hypertension 04/22/2020  . DOE (dyspnea on exertion) 03/13/2020  . Metabolic syndrome 03/13/2020  . Depressed left ventricular ejection fraction 03/13/2020  . PAC (premature atrial contraction) 03/13/2020  . PAT (paroxysmal atrial tachycardia) (HCC) 03/13/2020  . Anemia 02/09/2020  . Hypothyroidism 02/09/2020  . Pericardial effusion 02/09/2020  . 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 Vitamin D deficiency 02/09/2020  . Iron deficiency anemia, unspecified   . Iron deficiency anemia 01/24/2020  . Anxiety 10/28/2019  . Depression with anxiety 10/28/2019  . Type 2 diabetes mellitus (HCC) 05/08/2019  . 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 Multinodular goiter 03/07/2015         Psychiatric History:  Patient with prior history of depression with anxiety exacerbated by significant pain symptoms and loss of functioning  Family Med/Psych History:  Family History  Problem Relation Age of Onset  . Muscular dystrophy Mother   . Diabetes Father   . Diabetes Brother   . Heart disease Brother    Impression/DX:  Madison Hurst is a 60 year old female with history of proximal atrial tachycardia, CAD, hypertension, diabetes, chronic anemia, hyperlipidemia, rheumatoid arthritis on chronic prednisone, anxiety and depression.  Patient continues to take prior psychotropics including Wellbutrin and Cymbalta.  Patient presented on 04/22/2020 with progressive left-sided hemiparesis over the past prior week resulting in 3 falls.  Cranial CT was unremarkable for acute intracranial process but MRI showed acute infarction affecting the right mid posterior caudate and right superior thalamus.  Single separate punctuate acute infarct in the cortex of the medial right PTO  junction.  Few small punctuate acute infarcts in the right occipital cortex also noted.  There is no evidence of hemorrhage or mass-effect.  Patient also showed mild stenosis in left PCA 3 segment and MRI cervical spine showed apparent moderate to severe cervical stenosis at C4/5 and C5/6 and moderate at C3/4.  Multilevel neuroforaminal narrowing greatest at C3/4 and on the right C4/5.  Due to patient's progressive left hemiparesis she was admitted to the CIR program due to functional deficits.  Upon entering the room the patient had been sitting in her wheelchair but complained of pain and was moved to the bed in a slightly elevated position.  Patient was able to do pivot transfer but showed significant motor weaknesses and difficulty judging distance from herself to the bed and making turn to sit in the bed.  Patient was carefully monitored and all safety protocols were in place for transfer.  Patient was oriented and aware of what it happened and had several questions about factors that triggered the strokes that she had had.  Patient and husband noted that the husband and had a significant stress prior with significant residual right-sided motor deficits and had spent time in skilled nursing both in [place] area as well as in [place] following his stroke.  Patient had been helping out with him.  Patient acknowledged some  visual spatial and visual analysis deficits consistent with parieto-occipital junction infarction.  Patient described "strange" sensory experiences primarily on the right side consistent with thalamic involvement.  Patient described urgency to move fairly constantly.  She was oriented and generally good cognition outside of significant drowsiness and lethargy.  Patient described history of depression anxiety primarily elevated with her medical issues and chronic pain symptoms as well as recent stroke for her husband.  Disposition/Plan:  Today we worked on coping and adjustment  issues.  Patient denied severe anxiety or depression to the point limiting her involvement.  Pain and lethargy/sedation are major roadblocks and active therapeutic situation.  Diagnosis:    Right middle cerebral artery stroke [facility]) - Plan: Ambulatory referral to Neurology         Electronically Signed   _______________________ Arley Phenix, Psy.D. Clinical Neuropsychologist

## 2020-05-01 NOTE — Progress Notes (Addendum)
Occupational Therapy Session Note  Patient Details  Name: Madison Hurst MRN: [id number] Date of Birth: 04-28-1960  Today's Date: 05/01/2020 OT Individual Time: 6767-2094 OT Individual Time Calculation (min): 45 min    Short Term Goals: Week 1:  OT Short Term Goal 1 (Week 1): Pt will complete UB dressing with min assist to donn pullover shirt. OT Short Term Goal 2 (Week 1): Pt will complete toilet transfer with min assist stand pivot. OT Short Term Goal 3 (Week 1): Pt will complete LB dressing with min assist for donning brief and pants only sit to stand.  Skilled Therapeutic Interventions/Progress Updates:   Session 1: [phone]) Pt in bed to start session, sleeping.  She woke up briefly, but once she was not engaged in conversation, she would fall back to sleep.  Once sitting up, she was able to maintain her attention however.  She completed supine to sit EOB with min assist.  She was able to work on doffing and donning gripper socks with use of a dressing stick to remove them and a sockaide to donn them.  She could remove them with supervision, but needed max assist for setup of sock on the sockaide as well as mod assist to actually donn them secondary to bilateral RA in her hands.  Once donned, she was able to ambulate to the elevated toilet with mod assist and increased time using the RW.  She needed max assist for clothing management and toilet hygiene sit to stand.  She transferred out to the wheelchair for grooming tasks at the sink with setup for washing her hands and for oral care.  She was left sitting up in the wheelchair with the call button and phone in place and safety belt in place.    Session 2: ([phone])  Pt in therapy gym finishing PT session.  She reports increased fatigue, but agreed to work with therapist.  Practiced tub shower transfers with use of the RW and tub bench during session.  She needed mod assist to complete secondary to posterior and right lean as  well as decreased ability to lift her LEs over the edge of the tub.  Mod assist for transfer back to the wheelchair with LOB posteriorly and to the left secondary to not moving her LLE efficiently enough during transfer when trying to turn to the wheelchair.  She next completed transfer back to the room and then to the bed at mod assist to conclude session.  Pt with call button and phone in reach and safety alarm in place.    Therapy Documentation Precautions:  Precautions Precautions: Fall,Other (comment) Precaution Comments: EF 20-25%, L hemiparesis, RA (hands, shoulders, and ankles) Restrictions Weight Bearing Restrictions: No   Pain: Pain Assessment Pain Scale: Faces Faces Pain Scale: Hurts a little bit Pain Type: Chronic pain Pain Location: Back Pain Orientation: Right;Left Pain Descriptors / Indicators: Grimacing;Discomfort Pain Onset: With Activity Pain Intervention(s): Repositioned Multiple Pain Sites: No ADL: See Care Tool Section for some details of mobility and selfcare  Therapy/Group: Individual Therapy  Jeffrie Lofstrom OTR/L 05/01/2020, 10:53 AM

## 2020-05-01 NOTE — Progress Notes (Signed)
Patient ID: Madison Hurst, female   DOB: 15-Mar-1961, 60 y.o.   MRN: [id number] Team Conference Report to Patient/Family  Team Conference discussion was reviewed with the patient and caregiver, including goals, any changes in plan of care and target discharge date.  Patient and caregiver express understanding and are in agreement.  The patient has a target discharge date of 05/14/20.  Andria Rhein 05/01/2020, 2:02 PM

## 2020-05-01 NOTE — Progress Notes (Signed)
[facility] PHYSICAL MEDICINE & REHABILITATION PROGRESS NOTE   Subjective/Complaints:  Joints are feeling better discussed bruising related to prednisone     Review of systems denies chest pain shortness of breath nausea vomiting diarrhea or constipation, + swelling in bilateral ankles, feet and hands  Objective:   No results found. Recent Labs    04/29/20 0554  WBC 10.4  HGB 14.6  HCT 47.5*  PLT 222   Recent Labs    04/29/20 0554  NA 138  K 3.9  CL 96*  CO2 30  GLUCOSE 132*  BUN 14  CREATININE 0.61  CALCIUM 9.6    Intake/Output Summary (Last 24 hours) at 05/01/2020 0854 Last data filed at 05/01/2020 0700 Gross per 24 hour  Intake 377 ml  Output -  Net 377 ml        Physical Exam: Vital Signs Blood pressure 127/90, pulse 95, temperature 98.2 F (36.8 C), resp. rate 16, height 5' (1.524 m), weight 56.6 kg, SpO2 100 %.  General: No acute distress Mood and affect are appropriate Heart: Regular rate and rhythm no rubs murmurs or extra sounds Lungs: Clear to auscultation, breathing unlabored, no rales or wheezes Abdomen: Positive bowel sounds, soft nontender to palpation, nondistended Extremities: No clubbing, cyanosis, or edema Skin: No evidence of breakdown, no evidence of rash  Neurologic: Cranial nerves II through XII intact, motor strength is 4/5 in bilateral deltoid, bicep, tricep, grip, hip flexor, knee extensors, ankle dorsiflexor and plantar flexor, patient is limited with shoulder range of motion  Musculoskeletal: Bilateral shoulder abduction and forward flexion is 75% of normal ,reduced pain and swelling in digits 2 3 bilaterally at the MCPs, tenderness over the metatarsal phalangeal joints as well   Assessment/Plan: 1. Functional deficits which require 3+ hours per day of interdisciplinary therapy in a comprehensive inpatient rehab setting.  Physiatrist is providing close team supervision and 24 hour management of active medical problems listed  below.  Physiatrist and rehab team continue to assess barriers to discharge/monitor patient progress toward functional and medical goals  Care Tool:  Bathing    Body parts bathed by patient: Right arm,Left arm,Chest,Abdomen,Front perineal area,Right upper leg,Left upper leg,Face   Body parts bathed by helper: Buttocks,Left lower leg,Right lower leg,Right arm,Left arm     Bathing assist Assist Level: Moderate Assistance - Patient 50 - 74%     Upper Body Dressing/Undressing Upper body dressing   What is the patient wearing?: Pull over shirt    Upper body assist Assist Level: Moderate Assistance - Patient 50 - 74%    Lower Body Dressing/Undressing Lower body dressing      What is the patient wearing?: Pants     Lower body assist Assist for lower body dressing: Moderate Assistance - Patient 50 - 74%     Toileting Toileting    Toileting assist Assist for toileting: Maximal Assistance - Patient 25 - 49%     Transfers Chair/bed transfer  Transfers assist     Chair/bed transfer assist level: Moderate Assistance - Patient 50 - 74% Chair/bed transfer assistive device: Armrests   Locomotion Ambulation   Ambulation assist      Assist level: Moderate Assistance - Patient 50 - 74% Assistive device: Hand held assist Max distance: 33f   Walk 10 feet activity   Assist     Assist level: Moderate Assistance - Patient - 50 - 74% Assistive device: Hand held assist   Walk 50 feet activity   Assist Walk 50 feet with 2 turns  activity did not occur: Safety/medical concerns         Walk 150 feet activity   Assist Walk 150 feet activity did not occur: N/A (pt does not ambulate this distance at basline)         Walk 10 feet on uneven surface  activity   Assist Walk 10 feet on uneven surfaces activity did not occur: Safety/medical concerns         Wheelchair     Assist Will patient use wheelchair at discharge?: No             Wheelchair  50 feet with 2 turns activity    Assist            Wheelchair 150 feet activity     Assist          Blood pressure 127/90, pulse 95, temperature 98.2 F (36.8 C), resp. rate 16, height 5' (1.524 m), weight 56.6 kg, SpO2 100 %.   Medical Problem List and Plan: 1.  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 Formulized individual treatment plan based on medical history, underlying problem and comorbidities.  2.  Antithrombotics: -DVT/anticoagulation: Eliquis 5 mg twice daily.  Lower extremity Dopplers negative             -antiplatelet therapy: Aspirin 81 mg daily 3. Pain Management: Continue Hydrocodone as needed             Monitor with increased exertion 4. Mood: Cymbalta 60 mg daily, Wellbutrin 150 mg daily, Ativan 0.5 mg every 8 hours as needed             -antipsychotic agents: N/A 5. Neuropsych: This patient is capable of making decisions on her own behalf. 6. Skin/Wound Care: Routine skin checks 7. Fluids/Electrolytes/Nutrition: Routine in and outs.  CMP ordered 8.  History of PAF/CAD/PFO.  30-day cardiac event monitor per cardiology services Dr.Kardie Tobb.  Continue Eliquis for now.  If EF improves and no atrial fibrillation in 3 months plan to stop Eliquis and restart Plavix.             Monitor with increased activity 9.  Transaminitis.  Follow-up gastroenterology service Dr. Henrene Pastor.               Autoimmune markers pending             1/31: AST and ALT reviewed and elevated to 83 and 334 respectively. Both are trending down well, repeat weekly.   10.  Hyperlipidemia.  Crestor on hold due to elevated LFTs. 11.  Rheumatoid arthritis:  Low-dose prednisone as well as weekly Orencia            Having rheumatoid flare cont 32m prednisone, hope to reduce to 110mlater in week. 12.  Diabetes mellitus.  Hemoglobin A1c 6.1.  SSI.  Patient on Metformin 500 mg daily prior to admission.  Resume as needed          CBG (last 3)  Recent Labs    04/30/20 2158 05/01/20 0555 05/01/20 0557  GLUCAP 148* 113* 124*  fair control despite increased prednisone  13.  Hypothyroidism: Synthroid 14.  Essential hypertension.. Blood pressure had remained a bit soft Toprol-XL resumed at 12.5 mg daily 04/26/2020  1/31: BP well controlled, continue current regimen.            Vitals:   04/30/20 1542 05/01/20 0317  BP: 114/80 127/90  Pulse: 87 95  Resp: 17 16  Temp: 97.8 F (36.6 C) 98.2 F (36.8 C)  SpO2: 92% 100%  controlled 2/2  15.  Constipation but multiple stools post sorbitol 1/29 16. Insomnia: melatonin 57m started HS  LOS: 5 days [place] 05/01/2020, 8:54 AM

## 2020-05-01 NOTE — Progress Notes (Signed)
Speech Language Pathology Daily Session Note  Patient Details  Name: Madison Hurst MRN: [id number] Date of Birth: 1960-06-20  Today's Date: 05/01/2020 SLP Individual Time: 8502-7741 SLP Individual Time Calculation (min): 42 min  Short Term Goals: Week 1: SLP Short Term Goal 1 (Week 1): Pt will utilize external memory aids to facilitate recall of daily information with min assist verbal cues SLP Short Term Goal 2 (Week 1): Pt will complete mildly complex functional tasks wtih min assist verbal cues. SLP Short Term Goal 3 (Week 1): Pt will recognize and correct errors in the moment when completing mildly complex tasks with min assist verbal cues. SLP Short Term Goal 4 (Week 1): Pt will selectively attend to functional tasks in a mildly distracting environment for 10 minutes with supervision verbal cues for redirection.  Skilled Therapeutic Interventions: Pt was seen for skilled ST targeting cognitive goals. SLP facilitated session first with a semi-complex checkbook balancing task, given that pt reported she is responsible for this at time. Pt required overall Max  A verbal and visual cueing for problem solving and error awareness, Mod A for organization and working memory within task. She also completed a calendar based emergent awareness task, during which she required Min A verbal cues to identify the most obvious illogical entries on the calendar. Pt still with good awareness of the level of difficulty these tasks are presenting her, and that she is far from cognitive baseline. She also required Moderate cueing for functional basic problem solving repositioning in the best to relieve back pain and when trying to achieve a more upright position. Pt left laying in bed with alarm set and needs within reach. Continue per current plan of care.          Pain Pain Assessment Pain Scale: Faces Faces Pain Scale: Hurts a Madison bit Pain Type: Chronic pain Pain Location: Back Pain  Descriptors / Indicators: Grimacing;Discomfort Pain Intervention(s): Repositioned Multiple Pain Sites: No  Therapy/Group: Individual Therapy  Madison Hurst 05/01/2020, 7:24 AM

## 2020-05-01 NOTE — Progress Notes (Signed)
Patient ID: Madison Hurst, female   DOB: [birth year], 60 y.o.   MRN: [id number] Met with the patient to review role of the nurse CM and collaboration with the SW to address educational needs and facilitate preparation for discharge. Reviewed secondary stroke risks including T2 DM with A1C of 6.1, HTN, HLD (LDL 88 with goals of< 70)  and DAPT (ASA + Eliquis) x 3 months per MD then back to Plavix pending complications. Patient given handouts and handbooks for spouse reference including DASH diet, cooking with less salt and Vit D deficiency dietary modifications. No questions noted at present. Continue to follow along to discharge to address educational needs. Margarito Liner

## 2020-05-01 NOTE — Patient Care Conference (Signed)
Inpatient RehabilitationTeam Conference and Plan of Care Update Date: 05/01/2020   Time: 10:49 AM    Patient Name: Madison Hurst      Medical Record Number: [id number]  Date of Birth: 01-19-1961 Sex: Female         Room/Bed: 4W03C/4W03C-01 Payor Info: Payor: [employer] MEDICARE / Plan: [employer] MEDICARE CHOICE PPO / Product Type: *No Product type* /    Admit Date/Time:  04/26/2020  6:29 PM  Primary Diagnosis:  Right middle cerebral artery stroke [facility])  Hospital Problems: Principal Problem:   Right middle cerebral artery stroke [facility]) Active Problems:   Depression with anxiety    Expected Discharge Date: Expected Discharge Date: 05/14/20  Team Members Present: Physician leading conference: Dr. Claudette Laws Care Coodinator Present: Chana Bode, RN, BSN, CRRN;Christina Vita Barley, BSW Nurse Present: Willey Blade, RN PT Present: Casimiro Needle, PT OT Present: Perrin Maltese, OT SLP Present: Suzzette Righter, CF-SLP PPS Coordinator present : Fae Pippin, [place], PT     Current Status/Progress Goal Weekly Team Focus  Bowel/Bladder   Pt is continent/incontinent x2.  Pt will be continent x2.  Assess q shift and prn.   Swallow/Nutrition/ Hydration             ADL's   Min assist for UB bathing with mod assist for LB bathing.  Mod assist for UB dressing with max assist for LB dressing.  Mod assist for toilet transfers with use of the RW for support.  Mod to max assist for clothing management and toilet hygiene.  increased confusion at times with moderate RA throughout limiting UE use bilaterally.  contact guard to min assist  selfcare retraining, transfer training AE/DME education, therapeutic exercise, neuromuscular re-education, pt/family education   Mobility   min/mod assist bed mobility, mod assist sit<>stand and stand pivot transfers, mod assist gait up to 58ft using HHA, pt has hx of RA affecting functional use of B UEs that will contribute to her ability to  safely use an AD  CGA overall at ambulatory level  activity tolerance, bed mobility, transfer training, gait training, dynamic sitting and standing balance, pt education   Communication             Safety/Cognition/ Behavioral Observations  Mod A  Supervision  semi-complex problem solving, recall with aids/strategies (working and short term), emergent awareness, selective attention   Pain   Pt does not report pain at this time.  Pain will remain <3.      Skin   Pt has BUE edema that is weepy at times; no skin breakdown.  Skin will remain intact and free from infection.  Assess q shift and prn.     Discharge Planning:  Discharging home with spouse. 1 level home, 1 step to enter   Team Discussion: Left hemiparesis post infarct with RA flair in hands and feet. DM and HTN controlled. Continent/incontinent.  Progress limited by poor problem solving, STMD and working memory deficits.  Patient on target to meet rehab goals: yes, currently min assist for upper body bathing and max assist upper body dressing and mod - max assist for lower body dressing. Mod assist for toilet transfers and max assist for clothes management. Requires mod assist for gait/transfers due to poor balance.  *See Care Plan and progress notes for long and short-term goals.   Revisions to Treatment Plan:  Experimenting with adaptive equipment  Teaching Needs: Transfers, toileting, medications, secondary stroke risk management, etc.  Current Barriers to Discharge: Decreased caregiver support and Home  enviroment access/layout  Possible Resolutions to Barriers: Family education with spouse and son     Medical Summary Current Status: Rheumatoid arthritis with flareup in the hands and feet, recent CVA  Barriers to Discharge: Medical stability   Possible Resolutions to [employer] Focus: Prednisone burst, will need to start taper as soon as symptoms permit   Continued Need for Acute Rehabilitation Level of  Care: The patient requires daily medical management by a physician with specialized training in physical medicine and rehabilitation for the following reasons: Direction of a multidisciplinary physical rehabilitation program to maximize functional independence : Yes Medical management of patient stability for increased activity during participation in an intensive rehabilitation regime.: Yes Analysis of laboratory values and/or radiology reports with any subsequent need for medication adjustment and/or medical intervention. : Yes   I attest that I was present, lead the team conference, and concur with the assessment and plan of the team.   Chana Bode B 05/01/2020, 2:36 PM

## 2020-05-02 LAB — CBC
HCT: 41.9 % (ref 36.0–46.0)
Hemoglobin: 13.1 g/dL (ref 12.0–15.0)
MCH: 32 pg (ref 26.0–34.0)
MCHC: 31.3 g/dL (ref 30.0–36.0)
MCV: 102.4 fL — ABNORMAL HIGH (ref 80.0–100.0)
Platelets: 207 10*3/uL (ref 150–400)
RBC: 4.09 MIL/uL (ref 3.87–5.11)
RDW: 18.6 % — ABNORMAL HIGH (ref 11.5–15.5)
WBC: 9.3 10*3/uL (ref 4.0–10.5)
nRBC: 0.2 % (ref 0.0–0.2)

## 2020-05-02 LAB — PROTIME-INR
INR: 1.7 — ABNORMAL HIGH (ref 0.8–1.2)
Prothrombin Time: 19.3 seconds — ABNORMAL HIGH (ref 11.4–15.2)

## 2020-05-02 LAB — GLUCOSE, CAPILLARY
Glucose-Capillary: 97 mg/dL (ref 70–99)
Glucose-Capillary: 146 mg/dL — ABNORMAL HIGH (ref 70–99)
Glucose-Capillary: 175 mg/dL — ABNORMAL HIGH (ref 70–99)
Glucose-Capillary: 104 mg/dL — ABNORMAL HIGH (ref 70–99)

## 2020-05-02 MED ORDER — PHYTONADIONE 5 MG PO TABS
2.5000 mg | ORAL_TABLET | Freq: Once | ORAL | Status: AC
  Administered 2020-05-02: 2.5 mg via ORAL
  Filled 2020-05-02: qty 1

## 2020-05-02 MED ORDER — PREDNISONE 5 MG PO TABS
5.0000 mg | ORAL_TABLET | Freq: Every day | ORAL | Status: DC
  Administered 2020-05-03 – 2020-05-16 (×14): 5 mg via ORAL
  Filled 2020-05-02 (×14): qty 1

## 2020-05-02 MED ORDER — PANTOPRAZOLE SODIUM 40 MG PO TBEC
40.0000 mg | DELAYED_RELEASE_TABLET | Freq: Every day | ORAL | Status: DC
  Administered 2020-05-02 – 2020-05-16 (×15): 40 mg via ORAL
  Filled 2020-05-02 (×15): qty 1

## 2020-05-02 NOTE — Progress Notes (Signed)
Occupational Therapy Session Note  Patient Details  Name: Tayloranne Lekas MRN: [id number] Date of Birth: 1961-01-03  Today's Date: 05/02/2020 OT Individual Time: 4656-8127 OT Individual Time Calculation (min): 53 min    Short Term Goals: Week 1:  OT Short Term Goal 1 (Week 1): Pt will complete UB dressing with min assist to donn pullover shirt. OT Short Term Goal 2 (Week 1): Pt will complete toilet transfer with min assist stand pivot. OT Short Term Goal 3 (Week 1): Pt will complete LB dressing with min assist for donning brief and pants only sit to stand.  Skilled Therapeutic Interventions/Progress Updates:    Pt was in the bed to start session with spouse present.  Per nursing in the room they stated pt had stat labs as well as likely starting and IV secondary to blood in her stool.  She did not want to get up OOB but agreed to sit EOB and work on self feeding tasks.  She was able to complete supine to sit EOB X2 during session with overall min assist.  She was able to hold her sandwich with BUEs to eat.  Therapist provided foam grips to place over her spoon while working on eating her ice cream in order to ease her grip.  She was able to use the LUE to eat, but did exhibit dropping of the spoon on one occasion.  Finished session with return to supine per her request with the call button and phone in reach.  Pt's husband present at bedside as well.   Therapy Documentation Precautions:  Precautions Precautions: Fall,Other (comment) Precaution Comments: EF 20-25%, L hemiparesis, RA (hands, shoulders, and ankles) Restrictions Weight Bearing Restrictions: No  Pain: Pain Assessment Pain Scale: Faces Pain Score: 0-No pain ADL: See Care Tool Section for some details   Therapy/Group: Individual Therapy  Marks Scalera OTR/L 05/02/2020, 3:45 PM

## 2020-05-02 NOTE — Progress Notes (Signed)
[facility] PHYSICAL MEDICINE & REHABILITATION PROGRESS NOTE   Subjective/Complaints:  No issues overnite, the pt has joint pain in fingers and ankles Discussed stroke risk associated with NSAID meds and that she should not take them any longer     Review of systems denies chest pain shortness of breath nausea vomiting diarrhea or constipation, + swelling in bilateral ankles, feet and hands  Objective:   No results found. No results for input(s): WBC, HGB, HCT, PLT in the last 72 hours. No results for input(s): NA, K, CL, CO2, GLUCOSE, BUN, CREATININE, CALCIUM in the last 72 hours.  Intake/Output Summary (Last 24 hours) at 05/02/2020 0910 Last data filed at 05/02/2020 0700 Gross per 24 hour  Intake 440 ml  Output 2 ml  Net 438 ml        Physical Exam: Vital Signs Blood pressure 108/84, pulse 81, temperature 97.9 F (36.6 C), temperature source Oral, resp. rate 17, height 5' (1.524 m), weight 56.6 kg, SpO2 92 %.  General: No acute distress Mood and affect are appropriate Heart: Regular rate and rhythm no rubs murmurs or extra sounds Lungs: Clear to auscultation, breathing unlabored, no rales or wheezes Abdomen: Positive bowel sounds, soft nontender to palpation, nondistended Extremities: No clubbing, cyanosis, or edema Skin: No evidence of breakdown, no evidence of rash  Neurologic:  motor strength is 4/5 in bilateral deltoid, bicep, tricep, grip, hip flexor, knee extensors, ankle dorsiflexor and plantar flexor, patient is limited with shoulder range of motion  Musculoskeletal: Bilateral shoulder abduction and forward flexion is 75% of normal ,reduced pain and swelling in digits 2 3 bilaterally at the MCPs, tenderness over the metatarsal phalangeal joints as well, pain to palpation and ROM but no swelling in ankles    Assessment/Plan: 1. Functional deficits which require 3+ hours per day of interdisciplinary therapy in a comprehensive inpatient rehab setting.  Physiatrist  is providing close team supervision and 24 hour management of active medical problems listed below.  Physiatrist and rehab team continue to assess barriers to discharge/monitor patient progress toward functional and medical goals  Care Tool:  Bathing    Body parts bathed by patient: Right arm,Left arm,Chest,Abdomen,Front perineal area,Right upper leg,Left upper leg,Face   Body parts bathed by helper: Buttocks,Left lower leg,Right lower leg,Right arm,Left arm     Bathing assist Assist Level: Moderate Assistance - Patient 50 - 74%     Upper Body Dressing/Undressing Upper body dressing   What is the patient wearing?: Pull over shirt    Upper body assist Assist Level: Moderate Assistance - Patient 50 - 74%    Lower Body Dressing/Undressing Lower body dressing      What is the patient wearing?: Pants     Lower body assist Assist for lower body dressing: Moderate Assistance - Patient 50 - 74%     Toileting Toileting    Toileting assist Assist for toileting: Maximal Assistance - Patient 25 - 49%     Transfers Chair/bed transfer  Transfers assist     Chair/bed transfer assist level: Minimal Assistance - Patient > 75% Chair/bed transfer assistive device: Armrests,Walker   Locomotion Ambulation   Ambulation assist      Assist level: Moderate Assistance - Patient 50 - 74% Assistive device: Walker-rolling Max distance: 10'   Walk 10 feet activity   Assist     Assist level: Moderate Assistance - Patient - 50 - 74% Assistive device: Walker-rolling   Walk 50 feet activity   Assist Walk 50 feet with 2 turns  activity did not occur: Safety/medical concerns         Walk 150 feet activity   Assist Walk 150 feet activity did not occur: N/A (pt does not ambulate this distance at basline)         Walk 10 feet on uneven surface  activity   Assist Walk 10 feet on uneven surfaces activity did not occur: Safety/medical concerns          Wheelchair     Assist Will patient use wheelchair at discharge?: No             Wheelchair 50 feet with 2 turns activity    Assist            Wheelchair 150 feet activity     Assist          Blood pressure 108/84, pulse 81, temperature 97.9 F (36.6 C), temperature source Oral, resp. rate 17, height 5' (1.524 m), weight 56.6 kg, SpO2 92 %.   Medical Problem List and Plan: 1.  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  Antithrombotics: -DVT/anticoagulation: Eliquis 5 mg twice daily.  Lower extremity Dopplers negative             -antiplatelet therapy: Aspirin 81 mg daily 3. Pain Management: Continue Hydrocodone as needed             Monitor with increased exertion 4. Mood: Cymbalta 60 mg daily, Wellbutrin 150 mg daily, Ativan 0.5 mg every 8 hours as needed             -antipsychotic agents: N/A 5. Neuropsych: This patient is capable of making decisions on her own behalf. 6. Skin/Wound Care: Routine skin checks 7. Fluids/Electrolytes/Nutrition: Routine in and outs.  CMP ordered 8.  History of PAF/CAD/PFO.  30-day cardiac event monitor per cardiology services Dr.Kardie Tobb.  Continue Eliquis for now.  If EF improves and no atrial fibrillation in 3 months plan to stop Eliquis and restart Plavix.             Monitor with increased activity 9.  Transaminitis.  Follow-up gastroenterology service Dr. Marina Goodell.               Autoimmune markers pending             1/31: AST and ALT reviewed and elevated to 83 and 334 respectively. Both are trending down well, repeat weekly.   10.  Hyperlipidemia.  Crestor on hold due to elevated LFTs. 11.  Rheumatoid arthritis: Low-dose prednisone as well as weekly Orencia (was on Ansid at home flurbiprofen)             Having rheumatoid flare  20mg   prednisone x 2d,  reduce to 10mg  today  12.  Diabetes mellitus.  Hemoglobin A1c 6.1.  SSI.  Patient on Metformin 500 mg daily prior to admission.  Resume as needed          CBG (last 3)  Recent Labs    05/01/20 1626 05/01/20 2106 05/02/20 0602  GLUCAP 216* 172* 97  fair control despite increased prednisone  13.  Hypothyroidism: Synthroid 14.  Essential hypertension.. Blood pressure had remained a bit soft Toprol-XL resumed  at 12.5 mg daily 04/26/2020  1/31: BP well controlled, continue current regimen.            Vitals:   05/01/20 1906 05/02/20 0455  BP: 112/82 108/84  Pulse: 84 81  Resp: 16 17  Temp: 97.8 F (36.6 C) 97.9 F (36.6 C)  SpO2: 91% 92%  controlled 2/2  15.  Constipation but multiple stools post sorbitol 1/29 16. Insomnia: melatonin 3mg  started HS  LOS: 6 days A FACE TO FACE EVALUATION WAS PERFORMED  Erick Colace 05/02/2020, 9:10 AM

## 2020-05-02 NOTE — Progress Notes (Signed)
Speech Language Pathology Daily Session Note  Patient Details  Name: Madison Hurst MRN: [id number] Date of Birth: 02-16-1961  Today's Date: 05/02/2020 SLP Individual Time: 1000-1056 SLP Individual Time Calculation (min): 56 min  Short Term Goals: Week 1: SLP Short Term Goal 1 (Week 1): Pt will utilize external memory aids to facilitate recall of daily information with min assist verbal cues SLP Short Term Goal 2 (Week 1): Pt will complete mildly complex functional tasks wtih min assist verbal cues. SLP Short Term Goal 3 (Week 1): Pt will recognize and correct errors in the moment when completing mildly complex tasks with min assist verbal cues. SLP Short Term Goal 4 (Week 1): Pt will selectively attend to functional tasks in a mildly distracting environment for 10 minutes with supervision verbal cues for redirection.  Skilled Therapeutic Interventions: Pt was seen for skilled ST targeting cognitive skills. Upon arrival, pt with poor recall of her morning so far and reported it is difficult for her to keep days straight. She was disoriented to time. SLP reoriented pt to time as well as AM activities per documentation and schedule and then initiated use of memory notebook as compensatory strategy to aid in more functional accurate recall and carryover of daily activities and therapeutic techniques. Monthly Feb calendar also provided for pt to track date. SLP further facilitated session with a semi-complex monthly calendar/scheduling task. Pt required extra time for processing written information and translating it, functionally, in addition to Min A verbal and visual cues for organizational strategies, problem solving, and working memory within task. She was more accurate with this task today than in previous sessions. Pt left seated upright in bed with alarm set and needs within reach. Continue per current plan of care.          Pain Pain Assessment Pain Scale: 0-10 Pain Score:  0-No pain  Therapy/Group: Individual Therapy  [name] 05/02/2020, 7:27 AM

## 2020-05-02 NOTE — Progress Notes (Signed)
Patient requested to use restroom. RN toileted patient. Upon removing brief, RN noticed moderate amount of dark red blood. Jesusita Oka, PA was informed as well as Consulting civil engineer, Doctor, general practice. Upon getting patient off toilet, large amount of loose bloody stool was present with strong foul odor present. Patient complains of feeling slightly dizzy and denies any abdominal pain or pain anywhere else. Awaiting new orders if any from PA.

## 2020-05-02 NOTE — Progress Notes (Signed)
Physical Therapy Session Note  Patient Details  Name: Madison Hurst MRN: [id number] Date of Birth: [birth year]  Today's Date: 05/02/2020 PT Individual Time: 1537-1650 PT Individual Time Calculation (min): 73 min   Short Term Goals: Week 1:  PT Short Term Goal 1 (Week 1): Pt will perform supine<>sit with CGA PT Short Term Goal 2 (Week 1): Pt will perform sit<>stand using LRAD with min assist PT Short Term Goal 3 (Week 1): Pt will perform bed<>chair transfers using LRAD with min assist PT Short Term Goal 4 (Week 1): Pt will ambulate at least 72ft using LRAD with min assist  Skilled Therapeutic Interventions/Progress Updates:    Pt received supine in bed with NT present as pt reporting need to use bathroom. Pt agreeable to therapy session therefore therapist assumed care of pt. Supine>sitting L EOB, HOB slightly elevated and using bedrail, min assist for trunk upright - moves at slow speed requiring increased time. Sit>stand EOB>RW min assist for lifting and balance. R stand pivot to w/c using RW min assist for balance due to posterior lean. Transported in/out bathroom in w/c. R stand pivot w/c>BSC over toilet using grab bar min assist - standing with UE support on grab bar total assist brief management. Continent of small BM that contained blood clots Barnie Del, RN notified. Standing UE support on grab bar with min assist for balance during total assist posterior peri-care for cleanliness. Sit>stand at sink and standing hand hygiene with CGA for steadying.  Pt agreeable to continue therapy session. Transported to/from gym in w/c for time management and energy conservation. Assessed seated vitals: BP 102/75 (MAP 85), HR 83bpm. Sit<>stands to/from RW with varying CGA to min assist due to posterior lean with increased fatigue - cuing for safe hand placement when using RW. Gait training 70ft using RW min assist for balance - continues to have significantly decreased speed of movement and step  lengths with shuffled pattern and lack of full R/L weight shift onto stance limb in order to advance opposite LE - cuing for AD management. Standing balance via alternate B LE foot taps on 2" step using B UE support on RW - min assist for balance due to L posterior lean - pt reports increased L ankle pain during SLS on that side therefore discontinued this task. Seated alternate B LE foot taps on 2" step for seated balance and activity tolerance. Dynamic standing balance and standing tolerance task via cross body reaching to place small bean bags to from varying height surfaces - CGA/min assist for balance due to intermittent posterior lean.  Static standing balance task, no UE support but RW in place for safety and pt comfort, during the following:  - horizontal head turns x10reps  - static standing x30 seconds with pt demoing L posterior lean with R lateral trunk flexion - vertical head nods x10reps progressing towards min assist for balance due to posterior lean Continues to demo L weight shift with pelvic dropped on R side with R lateral trunk flexion - cuing for improve midline and upright posture.  Standing balance, BUE coordination, and dual-cognitive task to perform card matching game with CGA progressing towards min assist with fatigue. Pt requires frequent seated rest breaks between tasks due to LE and overall fatigue. Gait training ~68ft back to w/c using RW with min assist for balance - continues to demo above gait impairments - requires max sequential cuing for LE stepping and AD management when turning to sit in w/c. Transported back to room  and pt reports need to use bathroom. R stand pivot w/c>BSC over toilet using grab bar min assist. Pt left sitting on BSC over toilet with NT present to assume care of patient.  Therapy Documentation Precautions:  Precautions Precautions: Fall,Other (comment) Precaution Comments: EF 20-25%, L hemiparesis, RA (hands, shoulders, and  ankles) Restrictions Weight Bearing Restrictions: No  Pain: Reports pain in bilateral ankles, shoulders, and wrists rated as follows:  - 8/10 L ankle - increased pain with WBing during therapy tasks - limited single leg stance times - 6/10 R ankle - 8/10 L shoulder - 7/10 R shoulder - 7/10 L wrist - 7/10 R wrist Modified therapeutic interventions and provided frequent rest breaks for pain management - pt reports medication administered earlier.  Therapy/Group: Individual Therapy  Ginny Forth , PT, DPT, CSRS  05/02/2020, 3:20 PM

## 2020-05-02 NOTE — Progress Notes (Signed)
Nutrition Follow-up  RD working remotely.  DOCUMENTATION CODES:   Not applicable  INTERVENTION:   - Continue Ensure Enlive po BID, each supplement provides 350 kcal and 20 grams of protein  - Continue MVI with minerals daily  - Encourage adequate PO intake  NUTRITION DIAGNOSIS:   Increased nutrient needs related to other (extensive therapies) as evidenced by estimated needs.  Ongoing  GOAL:   Patient will meet greater than or equal to 90% of their needs  Progressing  MONITOR:   PO intake,Supplement acceptance,Weight trends,Labs,I & O's  REASON FOR ASSESSMENT:   Malnutrition Screening Tool    ASSESSMENT:   Patient with PMH significant for substernal goiter, CAD, HTN, HLD, and DM. 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  Noted target d/c date of 2/15.  Spoke with pt and pt's husband via phone call to room. Spoke with pt's husband first as pt was having her blood sugar checked and was unavailable. Pt's husband reports that pt is eating okay but that she doesn't eat much at baseline. He reports that she is consuming her Ensure supplements.  Spoke with pt who reports that her appetite is fairly normal. She agrees with her husband's comments above and states that she is not a "big eater" at baseline. She has been consuming her Ensure supplements and enjoys them. Will continue these BID.  RD discussed importance of adequate kcal and protein intake in promoting healing and maintaining lean muscle mass. Pt expresses understanding.  No new weights available since 04/26/20. Per RN edema assessment, pt continues to have mild pitting edema to BUE and BLE.  Meal Completion: 40-90% x last 8 documented meals (averaging 61%)  Medications reviewed and include: Ensure Enlive BID, folic acid, SSI, melatonin, metformin, MVI with minerals, prednisone  Labs reviewed: elevated LFTs (trending down) CBG's: 97-216 x 24 hours  NUTRITION - FOCUSED PHYSICAL  EXAM:  Unable to complete at this time. RD working remotely.  Diet Order:   Diet Order            Diet heart healthy/carb modified Room service appropriate? Yes; Fluid consistency: Thin  Diet effective now                 EDUCATION NEEDS:   Education needs have been addressed  Skin:  Skin Assessment: Reviewed RN Assessment  Last BM:  04/29/20  Height:   Ht Readings from Last 1 Encounters:  04/26/20 5' (1.524 m)    Weight:   Wt Readings from Last 1 Encounters:  04/26/20 56.6 kg    BMI:  Body mass index is 24.37 kg/m.  Estimated Nutritional Needs:   Kcal:  1700-1900 kcal  Protein:  85-100 grams  Fluid:  >/= 1.7 L/day    Mertie Clause, MS, RD, LDN Inpatient Clinical Dietitian Please see [facility] for contact information.

## 2020-05-02 NOTE — Progress Notes (Signed)
Patient request to use restroom to have BM. Patient toileted and passed large amount of blood clots in toilet. Patient continues to bleed from rectum/anus. Order for CBC STAT obtained. MD notified and order for orthostatic vitals. Vitals obtained and charted in patient chart. Patient is complaining of continued dizziness and now needs 2 assist stand pivot due to increased weakness. Patient also pallor.

## 2020-05-02 NOTE — Progress Notes (Signed)
Physical Therapy Weekly Progress Note  Patient Details  Name: Madison Hurst MRN: [id number] Date of Birth: Oct 07, 1960  Beginning of progress report period: April 27, 2020 End of progress report period: May 02, 2020  Patient has met 0 of 4 short term goals.  Madison Hurst has made slower than anticipated progress this week due to increased lethargy and medical complications (bloody stools) limiting participation in therapy sessions. She is performing supine<>sit with min assist, sit<>stands and stand pivot transfers using RW with min assist/ intermittent mod assist, and ambulating up to 84f using RW with min/mod assist. She continues to demonstrate significant gait impairments likely associated with her RA with bilateral ankle pain and overall decreased joint ROM resulting in a very slow, small step, shuffled gait. She continues to demonstrate impaired balance with posterior lean bias and poor balance recovery strategies.   Patient continues to demonstrate the following deficits muscle weakness and muscle joint tightness, decreased cardiorespiratoy endurance, impaired timing and sequencing, unbalanced muscle activation and decreased coordination,  , decreased midline orientation, decreased initiation, decreased attention, decreased awareness, decreased problem solving, decreased safety awareness, decreased memory and delayed processing and decreased sitting balance, decreased standing balance, decreased postural control, hemiplegia and decreased balance strategies and therefore will continue to benefit from skilled PT intervention to increase functional independence with mobility.  Patient progressing toward long term goals..  Continue plan of care.  PT Short Term Goals Week 1:  PT Short Term Goal 1 (Week 1): Pt will perform supine<>sit with CGA PT Short Term Goal 1 - Progress (Week 1): Progressing toward goal PT Short Term Goal 2 (Week 1): Pt will perform sit<>stand using LRAD  with min assist PT Short Term Goal 2 - Progress (Week 1): Progressing toward goal PT Short Term Goal 3 (Week 1): Pt will perform bed<>chair transfers using LRAD with min assist PT Short Term Goal 3 - Progress (Week 1): Progressing toward goal PT Short Term Goal 4 (Week 1): Pt will ambulate at least 326fusing LRAD with min assist PT Short Term Goal 4 - Progress (Week 1): Progressing toward goal Week 2:  PT Short Term Goal 1 (Week 2): Pt will perform supine<>sit with CGA PT Short Term Goal 2 (Week 2): Pt will consistently perform sit<>stands using LRAD with min assist PT Short Term Goal 3 (Week 2): Pt will consistently perform bed<>chair transfers using LRAD with min assist PT Short Term Goal 4 (Week 2): Pt will ambulate at least 3064fsing LRAD with min assist PT Short Term Goal 5 (Week 2): Pt will ascend/descend 2 (6" height) steps with min assist  Skilled Therapeutic Interventions/Progress Updates:  Ambulation/gait training;Community reintegration;DME/adaptive equipment instruction;Neuromuscular re-education;Psychosocial support;Stair training;UE/LE Strength taining/ROM;Balance/vestibular training;Discharge planning;Functional electrical stimulation;Pain management;Skin care/wound management;Therapeutic Activities;UE/LE Coordination activities;Cognitive remediation/compensation;Disease management/prevention;Functional mobility training;Patient/family education;Visual/perceptual remediation/compensation;Therapeutic Exercise;Splinting/orthotics   Therapy Documentation Precautions:  Precautions Precautions: Fall,Other (comment) Precaution Comments: EF 20-25%, L hemiparesis, RA (hands, shoulders, and ankles) Restrictions Weight Bearing Restrictions: No  CarTawana ScalePT, DPT, CSRS  05/02/2020, 6:42 PM

## 2020-05-03 LAB — PROTIME-INR
INR: 1.5 — ABNORMAL HIGH (ref 0.8–1.2)
Prothrombin Time: 17.4 seconds — ABNORMAL HIGH (ref 11.4–15.2)

## 2020-05-03 LAB — CBC
HCT: 31.9 % — ABNORMAL LOW (ref 36.0–46.0)
Hemoglobin: 10.3 g/dL — ABNORMAL LOW (ref 12.0–15.0)
MCH: 32.9 pg (ref 26.0–34.0)
MCHC: 32.3 g/dL (ref 30.0–36.0)
MCV: 101.9 fL — ABNORMAL HIGH (ref 80.0–100.0)
Platelets: 189 10*3/uL (ref 150–400)
RBC: 3.13 MIL/uL — ABNORMAL LOW (ref 3.87–5.11)
RDW: 18.9 % — ABNORMAL HIGH (ref 11.5–15.5)
WBC: 7.6 10*3/uL (ref 4.0–10.5)
nRBC: 0.3 % — ABNORMAL HIGH (ref 0.0–0.2)

## 2020-05-03 LAB — GLUCOSE, CAPILLARY
Glucose-Capillary: 75 mg/dL (ref 70–99)
Glucose-Capillary: 131 mg/dL — ABNORMAL HIGH (ref 70–99)
Glucose-Capillary: 92 mg/dL (ref 70–99)
Glucose-Capillary: 143 mg/dL — ABNORMAL HIGH (ref 70–99)
Glucose-Capillary: 76 mg/dL (ref 70–99)

## 2020-05-03 NOTE — Progress Notes (Signed)
Physical Therapy Session Note  Patient Details  Name: Madison Hurst MRN: [id number] Date of Birth: [birth year]  Today's Date: 05/03/2020 PT Individual Time: 1010-1034 PT Individual Time Calculation (min): 24 min   Short Term Goals: Week 2:  PT Short Term Goal 1 (Week 2): Pt will perform supine<>sit with CGA PT Short Term Goal 2 (Week 2): Pt will consistently perform sit<>stands using LRAD with min assist PT Short Term Goal 3 (Week 2): Pt will consistently perform bed<>chair transfers using LRAD with min assist PT Short Term Goal 4 (Week 2): Pt will ambulate at least 51f using LRAD with min assist PT Short Term Goal 5 (Week 2): Pt will ascend/descend 2 (6" height) steps with min assist  Skilled Therapeutic Interventions/Progress Updates:   Pt received sitting in WC and agreeable to PT. Pt transported to rehab gym. BP assessed. Sitting: 87/75, HR 92. Standing: 96/82. Pt performed gait training with RW 2x181fwith min assist. Cues for increased step length on the R>L, with only moderate following for each instruction. PT assessed VS following each bout of gait training in sitting: 95/76, HR 92 and 98/66, HR [email or web address] reported on each transfer, which resolved in 10-15 seconds. PT instructed p tin WC mobility x 3046fith mod assist, pt unable to maintain grasp on the L side due body size.  Patient returned to room and [facility] and all needs met.            Therapy Documentation Precautions:  Precautions Precautions: Fall,Other (comment) Precaution Comments: EF 20-25%, L hemiparesis, RA (hands, shoulders, and ankles) Restrictions Weight Bearing Restrictions: No Pain: Pain Assessment Pain Scale: Faces Faces Pain Scale: No hurt    Therapy/Group: Individual Therapy  AusLorie Phenix4/2022, 6:15 PM

## 2020-05-03 NOTE — Progress Notes (Signed)
[place] PHYSICAL MEDICINE & REHABILITATION PROGRESS NOTE   Subjective/Complaints: No abd or rectal pain  Rectal bleeding has slowed down but not stopped after Vit K Pt on Eliquis and ASA, liver failure with transaminitis and elevated INR (1.7 yesterday)   GI service is aware and did not rec any invasive proce3dures at thsi time due to recent stroek and Eliquis on board   Patient denies history of hemorrhoids.  Review of systems denies chest pain shortness of breath nausea vomiting diarrhea or constipation, + swelling in bilateral ankles, feet and hands  Objective:   No results found. Recent Labs    05/02/20 1317 05/03/20 0540  WBC 9.3 7.6  HGB 13.1 10.3*  HCT 41.9 31.9*  PLT 207 189   No results for input(s): NA, K, CL, CO2, GLUCOSE, BUN, CREATININE, CALCIUM in the last 72 hours.  Intake/Output Summary (Last 24 hours) at 05/03/2020 0839 Last data filed at 05/03/2020 0700 Gross per 24 hour  Intake 200 ml  Output -  Net 200 ml        Physical Exam: Vital Signs Blood pressure 99/73, pulse 86, temperature 98.1 F (36.7 C), resp. rate 14, height 5' (1.524 m), weight 56.6 kg, SpO2 99 %.   General: No acute distress Mood and affect are appropriate Heart: Regular rate and rhythm no rubs murmurs or extra sounds Lungs: Clear to auscultation, breathing unlabored, no rales or wheezes Abdomen: Positive bowel sounds, soft nontender to palpation, nondistended Extremities: No clubbing, cyanosis, or edema Skin: No evidence of breakdown, no evidence of rash  Neurologic:  motor strength is 4/5 in bilateral deltoid, bicep, tricep, grip, hip flexor, knee extensors, ankle dorsiflexor and plantar flexor, patient is limited with shoulder range of motion  Musculoskeletal: Bilateral shoulder abduction and forward flexion is 75% of normal ,reduced pain and swelling in digits 2 3 bilaterally at the MCPs, tenderness over the metatarsal phalangeal joints as well, pain to palpation and ROM  but no swelling in ankles    Assessment/Plan: 1. Functional deficits which require 3+ hours per day of interdisciplinary therapy in a comprehensive inpatient rehab setting.  Physiatrist is providing close team supervision and 24 hour management of active medical problems listed below.  Physiatrist and rehab team continue to assess barriers to discharge/monitor patient progress toward functional and medical goals  Care Tool:  Bathing    Body parts bathed by patient: Right arm,Left arm,Chest,Abdomen,Front perineal area,Right upper leg,Left upper leg,Face   Body parts bathed by helper: Buttocks,Left lower leg,Right lower leg,Right arm,Left arm     Bathing assist Assist Level: Moderate Assistance - Patient 50 - 74%     Upper Body Dressing/Undressing Upper body dressing   What is the patient wearing?: Pull over shirt    Upper body assist Assist Level: Moderate Assistance - Patient 50 - 74%    Lower Body Dressing/Undressing Lower body dressing      What is the patient wearing?: Pants     Lower body assist Assist for lower body dressing: Moderate Assistance - Patient 50 - 74%     Toileting Toileting    Toileting assist Assist for toileting: Maximal Assistance - Patient 25 - 49%     Transfers Chair/bed transfer  Transfers assist     Chair/bed transfer assist level: Minimal Assistance - Patient > 75% Chair/bed transfer assistive device: Licensed conveyancer   Ambulation assist      Assist level: Minimal Assistance - Patient > 75% Assistive device: Walker-rolling Max distance: 62ft  Walk 10 feet activity   Assist     Assist level: Minimal Assistance - Patient > 75% Assistive device: Walker-rolling   Walk 50 feet activity   Assist Walk 50 feet with 2 turns activity did not occur: Safety/medical concerns         Walk 150 feet activity   Assist Walk 150 feet activity did not occur: N/A (pt does not ambulate this distance at  basline)         Walk 10 feet on uneven surface  activity   Assist Walk 10 feet on uneven surfaces activity did not occur: Safety/medical concerns         Wheelchair     Assist Will patient use wheelchair at discharge?: No             Wheelchair 50 feet with 2 turns activity    Assist            Wheelchair 150 feet activity     Assist          Blood pressure 99/73, pulse 86, temperature 98.1 F (36.7 C), resp. rate 14, height 5' (1.524 m), weight 56.6 kg, SpO2 99 %.   Medical Problem List and Plan: 1.  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  Antithrombotics: -DVT/anticoagulation: Eliquis 5 mg twice daily.  Lower extremity Dopplers negative             -antiplatelet therapy: Aspirin 81 mg daily 3. Pain Management: Continue Hydrocodone as needed             Monitor with increased exertion 4. Mood: Cymbalta 60 mg daily, Wellbutrin 150 mg daily, Ativan 0.5 mg every 8 hours as needed             -antipsychotic agents: N/A 5. Neuropsych: This patient is capable of making decisions on her own behalf. 6. Skin/Wound Care: Routine skin checks 7. Fluids/Electrolytes/Nutrition: Routine in and outs.  CMP ordered 8.  History of PAF/CAD/PFO.  30-day cardiac event monitor per cardiology services Dr.Kardie Tobb.  Continue Eliquis for now.  If EF improves and no atrial fibrillation in 3 months plan to stop Eliquis and restart Plavix.             Monitor with increased activity 9.  Transaminitis.  Follow-up gastroenterology service Dr. Marina Goodell.               Autoimmune markers pending             1/31: AST and ALT reviewed and elevated to 83 and 334 respectively. Both are trending down well, repeat weekly.  PT was still up yesterday  10.  Hyperlipidemia.  Crestor on hold due to  elevated LFTs. 11.  Rheumatoid arthritis: Low-dose prednisone as well as weekly Orencia (was on Ansid at home flurbiprofen)             Having rheumatoid flare  20mg  prednisone x 2d,  reduce to 10mg  on 2/3 and 5mg  on 2/4 may have casued gastritis 12.  Diabetes mellitus.  Hemoglobin A1c 6.1.  SSI.  Patient on Metformin 500 mg daily prior to admission.  Resume as needed  CBG (last 3)  Recent Labs    05/02/20 2200 05/03/20 0501 05/03/20 0538  GLUCAP 104* 75 131*  fair control despite increased prednisone- pred down to  today , monitor   13.  Hypothyroidism: Synthroid 14.  Essential hypertension.. Blood pressure had remained a bit soft Toprol-XL resumed at 12.5 mg daily 04/26/2020  1/31: BP well controlled, continue current regimen.            Vitals:   05/02/20 1954 05/03/20 0509  BP: 104/69 99/73  Pulse: 83 86  Resp: 18 14  Temp: 98.2 F (36.8 C) 98.1 F (36.7 C)  SpO2: 100% 99%  controlled 2/2  15.  Constipation but multiple stools post sorbitol 1/29 16. Insomnia: melatonin  started HS 17.  GI bleed with drop in Hgb, suspect lower GI bleed given bright red blood, no further bloody stools.  Pt is anticoagulated with Eliquis , has ASA for anti plt and in addition had elevated INR due to liver disease-bleeding diminished after vit K repeat PT/ INR if still >1.4 will repeat Vit K 2.5mg  x 1 WIll need GI f/u as OP, will monitor Hgb on daily basis , transfuse if hgb<7 LOS: 7 days A FACE TO FACE EVALUATION WAS PERFORMED  Erick Colace 05/03/2020, 8:39 AM

## 2020-05-03 NOTE — Progress Notes (Signed)
Upon arousing patient she  verbalized discomfort to left ankle at 4/-5/10 on pain scale, stating when she" move her left ankle" she has pain " I think it may be broken my ankle or something,Im going to talk with the doctor about it this morning".Repositioning of left foot/ankle pain noticing facial grimacers and discomfort to touch, no apparent swelling to area of foot but very sensitive to touch,, perfers  Will speak with oncoming nurse to communicate pain/discomfort issuest.

## 2020-05-03 NOTE — Progress Notes (Signed)
Physical Therapy Session Note  Patient Details  Name: Madison Hurst MRN: [id number] Date of Birth: 1960-06-21  Today's Date: 05/03/2020 PT Individual Time: 1430-1530 PT Individual Time Calculation (min): 60 min   Short Term Goals: Week 1:  PT Short Term Goal 1 (Week 1): Pt will perform supine<>sit with CGA PT Short Term Goal 1 - Progress (Week 1): Progressing toward goal PT Short Term Goal 2 (Week 1): Pt will perform sit<>stand using LRAD with min assist PT Short Term Goal 2 - Progress (Week 1): Progressing toward goal PT Short Term Goal 3 (Week 1): Pt will perform bed<>chair transfers using LRAD with min assist PT Short Term Goal 3 - Progress (Week 1): Progressing toward goal PT Short Term Goal 4 (Week 1): Pt will ambulate at least 48ft using LRAD with min assist PT Short Term Goal 4 - Progress (Week 1): Progressing toward goal Week 2:  PT Short Term Goal 1 (Week 2): Pt will perform supine<>sit with CGA PT Short Term Goal 2 (Week 2): Pt will consistently perform sit<>stands using LRAD with min assist PT Short Term Goal 3 (Week 2): Pt will consistently perform bed<>chair transfers using LRAD with min assist PT Short Term Goal 4 (Week 2): Pt will ambulate at least 78ft using LRAD with min assist PT Short Term Goal 5 (Week 2): Pt will ascend/descend 2 (6" height) steps with min assist Week 3:     Skilled Therapeutic Interventions/Progress Updates:    PAIN  Pt did not quantify, states she is at her baseline due to arthritis.  Treatment to tolerance.  wc seating adjustements/see below.  Pt initially resting in sidelying.  Agreeable to session.  Supine to sit w/use of bed features, min assist.  Socks donned by therapist for time management.   Sit to stand w/min assist and cues for L hand placement on walker, mild post tendency.  stand pivot transfer to wc to R side w/min assist, shuffling steps to turn.    Pt noted to lack postural support in current wc due to sagging slings  of seat back and seat surface.  Leg rests too long for pt.  Pt provided w/16x16 w/solid back and 16/16 cushion w/pressure cut out at rectal area.  Leg rests adjusted to shortest position and added towel build ups to achieve 90/90 position at hips.  Improved access to wheels achieved and pt stated iproved comfort.  Gait 22ft x 2 w/RW very slow cadence wshort shuffling steps and almost no clearance bilat, mild post L tendency.  Cues required for sequencing/safety w/Sit to stand and backing using RW  Sit to stand repeated for functional streengthning task, emphasis on maintaining balance once upright/preventing post wt shift.  Pt transported to room at end of session.  Sit to stand and short distance gait to head of bed w/cga, cues as above.  Pt attempts to lie down via throwing herself onto bed using momentum which resulted in her lying perpendicular in bed w/legs off of edge.  Pt assisted backt o sitting w/mod assist and instructed w/sit to side to supine which she performed w/mod assist.   Pt left supine w/rails up x 4, alarm set, bed in lowest position, and needs in reach.  Therapy Documentation Precautions:  Precautions Precautions: Fall,Other (comment) Precaution Comments: EF 20-25%, L hemiparesis, RA (hands, shoulders, and ankles) Restrictions Weight Bearing Restrictions: No    Therapy/Group: Individual Therapy  Rada Hay, PT   Shearon Balo 05/03/2020, 3:49 PM

## 2020-05-03 NOTE — Progress Notes (Signed)
Speech Language Pathology Daily Session Note  Patient Details  Name: Madison Hurst MRN: [id number] Date of Birth: Oct 26, 1960  Today's Date: 05/03/2020 SLP Individual Time: 1300-1356 SLP Individual Time Calculation (min): 56 min  Short Term Goals: Week 1: SLP Short Term Goal 1 (Week 1): Pt will utilize external memory aids to facilitate recall of daily information with min assist verbal cues SLP Short Term Goal 2 (Week 1): Pt will complete mildly complex functional tasks wtih min assist verbal cues. SLP Short Term Goal 3 (Week 1): Pt will recognize and correct errors in the moment when completing mildly complex tasks with min assist verbal cues. SLP Short Term Goal 4 (Week 1): Pt will selectively attend to functional tasks in a mildly distracting environment for 10 minutes with supervision verbal cues for redirection.  Skilled Therapeutic Interventions: Pt was seen for skilled ST targeting cognitive goals. Her brother was present to observe today. SLP facilitated session with overall Mod A multimodal cueing, including use of pen and paper for calculations during a semi-complex money scenario and budgeting task. Cues were primarily for working memory, problem solving, and error awareness. SLP also introduced a novel card task (blink) during which pt required Mod faded to Min A verbal and visual cues for recall and problem solving throughout task. Pt also noted to exhibit some word substitutions during tasks, which SLP suspects is primarily due to cognitive demand of the tasks at hand, which places increased demand on language. Discussed this with pt. Will continue to monitor. Pt also used her memory notebook to recall and record daily activities with Mod A verbal cues. Pt left sitting in wheelchair with alarm set and needs within reach. Continue per current plan of care.        Pain Pain Assessment Pain Scale: Faces Faces Pain Scale: No hurt  Therapy/Group: Individual  Therapy  [name] 05/03/2020, 7:17 AM

## 2020-05-03 NOTE — Progress Notes (Signed)
Occupational Therapy Weekly Progress Note  Patient Details  Name: Madison Hurst MRN: [id number] Date of Birth: 12-30-1960  Beginning of progress report period: April 27, 2020 End of progress report period: May 03, 2020  Today's Date: 05/03/2020 OT Individual Time: 3710-6269 OT Individual Time Calculation (min): 62 min    Patient has met 1 of 3 short term goals.  Madison Hurst is making slow but steady progress with OT at this time.  She continues to be limited by RA throughout her UEs and feet, which affect her ability to complete functional mobility and dressing tasks.  She is able to complete UB bathing with supervision in sitting.  LB bathing is at a min assist level with use of a LH sponge.  She is able to complete UB dressing at mod assist and LB dressing requires max assist secondary to not being able to reach down to her feet efficiently or use AE effectively secondary to RA in her hands.  She needs min assist for stand pivot transfers with mod assist for functional mobility from her bed to the bathroom.  She continues to exhibit right side lean and posterior lean requiring assist to keep her balance.  At times she has exhibited decreased attention and memory which can be related to her CVA as well as pain medications which can be sedative.  Feel she is making steady progress with OT but will likely need 24 hour min assist at discharge.  Current ELOS is until 2/15 with her spouse providing supervision at home.  Recommend continued CIR level therapy at this time until discharge.   Patient continues to demonstrate the following deficits: muscle weakness and muscle joint tightness, impaired timing and sequencing, unbalanced muscle activation and decreased coordination, decreased awareness, decreased problem solving and decreased memory and decreased sitting balance, decreased standing balance, hemiplegia and decreased balance strategies and therefore will continue to benefit  from skilled OT intervention to enhance overall performance with BADL and Reduce care partner burden.  Patient progressing toward long term goals..  Continue plan of care.  OT Short Term Goals Week 2:  OT Short Term Goal 1 (Week 2): Pt will complete LB dressing with min assist for donning brief and pants only sit to stand. OT Short Term Goal 2 (Week 2): Pt will complete UB dressing with min assist to donn pullover shirt. OT Short Term Goal 3 (Week 2): Pt will complete toileting tasks with min assist sit to stand.  Skilled Therapeutic Interventions/Progress Updates:    Pt worked on toileting, showering and dressing during session.  She needed min assist for transfer from supine to sit EOB.  She then ambulated to the toilet with use of the RW and mod assist.  Increased posterior lean to the right was noted as well.  Mod assist for clothing management and hygiene sit to stand before transferring over to the 3:1 in the shower with min assist.  She needed mod assist for washing her hair secondary to not being able efficiently reach the top or back of their head.  She was able to complete UB bathing with supervision and then LB bathing with min assist.  LH sponge was used for washing her lower legs and feet with assist for thoroughly washing her buttocks.  She completed transfer to the wheelchair from the shower with min assist stand pivot.  UB dressing was performed at mod assist level with mod assist as well for donning new brief and paper scrub pants.  Total assist  was needed for TEDs and gripper socks.  She finished session with completion of combing her hair with min assist.  Call button and phone left in reach with pt sitting up in the wheelchair in preparation for next session with PT.  Safety belt in place as well.    Therapy Documentation Precautions:  Precautions Precautions: Fall,Other (comment) Precaution Comments: EF 20-25%, L hemiparesis, RA (hands, shoulders, and  ankles) Restrictions Weight Bearing Restrictions: No  Pain: Pain Assessment Pain Scale: Faces Faces Pain Scale: Hurts a little bit Pain Type: Chronic pain Pain Location: Foot Pain Orientation: Right;Left Pain Descriptors / Indicators: Discomfort Pain Onset: On-going Pain Intervention(s): Repositioned Multiple Pain Sites: No ADL: See Care Tool Section for some details of mobility and selfcare  Therapy/Group: Individual Therapy  MCGUIRE,JAMES  OTR/L 05/03/2020, 12:48 PM

## 2020-05-04 DIAGNOSIS — R7309 Other abnormal glucose: Secondary | ICD-10-CM

## 2020-05-04 DIAGNOSIS — K922 Gastrointestinal hemorrhage, unspecified: Secondary | ICD-10-CM

## 2020-05-04 DIAGNOSIS — R7401 Elevation of levels of liver transaminase levels: Secondary | ICD-10-CM

## 2020-05-04 DIAGNOSIS — I10 Essential (primary) hypertension: Secondary | ICD-10-CM

## 2020-05-04 DIAGNOSIS — G479 Sleep disorder, unspecified: Secondary | ICD-10-CM

## 2020-05-04 LAB — GLUCOSE, CAPILLARY
Glucose-Capillary: 63 mg/dL — ABNORMAL LOW (ref 70–99)
Glucose-Capillary: 61 mg/dL — ABNORMAL LOW (ref 70–99)
Glucose-Capillary: 116 mg/dL — ABNORMAL HIGH (ref 70–99)
Glucose-Capillary: 182 mg/dL — ABNORMAL HIGH (ref 70–99)
Glucose-Capillary: 96 mg/dL (ref 70–99)
Glucose-Capillary: 79 mg/dL (ref 70–99)

## 2020-05-04 LAB — CBC
HCT: 32.7 % — ABNORMAL LOW (ref 36.0–46.0)
Hemoglobin: 10.1 g/dL — ABNORMAL LOW (ref 12.0–15.0)
MCH: 32.5 pg (ref 26.0–34.0)
MCHC: 30.9 g/dL (ref 30.0–36.0)
MCV: 105.1 fL — ABNORMAL HIGH (ref 80.0–100.0)
Platelets: 199 10*3/uL (ref 150–400)
RBC: 3.11 MIL/uL — ABNORMAL LOW (ref 3.87–5.11)
RDW: 19.1 % — ABNORMAL HIGH (ref 11.5–15.5)
WBC: 9.3 10*3/uL (ref 4.0–10.5)
nRBC: 0.3 % — ABNORMAL HIGH (ref 0.0–0.2)

## 2020-05-04 LAB — OCCULT BLOOD X 1 CARD TO LAB, STOOL: Fecal Occult Bld: POSITIVE — AB

## 2020-05-04 NOTE — Progress Notes (Addendum)
Dr Allena Katz on department and informed of bloody stools and vascular changes to BLE more notable to Right ankle area/toe/leg and large discoloration area to left FA, and CBG results , Verbal orders to hold Eliqus and ASA this morning , on coming Nurse informed

## 2020-05-04 NOTE — Progress Notes (Addendum)
Occupational Therapy Session Note  Patient Details  Name: Madison Hurst MRN: [id number] Date of Birth: 10-20-1960  Today's Date: 05/04/2020 OT Individual Time: 1430-1530 OT Individual Time Calculation (min): 60 min    Short Term Goals: Week 1:  OT Short Term Goal 1 (Week 1): Pt will complete UB dressing with min assist to donn pullover shirt. OT Short Term Goal 1 - Progress (Week 1): Not met OT Short Term Goal 2 (Week 1): Pt will complete toilet transfer with min assist stand pivot. OT Short Term Goal 2 - Progress (Week 1): Met OT Short Term Goal 3 (Week 1): Pt will complete LB dressing with min assist for donning brief and pants only sit to stand. OT Short Term Goal 3 - Progress (Week 1): Not met Week 2:  OT Short Term Goal 1 (Week 2): Pt will complete LB dressing with min assist for donning brief and pants only sit to stand. OT Short Term Goal 2 (Week 2): Pt will complete UB dressing with min assist to donn pullover shirt. OT Short Term Goal 3 (Week 2): Pt will complete toileting tasks with min assist sit to stand.  Skilled Therapeutic Interventions/Progress Updates:    Pt laying supine sideways in bed, reports she tried to get up to go to the bathroom without assist and had loss of balance.  Pt required max assist to return to seated EOB position safely.  Pt ambulated to bathroom using RW with min assist and increased time.  Toileting completed with mod assist and toilet transfer with min assist.  Pt ambulated to sink and washed hands with CGA.  Stand to sit at w/c with min assist. Educated pt on compensatory and joint protection strategies to increase independence and ease during self care including use of loose/stretchy pants/underwear during LB dressing, supportive footwear use, and utilization of heat modalities (and contraindications).  OT provided coban wrap to build up comb and toothbrush handles.  Pt completed hair and oral care using built up handles and reports  decreased pain in hand.    OT noted wrist extension P/AROM very limited (able to only achieve approximately 0 degrees of extension bilaterally), as well as tight intrinsics of index through small of both hands, and mild to moderate ulnar drift bilaterally.  Also noted mild MCP joint subluxation at rest Index through small bilateral hands.  OT applied MHP to right wrist and hand during education of compensatory techniques to increase soft tissue pliability and reduce pain.  Skin intact prior to and after application.  Pt performed gentle AAROM wrist extension and lumbrical stretch Index-small to pts tolerance.  Pt requesting back to bed at end of session.  Stand pivot w/c to EOB and sit to supine with min assist.  Pt could benefit from bilateral custom resting hand splints with finger separators to prevent joint contractures, reduce pain, and facilitate overall functional use of bilateral hands.Call bell in reach, bed alarm on.    Therapy Documentation Precautions:  Precautions Precautions: Fall,Other (comment) Precaution Comments: EF 20-25%, L hemiparesis, RA (hands, shoulders, and ankles) Restrictions Weight Bearing Restrictions: No   Therapy/Group: Individual Therapy  Madison Hurst 05/04/2020, 4:39 PM

## 2020-05-04 NOTE — Progress Notes (Signed)
Hypoglycemic Event  CBG: 63  Treatment: po liquids and food Symptoms: none Follow-up CBG: Time:0636  CBG Result 61:  Possible Reasons for Event unknown  Comments/MD notified: Dr. Vianne Bulls

## 2020-05-04 NOTE — Progress Notes (Signed)
[facility] PHYSICAL MEDICINE & REHABILITATION PROGRESS NOTE   Subjective/Complaints: Patient seen laying in bed this morning.  She states she slept well overnight.  Per nursing, noted to have bloody stools.  She complains of itching from her socks and enlarging ecchymosis.  Review of systems: Denies CP, SOB, N/V/D  Objective:   No results found. Recent Labs    05/03/20 0540 05/04/20 0630  WBC 7.6 9.3  HGB 10.3* 10.1*  HCT 31.9* 32.7*  PLT 189 199   No results for input(s): NA, K, CL, CO2, GLUCOSE, BUN, CREATININE, CALCIUM in the last 72 hours.  Intake/Output Summary (Last 24 hours) at 05/04/2020 1325 Last data filed at 05/04/2020 0714 Gross per 24 hour  Intake 280 ml  Output --  Net 280 ml        Physical Exam: Vital Signs Blood pressure (!) 92/59, pulse 90, temperature 98.2 F (36.8 C), resp. rate 20, height 5' (1.524 m), weight 56.6 kg, SpO2 100 %. Constitutional: No distress . Vital signs reviewed. HENT: Normocephalic.  Atraumatic. Eyes: EOMI. No discharge. Cardiovascular: No JVD.  RRR. Respiratory: Normal effort.  No stridor.  Bilateral clear to auscultation. GI: Non-distended.  BS +. Skin: Warm and dry.   Contact dermatitis at ankles. Scattered ecchymosis  Psych: Normal mood.  Normal behavior. Musc: No edema in extremities.  No tenderness in extremities. Neuro: Alert Motor: 4/5 throughout (?left slightly weaker)  Assessment/Plan: 1. Functional deficits which require 3+ hours per day of interdisciplinary therapy in a comprehensive inpatient rehab setting.  Physiatrist is providing close team supervision and 24 hour management of active medical problems listed below.  Physiatrist and rehab team continue to assess barriers to discharge/monitor patient progress toward functional and medical goals  Care Tool:  Bathing    Body parts bathed by patient: Right arm,Left arm,Chest,Abdomen,Front perineal area,Right upper leg,Left upper leg,Face,Right lower  leg,Left lower leg   Body parts bathed by helper: Buttocks     Bathing assist Assist Level: Minimal Assistance - Patient > 75%     Upper Body Dressing/Undressing Upper body dressing   What is the patient wearing?: Pull over shirt    Upper body assist Assist Level: Moderate Assistance - Patient 50 - 74%    Lower Body Dressing/Undressing Lower body dressing      What is the patient wearing?: Pants,Incontinence brief     Lower body assist Assist for lower body dressing: Maximal Assistance - Patient 25 - 49%     Toileting Toileting    Toileting assist Assist for toileting: Moderate Assistance - Patient 50 - 74%     Transfers Chair/bed transfer  Transfers assist     Chair/bed transfer assist level: Minimal Assistance - Patient > 75% Chair/bed transfer assistive device: Geologist, engineering   Ambulation assist      Assist level: Minimal Assistance - Patient > 75% Assistive device: Walker-rolling Max distance: 72ft   Walk 10 feet activity   Assist     Assist level: Minimal Assistance - Patient > 75% Assistive device: Walker-rolling   Walk 50 feet activity   Assist Walk 50 feet with 2 turns activity did not occur: Safety/medical concerns         Walk 150 feet activity   Assist Walk 150 feet activity did not occur: N/A (pt does not ambulate this distance at basline)         Walk 10 feet on uneven surface  activity   Assist Walk 10 feet on uneven surfaces activity did  not occur: Safety/medical concerns         Wheelchair     Assist Will patient use wheelchair at discharge?: No             Wheelchair 50 feet with 2 turns activity    Assist            Wheelchair 150 feet activity     Assist          Blood pressure (!) 92/59, pulse 90, temperature 98.2 F (36.8 C), resp. rate 20, height 5' (1.524 m), weight 56.6 kg, SpO2 100 %.   Medical Problem List and Plan: 1.  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  Antithrombotics: -DVT/anticoagulation: Eliquis 5 mg twice daily, d/ced due to GI bleed and increasing hematoma, Neuro aware.    Lower extremity Dopplers negative             -antiplatelet therapy: Aspirin 81 mg daily 3. Pain Management: Continue Hydrocodone as needed             Controlled on 2/5  Monitor with increased exertion 4. Mood: Cymbalta 60 mg daily, Wellbutrin 150 mg daily, Ativan 0.5 mg every 8 hours as needed             -antipsychotic agents: N/A 5. Neuropsych: This patient is capable of making decisions on her own behalf. 6. Skin/Wound Care: Routine skin checks 7. Fluids/Electrolytes/Nutrition: Routine in and outs.   8.  History of PAF/CAD/PFO.  30-day cardiac event monitor per cardiology services Dr.Kardie Tobb.  Continue Eliquis for now.  If EF improves and no atrial fibrillation in 3 months plan to stop Eliquis and restart Plavix.             Monitor with increased activity 9.  Transaminitis.  Follow-up gastroenterology service Dr. Marina Goodell.               LFTs elevated on 1/31, labs ordered for Monday 10.  Hyperlipidemia.  Crestor on hold due to elevated LFTs. 11.  Rheumatoid arthritis: Low-dose prednisone as well as weekly Orencia (was on Ansid at home flurbiprofen)             Having rheumatoid flare  20mg  prednisone x 2d,  reduce to 10mg  on 2/3 and 5mg  on 2/49may have caused gastritis 12.  Diabetes mellitus.  Hemoglobin A1c 6.1.  SSI.  Patient on Metformin 500 mg daily prior to admission.  Resume as needed          CBG (last 3)  Recent Labs    05/04/20 0636 05/04/20 0815 05/04/20 1154  GLUCAP 61* 116* 182*   Labile on 2/5, exacerbated by steroids 13.  Hypothyroidism: Synthroid 14.  Essential hypertension.. Blood pressure had remained a bit soft Toprol-XL resumed at 12.5 mg daily 04/26/2020   Vitals:   05/04/20 0836 05/04/20 0935  BP: (!) 97/56 (!) 92/59  Pulse: (!) 101 90   Resp:    Temp:    SpO2:     Soft, but stable on 2/5, HR borderline elevated, will not make changes at this time 15.  Constipation but multiple stools post sorbitol 1/29 16. Sleep disturbance: melatonin 3mg  started HS  Improving 17.  GI bleed with drop in Hgb, suspect lower GI bleed given bright red blood  Hb 10.1 on 2/5, cont to monitor  LOS: 8 days A FACE TO FACE EVALUATION WAS PERFORMED  Naven Giambalvo 07/02/20 05/04/2020, 1:25 PM

## 2020-05-04 NOTE — Progress Notes (Signed)
Patient noted with scattered areas of red/browish spots to right lower lateral area of right leg, verbalize discomfort of itching and irritation. Also 2 small dark reddish stool mixed with urine, adl care provided, monitor, Large bruising to right upper arm with discomfort verbalized by patient

## 2020-05-04 NOTE — Progress Notes (Signed)
Hemoccult  occult specimen sent to lab per order

## 2020-05-04 NOTE — Progress Notes (Signed)
This patient is noted to be having more frequent small to medium lose Liquid stool ( red/brownish), incontinent care provided, and reassurance. Shift RNFreddrick March), made aware

## 2020-05-04 NOTE — Progress Notes (Signed)
Physical Therapy Session Note  Patient Details  Name: Madison Hurst MRN: [id number] Date of Birth: [birth year]  Today's Date: 05/04/2020 PT Individual Time: 1100-1154 PT Individual Time Calculation (min): 54 min   Short Term Goals: Week 2:  PT Short Term Goal 1 (Week 2): Pt will perform supine<>sit with CGA PT Short Term Goal 2 (Week 2): Pt will consistently perform sit<>stands using LRAD with min assist PT Short Term Goal 3 (Week 2): Pt will consistently perform bed<>chair transfers using LRAD with min assist PT Short Term Goal 4 (Week 2): Pt will ambulate at least 42f using LRAD with min assist PT Short Term Goal 5 (Week 2): Pt will ascend/descend 2 (6" height) steps with min assist  Skilled Therapeutic Interventions/Progress Updates:   Pt received supine in bed and agreeable to PT. PT attempted to don Bil teds. noted discoloration Bil toes with diffuse mild discoloration and small scab-like wounds in bil calves, R>L. Question initial stage of venous insuffiencey ulcer. Unable to locate MD at present time; did not don Teds at this time, as pt reports wound apearing after placement of Teds. PT performed orthostatic VS. Supine: 105/64, HR 91. Supine>sit transfer with min assist at trunk. Seated BP 93/64, HR 90. Sit>stand from EOB with RW and supervision assist, no s/s of orthostasis. Unable to obtain BP in standing on 2 attempts. [facility]   Pt transported rehab gym. Sit<>stand from various surfaces with CGA and cues for UE to push from chair, then UE support on RW. Standing tolerance x 45 sec, but Dinmap did not obtain measurement. Gait training with RW x 3032fnd min assist from PT to improve pelvic rotation.   Stand pivot [facility].  Standing NMR with BUE support in parallel bars mini-squat x 10, hip abduction x 8, reciprocal march x 10, forward/reverse gait x 8 ft each direction. Min assist throughout to improve  hip extension in stance and limit lateral lean to the R intermittently.   Patient returned to room and [facility] in reach and all needs met.             Therapy Documentation Precautions:  Precautions Precautions: Fall,Other (comment) Precaution Comments: EF 20-25%, L hemiparesis, RA (hands, shoulders, and ankles) Restrictions Weight Bearing Restrictions: No Vital Signs: Therapy Vitals Pulse Rate: 90 BP: (!) 92/59 Pain: Pain Assessment Pain Scale: Faces Pain Score: 8  Faces Pain Scale: No hurt Pain Location: Back Pain Orientation: Lower Pain Descriptors / Indicators: Aching Pain Frequency: Intermittent Pain Onset: On-going Patients Stated Pain Goal: 0 Pain Intervention(s): Medication (See eMAR)    Therapy/Group: Individual Therapy  AusLorie Phenix5/2022, 11:56 AM

## 2020-05-04 NOTE — Progress Notes (Signed)
Speech Language Pathology Daily Session Note  Patient Details  Name: Suhani Stillion MRN: [id number] Date of Birth: July 28, 1960  Today's Date: 05/04/2020 SLP Individual Time: 1030-1100 SLP Individual Time Calculation (min): 30 min  Short Term Goals: Week 1: SLP Short Term Goal 1 (Week 1): Pt will utilize external memory aids to facilitate recall of daily information with min assist verbal cues SLP Short Term Goal 2 (Week 1): Pt will complete mildly complex functional tasks wtih min assist verbal cues. SLP Short Term Goal 3 (Week 1): Pt will recognize and correct errors in the moment when completing mildly complex tasks with min assist verbal cues. SLP Short Term Goal 4 (Week 1): Pt will selectively attend to functional tasks in a mildly distracting environment for 10 minutes with supervision verbal cues for redirection.  Skilled Therapeutic Interventions: Skilled SLP intervention focused on cognition. Deductive reasoning puzzle completed with max A with verbal and visual cues to process written information. Pt increased accuracy and completion when clues already completed were crossed out by SLP. Max A required for error awareness and to redirect to clues she had not completed on puzzle. Cont with therapy per plan of care.      Pain Pain Assessment Pain Scale: Faces Pain Score: 8  Faces Pain Scale: No hurt Pain Location: Back Pain Orientation: Lower Pain Descriptors / Indicators: Aching Pain Frequency: Intermittent Pain Onset: On-going Patients Stated Pain Goal: 0 Pain Intervention(s): Medication (See eMAR)  Therapy/Group: Individual Therapy  Carlean Jews Lee-Ann Gal 05/04/2020, 10:57 AM

## 2020-05-05 DIAGNOSIS — E119 Type 2 diabetes mellitus without complications: Secondary | ICD-10-CM

## 2020-05-05 DIAGNOSIS — E162 Hypoglycemia, unspecified: Secondary | ICD-10-CM

## 2020-05-05 LAB — GLUCOSE, CAPILLARY
Glucose-Capillary: 56 mg/dL — ABNORMAL LOW (ref 70–99)
Glucose-Capillary: 37 mg/dL — CL (ref 70–99)
Glucose-Capillary: 70 mg/dL (ref 70–99)
Glucose-Capillary: 190 mg/dL — ABNORMAL HIGH (ref 70–99)
Glucose-Capillary: 196 mg/dL — ABNORMAL HIGH (ref 70–99)
Glucose-Capillary: 121 mg/dL — ABNORMAL HIGH (ref 70–99)

## 2020-05-05 LAB — CBC
HCT: 27.6 % — ABNORMAL LOW (ref 36.0–46.0)
Hemoglobin: 9.1 g/dL — ABNORMAL LOW (ref 12.0–15.0)
MCH: 33.8 pg (ref 26.0–34.0)
MCHC: 33 g/dL (ref 30.0–36.0)
MCV: 102.6 fL — ABNORMAL HIGH (ref 80.0–100.0)
Platelets: 200 10*3/uL (ref 150–400)
RBC: 2.69 MIL/uL — ABNORMAL LOW (ref 3.87–5.11)
RDW: 19.4 % — ABNORMAL HIGH (ref 11.5–15.5)
WBC: 9.1 10*3/uL (ref 4.0–10.5)
nRBC: 0.4 % — ABNORMAL HIGH (ref 0.0–0.2)

## 2020-05-05 MED ORDER — METFORMIN HCL 500 MG PO TABS
500.0000 mg | ORAL_TABLET | Freq: Every day | ORAL | Status: DC
  Administered 2020-05-06 – 2020-05-07 (×2): 500 mg via ORAL
  Filled 2020-05-05 (×2): qty 1

## 2020-05-05 NOTE — Progress Notes (Signed)
Patient's blood sugar this am was 56 at 0613. Patient ate a graham cracker with peanut butter and F/S was 37 at 0640. Patient ate 3/4's of a pancake and one bite of sausage and F/S was 70 at 0702. Patient attempting to finish pancake. Dr. Allena Katz notified. Patient had graham crackers and peanut butter for an HS snack last night. Patient was asymptomatic.

## 2020-05-05 NOTE — Progress Notes (Signed)
Occupational Therapy Session Note  Patient Details  Name: Madison Hurst MRN: [id number] Date of Birth: 12/04/1960  Today's Date: 05/05/2020 OT Individual Time: 2035-5974 OT Individual Time Calculation (min): 27 min   Short Term Goals: Week 1:  OT Short Term Goal 1 (Week 1): Pt will complete UB dressing with min assist to donn pullover shirt. OT Short Term Goal 1 - Progress (Week 1): Not met OT Short Term Goal 2 (Week 1): Pt will complete toilet transfer with min assist stand pivot. OT Short Term Goal 2 - Progress (Week 1): Met OT Short Term Goal 3 (Week 1): Pt will complete LB dressing with min assist for donning brief and pants only sit to stand. OT Short Term Goal 3 - Progress (Week 1): Not met Week 2:  OT Short Term Goal 1 (Week 2): Pt will complete LB dressing with min assist for donning brief and pants only sit to stand. OT Short Term Goal 2 (Week 2): Pt will complete UB dressing with min assist to donn pullover shirt. OT Short Term Goal 3 (Week 2): Pt will complete toileting tasks with min assist sit to stand.  Skilled Therapeutic Interventions/Progress Updates:    Pt greeted in bed, requesting to start session by brushing her teeth at the sink. Supine<sit from flat bed without bedrail, per home setup, completed with Mod A. Discussed with pt option of independently purchasing a bedrail to fit under her mattress to increase ease of bed mobility at home. Min A for donning the Rt gripper sock while sitting EOB and Max A for the Lt sock due to increase in her resting back pain when attempting herself. Min A for stand pivot<w/c using RW. Focus was then placed on Lt NMR via engagement in several bimanual self care tasks including oral care, hair brushing, and hand washing. Pt used built up toothbrush and built up comb to meet demands of tasks to manage RA while increasing success of functional Lt hand use. OT placed grooming items in front of the faucet lever on the Lt side so  that she could use the affected UE to reach with success. Min A for stand pivot<bed using device once again where pt transitioned to supine. She remained in care of RN at close of session for receiving her pain medicine. Call bell within reach and bed alarm set.  Therapy Documentation Precautions:  Precautions Precautions: Fall,Other (comment) Precaution Comments: EF 20-25%, L hemiparesis, RA (hands, shoulders, and ankles) Restrictions Weight Bearing Restrictions: No Vital Signs: Therapy Vitals Temp: 98.6 F (37 C) Temp Source: Oral Pulse Rate: 98 Resp: 17 BP: 95/64 Patient Position (if appropriate): Lying Oxygen Therapy SpO2: 100 % O2 Device: Room Air Pain: Pain Assessment Pain Scale: 0-10 Pain Score: 0-No pain Pain Type: Chronic pain Pain Location: Back Pain Orientation: Lower Pain Descriptors / Indicators: Aching Pain Frequency: Intermittent Pain Onset: On-going Patients Stated Pain Goal: 0 Pain Intervention(s): Medication (See eMAR) ADL: ADL Eating: Set up (per pt she needs assist with cutting up food) Where Assessed-Eating: Wheelchair Grooming: Minimal assistance Where Assessed-Grooming: Sitting at sink Upper Body Bathing: Minimal assistance Where Assessed-Upper Body Bathing: Wheelchair,Sitting at sink Lower Body Bathing: Moderate assistance Where Assessed-Lower Body Bathing: Wheelchair,Standing at sink,Sitting at sink Upper Body Dressing: Maximal assistance Where Assessed-Upper Body Dressing: Standing at sink,Sitting at sink Lower Body Dressing: Maximal assistance Where Assessed-Lower Body Dressing: Wheelchair,Standing at sink,Sitting at sink Toileting: Moderate assistance Where Assessed-Toileting: Bedside Commode Toilet Transfer: Moderate assistance Toilet Transfer Method: Stand pivot Armed forces technical officer  Equipment: Radiographer, therapeutic: Not assessed Social research officer, government: Not assessed      Therapy/Group: Individual Therapy  Rabecka Brendel A  Elliona Doddridge 05/05/2020, 3:58 PM

## 2020-05-05 NOTE — Progress Notes (Signed)
[place] PHYSICAL MEDICINE & REHABILITATION PROGRESS NOTE   Subjective/Complaints: Patient seen laying in bed this morning.  She states she slept well overnight.  She denies complaints.  Discussed anticoagulation with patient.  Per nursing, noted to have hypoglycemia overnight.  She notes she had bloody stools yesterday as well.  Review of systems: Denies CP, SOB, N/V/D  Objective:   No results found. Recent Labs    05/04/20 0630 05/05/20 0619  WBC 9.3 9.1  HGB 10.1* 9.1*  HCT 32.7* 27.6*  PLT 199 200   No results for input(s): NA, K, CL, CO2, GLUCOSE, BUN, CREATININE, CALCIUM in the last 72 hours.  Intake/Output Summary (Last 24 hours) at 05/05/2020 6045 Last data filed at 05/05/2020 0715 Gross per 24 hour  Intake 678 ml  Output --  Net 678 ml        Physical Exam: Vital Signs Blood pressure 120/79, pulse 97, temperature 97.8 F (36.6 C), temperature source Oral, resp. rate 16, height 5' (1.524 m), weight 56.6 kg, SpO2 90 %. Constitutional: No distress . Vital signs reviewed. HENT: Normocephalic.  Atraumatic. Eyes: EOMI. No discharge. Cardiovascular: No JVD.  RRR. Respiratory: Normal effort.  No stridor.  Bilateral clear to auscultation. GI: Non-distended.  BS +. Skin: Warm and dry.   Contact dermatitis at ankles, improving Scattered ecchymosis Psych: Normal mood.  Normal behavior. Musc: No edema in extremities.  No tenderness in extremities. Neuro: Alert Motor: 4/5 throughout (?left slightly weaker), unchanged  Assessment/Plan: 1. Functional deficits which require 3+ hours per day of interdisciplinary therapy in a comprehensive inpatient rehab setting.  Physiatrist is providing close team supervision and 24 hour management of active medical problems listed below.  Physiatrist and rehab team continue to assess barriers to discharge/monitor patient progress toward functional and medical goals  Care Tool:  Bathing    Body parts bathed by patient: Right  arm,Left arm,Chest,Abdomen,Front perineal area,Right upper leg,Left upper leg,Face,Right lower leg,Left lower leg   Body parts bathed by helper: Buttocks     Bathing assist Assist Level: Minimal Assistance - Patient > 75%     Upper Body Dressing/Undressing Upper body dressing   What is the patient wearing?: Pull over shirt    Upper body assist Assist Level: Moderate Assistance - Patient 50 - 74%    Lower Body Dressing/Undressing Lower body dressing      What is the patient wearing?: Pants,Incontinence brief     Lower body assist Assist for lower body dressing: Maximal Assistance - Patient 25 - 49%     Toileting Toileting    Toileting assist Assist for toileting: Moderate Assistance - Patient 50 - 74%     Transfers Chair/bed transfer  Transfers assist     Chair/bed transfer assist level: Minimal Assistance - Patient > 75% Chair/bed transfer assistive device: Geologist, engineering   Ambulation assist      Assist level: Minimal Assistance - Patient > 75% Assistive device: Walker-rolling Max distance: 72ft   Walk 10 feet activity   Assist     Assist level: Minimal Assistance - Patient > 75% Assistive device: Walker-rolling   Walk 50 feet activity   Assist Walk 50 feet with 2 turns activity did not occur: Safety/medical concerns         Walk 150 feet activity   Assist Walk 150 feet activity did not occur: N/A (pt does not ambulate this distance at basline)         Walk 10 feet on uneven surface  activity  Assist Walk 10 feet on uneven surfaces activity did not occur: Safety/medical concerns         Wheelchair     Assist Will patient use wheelchair at discharge?: No             Wheelchair 50 feet with 2 turns activity    Assist            Wheelchair 150 feet activity     Assist          Blood pressure 120/79, pulse 97, temperature 97.8 F (36.6 C), temperature source Oral, resp. rate 16,  height 5' (1.524 m), weight 56.6 kg, SpO2 90 %.   Medical Problem List and Plan: 1.  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  Antithrombotics: -DVT/anticoagulation: Eliquis 5 mg twice daily, d/ced due to GI bleed and increasing hematoma, Neuro aware.    Lower extremity Dopplers negative             -antiplatelet therapy: Aspirin 81 mg daily 3. Pain Management: Continue Hydrocodone as needed             Controlled on 2/6  Monitor with increased exertion 4. Mood: Cymbalta 60 mg daily, Wellbutrin 150 mg daily, Ativan 0.5 mg every 8 hours as needed             -antipsychotic agents: N/A 5. Neuropsych: This patient is capable of making decisions on her own behalf. 6. Skin/Wound Care: Routine skin checks 7. Fluids/Electrolytes/Nutrition: Routine in and outs.   8.  History of PAF/CAD/PFO.  30-day cardiac event monitor per cardiology services Dr.Kardie Tobb.  Continue Eliquis for now.  If EF improves and no atrial fibrillation in 3 months plan to stop Eliquis and restart Plavix.             Monitor with increased activity 9.  Transaminitis.  Follow-up gastroenterology service Dr. Marina Goodell.               LFTs elevated on 1/31, labs ordered for tomorrow 10.  Hyperlipidemia.  Crestor on hold due to elevated LFTs. 11.  Rheumatoid arthritis: Low-dose prednisone as well as weekly Orencia (was on Ansid at home flurbiprofen)             Having rheumatoid flare  20mg  prednisone x 2d,  reduce to 10mg  on 2/3 and 5mg  on 2/18may have caused gastritis 12.  Diabetes mellitus.  Hemoglobin A1c 6.1.  Patient on Metformin 500 mg daily prior to admission.  Resume as needed          CBG (last 3)  Recent Labs    05/05/20 0613 05/05/20 0640 05/05/20 0702  GLUCAP 56* 37* 70   SSI DC'd due to hypoglycemia  Metformin XL changed to Metformin 500 daily  Labile on 2/5, exacerbated by steroids 13.  Hypothyroidism: Synthroid 14.   Essential hypertension.. Blood pressure had remained a bit soft Toprol-XL resumed at 12.5 mg daily 04/26/2020   Vitals:   05/04/20 1944 05/05/20 0340  BP: 91/79 120/79  Pulse: 96 97  Resp: 20 16  Temp: 98.2 F (36.8 C) 97.8 F (36.6 C)  SpO2: 100% 90%   Soft, but stable on 2/6, HR borderline elevated, will not make changes at this time 15.  Constipation but multiple stools post sorbitol 1/29 16. Sleep disturbance: melatonin 3mg  started HS  Improving 17.  GI bleed with drop in Hgb, suspect lower GI bleed given bright red blood  Hb 9.1 on 2/6, labs ordered for tomorrow   Will monitor closely tomorrow now that off Eliquis  LOS: 9 days A FACE TO FACE EVALUATION WAS PERFORMED  Madison Hurst 05/05/2020, 8:22 AM

## 2020-05-05 NOTE — Progress Notes (Addendum)
Pt noted to have bright bloody stools throughout shift, pt also noted to have brusing to arm and feet, cap refill <3secs . Pt alert and orientedx3 denies any s/sx or concerns at this time. MD aware of pt status, no new orders at this time.

## 2020-05-05 NOTE — Plan of Care (Signed)
  Problem: Consults Goal: RH STROKE PATIENT EDUCATION Description: See Patient Education module for education specifics  Outcome: Progressing Goal: Nutrition Consult-if indicated Outcome: Progressing Goal: Diabetes Guidelines if Diabetic/Glucose > 140 Description: If diabetic or lab glucose is > 140 mg/dl - Initiate Diabetes/Hyperglycemia Guidelines & Document Interventions  Outcome: Progressing   Problem: RH BOWEL ELIMINATION Goal: RH STG MANAGE BOWEL WITH ASSISTANCE Description: STG Manage Bowel with supervision. Outcome: Progressing Goal: RH STG MANAGE BOWEL W/MEDICATION W/ASSISTANCE Description: STG Manage Bowel with Medication with mod I Outcome: Progressing   Problem: RH BLADDER ELIMINATION Goal: RH STG MANAGE BLADDER WITH ASSISTANCE Description: STG Manage Bladder With min Assistance Outcome: Progressing   Problem: RH SKIN INTEGRITY Goal: RH STG SKIN FREE OF INFECTION/BREAKDOWN Description: Min assist Outcome: Progressing Goal: RH STG MAINTAIN SKIN INTEGRITY WITH ASSISTANCE Description: STG Maintain Skin Integrity With min Assistance. Outcome: Progressing   Problem: RH SAFETY Goal: RH STG ADHERE TO SAFETY PRECAUTIONS W/ASSISTANCE/DEVICE Description: STG Adhere to Safety Precautions With min Assistance/Device. Outcome: Progressing Goal: RH STG DECREASED RISK OF FALL WITH ASSISTANCE Description: STG Decreased Risk of Fall With min Assistance. Outcome: Progressing   Problem: RH COGNITION-NURSING Goal: RH STG USES MEMORY AIDS/STRATEGIES W/ASSIST TO PROBLEM SOLVE Description: STG Uses Memory Aids/Strategies With supervision Assistance to Problem Solve. Outcome: Progressing Goal: RH STG ANTICIPATES NEEDS/CALLS FOR ASSIST W/ASSIST/CUES Description: STG Anticipates Needs/Calls for Assist With Assistance/Cues. Outcome: Progressing   Problem: RH PAIN MANAGEMENT Goal: RH STG PAIN MANAGED AT OR BELOW PT'S PAIN GOAL Description: Pain less than or equal to 2. Outcome:  Progressing   Problem: RH KNOWLEDGE DEFICIT Goal: RH STG INCREASE KNOWLEDGE OF DIABETES Description: Patient and spouse will be able to manage DM with medications and dietary modifications using handouts and educational materials independently Outcome: Progressing Goal: RH STG INCREASE KNOWLEDGE OF HYPERTENSION Description: Patient and spouse will be able to manage HTN with medications and dietary modifications using handouts and educational materials independently Outcome: Progressing Goal: RH STG INCREASE KNOWLEGDE OF HYPERLIPIDEMIA Description: Patient and spouse will be able to manage HLD with medications and dietary modifications using handouts and educational materials independently Outcome: Progressing Goal: RH STG INCREASE KNOWLEDGE OF STROKE PROPHYLAXIS Outcome: Progressing   Problem: RH Pre-functional/Other (Specify) Goal: RH LTG Pre-functional (Specify) Outcome: Progressing Goal: RH LTG Interdisciplinary (Specify) 1 Description: RH LTG Interdisciplinary (Specify)1 Outcome: Progressing Goal: RH LTG Interdisciplinary (Specify) 2 Description: RH LTG Interdisciplinary (Specify) 2  Outcome: Progressing

## 2020-05-06 LAB — CBC
HCT: 31.3 % — ABNORMAL LOW (ref 36.0–46.0)
Hemoglobin: 9.8 g/dL — ABNORMAL LOW (ref 12.0–15.0)
MCH: 32.8 pg (ref 26.0–34.0)
MCHC: 31.3 g/dL (ref 30.0–36.0)
MCV: 104.7 fL — ABNORMAL HIGH (ref 80.0–100.0)
Platelets: 233 10*3/uL (ref 150–400)
RBC: 2.99 MIL/uL — ABNORMAL LOW (ref 3.87–5.11)
RDW: 19.6 % — ABNORMAL HIGH (ref 11.5–15.5)
WBC: 10.2 10*3/uL (ref 4.0–10.5)
nRBC: 0.9 % — ABNORMAL HIGH (ref 0.0–0.2)

## 2020-05-06 LAB — HEPATIC FUNCTION PANEL
ALT: 127 U/L — ABNORMAL HIGH (ref 0–44)
AST: 51 U/L — ABNORMAL HIGH (ref 15–41)
Albumin: 2.7 g/dL — ABNORMAL LOW (ref 3.5–5.0)
Alkaline Phosphatase: 161 U/L — ABNORMAL HIGH (ref 38–126)
Bilirubin, Direct: 0.4 mg/dL — ABNORMAL HIGH (ref 0.0–0.2)
Indirect Bilirubin: 0.6 mg/dL (ref 0.3–0.9)
Total Bilirubin: 1 mg/dL (ref 0.3–1.2)
Total Protein: 5.3 g/dL — ABNORMAL LOW (ref 6.5–8.1)

## 2020-05-06 LAB — GLUCOSE, CAPILLARY
Glucose-Capillary: 83 mg/dL (ref 70–99)
Glucose-Capillary: 150 mg/dL — ABNORMAL HIGH (ref 70–99)
Glucose-Capillary: 159 mg/dL — ABNORMAL HIGH (ref 70–99)
Glucose-Capillary: 118 mg/dL — ABNORMAL HIGH (ref 70–99)

## 2020-05-06 MED ORDER — DICLOFENAC SODIUM 1 % EX GEL
2.0000 g | Freq: Four times a day (QID) | CUTANEOUS | Status: DC
  Administered 2020-05-06 – 2020-05-16 (×33): 2 g via TOPICAL
  Filled 2020-05-06: qty 100

## 2020-05-06 MED ORDER — METFORMIN HCL 500 MG PO TABS
250.0000 mg | ORAL_TABLET | Freq: Every day | ORAL | Status: DC
  Administered 2020-05-06: 250 mg via ORAL
  Filled 2020-05-06: qty 1

## 2020-05-06 NOTE — Plan of Care (Signed)
Problem: Consults Goal: RH STROKE PATIENT EDUCATION Description: See Patient Education module for education specifics  Outcome: Progressing Goal: Nutrition Consult-if indicated Outcome: Progressing Goal: Diabetes Guidelines if Diabetic/Glucose > 140 Description: If diabetic or lab glucose is > 140 mg/dl - Initiate Diabetes/Hyperglycemia Guidelines & Document Interventions  Outcome: Progressing   Problem: RH BOWEL ELIMINATION Goal: RH STG MANAGE BOWEL WITH ASSISTANCE Description: STG Manage Bowel with supervision. Outcome: Progressing Goal: RH STG MANAGE BOWEL W/MEDICATION W/ASSISTANCE Description: STG Manage Bowel with Medication with mod I Outcome: Progressing   Problem: RH BLADDER ELIMINATION Goal: RH STG MANAGE BLADDER WITH ASSISTANCE Description: STG Manage Bladder With min Assistance Outcome: Progressing   Problem: RH SKIN INTEGRITY Goal: RH STG SKIN FREE OF INFECTION/BREAKDOWN Description: Min assist Outcome: Progressing Goal: RH STG MAINTAIN SKIN INTEGRITY WITH ASSISTANCE Description: STG Maintain Skin Integrity With min Assistance. Outcome: Progressing   Problem: RH SAFETY Goal: RH STG ADHERE TO SAFETY PRECAUTIONS W/ASSISTANCE/DEVICE Description: STG Adhere to Safety Precautions With min Assistance/Device. Outcome: Progressing Goal: RH STG DECREASED RISK OF FALL WITH ASSISTANCE Description: STG Decreased Risk of Fall With min Assistance. Outcome: Progressing   Problem: RH COGNITION-NURSING Goal: RH STG USES MEMORY AIDS/STRATEGIES W/ASSIST TO PROBLEM SOLVE Description: STG Uses Memory Aids/Strategies With supervision Assistance to Problem Solve. Outcome: Progressing Goal: RH STG ANTICIPATES NEEDS/CALLS FOR ASSIST W/ASSIST/CUES Description: STG Anticipates Needs/Calls for Assist With Assistance/Cues. Outcome: Progressing   Problem: RH PAIN MANAGEMENT Goal: RH STG PAIN MANAGED AT OR BELOW PT'S PAIN GOAL Description: Pain less than or equal to 2. Outcome:  Progressing   Problem: RH KNOWLEDGE DEFICIT Goal: RH STG INCREASE KNOWLEDGE OF DIABETES Description: Patient and spouse will be able to manage DM with medications and dietary modifications using handouts and educational materials independently Outcome: Progressing Goal: RH STG INCREASE KNOWLEDGE OF HYPERTENSION Description: Patient and spouse will be able to manage HTN with medications and dietary modifications using handouts and educational materials independently Outcome: Progressing Goal: RH STG INCREASE KNOWLEGDE OF HYPERLIPIDEMIA Description: Patient and spouse will be able to manage HLD with medications and dietary modifications using handouts and educational materials independently Outcome: Progressing Goal: RH STG INCREASE KNOWLEDGE OF STROKE PROPHYLAXIS Outcome: Progressing   Problem: RH Pre-functional/Other (Specify) Goal: RH LTG Pre-functional (Specify) Outcome: Progressing Goal: RH LTG Interdisciplinary (Specify) 1 Description: RH LTG Interdisciplinary (Specify)1 Outcome: Progressing Goal: RH LTG Interdisciplinary (Specify) 2 Description: RH LTG Interdisciplinary (Specify) 2  Outcome: Progressing

## 2020-05-06 NOTE — Progress Notes (Signed)
Occupational Therapy Session Note  Patient Details  Name: Madison Hurst MRN: [id number] Date of Birth: June 07, 1960  Today's Date: 05/06/2020 OT Individual Time: 2751-7001 OT Individual Time Calculation (min): 59 min    Short Term Goals: Week 2:  OT Short Term Goal 1 (Week 2): Pt will complete LB dressing with min assist for donning brief and pants only sit to stand. OT Short Term Goal 2 (Week 2): Pt will complete UB dressing with min assist to donn pullover shirt. OT Short Term Goal 3 (Week 2): Pt will complete toileting tasks with min assist sit to stand.  Skilled Therapeutic Interventions/Progress Updates:    Session 1 [phone])  Madison Hurst worked on toileting, showering, and dressing during session.  Min assist was needed for supine to sit and for functional mobility to the bathroom with use of the RW.  Short step lengths noted with posterior LOB when stepping up the ramp to the bathroom.  She needed mod assist for management of clothing prior to toileting.  Toilet hygiene was completed with min assist however and then she needed mod assist for removal of LB clothing prior to shower.  Dressing stick was incorporated as well for removal of pants and gripper socks with therapist providing mod demonstrational cueing/assist.  She then transferred over to the 3:1 in the shower where she completed doffing of pullover shirt with min assist.  Shower was completed with overall mod assist including washing her hair.  She needed mod assist to shampoo her hair as well as apply conditioner secondary to limited shoulder AROM.  She used a LH sponge for washing her lower legs and feet with mod assist to maintain standing while therapist assisted with washing and rinsing her buttocks.  She dried off on the 3:1 and transferred to the wheelchair for dressing with min assist using the RW for support.  Pullover shirt was donned with min assist to lift over her head.  She was able to donn both the  brief and pants over her feet, but needed max assist for pulling them up over her hips.  Total assist was needed for TEDS with min assist for slip on crocs.  She finished session with completion of combing her hair with the comb and built up handle with supervision.  Her spouse was present at end of session.  Discussed her progress and need for large clothing to get up easier.  Finished with pt sitting in the wheelchair and with the call button and phone in reach and safety belt in place.   Session 2 (1405-1505) Pt in bed to start session.  She completed supine to sit EOB with min assist and then worked on donning her slip on shoes with min guard assist.  Stand pivot transfers were completed to the wheelchair at min assist as well with mod demonstrational cueing as pt was going to go around the back of the wheelchair for some unknown reason.  Once in the chair her BP was checked at 101/61, with no symptoms of dizziness reported.  Therapist took her down to the tub room where she worked on tub/shower transfers with use of the RW and the tub bench.  Min assist was needed to completion transfer in and out with mod instructional cueing for technique.  Pt with increased difficulty scooting onto and out of the shower bench secondary to her feet not being able to touch the ground.  Discussed in earlier session that she has a tub bench at home already  and her husband is looking into getting the tub converted over to a walk-in shower as soon as possible.  Next, took her to the therapy gym where moist head was applied to her hands and wrists and then gentle PROM stretching was completed to the wrist flexors and digit flexors bilaterally.  She continues to demonstrate limited bilateral wrist extension AROM with slight ulnar deviation of the digits and increased swelling.  She reported some increased pain with PROM.  Returned to the room at the end of the session with transfer back to the bed with min assist.  Once sitting on  the bed pt was confused stating "Do you want me to lay down in this bed?".  She stated that it was not her bed but it was instead Madison Hurst.  Re-directed pt that this was her room and her bed, the same one she has had the whole time.  She was left with the call button and phone in reach as well as safety bed alarm in place.    Therapy Documentation Precautions:  Precautions Precautions: Fall,Other (comment) Precaution Comments: EF 20-25%, L hemiparesis, RA (hands, shoulders, and ankles) Restrictions Weight Bearing Restrictions: No   Pain: Pain Assessment Pain Scale: Faces Faces Pain Scale: Hurts a little bit Pain Type: Acute pain Pain Location: Leg Pain Orientation: Right;Left Pain Descriptors / Indicators: Discomfort Pain Onset: With Activity Pain Intervention(s): Emotional support ADL: See Care Tool Section for some details of mobility and selfcare  Therapy/Group: Individual Therapy  Kamal Jurgens OTR/L 05/06/2020, 12:39 PM

## 2020-05-06 NOTE — Progress Notes (Signed)
Physical Therapy Session Note  Patient Details  Name: Madison Hurst MRN: [id number] Date of Birth: June 18, 1960  Today's Date: 05/06/2020 PT Individual Time: 0900-1000 PT Individual Time Calculation (min): 60 min   Short Term Goals: Week 1:  PT Short Term Goal 1 (Week 1): Pt will perform supine<>sit with CGA PT Short Term Goal 1 - Progress (Week 1): Progressing toward goal PT Short Term Goal 2 (Week 1): Pt will perform sit<>stand using LRAD with min assist PT Short Term Goal 2 - Progress (Week 1): Progressing toward goal PT Short Term Goal 3 (Week 1): Pt will perform bed<>chair transfers using LRAD with min assist PT Short Term Goal 3 - Progress (Week 1): Progressing toward goal PT Short Term Goal 4 (Week 1): Pt will ambulate at least 9ft using LRAD with min assist PT Short Term Goal 4 - Progress (Week 1): Progressing toward goal Week 2:  PT Short Term Goal 1 (Week 2): Pt will perform supine<>sit with CGA PT Short Term Goal 2 (Week 2): Pt will consistently perform sit<>stands using LRAD with min assist PT Short Term Goal 3 (Week 2): Pt will consistently perform bed<>chair transfers using LRAD with min assist PT Short Term Goal 4 (Week 2): Pt will ambulate at least 67ft using LRAD with min assist PT Short Term Goal 5 (Week 2): Pt will ascend/descend 2 (6" height) steps with min assist Week 3:     Skilled Therapeutic Interventions/Progress Updates:    PAIN 4/5 generalized shoulders/elbows/wrists.  MD suggested Diclofenac gel and ordered for pt.  Treatment to tolerance    Pt initially supine and agreeable to session.  MD in and inspected LE rash.  Suggested ABD binder due to pt not tolerating TED hose. Performed LE warm up therex to counteract OH including heel slides, clamshells, bridging x 10 each  Supine to sit w/min assist and use of rails  LAQs and ankle pumps in sitting for BP support. Sit to stand w/cga, post tendency, RW stand pivot transfer w/min assist,  shuffling steps, post tendency Pt transported to gym for session. Sit to stand w/min assist due to post lean.  Gait 82ft, 8ft w/RW, shuffling steps, cues to attend to parallel bars as obstacle to L.  In sitting pt c/o dizzyness when ressting, LE AROM helps to resolve symptoms. Standing balance: pt worked on transferring cups from table to R overhead to short shelf standing w/single UE support on RW, min assist due to post tendency.  Backing w/cues to increase ant lean to prevent post tendency, min assist. Pt c/o fatigue.  Transported to room.  Sit to stand w/cga, sidestepping x 61ft to L w/mod assist for balance. Sit to supine w/cues for sequencing.  Reviewed activities of session for memory notebook.  Pt unable to recall gait as primary activity of session despite cuing. Pt left supine w/rails up x 3, alarm set, bed in lowest position, and needs in reach.     Therapy Documentation Precautions:  Precautions Precautions: Fall,Other (comment) Precaution Comments: EF 20-25%, L hemiparesis, RA (hands, shoulders, and ankles) Restrictions Weight Bearing Restrictions: No    Therapy/Group: Individual Therapy  Rada Hay, PT   Shearon Balo 05/06/2020, 12:26 PM

## 2020-05-06 NOTE — Progress Notes (Signed)
Orthopedic Tech Progress Note Patient Details:  Madison Hurst [birth year] [id number] Dropped off ABDOMINAL BINDER to patient room, she was working with someone at that time Ortho Devices Type of Ortho Device: Abdominal binder Ortho Device/Splint Interventions: Other (comment)   Post Interventions Patient Tolerated: Well Instructions Provided: Care of device   Donald Pore 05/06/2020, 1:37 PM

## 2020-05-06 NOTE — Progress Notes (Signed)
Speech Language Pathology Weekly Progress and Session Note  Patient Details  Name: Madison Hurst MRN: [id number] Date of Birth: 11/28/1960  Beginning of progress report period: April 26, 2020 End of progress report period: May 06, 2020  Today's Date: 05/06/2020 SLP Individual Time: 0830-0900 SLP Individual Time Calculation (min): 30 min  Short Term Goals: Week 1: SLP Short Term Goal 1 (Week 1): Pt will utilize external memory aids to facilitate recall of daily information with min assist verbal cues SLP Short Term Goal 1 - Progress (Week 1): Not met SLP Short Term Goal 2 (Week 1): Pt will complete mildly complex functional tasks wtih min assist verbal cues. SLP Short Term Goal 2 - Progress (Week 1): Not met SLP Short Term Goal 3 (Week 1): Pt will recognize and correct errors in the moment when completing mildly complex tasks with min assist verbal cues. SLP Short Term Goal 3 - Progress (Week 1): Not met SLP Short Term Goal 4 (Week 1): Pt will selectively attend to functional tasks in a mildly distracting environment for 10 minutes with supervision verbal cues for redirection. SLP Short Term Goal 4 - Progress (Week 1): Met    New Short Term Goals: Week 2: SLP Short Term Goal 1 (Week 2): STGs=LTGs due to ELOS  Weekly Progress Updates: Patient has made slow but functional gains and has met 1 of 4 STGs this reporting period. Currently, patient requires overall Mod A verbal cues to complete functional and mildly complex tasks safely in regards to mildly complex problem solving, recall of new information with use of compensatory strategies and error awareness. However, patient demonstrates improved overall sustained attention to tasks. Patient and family education ongoing. Patient would benefit from continued skilled SLP intervention to maximize her cognitive functioning and overall functional independence prior to discharge.      Intensity: Minumum of 1-2 x/day, 30 to 90  minutes Frequency: 3 to 5 out of 7 days Duration/Length of Stay: 2/15 Treatment/Interventions: Cognitive remediation/compensation;Cueing hierarchy;Environmental controls;Functional tasks;Patient/family education;Internal/external aids;Therapeutic Activities   Daily Session  Skilled Therapeutic Interventions: Skilled treatment session focused on cognitive goals. Upon arrival, patient requested to use the bathroom. Patient ambulated to the commode with the RW with Min cues and was continent of bladder. SLP also facilitated session by providing Mod verbal and visual cues for utilization of external aids for recall of date and for recall of events from the weekend.  SLP also facilitated session by providing Max A verbal cues for recall of her current medications and their functions with plans for organizing a pill box during next session. Patient transferred back to bed at end of session and left with alarm on and all needs within reach. Continue with current plan of care.     Pain Pain Assessment Pain Scale: 0-10 Pain Score: 7  Pain Location: Back  Therapy/Group: Individual Therapy  PAYNE, COURTNEY 05/06/2020, 6:18 AM         

## 2020-05-06 NOTE — Progress Notes (Signed)
Madison Hurst PHYSICAL MEDICINE & REHABILITATION PROGRESS NOTE   Subjective/Complaints: Working with PT.  She has rash in her bilateral lower extremities-  Painful, not itching. She does get these at times at home. She thinks it is from her compression garments. Discussed use of abdominal binder.   Review of systems: Denies CP, SOB, N/V/D, +pain in bilateral rash in lower extremities.   Objective:   No results found. Recent Labs    05/05/20 0619 05/06/20 0620  WBC 9.1 10.2  HGB 9.1* 9.8*  HCT 27.6* 31.3*  PLT 200 233   No results for input(s): NA, K, CL, CO2, GLUCOSE, BUN, CREATININE, CALCIUM in the last 72 hours.  Intake/Output Summary (Last 24 hours) at 05/06/2020 0917 Last data filed at 05/06/2020 0715 Gross per 24 hour  Intake 820 ml  Output --  Net 820 ml        Physical Exam: Vital Signs Blood pressure 123/74, pulse 99, temperature 98.6 F (37 C), resp. rate 18, height 5' (1.524 m), weight 56.6 kg, SpO2 99 %. Gen: no distress, normal appearing HEENT: oral mucosa pink and moist, NCAT Cardio: Reg rate Chest: normal effort, normal rate of breathing Abd: soft, non-distended Ext: no edema Psych: pleasant, normal affect Skin: Contact dermatitis at ankles. Scattered ecchymosis throughout bilateral lower and upper extremities  Psych: Normal mood.  Normal behavior. Musc: No edema in extremities.  No tenderness in extremities. Neuro: Alert Motor: 4/5 throughout (?left slightly weaker)  Assessment/Plan: 1. Functional deficits which require 3+ hours per day of interdisciplinary therapy in a comprehensive inpatient rehab setting.  Physiatrist is providing close team supervision and 24 hour management of active medical problems listed below.  Physiatrist and rehab team continue to assess barriers to discharge/monitor patient progress toward functional and medical goals  Care Tool:  Bathing    Body parts bathed by patient: Right arm,Left arm,Chest,Abdomen,Front  perineal area,Right upper leg,Left upper leg,Face,Right lower leg,Left lower leg   Body parts bathed by helper: Buttocks     Bathing assist Assist Level: Minimal Assistance - Patient > 75%     Upper Body Dressing/Undressing Upper body dressing   What is the patient wearing?: Pull over shirt    Upper body assist Assist Level: Moderate Assistance - Patient 50 - 74%    Lower Body Dressing/Undressing Lower body dressing      What is the patient wearing?: Pants,Incontinence brief     Lower body assist Assist for lower body dressing: Maximal Assistance - Patient 25 - 49%     Toileting Toileting    Toileting assist Assist for toileting: Moderate Assistance - Patient 50 - 74%     Transfers Chair/bed transfer  Transfers assist     Chair/bed transfer assist level: Minimal Assistance - Patient > 75% Chair/bed transfer assistive device: Geologist, engineering   Ambulation assist      Assist level: Minimal Assistance - Patient > 75% Assistive device: Walker-rolling Max distance: 15ft   Walk 10 feet activity   Assist     Assist level: Minimal Assistance - Patient > 75% Assistive device: Walker-rolling   Walk 50 feet activity   Assist Walk 50 feet with 2 turns activity did not occur: Safety/medical concerns         Walk 150 feet activity   Assist Walk 150 feet activity did not occur: N/A (pt does not ambulate this distance at basline)         Walk 10 feet on uneven surface  activity  Assist Walk 10 feet on uneven surfaces activity did not occur: Safety/medical concerns         Wheelchair     Assist Will patient use wheelchair at discharge?: No             Wheelchair 50 feet with 2 turns activity    Assist            Wheelchair 150 feet activity     Assist          Blood pressure 123/74, pulse 99, temperature 98.6 F (37 C), resp. rate 18, height 5' (1.524 m), weight 56.6 kg, SpO2 99 %.   Medical  Problem List and Plan: 1.  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  Antithrombotics: -DVT/anticoagulation: Eliquis 5 mg twice daily, d/ced due to GI bleed and increasing hematoma, Neuro aware.    Lower extremity Dopplers negative             -antiplatelet therapy: Aspirin 81 mg daily 3. Pain Management: Continue Hydrocodone as needed             2/7: does have some pain in rash in lower extremities. She has been using hydrocodone twice per day- says she uses it once per day at home. Says she uses it for diffuse arthitic pains. She has not tried voltaren gel but is willing to.   Monitor with increased exertion 4. Mood: Cymbalta 60 mg daily, Wellbutrin 150 mg daily, Ativan 0.5 mg every 8 hours as needed             -antipsychotic agents: N/A 5. Neuropsych: This patient is capable of making decisions on her own behalf. 6. Skin/Wound Care: Routine skin checks 7. Fluids/Electrolytes/Nutrition: Routine in and outs.   8.  History of PAF/CAD/PFO.  30-day cardiac event monitor per cardiology services Dr.Kardie Tobb.  Continue Eliquis for now.  If EF improves and no atrial fibrillation in 3 months plan to stop Eliquis and restart Plavix.             Monitor with increased activity 9.  Transaminitis.  Follow-up gastroenterology service Dr. Marina Goodell.               LFTs elevated on 1/31, AST and ALT trending downward on 2/7, repeat CMP ordered for 2/14.  10.  Hyperlipidemia.  Crestor on hold due to elevated LFTs. 11.  Rheumatoid arthritis: Low-dose prednisone as well as weekly Orencia (was on Ansid at home flurbiprofen)             Having rheumatoid flare  20mg  prednisone x 2d,  reduce to 10mg  on 2/3 and 5mg  on 2/1may have caused gastritis 12.  Diabetes mellitus.  Hemoglobin A1c 6.1.  SSI.            CBG (last 3)  Recent Labs    05/05/20 1644 05/05/20 2117 05/06/20 0559  GLUCAP 196* 121* 83   2/7: 83 to 196 on 2/7.  Creatinine is 0.61- home dose metformin 500mg  daily has been restarted. Add 250mg  with lunch.  13.  Hypothyroidism: Synthroid 14.  Essential hypertension.. Blood pressure had remained a bit soft Toprol-XL resumed at 12.5 mg daily 04/26/2020   Vitals:   05/05/20 2010 05/06/20 0346  BP: 107/78 123/74  Pulse: (!) 59 99  Resp: 18 18  Temp: 98.7 F (37.1 C) 98.6 F (37 C)  SpO2: 95% 99%   Soft, but stable on 2/5, HR borderline elevated,  will not make changes at this time 15.  Constipation but multiple stools post sorbitol 1/29 16. Sleep disturbance: melatonin 3mg  started HS  Improving 17.  GI bleed with drop in Hgb, suspect lower GI bleed given bright red blood  Hb 10.1 on 2/5, cont to monitor  LOS: 10 days A FACE TO FACE EVALUATION WAS PERFORMED  Drema Pry Yannick Steuber 05/06/2020, 9:17 AM

## 2020-05-07 ENCOUNTER — Inpatient Hospital Stay (HOSPITAL_COMMUNITY): Payer: Medicare PPO

## 2020-05-07 LAB — GLUCOSE, CAPILLARY
Glucose-Capillary: 147 mg/dL — ABNORMAL HIGH (ref 70–99)
Glucose-Capillary: 177 mg/dL — ABNORMAL HIGH (ref 70–99)
Glucose-Capillary: 154 mg/dL — ABNORMAL HIGH (ref 70–99)
Glucose-Capillary: 136 mg/dL — ABNORMAL HIGH (ref 70–99)

## 2020-05-07 IMAGING — CT CT HEAD W/O CM
3 series · 15 of 47 positions shown, 18 images · non-contrast
Comparison: [DATE]

CLINICAL DATA: Suspected head injury

EXAM:
CT HEAD WITHOUT CONTRAST
TECHNIQUE: Contiguous axial images were obtained from the base of the skull
through the vertex without intravenous contrast.

[Series 3: head 5.0 h30s · axial · 0.46mm/px · z∈[-208,-63]mm · 9 of 35 slices shown, 12 images]
[im 3/35  brain]
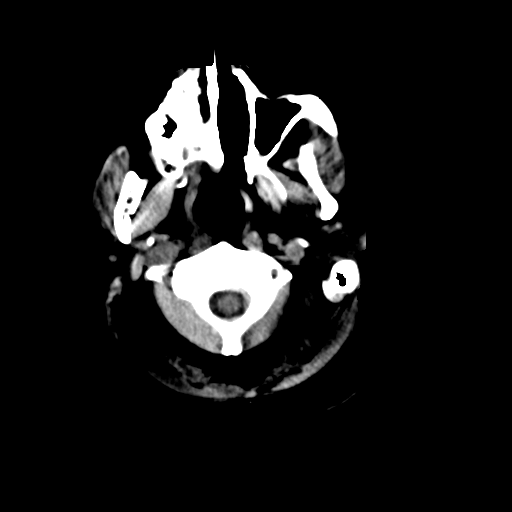
[im 3/35  bone]
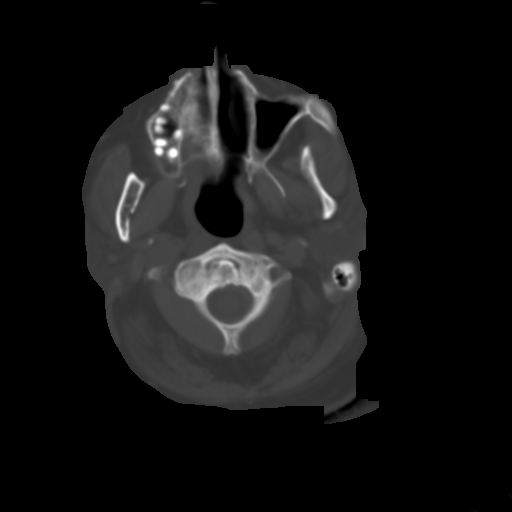
[im 6/35  brain]
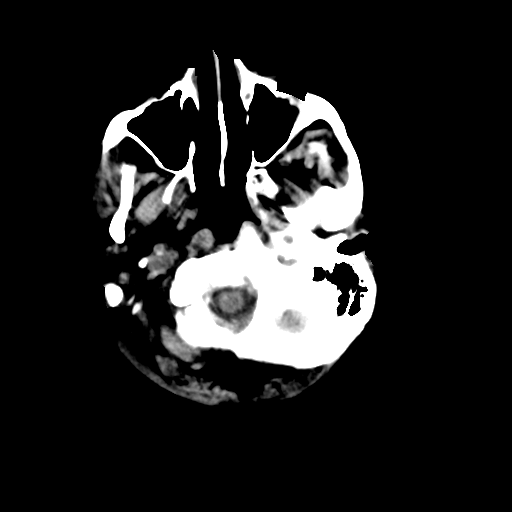
[im 10/35  brain]
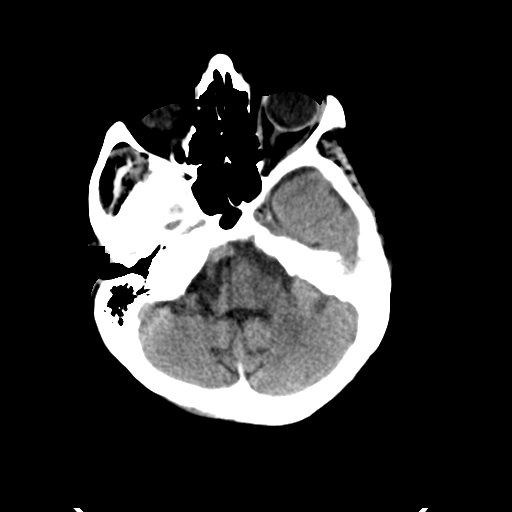
[im 13/35  brain]
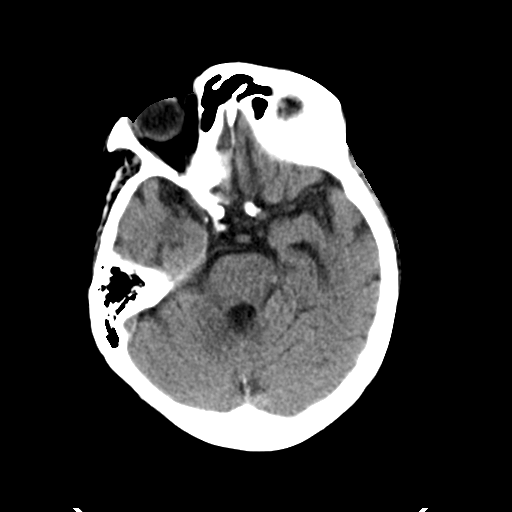
[im 18/35  brain]
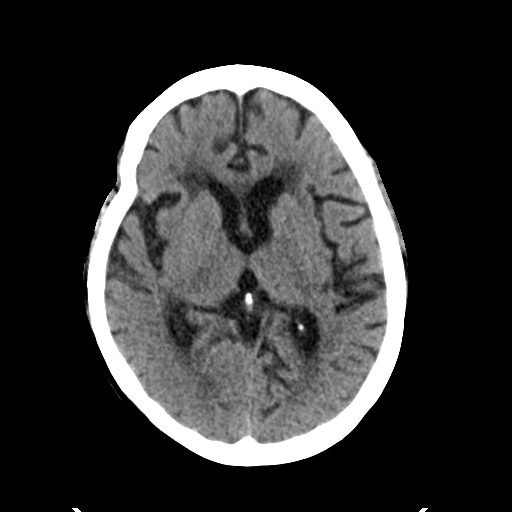
[im 18/35  bone]
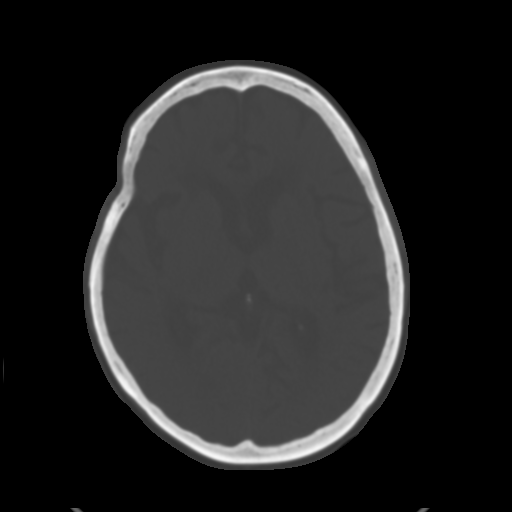
[im 22/35  brain]
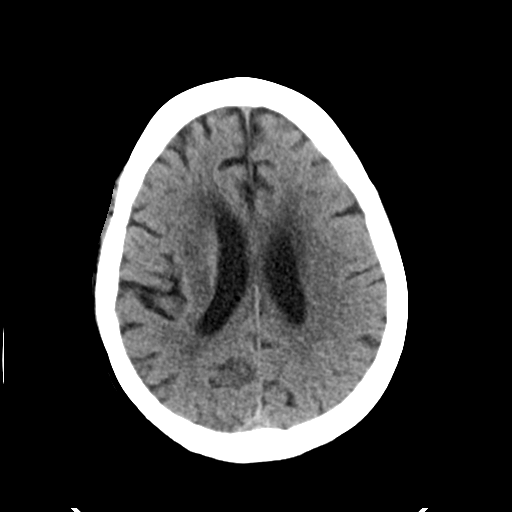
[im 25/35  brain]
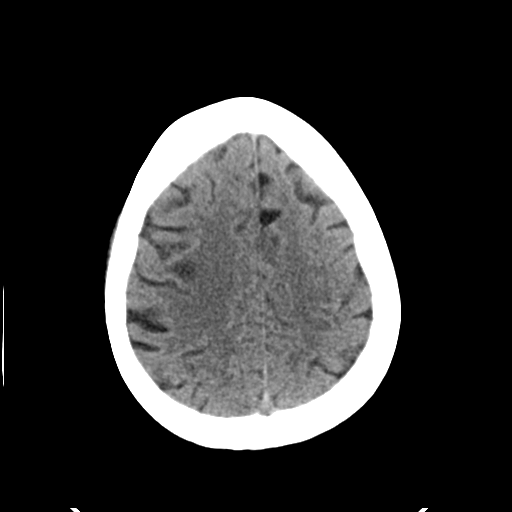
[im 29/35  brain]
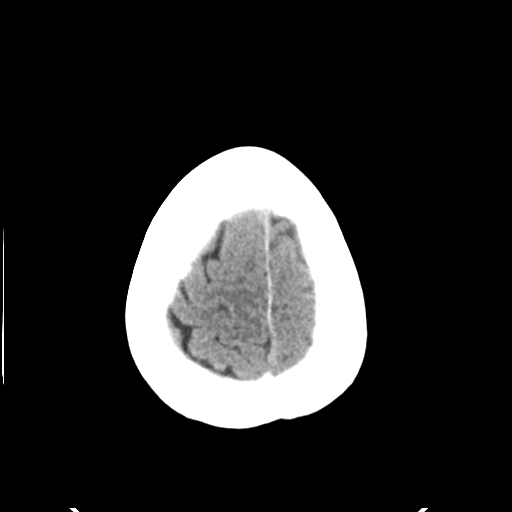
[im 32/35  brain]
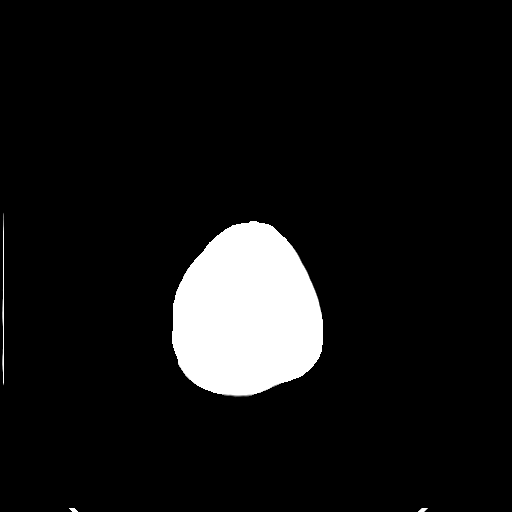
[im 32/35  bone]
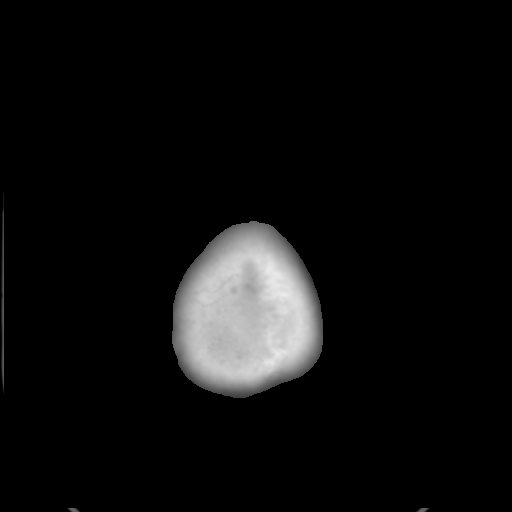

[Series 5: head 3.0 mpr cor · coronal · 0.34mm/px · 3 of 74 slices shown]
[im 25/74  brain]
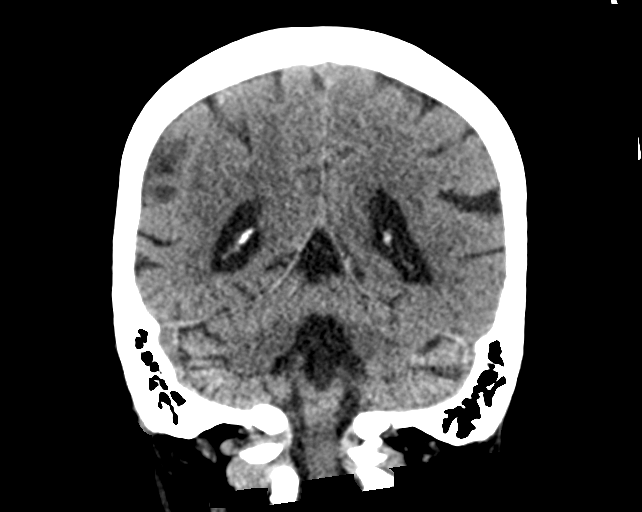
[im 33/74  brain]
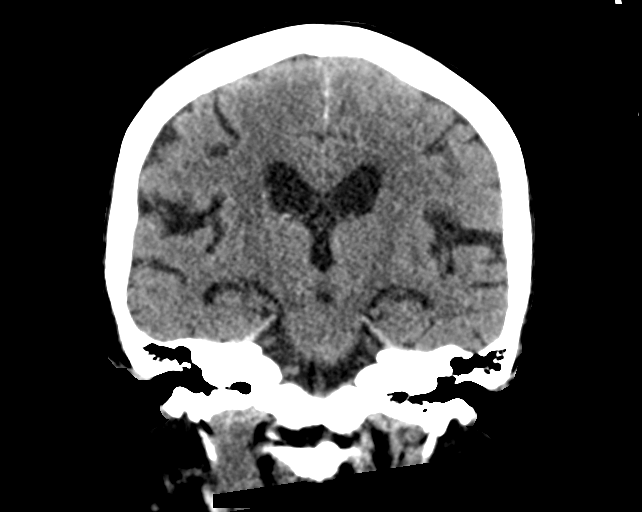
[im 41/74  brain]
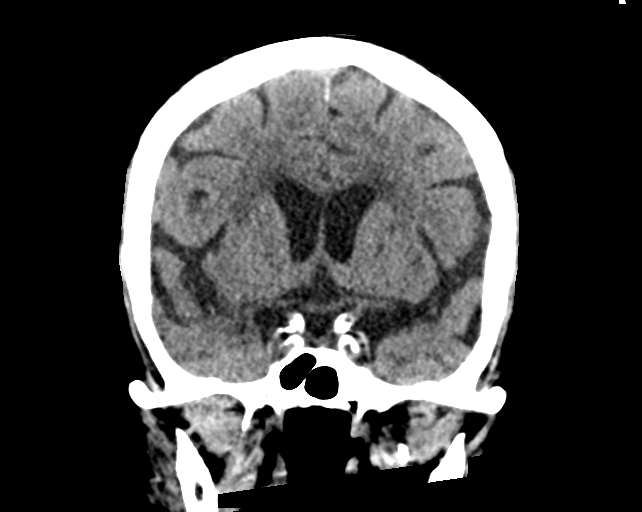

[Series 6: head 3.0 mpr sag · sagittal · 0.36mm/px · 3 of 67 slices shown]
[im 24/67  brain]
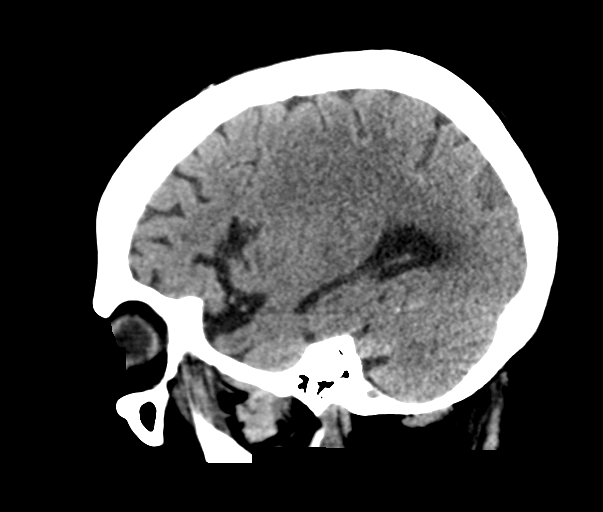
[im 34/67  brain]
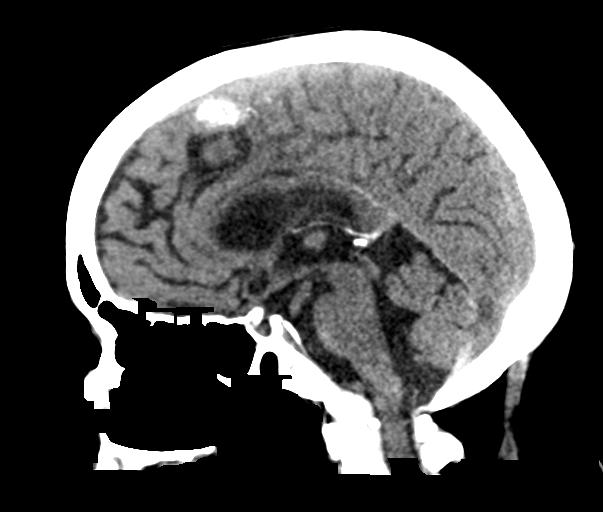
[im 44/67  brain]
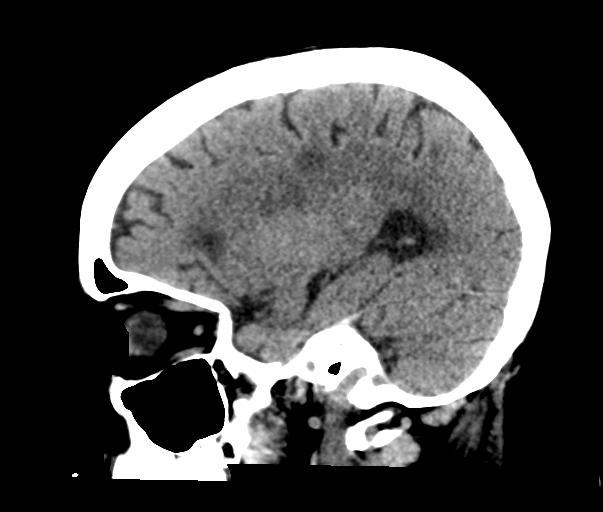

[15 of 47 positions shown; findings below may reference images not displayed]

FINDINGS: Brain: There is atrophy and chronic small vessel disease changes. No
acute intracranial abnormality. Specifically, no hemorrhage,
hydrocephalus, mass lesion, acute infarction, or significant
intracranial injury.

Vascular: No hyperdense vessel or unexpected calcification.

Skull: No acute calvarial abnormality.

Sinuses/Orbits: Visualized paranasal sinuses and mastoids clear.
Orbital soft tissues unremarkable.

Other: None
IMPRESSION: Atrophy, chronic microvascular disease.

No acute intracranial abnormality.

## 2020-05-07 MED ORDER — METFORMIN HCL 500 MG PO TABS
500.0000 mg | ORAL_TABLET | Freq: Two times a day (BID) | ORAL | Status: DC
  Administered 2020-05-07 – 2020-05-16 (×18): 500 mg via ORAL
  Filled 2020-05-07 (×19): qty 1

## 2020-05-07 NOTE — Progress Notes (Signed)
Speech Language Pathology Daily Session Note  Patient Details  Name: Madison Hurst MRN: [id number] Date of Birth: [birth year]  Today's Date: 05/07/2020 SLP Individual Time: 3710-6269 SLP Individual Time Calculation (min): 44 min  Short Term Goals: Week 2: SLP Short Term Goal 1 (Week 2): STGs=LTGs due to ELOS  Skilled Therapeutic Interventions: Pt was seen for skilled ST targeting cognitive goals. SLP facilitated session by providing Min A verbal and visual cues to utilize memory notebook as compensatory strategy/external aid to recall targets of last ST session. Pt also disoriented to date by ~1 week - Min A verbal cues provided for her to correctly orient to today's date. SLP further facilitated session with Mod A verbal and visual cudes for error awareness and problem solving to recreate basic PEG board designs from photo representations. However, as soon as complexity of designs increased slightly (semi-complex), increased Max A cueing required for those skill areas. Pt continues to require fluctuating Min-Mod A for working memory and carryover of strategies within sessions. Pt left sitting upright in bed with alarm set and needs within reach. Continue per current plan of care.        Pain Pain Assessment Pain Scale: 0-10 Pain Score: 0-No pain  Therapy/Group: Individual Therapy  [name] 05/07/2020, 7:23 AM

## 2020-05-07 NOTE — Progress Notes (Signed)
Occupational Therapy Session Note  Patient Details  Name: Madison Hurst MRN: [id number] Date of Birth: 1960/09/24  Today's Date: 05/07/2020 OT Individual Time: 1302-1350 OT Individual Time Calculation (min): 48 min    Short Term Goals: Week 2:  OT Short Term Goal 1 (Week 2): Pt will complete LB dressing with min assist for donning brief and pants only sit to stand. OT Short Term Goal 2 (Week 2): Pt will complete UB dressing with min assist to donn pullover shirt. OT Short Term Goal 3 (Week 2): Pt will complete toileting tasks with min assist sit to stand.  Skilled Therapeutic Interventions/Progress Updates:    Pt sitting in wheelchair to start session with spouse present.  BP taken at 98/73 in sitting without the abdominal binder.  She was oriented to place but not day of the week stating "Monday".  Therapist took her down to the dayroom where she transferred to the therapy mat with min assist using the RW for support.  Had her work on standing balance with use of the mirror for feedback.  She demonstrated increased right head tilt and lean in standing.  Incorporated reaching side to side to pick up checkers and place in the [employer] Grid in standing with min assist.  Increased posterior lean noted at times.  She needed multiple rest breaks with max instructional cueing to try and reach back for surfaces with sitting and not to leave her hands on the walker.  At times she needed, mod instructional cueing to not stand up before therapist was beside of her.  Returned to the wheelchair with min assist and then back to the room at end of session.  She requested to transfer back to the bed, denying the need to toilet when asked by her spouse.  She completed transfer with min assist.  Call button and phone in reach with safety alarm in place.  Discussed with spouse the need for 24 hour assist secondary to cognitive deficits and balance deficits.    Therapy Documentation Precautions:   Precautions Precautions: Fall Precaution Comments: EF 20-25%, L hemiparesis, RA (hands, shoulders, and ankles) Restrictions Weight Bearing Restrictions: No  Pain: Pain Assessment Pain Scale: Faces Pain Score: 0-No pain ADL: See Care Tool Section for some details of mobility and selfcare  Therapy/Group: Individual Therapy  Teddy Rebstock OTR/L 05/07/2020, 3:50 PM

## 2020-05-07 NOTE — Progress Notes (Signed)
[facility] PHYSICAL MEDICINE & REHABILITATION PROGRESS NOTE   Subjective/Complaints: Diclofenac helped with pain Abdominal binder has been delivered, appreciate ortho tech note On commode currently, bedside  Review of systems: Denies CP, SOB, N/V/D, +pain in bilateral rash in lower extremities.   Objective:   No results found. Recent Labs    05/05/20 0619 05/06/20 0620  WBC 9.1 10.2  HGB 9.1* 9.8*  HCT 27.6* 31.3*  PLT 200 233   No results for input(s): NA, K, CL, CO2, GLUCOSE, BUN, CREATININE, CALCIUM in the last 72 hours.  Intake/Output Summary (Last 24 hours) at 05/07/2020 1013 Last data filed at 05/07/2020 0700 Gross per 24 hour  Intake 480 ml  Output --  Net 480 ml        Physical Exam: Vital Signs Blood pressure 103/69, pulse 92, temperature 98.8 F (37.1 C), resp. rate 16, height 5' (1.524 m), weight 56.6 kg, SpO2 100 %. Gen: no distress, normal appearing HEENT: oral mucosa pink and moist, NCAT Cardio: Reg rate Chest: normal effort, normal rate of breathing Abd: soft, non-distended Ext: no edema Psych: pleasant, normal affect Skin: Contact dermatitis at ankles. Scattered ecchymosis throughout bilateral lower and upper extremities  Psych: Normal mood.  Normal behavior. Musc: No edema in extremities.  No tenderness in extremities. Neuro: Alert Motor: 4/5 throughout (?left slightly weaker)  Assessment/Plan: 1. Functional deficits which require 3+ hours per day of interdisciplinary therapy in a comprehensive inpatient rehab setting.  Physiatrist is providing close team supervision and 24 hour management of active medical problems listed below.  Physiatrist and rehab team continue to assess barriers to discharge/monitor patient progress toward functional and medical goals  Care Tool:  Bathing    Body parts bathed by patient: Right arm,Left arm,Chest,Abdomen,Front perineal area,Right upper leg,Left upper leg,Face,Right lower leg,Left lower leg   Body  parts bathed by helper: Buttocks     Bathing assist Assist Level: Minimal Assistance - Patient > 75%     Upper Body Dressing/Undressing Upper body dressing   What is the patient wearing?: Pull over shirt    Upper body assist Assist Level: Minimal Assistance - Patient > 75%    Lower Body Dressing/Undressing Lower body dressing      What is the patient wearing?: Pants,Incontinence brief     Lower body assist Assist for lower body dressing: Maximal Assistance - Patient 25 - 49%     Toileting Toileting    Toileting assist Assist for toileting: Moderate Assistance - Patient 50 - 74%     Transfers Chair/bed transfer  Transfers assist     Chair/bed transfer assist level: Minimal Assistance - Patient > 75% Chair/bed transfer assistive device: Geologist, engineering   Ambulation assist      Assist level: Minimal Assistance - Patient > 75% Assistive device: Walker-rolling Max distance: 12'   Walk 10 feet activity   Assist     Assist level: Minimal Assistance - Patient > 75% Assistive device: Walker-rolling   Walk 50 feet activity   Assist Walk 50 feet with 2 turns activity did not occur: Safety/medical concerns         Walk 150 feet activity   Assist Walk 150 feet activity did not occur: N/A (pt does not ambulate this distance at basline)         Walk 10 feet on uneven surface  activity   Assist Walk 10 feet on uneven surfaces activity did not occur: Safety/medical concerns         Wheelchair  Assist Will patient use wheelchair at discharge?: No             Wheelchair 50 feet with 2 turns activity    Assist            Wheelchair 150 feet activity     Assist          Blood pressure 103/69, pulse 92, temperature 98.8 F (37.1 C), resp. rate 16, height 5' (1.524 m), weight 56.6 kg, SpO2 100 %.   Medical Problem List and Plan: 1.  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  Antithrombotics: -DVT/anticoagulation: Eliquis 5 mg twice daily, d/ced due to GI bleed and increasing hematoma, Neuro aware.    Lower extremity Dopplers negative             -antiplatelet therapy: Aspirin 81 mg daily 3. Pain Management: Continue Hydrocodone as needed             2/7: does have some pain in rash in lower extremities. She has been using hydrocodone twice per day- says she uses it once per day at home. Says she uses it for diffuse arthitic pains. She has not tried voltaren gel but is willing to.   2/8: diclofenac gel helped, continue  Monitor with increased exertion 4. Mood: Cymbalta 60 mg daily, Wellbutrin 150 mg daily, Ativan 0.5 mg every 8 hours as needed             -antipsychotic agents: N/A 5. Neuropsych: This patient is capable of making decisions on her own behalf. 6. Skin/Wound Care: Routine skin checks 7. Fluids/Electrolytes/Nutrition: Routine in and outs.   8.  History of PAF/CAD/PFO.  30-day cardiac event monitor per cardiology services Dr.Kardie Tobb.  Continue Eliquis for now.  If EF improves and no atrial fibrillation in 3 months plan to stop Eliquis and restart Plavix.             Monitor with increased activity 9.  Transaminitis.  Follow-up gastroenterology service Dr. Marina Goodell.               LFTs elevated on 1/31, AST and ALT trending downward on 2/7, repeat CMP ordered for 2/14.  10.  Hyperlipidemia.  Crestor on hold due to elevated LFTs. 11.  Rheumatoid arthritis: Low-dose prednisone as well as weekly Orencia (was on Ansid at home flurbiprofen)             Having rheumatoid flare  20mg  prednisone x 2d,  reduce to 10mg  on 2/3 and 5mg  on 2/3may have caused gastritis 12.  Diabetes mellitus.  Hemoglobin A1c 6.1.  SSI.            CBG (last 3)  Recent Labs    05/06/20 1621 05/06/20 2100 05/07/20 0530  GLUCAP 159* 118* 147*   2/8: CBGs 118-159: increase metformin with lunch to 500mg . Will maintain  Toprol since still tachy.  13.  Hypothyroidism: Synthroid 14.  Essential hypertension.. Blood pressure had remained a bit soft Toprol-XL resumed at 12.5 mg daily 04/26/2020   Vitals:   05/06/20 1954 05/07/20 0533  BP: 109/71 103/69  Pulse: 96 92  Resp: 16 16  Temp: 98.2 F (36.8 C) 98.8 F (37.1 C)  SpO2: 99% 100%   Soft, but stable on 2/8:  15.  Constipation but multiple stools post sorbitol 1/29 16. Sleep disturbance: melatonin 3mg  started HS  Improving 17.  GI bleed with drop in Hgb, suspect lower GI  bleed given bright red blood  Hb 10.1 on 2/5, cont to monitor 18. Tachycardia: continue Toprol.   LOS: 11 days A FACE TO FACE EVALUATION WAS PERFORMED  Clint Bolder P Quintrell Baze 05/07/2020, 10:13 AM

## 2020-05-07 NOTE — Progress Notes (Deleted)
Nurse called into patient room by NT who noticed pt lying on floor. Fall unwitnessed. Nurse noted pt to be laying supine under sink. Pt alert and oriented x3, denied pain at this time, neuro assessment completed and WNL. Pt assisted to bed by staff. fall protocol initiated, NP pam notifed of pt status, STAT CT ordered. Pt in no distress, breathing even and unlabored, no SOB, Denies HA/dizziness/acute pain at this time.

## 2020-05-07 NOTE — Progress Notes (Signed)
Nurse called into patient room by NT who noticed pt lying on floor. Fall unwitnessed. Nurse noted pt to be laying supine under sink. Pt alert and oriented x3, denied pain at this time, neuro assessment completed and WNL. Pt assisted to bed by staff. fall protocol initiated, NP pam notifed of pt status, STAT CT ordered. Pt in no distress, breathing even and unlabored, no SOB, Denies HA/dizziness/acute pain at this time.  

## 2020-05-07 NOTE — Progress Notes (Signed)
   05/07/20 1900  Neurological  Neuro (WDL) WDL  Orientation Level Oriented to person;Oriented to place;Oriented to situation  Cognition Appropriate at baseline;Follows commands;Memory impairment;Poor safety awareness;Impulsive  Speech Clear  Pupil Assessment  Yes  R Pupil Size (mm) 2  R Pupil Shape Round  R Pupil Reaction Brisk  L Pupil Size (mm) 2  L Pupil Shape Round  L Pupil Reaction Brisk  Motor Function/Sensation Assessment Grip;Sensation;Motor response;Leg ataxia;Dorsiflexion;Motor strength  Facial Symmetry Symmetrical  R Hand Grip Present;Moderate  L Hand Grip Present;Moderate   L Foot Dorsiflexion Present;Moderate  R Foot Plantar Flexion Present;Moderate  L Foot Plantar Flexion Present;Moderate  R Heel to [employer] ([place] to [employer]) Smooth  L Heel to [employer] ([facility] to [employer]) Smooth  RUE Motor Response Purposeful movement  RUE Sensation Full sensation  RUE Motor Strength 5  LUE Motor Response Purposeful movement  LUE Sensation Full sensation  LUE Motor Strength 5  RLE Motor Response Purposeful movement  RLE Sensation Full sensation  RLE Motor Strength 4  LLE Motor Response Purposeful movement  LLE Sensation Full sensation  LLE Motor Strength 4  Neurological  Level of Consciousness Alert

## 2020-05-07 NOTE — Progress Notes (Signed)
Occupational Therapy Session Note  Patient Details  Name: Madison Hurst MRN: [id number] Date of Birth: 1960-09-28  Today's Date: 05/07/2020 OT Individual Time: 3094-0768 OT Individual Time Calculation (min): 60 min    Short Term Goals: Week 2:  OT Short Term Goal 1 (Week 2): Pt will complete LB dressing with min assist for donning brief and pants only sit to stand. OT Short Term Goal 2 (Week 2): Pt will complete UB dressing with min assist to donn pullover shirt. OT Short Term Goal 3 (Week 2): Pt will complete toileting tasks with min assist sit to stand.  Skilled Therapeutic Interventions/Progress Updates:    Patient in bed, alert and ready for therapy session.  She denies pain and requests a shower.  Supine to sitting edge of bed with min A.  Sit to stand and ambulation with RW to/from bed, toilet, shower bench, w/c with min A cues for midline and balance.   Mod A for toileting.  Min A for shower seated on shower bench with raised surface for feet - posterior lean (worse with eyes closed)    completed dressing w/c level - min/mod A for LB dressing, min A for OH shirt, max A for slipper socks, clothing management in stance max A due to posterior LOB.  Min A for hair care, set up for oral care.   Completed standing balance and weight shift activities with min A.  She remained seated in w/c at close of session, seat belt alarm set and call bell in hand.    Therapy Documentation Precautions:  Precautions Precautions: Fall,Other (comment) Precaution Comments: EF 20-25%, L hemiparesis, RA (hands, shoulders, and ankles) Restrictions Weight Bearing Restrictions: No   Therapy/Group: Individual Therapy  Barrie Lyme 05/07/2020, 7:35 AM

## 2020-05-07 NOTE — Progress Notes (Signed)
Physical Therapy Session Note  Patient Details  Name: Madison Hurst MRN: [id number] Date of Birth: September 08, 1960  Today's Date: 05/07/2020 PT Individual Time: 3664-4034 PT Individual Time Calculation (min): 45 min   Short Term Goals: Week 2:  PT Short Term Goal 1 (Week 2): Pt will perform supine<>sit with CGA PT Short Term Goal 2 (Week 2): Pt will consistently perform sit<>stands using LRAD with min assist PT Short Term Goal 3 (Week 2): Pt will consistently perform bed<>chair transfers using LRAD with min assist PT Short Term Goal 4 (Week 2): Pt will ambulate at least 29ft using LRAD with min assist PT Short Term Goal 5 (Week 2): Pt will ascend/descend 2 (6" height) steps with min assist  Skilled Therapeutic Interventions/Progress Updates:    Pt received sitting in w/c and agreeable to therapy session. Vitals assessed sitting at rest: BP 98/85 (MAP 91), HR 104bpm.  Transported to/from gym in w/c for time management and energy conservation. Sit>stand w/c>RW CGA and cuing for proper UE placement for safety with AD. Gait training 44ft using RW min assist for balance due to intermittent posterior lean - continues to demo lack of R pelvic weight shift onto R LE during stance despite constant R lateral trunk flexion/lean throughout - continues with short shuffled steps. Sit>stand EOM>RW varying min/mod assist due to continued strong posterior lean with poor balance recovery strategies despite cuing. Standing BUE support on RW repeated L LE foot taps on/off 4" step with min/mod assist for balance due to intermittently strong posterior lean with pt unable to recover without assist - mirror feedback for improved midline orientation - continues to have R lateral trunk flexion. Requires frequent seated rest breaks with B LE muscle twitches noted indicating fatigue. Reassessed vitals: BP 111/76 (MAP 89), HR 101bpm Sitting on mat pt has posterior trunk lean using B UE support on RW for balance -  cuing for correction and anterior weight shift. Gait training 25ft using RW with min assist and continued gait impairments as noted above - facilitation and cuing for improvement. Transported back to room and pt agreeable to remain sitting in w/c. Therapist had pt recall therapeutic interventions performed during session to write in memory notebook with pt having increased difficulty recalling these when in the room compared to when in the therapy gym. Pt left with needs in reach and seat belt alarm on.  Therapy Documentation Precautions:  Precautions Precautions: Fall,Other (comment) Precaution Comments: EF 20-25%, L hemiparesis, RA (hands, shoulders, and ankles) Restrictions Weight Bearing Restrictions: No  Pain: Denies pain during session. States she is doing well with her pain today.   Therapy/Group: Individual Therapy  Ginny Forth , PT, DPT, CSRS  05/07/2020, 7:54 AM

## 2020-05-08 LAB — GLUCOSE, CAPILLARY
Glucose-Capillary: 106 mg/dL — ABNORMAL HIGH (ref 70–99)
Glucose-Capillary: 149 mg/dL — ABNORMAL HIGH (ref 70–99)
Glucose-Capillary: 166 mg/dL — ABNORMAL HIGH (ref 70–99)
Glucose-Capillary: 144 mg/dL — ABNORMAL HIGH (ref 70–99)
Glucose-Capillary: 140 mg/dL — ABNORMAL HIGH (ref 70–99)

## 2020-05-08 LAB — URINALYSIS, MICROSCOPIC (REFLEX)

## 2020-05-08 LAB — URINALYSIS, ROUTINE W REFLEX MICROSCOPIC
Bilirubin Urine: NEGATIVE
Glucose, UA: NEGATIVE mg/dL
Hgb urine dipstick: NEGATIVE
Ketones, ur: NEGATIVE mg/dL
Nitrite: NEGATIVE
Protein, ur: NEGATIVE mg/dL
Specific Gravity, Urine: 1.02 (ref 1.005–1.030)
pH: 5.5 (ref 5.0–8.0)

## 2020-05-08 MED ORDER — MELATONIN 5 MG PO TABS
5.0000 mg | ORAL_TABLET | Freq: Every day | ORAL | Status: DC
  Administered 2020-05-08 – 2020-05-15 (×8): 5 mg via ORAL
  Filled 2020-05-08 (×8): qty 1

## 2020-05-08 NOTE — Progress Notes (Signed)
Physical Therapy Session Note  Patient Details  Name: Madison Hurst MRN: [id number] Date of Birth: Apr 27, 1960  Today's Date: 05/08/2020 PT Individual Time: 0910-0956 PT Individual Time Calculation (min): 46 min   and  Today's Date: 05/08/2020 PT Missed Time: 14 Minutes Missed Time Reason: Patient fatigue  Short Term Goals: Week 2:  PT Short Term Goal 1 (Week 2): Pt will perform supine<>sit with CGA PT Short Term Goal 2 (Week 2): Pt will consistently perform sit<>stands using LRAD with min assist PT Short Term Goal 3 (Week 2): Pt will consistently perform bed<>chair transfers using LRAD with min assist PT Short Term Goal 4 (Week 2): Pt will ambulate at least 71ft using LRAD with min assist PT Short Term Goal 5 (Week 2): Pt will ascend/descend 2 (6" height) steps with min assist  Skilled Therapeutic Interventions/Progress Updates:    Pt received supine in bed reporting she was waiting for someone to come assist her to the bathroom despite call light not being on. Upon initiation of OOB mobility to the L pt suddenly starts to try and cover her legs with a blanket reporting she was cold - loosing awareness that she was starting to get OOB to go to the bathroom - therapist reoriented her to the task - required min assist to bring trunk upright and mod assist to scoot hips to EOB. In sitting, pt demos constant posterior lean with poor ability to recover to midline requiring min/mod assist - donned socks and shoes total assist. Had pt perform x8 reps of forward trunk leans to reach down and touch her feet to decrease posterior lean and activate anterior trunk musculature. Sit>stand 2x EOB>RW 1st time having strong posterior lean requiring mod assist for balance and using backs of legs against bed - 2nd time demos improved anterior weight shift upon coming upright. Gait ~33ft to bathroom using RW with min/mod assist for balance - continues to demo strong R lateral trunk flexion with L  pelvic weight shift - shuffled gait - max cuing for sequencing AD management and LE stepping up bathroom threshold. Standing with UE support on RW performed LB clothing management total assist with min/mod assist for balance due to varying posterior lean. Pt reports she was continent of bladder but no noticeable urine in toilet. Pt attempting to perform seated peri-care but unable to reach and poor awareness pulling on RW to help her lean forward. Therapist directed in improved motor planning but pt still unable to reach peri-area due to RA. Sit>stand toilet>grab bar and RW with mod assist for lifting and balance again due to posterior lean during total assist peri-care. Required seated rest break on toilet prior to ambulating back out of bathroom to w/c using RW requiring mod assist for balance with above gait impairments but worsening R lateral trunk lean. Upon sitting in w/c therapist states "I know you are cold so I'm going to give you this blanket" with pt stating she thought therapist said that a "no-no" was going to get her stating she thought that was the "name of the animal on the picture in there that looked like a snake without a nose" after further questioning pt states she saw it a "few minutes ago" during an "advertisement on TV" but then continues to talk about "bean, snakes, and guys" with confusion - therapist asked orientation questions with pt stating she is at "[facility]" for "mini-strokes" but though it was "Thursday" but remembers this therapist; however, with further conversation and questioning pt  demonstrates increasing confusion with difficulty recalling that we had just went to the abthroom with pt stating "we tried to go to the bathroom but it was locked because it is Sunday" then states we are at the "lock[facility] the community center" (of note pt sitting in w/c with bathroom directly on her R. Assessed vitals: L arm: BP 171/140 (MAP 149), HR 103bpm; Reassessed in R arm: BP 168/153  (MAP 160), HR 115bpm - of not pt visibly shaking during this time although denying being cold. RN notified to assess vitals manually along with BS - notified her of pt's confusion and she planned to speak with PA. Pt with increasing fatigue therefore assisted back to bed. L stand pivot w/c>EOB using RW mod assist for balance. Sit>supine mod assist B LE management in bed. Pt left supine in bed in the care of RN. Missed 14 minutes of skilled physical therapy.  Therapy Documentation Precautions:  Precautions Precautions: Fall Precaution Comments: EF 20-25%, L hemiparesis, RA (hands, shoulders, and ankles) Restrictions Weight Bearing Restrictions: No  Pain:   Denies pain during session.   Therapy/Group: Individual Therapy  Ginny Forth , PT, DPT, CSRS  05/08/2020, 7:46 AM

## 2020-05-08 NOTE — Progress Notes (Signed)
[facility] PHYSICAL MEDICINE & REHABILITATION PROGRESS NOTE   Subjective/Complaints: Noted to have increased confusion this morning.  CT head w/o contrast was ordered yesterday and shows no acute abnormality. Diclofenac gel has helped her arthritic pain- has not required Norco in 2 days.   Review of systems: Denies CP, SOB, N/V/D, +pain in bilateral rash in lower extremities, insomnia  Objective:   CT HEAD WO CONTRAST  Result Date: 05/07/2020 CLINICAL DATA:  Suspected head injury EXAM: CT HEAD WITHOUT CONTRAST TECHNIQUE: Contiguous axial images were obtained from the base of the skull through the vertex without intravenous contrast. COMPARISON:  04/22/2020 FINDINGS: Brain: There is atrophy and chronic small vessel disease changes. No acute intracranial abnormality. Specifically, no hemorrhage, hydrocephalus, mass lesion, acute infarction, or significant intracranial injury. Vascular: No hyperdense vessel or unexpected calcification. Skull: No acute calvarial abnormality. Sinuses/Orbits: Visualized paranasal sinuses and mastoids clear. Orbital soft tissues unremarkable. Other: None IMPRESSION: Atrophy, chronic microvascular disease. No acute intracranial abnormality. Electronically Signed   By: Charlett Nose M.D.   On: 05/07/2020 20:01   Recent Labs    05/06/20 0620  WBC 10.2  HGB 9.8*  HCT 31.3*  PLT 233   No results for input(s): NA, K, CL, CO2, GLUCOSE, BUN, CREATININE, CALCIUM in the last 72 hours.  Intake/Output Summary (Last 24 hours) at 05/08/2020 1021 Last data filed at 05/08/2020 0850 Gross per 24 hour  Intake 240 ml  Output --  Net 240 ml        Physical Exam: Vital Signs Blood pressure 96/65, pulse 95, temperature 98.9 F (37.2 C), temperature source Oral, resp. rate 16, height 5' (1.524 m), weight 56.6 kg, SpO2 99 %. Gen: no distress, normal appearing HEENT: oral mucosa pink and moist, NCAT Cardio: Reg rate Chest: normal effort, normal rate of breathing Abd: soft,  non-distended Ext: no edema Psych: pleasant, normal affect Skin: Contact dermatitis at ankles. Scattered ecchymosis throughout bilateral lower and upper extremities  Psych: Normal mood.  Normal behavior. Musc: No edema in extremities.  No tenderness in extremities. Neuro: Alert Motor: 4/5 throughout (?left slightly weaker)  Assessment/Plan: 1. Functional deficits which require 3+ hours per day of interdisciplinary therapy in a comprehensive inpatient rehab setting.  Physiatrist is providing close team supervision and 24 hour management of active medical problems listed below.  Physiatrist and rehab team continue to assess barriers to discharge/monitor patient progress toward functional and medical goals  Care Tool:  Bathing    Body parts bathed by patient: Right arm,Left arm,Chest,Abdomen,Front perineal area,Right upper leg,Left upper leg,Face,Right lower leg,Left lower leg   Body parts bathed by helper: Buttocks,Right lower leg,Left lower leg     Bathing assist Assist Level: Minimal Assistance - Patient > 75%     Upper Body Dressing/Undressing Upper body dressing   What is the patient wearing?: Pull over shirt    Upper body assist Assist Level: Minimal Assistance - Patient > 75%    Lower Body Dressing/Undressing Lower body dressing      What is the patient wearing?: Pants,Incontinence brief     Lower body assist Assist for lower body dressing: Minimal Assistance - Patient > 75%     Toileting Toileting    Toileting assist Assist for toileting: Moderate Assistance - Patient 50 - 74%     Transfers Chair/bed transfer  Transfers assist     Chair/bed transfer assist level: Minimal Assistance - Patient > 75% Chair/bed transfer assistive device: Licensed conveyancer   Ambulation assist  Assist level: Minimal Assistance - Patient > 75% Assistive device: Walker-rolling Max distance: 43ft   Walk 10 feet activity   Assist      Assist level: Minimal Assistance - Patient > 75% Assistive device: Walker-rolling   Walk 50 feet activity   Assist Walk 50 feet with 2 turns activity did not occur: Safety/medical concerns         Walk 150 feet activity   Assist Walk 150 feet activity did not occur: N/A (pt does not ambulate this distance at basline)         Walk 10 feet on uneven surface  activity   Assist Walk 10 feet on uneven surfaces activity did not occur: Safety/medical concerns         Wheelchair     Assist Will patient use wheelchair at discharge?: No             Wheelchair 50 feet with 2 turns activity    Assist            Wheelchair 150 feet activity     Assist          Blood pressure 96/65, pulse 95, temperature 98.9 F (37.2 C), temperature source Oral, resp. rate 16, height 5' (1.524 m), weight 56.6 kg, SpO2 99 %.   Medical Problem List and Plan: 1.  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  Antithrombotics: -DVT/anticoagulation: Eliquis 5 mg twice daily, d/ced due to GI bleed and increasing hematoma, Neuro aware.    Lower extremity Dopplers negative             -antiplatelet therapy: Aspirin 81 mg daily 3. Pain Management: Continue Hydrocodone as needed             2/7: does have some pain in rash in lower extremities. She has been using hydrocodone twice per day- says she uses it once per day at home. Says she uses it for diffuse arthitic pains. She has not tried voltaren gel but is willing to.   2/8: diclofenac gel helped, continue  2/9: has not required Norco in 2 days! Continue diclofenac gel   Monitor with increased exertion 4. Mood: Cymbalta 60 mg daily, Wellbutrin 150 mg daily, Ativan 0.5 mg every 8 hours as needed             -antipsychotic agents: N/A 5. Neuropsych: This patient is capable of making decisions on her own behalf. 6. Skin/Wound Care: Routine  skin checks 7. Fluids/Electrolytes/Nutrition: Routine in and outs.   8.  History of PAF/CAD/PFO.  30-day cardiac event monitor per cardiology services Dr.Kardie Tobb.  Continue Eliquis for now.  If EF improves and no atrial fibrillation in 3 months plan to stop Eliquis and restart Plavix.             Monitor with increased activity 9.  Transaminitis.  Follow-up gastroenterology service Dr. Marina Goodell.               LFTs elevated on 1/31, AST and ALT trending downward on 2/7, repeat CMP ordered for 2/14.  10.  Hyperlipidemia.  Crestor on hold due to elevated LFTs. 11.  Rheumatoid arthritis: Low-dose prednisone as well as weekly Orencia (was on Ansid at home flurbiprofen)             Having rheumatoid flare  20mg  prednisone x 2d,  reduce to 10mg  on 2/3 and 5mg  on 2/61may have caused gastritis 12.  Diabetes  mellitus.  Hemoglobin A1c 6.1.  SSI.            CBG (last 3)  Recent Labs    05/07/20 2132 05/08/20 0601 05/08/20 0945  GLUCAP 136* 106* 149*   2/8: CBGs 118-159: increase metformin with lunch to 500mg . Will maintain Toprol since still tachy.   2/9: CBGs better controlled.  13.  Hypothyroidism: Synthroid 14.  Essential hypertension.. Blood pressure had remained a bit soft Toprol-XL resumed at 12.5 mg daily 04/26/2020   Vitals:   05/08/20 0634 05/08/20 0842  BP: 108/77 96/65  Pulse: 69 95  Resp: 18 16  Temp: 98.6 F (37 C) 98.9 F (37.2 C)  SpO2: 92% 99%   Soft, but stable on 2/8-2/9: continue Toprol as HR still increases at times.  15.  Constipation but multiple stools post sorbitol 1/29 16. Sleep disturbance: melatonin 3mg  started HS  Improving 17.  GI bleed with drop in Hgb, suspect lower GI bleed given bright red blood  Hb 10.1 on 2/5, cont to monitor 18. Tachycardia: continue Toprol.  19. Confusion: Patient says she slept poorly last night. Head CT was done yesterday and showed no acute abnormality.   LOS: 12 days A FACE TO FACE EVALUATION WAS PERFORMED  Clint Bolder P  Terrian Sentell 05/08/2020, 10:21 AM

## 2020-05-08 NOTE — Plan of Care (Signed)
  Problem: Consults Goal: RH STROKE PATIENT EDUCATION Description: See Patient Education module for education specifics  Outcome: Progressing Goal: Nutrition Consult-if indicated Outcome: Progressing Goal: Diabetes Guidelines if Diabetic/Glucose > 140 Description: If diabetic or lab glucose is > 140 mg/dl - Initiate Diabetes/Hyperglycemia Guidelines & Document Interventions  Outcome: Progressing   Problem: RH BOWEL ELIMINATION Goal: RH STG MANAGE BOWEL WITH ASSISTANCE Description: STG Manage Bowel with supervision. Outcome: Progressing Goal: RH STG MANAGE BOWEL W/MEDICATION W/ASSISTANCE Description: STG Manage Bowel with Medication with mod I Outcome: Progressing   Problem: RH BLADDER ELIMINATION Goal: RH STG MANAGE BLADDER WITH ASSISTANCE Description: STG Manage Bladder With min Assistance Outcome: Progressing   Problem: RH SKIN INTEGRITY Goal: RH STG SKIN FREE OF INFECTION/BREAKDOWN Description: Min assist Outcome: Progressing Goal: RH STG MAINTAIN SKIN INTEGRITY WITH ASSISTANCE Description: STG Maintain Skin Integrity With min Assistance. Outcome: Progressing   Problem: RH SAFETY Goal: RH STG ADHERE TO SAFETY PRECAUTIONS W/ASSISTANCE/DEVICE Description: STG Adhere to Safety Precautions With min Assistance/Device. Outcome: Progressing Goal: RH STG DECREASED RISK OF FALL WITH ASSISTANCE Description: STG Decreased Risk of Fall With min Assistance. Outcome: Progressing   Problem: RH COGNITION-NURSING Goal: RH STG USES MEMORY AIDS/STRATEGIES W/ASSIST TO PROBLEM SOLVE Description: STG Uses Memory Aids/Strategies With supervision Assistance to Problem Solve. Outcome: Progressing Goal: RH STG ANTICIPATES NEEDS/CALLS FOR ASSIST W/ASSIST/CUES Description: STG Anticipates Needs/Calls for Assist With Assistance/Cues. Outcome: Progressing   Problem: RH PAIN MANAGEMENT Goal: RH STG PAIN MANAGED AT OR BELOW PT'S PAIN GOAL Description: Pain less than or equal to 2. Outcome:  Progressing   Problem: RH KNOWLEDGE DEFICIT Goal: RH STG INCREASE KNOWLEDGE OF DIABETES Description: Patient and spouse will be able to manage DM with medications and dietary modifications using handouts and educational materials independently Outcome: Progressing Goal: RH STG INCREASE KNOWLEDGE OF HYPERTENSION Description: Patient and spouse will be able to manage HTN with medications and dietary modifications using handouts and educational materials independently Outcome: Progressing Goal: RH STG INCREASE KNOWLEGDE OF HYPERLIPIDEMIA Description: Patient and spouse will be able to manage HLD with medications and dietary modifications using handouts and educational materials independently Outcome: Progressing Goal: RH STG INCREASE KNOWLEDGE OF STROKE PROPHYLAXIS Outcome: Progressing   Problem: RH Pre-functional/Other (Specify) Goal: RH LTG Pre-functional (Specify) Outcome: Progressing Goal: RH LTG Interdisciplinary (Specify) 1 Description: RH LTG Interdisciplinary (Specify)1 Outcome: Progressing Goal: RH LTG Interdisciplinary (Specify) 2 Description: RH LTG Interdisciplinary (Specify) 2  Outcome: Progressing

## 2020-05-08 NOTE — Patient Care Conference (Signed)
Inpatient RehabilitationTeam Conference and Plan of Care Update Date: 05/08/2020   Time: 10:38 AM    Patient Name: Madison Hurst      Medical Record Number: [id number]  Date of Birth: 1960/07/14 Sex: Female         Room/Bed: 4W03C/4W03C-01 Payor Info: Payor: [employer] MEDICARE / Plan: [employer] MEDICARE CHOICE PPO / Product Type: *No Product type* /    Admit Date/Time:  04/26/2020  6:29 PM  Primary Diagnosis:  Right middle cerebral artery stroke [facility])  Hospital Problems: Principal Problem:   Right middle cerebral artery stroke (HCC) Active Problems:   Depression with anxiety   Acute GI bleeding   Sleep disturbance   Labile blood glucose   Benign essential HTN   Hypoglycemia   Diabetes mellitus type 2 in nonobese [facility])    Expected Discharge Date: Expected Discharge Date: 05/14/20  Team Members Present: Physician leading conference: Dr. Sula Soda Care Coodinator Present: Chana Bode, RN, BSN, CRRN Nurse Present: Other (comment) Freddrick March, RN) PT Present: Casimiro Needle, PT OT Present: Perrin Maltese, OT SLP Present: Suzzette Righter, CF-SLP PPS Coordinator present : Fae Pippin, SLP     Current Status/Progress Goal Weekly Team Focus  Bowel/Bladder   Usually continent. Incont x1 of bladder and x1 of bowel.  Continence  Assess for toileting needs every round.   Swallow/Nutrition/ Hydration             ADL's   Supervision for UB bathing with min assist for UB dressing.  Min assist for LB bathing and and mod assist for LB dressing sit to stand.  Min assist for toilet transfers with mod assist for clothing and hygiene.  Still with increased confusion and decreased memory.  Will work on making night splints for hand positioning  contact guard to min assist  selfcare retraining, transfer training, AE/DME education, therapeutic exercise, neuromuscular re-education, pt/family education   Mobility   min assist bed mobility, min/mod assist sit<>stand and stand pivot  transfers using RW as pt continues with posterior lean/LOB with poor balance recovery strategies, min assist gait up to 104ft using RW  CGA overall at ambulatory level  activity tolerance, bed mobility, transfer training, gait training, static and dynamic standing balance, pt education   Communication             Safety/Cognition/ Behavioral Observations  Min-Mod  Supervision  semi-complex to complex problem solving, error awareness, memory strategies, education   Pain   Pain at 3 to 8 out of 10 in left foot and back. Norco and Voltaren effective.  Equal to or <3.  Assess q shift and PRN   Skin   No skignificant skin issues.  Skin will remain intact.  Assess q shift.     Discharge Planning:  Discharging home with spouse. 1 level home, 1 step to enter   Team Discussion: Progress impaired by insomnia/fatigue and fluctuating cognition; sleep chart ordered and MD adjusted sleep aide, discontinued ativan. CT scan negative for abnormal findings. LFTs trending down and BP continues to be low. Changed to abdominal binder due to skin irritation with compression wraps.  Patient on target to meet rehab goals: no, little progress noted overall. Currently min-mod with OT and mod assist for gait. SLP working on poor working memory  *See [employer] and progress notes for long and short-term goals.   Revisions to Treatment Plan:  SLP downgraded goals Teaching Needs: Safety, assistance, toileting, transfers, medications, etc.  Current Barriers to Discharge: Decreased caregiver support and  Home enviroment access/layout Spouse has had previous stroke; limited support  Possible Resolutions to Barriers: Family education scheduled for 05/10/20 with spouse     Medical Summary Current Status: Confused this morning as per PT-CT yesterday shows no acute abnormalities, has not required Norco in 2 days- pain has improved with diclofenac gel    Barriers to Discharge Comments: Confusion (fluctating), insomnia,  GI bleed/hematoma, elevated LFTs, orthostasis Possible Resolutions to [employer] Focus: increase melatonin to 5mg , continue diclofenac gel/resting hand splints to be fabricated by OT, Eliquis has been d/ced, LFTs are decreasing, continue abdominal binder   Continued Need for Acute Rehabilitation Level of Care: The patient requires daily medical management by a physician with specialized training in physical medicine and rehabilitation for the following reasons: Direction of a multidisciplinary physical rehabilitation program to maximize functional independence : Yes Medical management of patient stability for increased activity during participation in an intensive rehabilitation regime.: Yes Analysis of laboratory values and/or radiology reports with any subsequent need for medication adjustment and/or medical intervention. : Yes   I attest that I was present, lead the team conference, and concur with the assessment and plan of the team.   B 05/08/2020, 1:18 PM

## 2020-05-08 NOTE — Progress Notes (Signed)
Patient ID: Madison Hurst, female   DOB: 05-16-1960, 60 y.o.   MRN: [id number]   SW called and attempted to schedule family education, no answer. Left VM.  [place], [place] [phone]

## 2020-05-08 NOTE — Progress Notes (Signed)
Completed Post Fall Flowsheet Note:   05/07/20 1900  What Happened  Was fall witnessed? No  Was patient injured? Unsure  Patient found on floor  Found by Staff-comment Freddrick March, RN notified by NT)  Stated prior activity bathroom-unassisted  Follow Up  MD notified Marissa Nestle, PA-C  Time MD notified [phone]  Family notified Yes - comment  Time family notified 1830  Additional tests Yes-comment (STAT CT of Head)  Progress note created (see row info) Yes  Adult Fall Risk Assessment  Risk Factor Category (scoring not indicated) Fall has occurred during this admission (document High fall risk)  Patient Fall Risk Level High fall risk  Adult Fall Risk Interventions  Required Bundle Interventions *See Row Information* High fall risk - low, moderate, and high requirements implemented  Additional Interventions Family Supervision;Lap belt while in chair/wheelchair;Reorient/diversional activities with confused patients;Use of appropriate toileting equipment (bedpan, BSC, etc.)  Screening for Fall Injury Risk (To be completed on HIGH fall risk patients) - Assessing Need for Low Bed  Risk For Fall Injury- Low Bed Criteria Admitted as a result of a fall  Will Implement Low Bed and Floor Mats Low bed contraindicated, floor mats in place  Specialty Low Bed Contraindicated Hemodynamically unstable  Screening for Fall Injury Risk (To be completed on HIGH fall risk patients who do not meet crieteria for Low Bed) - Assessing Need for Floor Mats Only  Risk For Fall Injury- Criteria for Floor Mats Noncompliant with safety precautions  Will Implement Floor Mats Yes  Vitals  Temp 98.7 F (37.1 C)  Temp Source Oral  BP 104/65  BP Location Right Arm  BP Method Automatic  Patient Position (if appropriate) Lying  Pulse Rate 78  Pulse Rate Source Dinamap  Resp 19  Oxygen Therapy  SpO2 95 %  O2 Device Room Air  Pain Assessment  Pain Scale 0-10  Pain Score 0  PCA/Epidural/Spinal Assessment   Respiratory Pattern Regular;Unlabored  Neurological  Neuro (WDL) WDL  Level of Consciousness Alert  Orientation Level Oriented to person;Oriented to place;Oriented to situation  Cognition Appropriate at baseline;Follows commands;Memory impairment;Poor safety awareness;Impulsive  Speech Clear  Pupil Assessment  Yes  R Pupil Size (mm) 2  R Pupil Shape Round  R Pupil Reaction Brisk  L Pupil Size (mm) 2  L Pupil Shape Round  L Pupil Reaction Brisk  Motor Function/Sensation Assessment Grip;Sensation;Motor response;Leg ataxia;Dorsiflexion;Motor strength  Facial Symmetry Symmetrical  R Hand Grip Present;Moderate  L Hand Grip Present;Moderate   L Foot Dorsiflexion Present;Moderate  R Foot Plantar Flexion Present;Moderate  L Foot Plantar Flexion Present;Moderate  R Heel to [employer] (Point [employer]) Smooth  L Heel to [employer] (Point to [employer]) Smooth  RUE Motor Response Purposeful movement  RUE Sensation Full sensation  RUE Motor Strength 5  LUE Motor Response Purposeful movement  LUE Sensation Full sensation  LUE Motor Strength 5  RLE Motor Response Purposeful movement  RLE Sensation Full sensation  RLE Motor Strength 4  LLE Motor Response Purposeful movement  LLE Sensation Full sensation  LLE Motor Strength 4  Musculoskeletal  Musculoskeletal (WDL) X  Assistive Device None  Generalized Weakness Yes  Weight Bearing Restrictions No  Musculoskeletal Details  RUE Full movement  LUE Full movement  RLE Full movement;Weakness  LLE Full movement;Weakness  Integumentary  Integumentary (WDL) X  Skin Color Appropriate for ethnicity  Skin Condition Dry  Skin Integrity Intact  Abrasion Location Arm  Abrasion Location Orientation Bilateral  Abrasion Intervention Other (Comment) (assessed)  Skin  Turgor Non-tenting

## 2020-05-08 NOTE — Progress Notes (Signed)
Pt husband visiting came to nurse with concerns about pt fall yesterday and orientation. Nurse explained all details known about fall and explained the safety protocol/equipment implemented to prevent future incidents. Nurse also explained possible causes of current orientation level. Husband and patient verbalized understanding.

## 2020-05-08 NOTE — Progress Notes (Signed)
Urine sample collected per order for UA/C&S d/t pt increased confusion and change in orientation.

## 2020-05-08 NOTE — Progress Notes (Addendum)
Therapist called nurse to assess pt, who was noted to have decreased orientation. Pt noted to be alert, but oriented 2-3, able to recall name/dob and where she was at. BP 94/73, hr 96, o2 95% on  RA, temp 97.4, BS 149. Pt stated that she just "felt really tired from not sleeping last night". No complaints of pain/CP/SOB. PA Dan notified of pt status, no new orders noted. Pt continues to be on neurochecks/vitals fall protocol.

## 2020-05-08 NOTE — Progress Notes (Signed)
Patient ID: Madison Hurst, female   DOB: 1960/06/08, 60 y.o.   MRN: [id number] Team Conference Report to Patient/Family  Team Conference discussion was reviewed with the patient and caregiver, including goals, any changes in plan of care and target discharge date.  Patient and caregiver express understanding and are in agreement.  The patient has a target discharge date of 05/14/20.  Andria Rhein 05/08/2020, 12:54 PM

## 2020-05-08 NOTE — Progress Notes (Signed)
Speech Language Pathology Daily Session Note  Patient Details  Name: Madison Hurst MRN: [id number] Date of Birth: November 03, 1960  Today's Date: 05/08/2020 SLP Individual Time: 1345-1426 SLP Individual Time Calculation (min): 41 min  Short Term Goals: Week 2: SLP Short Term Goal 1 (Week 2): STGs=LTGs due to ELOS  Skilled Therapeutic Interventions: Pt was seen for skilled ST targeting cognition. Noteworthy increase in confusion noted today (RN and MD aware), including some language of confusion throughout session. Pt disoriented to place and situation, requiring reorientation from SLP. Pt was highly distractible (internally) today, requiring increased Mod A verbal and visual cues for redirection in a quiet environment, as well as working memory within tasks. Discussed safety awareness topics with pt, given her recent fall. Pt with no recall of how to use call bell, despite Max A verbal and visual cues. Therefore, demonstrations provided for pt as well and temporary label affixed next to nurse call button to attempt to increase carryover with use of call bell for help. Pt identified higher value card (from field of 2) during a basic card task with Min A verbal and visual cues for error awareness. However, increased Max A verbal and visual cueing was required for organization, emergent awareness and problem solving when sequencing cards a set of cards from #2 through Ace. Memory notebook updated for pt at end of session. Pt left laying in bed with alarm set and needs within reach. Continue per current plan of care.          Pain Pain Assessment Pain Scale: Faces Pain Score: 0-No pain Faces Pain Scale: Hurts a little bit Pain Location: Generalized Pain Intervention(s): Repositioned Multiple Pain Sites: No  Therapy/Group: Individual Therapy  [name] 05/08/2020, 7:24 AM

## 2020-05-08 NOTE — Progress Notes (Signed)
Occupational Therapy Session Note  Patient Details  Name: Madison Hurst MRN: [id number] Date of Birth: 10-Jun-1960  Today's Date: 05/08/2020 OT Individual Time: 1102-1200 OT Individual Time Calculation (min): 58 min    Short Term Goals: Week 2:  OT Short Term Goal 1 (Week 2): Pt will complete LB dressing with min assist for donning brief and pants only sit to stand. OT Short Term Goal 2 (Week 2): Pt will complete UB dressing with min assist to donn pullover shirt. OT Short Term Goal 3 (Week 2): Pt will complete toileting tasks with min assist sit to stand.  Skilled Therapeutic Interventions/Progress Updates:    Session 1: (1102-1200)  Pt sitting in wheelchair at start of session with her spouse present.  Noted increased sweating as well as pt demonstrating confusion with eyes closed at times, reaching ito the air.  She was mumbling words as well during session talking about objects not present.  Pt's spouse inquisitive about pt's current level of cognition as well as her fall yesterday.  Discussed with him about her getting ativan last night and that would now be stopped to see if this could be causing the moderate confusion currently being seen.  BP taken in supine with HOB slightly elevated at 109/59.  She was taken to the therapy gym where she stayed in the wheelchair with therapist applying moist heat to the right wrist and hand in preparation for stretching.  While stimulation was active therapist asked her questions and she was still not aware to place or location.  Prior to attempting stretching, therapist was going to have her transfer to the therapy mat.  Mod assist for partial transfer with pt stating "I'm going!" once she stood and started to turn.  When asked of she was having a BM, she stated yes.  Transferred her back to the wheelchair and rolled her back to the room.  Mod assist for transfer to the toilet with max assist for completion of clothing and hygiene.  She then  transferred back to the wheelchair and then to the bed secondary to decreased attention and awareness.  Mod assist for all transfers secondary to moderate posterior lean.  Pt left with spouse in room and NT checking CBG.    Session 2: (9983-3825) Pt in bed to start session restless.  She was not oriented to place or time and when given three choices to choose such as bank, hospital, or home she chose bank.  Re-directed pt as to where she was along with the month.  She was correct when stating day of the week as "Wednesday".  She needed mod assist for transfer to sitting from supine with therapist assisting to donn slip on crocks as she exhibited frequent LOB posteriorly.  Completed transfer to the wheelchair with transfer to the toilet as well with mod assist secondary to pt stating she had to use the bathroom.  Nursing made aware and discussion was made to get a urine sample secondary to continued moderate confusion more than on previous days.  She needed mod assist for clothing management prior to toileting with max assist for hygiene and clothing post toileting.  Mod assist for transfer to the wheelchair with use of the RW and pt with decreased ability to move her feet efficiently, having them cross on one occasion.  She was then taken to the sink where she worked on washing her hands, brushing her teeth and combing her hair.  Supervision with mod instructional cueing was needed for  initiation of tasks.  Pt's daughter called as well and had pt work on calling her back.  Pt with increased confusion during short conversation stating to her daughter that we were "playing a game".  Re-directed pt that we were just finished using the bathroom and washing your hands at the sink.  Let daughter know that pt has been more confused today and I wanted to see how she did with the phone call.  After finishing this, she was transported over to the bed where she completed transfer stand pivot with mod assist and min assist  for transition to supine.  She was left with the call button and phone in reach and bed alarm in place.  Pt was only able to push the call button 1/2 attempts when instructed to push the button for practice.  The other attempt she just pressed on her shirt secondary to confusion.  Will need close monitoring for safety.    Therapy Documentation Precautions:  Precautions Precautions: Fall Precaution Comments: EF 20-25%, L hemiparesis, RA (hands, shoulders, and ankles) Restrictions Weight Bearing Restrictions: No   Vital Signs: Therapy Vitals Temp: 98.9 F (37.2 C) Temp Source: Oral Pulse Rate: (!) 59 Resp: 15 BP: (!) 109/59 Patient Position (if appropriate): Lying Oxygen Therapy SpO2: (!) 82 % O2 Device: Room Air Pain: Pain Assessment Pain Scale: Faces Pain Score: 0-No pain Pain Location: Hand Pain Orientation: Right;Left Pain Intervention(s): Medication (See eMAR) ADL: See Care Tool Section for some details of mobility and selfcare  Therapy/Group: Individual Therapy  Willodean Leven OTR/L 05/08/2020, 12:12 PM

## 2020-05-08 NOTE — Progress Notes (Signed)
Patient ID: Madison Hurst, female   DOB: Jul 22, 1960, 60 y.o.   MRN: [id number]   Family education scheduled Friday, 2/11 1-3 PM  Lavera Guise, [place] [phone]

## 2020-05-09 LAB — GLUCOSE, CAPILLARY
Glucose-Capillary: 87 mg/dL (ref 70–99)
Glucose-Capillary: 153 mg/dL — ABNORMAL HIGH (ref 70–99)
Glucose-Capillary: 144 mg/dL — ABNORMAL HIGH (ref 70–99)
Glucose-Capillary: 124 mg/dL — ABNORMAL HIGH (ref 70–99)

## 2020-05-09 LAB — URINE CULTURE: Culture: NO GROWTH

## 2020-05-09 MED ORDER — CEPHALEXIN 250 MG PO CAPS
500.0000 mg | ORAL_CAPSULE | Freq: Two times a day (BID) | ORAL | Status: AC
  Administered 2020-05-09 – 2020-05-11 (×6): 500 mg via ORAL
  Filled 2020-05-09 (×6): qty 2

## 2020-05-09 NOTE — Progress Notes (Signed)
Occupational Therapy Session Note  Patient Details  Name: Madison Hurst MRN: [id number] Date of Birth: September 09, 1960  Today's Date: 05/09/2020 OT Individual Time: 1300-1431 OT Individual Time Calculation (min): 91 min    Short Term Goals: Week 2:  OT Short Term Goal 1 (Week 2): Pt will complete LB dressing with min assist for donning brief and pants only sit to stand. OT Short Term Goal 2 (Week 2): Pt will complete UB dressing with min assist to donn pullover shirt. OT Short Term Goal 3 (Week 2): Pt will complete toileting tasks with min assist sit to stand.  Skilled Therapeutic Interventions/Progress Updates:    Session 1: (1300-1431)  Pt sitting up in the wheelchair to start session.  She was not oriented to place initially but when given choices, she was able to state [facility].  She was oriented to month and the day of the week, but could not state reason for hospitalization. Therapist took her down to the therapy gym for work on fabrication of bilateral resting hand splints to wear at night secondary to rheumatoid arthritis.  She maintained sitting in the wheelchair throughout with eyes closed at times and occasional jerking noted.  Once session was finished, she was returned to the room via wheelchair and then transferred back to the bed with mod assist.  Call button and phone in reach with bed alarm in place.  She was able to show therapist which button to push in order to get help if needed.    Session 2: [phone])  Pt in bed during session with continued work on fabrication of bilateral resting hand splints.  She maintained eyes closed unless verbally cued to assist with positioning of her hand in the splint for fabrication.  Finished session with PT in to take pt for next session.    Therapy Documentation Precautions:  Precautions Precautions: Fall Precaution Comments: EF 20-25%, L hemiparesis, RA (hands, shoulders, and ankles) Restrictions Weight Bearing  Restrictions: No   Pain: Pain Assessment Pain Scale: Faces Pain Score: 0-No pain ADL:   Therapy/Group: Individual Therapy  Rigo Letts OTR/L 05/09/2020, 5:01 PM

## 2020-05-09 NOTE — Progress Notes (Signed)
Physical Therapy Weekly Progress Note  Patient Details  Name: Madison Hurst MRN: [id number] Date of Birth: [birth year]  Beginning of progress report period: May 02, 2020 End of progress report period: May 09, 2020  Today's Date: 05/09/2020 PT Individual Time: 0910-1009 PT Individual Time Calculation (min): 59 min   Patient has met 0 of 5 short term goals.  Ms. Nissen is progressing slower than anticipated with therapy due to recent medical complications and overall fatigue. She is performing supine<>sit with min assist, sit<>stand and stand pivot transfers using RW with min/mod assist depending on fatigue level, and ambulating up to 92f using RW with min/mod assist. Pt continues to demonstrate posterior LOB bias with impaired balance recovery strategies requiring strong mod assist to maintain upright. She has not be able to participate in stair navigation training this week. Plan for custom wheelchair consultation to increase pt independence with functional mobility in the home.  Patient continues to demonstrate the following deficits muscle weakness and muscle joint tightness, decreased cardiorespiratoy endurance, impaired timing and sequencing, unbalanced muscle activation, decreased coordination and decreased motor planning, decreased midline orientation, decreased attention, decreased awareness, decreased problem solving, decreased safety awareness, decreased memory and delayed processing and decreased sitting balance, decreased standing balance, decreased postural control and decreased balance strategies and therefore will continue to benefit from skilled PT intervention to increase functional independence with mobility.  Patient not progressing toward long term goals.  See goal revision..  Continue plan of care.  PT Short Term Goals Week 2:  PT Short Term Goal 1 (Week 2): Pt will perform supine<>sit with CGA PT Short Term Goal 1 - Progress (Week 2): Progressing  toward goal PT Short Term Goal 2 (Week 2): Pt will consistently perform sit<>stands using LRAD with min assist PT Short Term Goal 2 - Progress (Week 2): Progressing toward goal PT Short Term Goal 3 (Week 2): Pt will consistently perform bed<>chair transfers using LRAD with min assist PT Short Term Goal 3 - Progress (Week 2): Progressing toward goal PT Short Term Goal 4 (Week 2): Pt will ambulate at least 312fusing LRAD with min assist PT Short Term Goal 4 - Progress (Week 2): Progressing toward goal PT Short Term Goal 5 (Week 2): Pt will ascend/descend 2 (6" height) steps with min assist PT Short Term Goal 5 - Progress (Week 2): Progressing toward goal Week 3:  PT Short Term Goal 1 (Week 3): = to LTGs based on ELOS  Skilled Therapeutic Interventions/Progress Updates:  Ambulation/gait training;Community reintegration;DME/adaptive equipment instruction;Neuromuscular re-education;Psychosocial support;Stair training;UE/LE Strength taining/ROM;Balance/vestibular training;Discharge planning;Functional electrical stimulation;Pain management;Skin care/wound management;Therapeutic Activities;UE/LE Coordination activities;Cognitive remediation/compensation;Disease management/prevention;Functional mobility training;Patient/family education;Visual/perceptual remediation/compensation;Therapeutic Exercise;Splinting/orthotics;Wheelchair propulsion/positioning   Pt received supine in bed asleep, but easily awakens to auditory stimulus and is agreeable to therapy session. Pt states "it looks like I over slept again" - able to locate clock and identify time correctly without cuing. Throughout session, pt remains lethargic often starting to dowse off in w/c during transport to/from therapy gym. Supine>sitting L EOB, HOB partially elevated, min assist for trunk upright. Able to scoot to EOB with CGA for safety demoing poor motor planning and decreased effeciency. Donned socks and slip on shoes max assist - pt with  posterior trunk lean during this but able to recover with close supervision. Sit>stand EOB>RW min assist for lifting and balance - demos decreased posterior lean. Gait training ~1557f2 to/from bathroom using RW with min and a few instances of mod assist due to posterior LOB - therapist  also intermittently assisting with forward movement of AD to facilitate anterior weight shifting. Standing with UE support on RW or grab bar and min/mod assist for balance due to posterior lean during total assist LB clothing management. Pt unable to urinate but states doesn't feel urge - seated peri-care with supervision for sitting balance. Standing at sink CGA/min assist for balance during hand hygiene. Discussed plan for family education/training tomorrow in preparation for upcoming D/C and pt able to recall this. Discussed her CLOF and her husband's limited ability to provide physical assistance with plan for custom wheelchair consultation to increase her independence with functional mobility in the home. Transported to/from gym in w/c for time management and energy conservation. Sit>stands w/c>RW with min assist for balance throughout session and continued cuing for anterior weight shift upon coming to stand. Gait training ~6f using RW with min assist for balance ambulating forward but requires mod assist when turning and especially when turning to sit in w/c due to repeated posterior LOB with each step. Pt reports she feels that her shoes make it more difficult to pick up her feet therefore wore only gripper socks remainder of session. Participated in Timed Up and Go (TUG): [id number] using RW requiring min assist for balance - demonstrates high fall risk as indicated by requiring significantly >13.5seconds to complete the TUG. Therapist swapped out pt's w/c for an ultra-hemi-height in an attempt to have a floor-to-seat height where pt could attempt B LE propulsion; however, due to the cushion height pt still unable to reach  floor fully. Will benefit from continued attempts at this to determine if lightweight manual wheelchair is a feasible option for functional mobility at D/C and would benefit from trial at power wheelchair to determine if she could safely maneuver that in home setting due to progressive RA. Transported back to room and agreeable to remain sitting in w/c - left with needs in reach, seat belt alarm on, and feet propped up on step.    Therapy Documentation Precautions:  Precautions Precautions: Fall Precaution Comments: EF 20-25%, L hemiparesis, RA (hands, shoulders, and ankles) Restrictions Weight Bearing Restrictions: No  Pain:   No reports of pain throughout session.  Therapy/Group: Individual Therapy  CaTawana Scale PT, DPT, CSRS  05/09/2020, 7:53 AM

## 2020-05-09 NOTE — Progress Notes (Signed)
Patient ID: Madison Hurst, female   DOB: 03/19/1961, 60 y.o.   MRN: [id number]   [facility] agencies present to patient and spouse.   Tony, [place] [phone]

## 2020-05-09 NOTE — Progress Notes (Signed)
Physical Therapy Session Note  Patient Details  Name: Madison Hurst MRN: [id number] Date of Birth: 01/29/1961  Today's Date: 05/09/2020 PT Individual Time: 1630-1714 PT Individual Time Calculation (min): 44 min   Short Term Goals: Week 2:  PT Short Term Goal 1 (Week 2): Pt will perform supine<>sit with CGA PT Short Term Goal 2 (Week 2): Pt will consistently perform sit<>stands using LRAD with min assist PT Short Term Goal 3 (Week 2): Pt will consistently perform bed<>chair transfers using LRAD with min assist PT Short Term Goal 4 (Week 2): Pt will ambulate at least 30ft using LRAD with min assist PT Short Term Goal 5 (Week 2): Pt will ascend/descend 2 (6" height) steps with min assist  Skilled Therapeutic Interventions/Progress Updates:   Pt received supine in bed and agreeable to PT. Supine>sit transfer with min assist and  Cues for sequencing. Stand pivot transfer to WC with min assist and RW. Pt transported to rehab gym in WC. Gait training with RW 2 x 25ft with min assist throughout. Multiple mild LOB due to LLE foot drag and poor motor planning with turn requiring mod cues for LE positioning and proper weight shifting. Pt reports need to urinate. Stand pivot transfer to toilet with CGA and RW. Pt able to perform peri care. Clothing management performed by PT while pt maintained UE support on RW. Stand pivot tranfers to and from WC with min assist and min cues for gait pattern to maintain wide BOS. Sit>supine completed with supervision assist. Scooting to HOB with min assist and cues for LE positioning. Pt left supine in bed with call bell in reach and all needs met.         Therapy Documentation Precautions:  Precautions Precautions: Fall Precaution Comments: EF 20-25%, L hemiparesis, RA (hands, shoulders, and ankles) Restrictions Weight Bearing Restrictions: No    Pain: Pain Assessment Pain Scale: Faces Pain Score: 0-No pain    Therapy/Group: Individual  Therapy  Austin E Tucker 05/09/2020, 5:40 PM  

## 2020-05-09 NOTE — Plan of Care (Signed)
  Problem: RH Car Transfers Goal: LTG Patient will perform car transfers with assist (PT) Description: LTG: Patient will perform car transfers with assistance (PT). Flowsheets (Taken 05/09/2020 1920) LTG: Pt will perform car transfers with assist:: (downgraded based on pt progress) Minimal Assistance - Patient > 75% Note: downgraded based on pt progress   Problem: RH Ambulation Goal: LTG Patient will ambulate in controlled environment (PT) Description: LTG: Patient will ambulate in a controlled environment, # of feet with assistance (PT). Flowsheets (Taken 05/09/2020 1920) LTG: Pt will ambulate in controlled environ  assist needed:: (downgraded based on pt progress) Contact Guard/Touching assist LTG: Ambulation distance in controlled environment: 53ft using LRAD Note: downgraded based on pt progress Goal: LTG Patient will ambulate in home environment (PT) Description: LTG: Patient will ambulate in home environment, # of feet with assistance (PT). Flowsheets (Taken 05/09/2020 1920) LTG: Pt will ambulate in home environ  assist needed:: (downgraded based on pt progress) Contact Guard/Touching assist LTG: Ambulation distance in home environment: 65ft using LRAD Note: downgraded based on pt progress   Problem: RH Wheelchair Mobility Goal: LTG Patient will propel w/c in controlled environment (PT) Description: LTG: Patient will propel wheelchair in controlled environment, # of feet with assist (PT) Flowsheets (Taken 05/09/2020 1920) LTG: Pt will propel w/c in controlled environ  assist needed:: (added based on pt progress with limited ambulation) Supervision/Verbal cueing LTG: Propel w/c distance in controlled environment: 95ft Note: added based on pt progress with limited ambulation Goal: LTG Patient will propel w/c in home environment (PT) Description: LTG: Patient will propel wheelchair in home environment, # of feet with assistance (PT). Flowsheets (Taken 05/09/2020 1920) LTG: Pt will propel  w/c in home environ  assist needed:: (added based on pt progress with limited ambulation) Supervision/Verbal cueing LTG: Propel w/c distance in home environment: 45ft Note: added based on pt progress with limited ambulation   Problem: RH Stairs Goal: LTG Patient will ambulate up and down stairs w/assist (PT) Description: LTG: Patient will ambulate up and down # of stairs with assistance (PT) Flowsheets (Taken 05/09/2020 1922) LTG: Pt will ambulate up/down stairs assist needed:: (downgraded based on pt progress) Minimal Assistance - Patient > 75% LTG: Pt will  ambulate up and down number of stairs: 2 steps using HR support per home set-up Note: downgraded based on pt progress   Problem: RH Bed Mobility Goal: LTG Patient will perform bed mobility with assist (PT) Description: LTG: Patient will perform bed mobility with assistance, with/without cues (PT). Flowsheets (Taken 05/09/2020 1920) LTG: Pt will perform bed mobility with assistance level of: (downgraded based on pt progress) Contact Guard/Touching assist Note: downgraded based on pt progress

## 2020-05-09 NOTE — Progress Notes (Signed)
Nutrition Follow-up  DOCUMENTATION CODES:   Not applicable  INTERVENTION:   -Continue Ensure Enlive BID, each supplement provides 350 kcal and 20 grams of protein  -Continue MVI with minerals daily  -Encourage PO intake   -Re-weigh pt.    NUTRITION DIAGNOSIS:   Increased nutrient needs related to other (see comment) (extensive therapies) as evidenced by estimated needs.  Ongoing.  GOAL:   Patient will meet greater than or equal to 90% of their needs  Ongoing.   MONITOR:   PO intake,Supplement acceptance,Weight trends,Labs,I & O's  REASON FOR ASSESSMENT:   Malnutrition Screening Tool    ASSESSMENT:   Patient with PMH significant for substernal goiter, CAD, HTN, HLD, and DM. 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  Notes indicate an inconsistent percentage of meals eaten since 02/07, 0-80% (averaging 35%). Per RDs notes from 02/03, pt states she's not a "big eater" from baseline. Pt discussed with intern that she has been eating approximately 25% of her meals. Pt did mention that she had eaten 50% of her breakfast that consisted of eggs and french toast. Pt did seem confused and mentioned that she couldn't remember if she had eaten  lunch yet or not. Due to pt's confusion, the nutrition history she provided may or may not be accurate.   Pt dicussed with intern that she had not consumed any of the Ensure Enlive that she has been given. However, she did mention that she likes these and would be willing to try them. Intern discussed the benefits that these supplements provide in regards to added protein and calories that will aid in her healing process. Pt was receptive to this information.   Pt denied any nausea, vomiting, diarrhea, constipation or abdominal pain. Pt also denied any chewing or swallowing difficulties.    Pt's weights have remained relatively stable with slight variations. Edema was noted during NFPE, therefore could be masking weight  loss. Request for pt to be reweighed as last weight was 01/28.  I&O Reviewed.   Meds Reviewed: Ensure Enlive BID, Folvite (2 mg, daily), Glucophage (500 mg, BID), MVI with minerals, Deltasone (5 mg, daily)  Labs Reviewed.   NUTRITION - FOCUSED PHYSICAL EXAM:  Flowsheet Row Most Recent Value  Orbital Region No depletion  Upper Arm Region No depletion  Thoracic and Lumbar Region No depletion  Buccal Region No depletion  Temple Region No depletion  Clavicle Bone Region Mild depletion  Clavicle and Acromion Bone Region Mild depletion  Scapular Bone Region No depletion  Dorsal Hand No depletion  Patellar Region Mild depletion  Anterior Thigh Region Mild depletion  Posterior Calf Region Mild depletion  Edema (RD Assessment) Mild  Hair Reviewed  Eyes Reviewed  Mouth Reviewed  Skin Reviewed  Nails Reviewed       Diet Order:   Diet Order            Diet heart healthy/carb modified Room service appropriate? Yes; Fluid consistency: Thin  Diet effective now                 EDUCATION NEEDS:   Education needs have been addressed  Skin:  Skin Assessment: Reviewed RN Assessment  Last BM:  02/09 (type 5)  Height:   Ht Readings from Last 1 Encounters:  04/26/20 5' (1.524 m)    Weight:   Wt Readings from Last 1 Encounters:  04/26/20 56.6 kg    Ideal Body Weight:  47.7 kg  BMI:  Body mass index is  24.37 kg/m.  Estimated Nutritional Needs:   Kcal:  1700-1900 kcal  Protein:  85-100 grams  Fluid:  >/= 1.7 L/day    Madison Hurst, Dietetic Intern 05/09/2020 2:31 PM

## 2020-05-09 NOTE — Progress Notes (Signed)
[facility] PHYSICAL MEDICINE & REHABILITATION PROGRESS NOTE   Subjective/Complaints: Mrs. Trujeque continues to have confusion despite d/c Ativan. UA is positive for leukocytes. I have ordered Keflex.  I have also d/ced Norco as she received that this morning and this can cause confusion.   Review of systems: Denies CP, SOB, N/V/D, +pain in bilateral rash in lower extremities, insomnia, confusion  Objective:   CT HEAD WO CONTRAST  Result Date: 05/07/2020 CLINICAL DATA:  Suspected head injury EXAM: CT HEAD WITHOUT CONTRAST TECHNIQUE: Contiguous axial images were obtained from the base of the skull through the vertex without intravenous contrast. COMPARISON:  04/22/2020 FINDINGS: Brain: There is atrophy and chronic small vessel disease changes. No acute intracranial abnormality. Specifically, no hemorrhage, hydrocephalus, mass lesion, acute infarction, or significant intracranial injury. Vascular: No hyperdense vessel or unexpected calcification. Skull: No acute calvarial abnormality. Sinuses/Orbits: Visualized paranasal sinuses and mastoids clear. Orbital soft tissues unremarkable. Other: None IMPRESSION: Atrophy, chronic microvascular disease. No acute intracranial abnormality. Electronically Signed   By: Charlett Nose M.D.   On: 05/07/2020 20:01   No results for input(s): WBC, HGB, HCT, PLT in the last 72 hours. No results for input(s): NA, K, CL, CO2, GLUCOSE, BUN, CREATININE, CALCIUM in the last 72 hours.  Intake/Output Summary (Last 24 hours) at 05/09/2020 0935 Last data filed at 05/09/2020 0709 Gross per 24 hour  Intake 360 ml  Output --  Net 360 ml        Physical Exam: Vital Signs Blood pressure 96/72, pulse 91, temperature 98.5 F (36.9 C), temperature source Oral, resp. rate 16, height 5' (1.524 m), weight 56.6 kg, SpO2 99 %. Gen: no distress, normal appearing HEENT: oral mucosa pink and moist, NCAT Cardio: Reg rate Chest: normal effort, normal rate of breathing Abd: soft,  non-distended Ext: no edema Psych: pleasant, normal affect Skin: Contact dermatitis at ankles. Scattered ecchymosis throughout bilateral lower and upper extremities  Psych: Normal mood.  Normal behavior. Musc: No edema in extremities.  No tenderness in extremities. Neuro: Alert Motor: 4/5 throughout (?left slightly weaker)  Assessment/Plan: 1. Functional deficits which require 3+ hours per day of interdisciplinary therapy in a comprehensive inpatient rehab setting.  Physiatrist is providing close team supervision and 24 hour management of active medical problems listed below.  Physiatrist and rehab team continue to assess barriers to discharge/monitor patient progress toward functional and medical goals  Care Tool:  Bathing    Body parts bathed by patient: Right arm,Left arm,Chest,Abdomen,Front perineal area,Right upper leg,Left upper leg,Face,Right lower leg,Left lower leg   Body parts bathed by helper: Buttocks,Right lower leg,Left lower leg     Bathing assist Assist Level: Minimal Assistance - Patient > 75%     Upper Body Dressing/Undressing Upper body dressing   What is the patient wearing?: Pull over shirt    Upper body assist Assist Level: Minimal Assistance - Patient > 75%    Lower Body Dressing/Undressing Lower body dressing      What is the patient wearing?: Pants,Incontinence brief     Lower body assist Assist for lower body dressing: Minimal Assistance - Patient > 75%     Toileting Toileting    Toileting assist Assist for toileting: Maximal Assistance - Patient 25 - 49%     Transfers Chair/bed transfer  Transfers assist     Chair/bed transfer assist level: Moderate Assistance - Patient 50 - 74% Chair/bed transfer assistive device: Geologist, engineering   Ambulation assist      Assist level: Moderate  Assistance - Patient 50 - 74% Assistive device: Walker-rolling Max distance: 10ft   Walk 10 feet activity   Assist      Assist level: Moderate Assistance - Patient - 50 - 74% Assistive device: Walker-rolling   Walk 50 feet activity   Assist Walk 50 feet with 2 turns activity did not occur: Safety/medical concerns         Walk 150 feet activity   Assist Walk 150 feet activity did not occur: N/A (pt does not ambulate this distance at basline)         Walk 10 feet on uneven surface  activity   Assist Walk 10 feet on uneven surfaces activity did not occur: Safety/medical concerns         Wheelchair     Assist Will patient use wheelchair at discharge?: No             Wheelchair 50 feet with 2 turns activity    Assist            Wheelchair 150 feet activity     Assist          Blood pressure 96/72, pulse 91, temperature 98.5 F (36.9 C), temperature source Oral, resp. rate 16, height 5' (1.524 m), weight 56.6 kg, SpO2 99 %.   Medical Problem List and Plan: 1.  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  Antithrombotics: -DVT/anticoagulation: Eliquis 5 mg twice daily, d/ced due to GI bleed and increasing hematoma, Neuro aware.    Lower extremity Dopplers negative             -antiplatelet therapy: Aspirin 81 mg daily 3. Pain Management: Continue Hydrocodone as needed             2/7: does have some pain in rash in lower extremities. She has been using hydrocodone twice per day- says she uses it once per day at home. Says she uses it for diffuse arthitic pains. She has not tried voltaren gel but is willing to.   2/8: diclofenac gel helped, continue  2/9: has not required Norco in 2 days! Continue diclofenac gel   2/10: received one Norco this morning and does appear to be very confused- will d/c as Norco can contribute to confusion.   Monitor with increased exertion 4. Mood: Cymbalta 60 mg daily, Wellbutrin 150 mg daily, Ativan 0.5 mg every 8 hours as needed- d/ced as can  contribute to confusion             -antipsychotic agents: N/A 5. Neuropsych: This patient is capable of making decisions on her own behalf. 6. Skin/Wound Care: Routine skin checks 7. Fluids/Electrolytes/Nutrition: Routine in and outs.   8.  History of PAF/CAD/PFO.  30-day cardiac event monitor per cardiology services Dr.Kardie Tobb.  Continue Eliquis for now.  If EF improves and no atrial fibrillation in 3 months plan to stop Eliquis and restart Plavix.             Monitor with increased activity 9.  Transaminitis.  Follow-up gastroenterology service Dr. Marina Goodell.               LFTs elevated on 1/31, AST and ALT trending downward on 2/7, repeat CMP ordered for 2/14.  10.  Hyperlipidemia.  Crestor on hold due to elevated LFTs. 11.  Rheumatoid arthritis: Low-dose prednisone as well as weekly Orencia (was on Ansid at home flurbiprofen)  Having rheumatoid flare  20mg  prednisone x 2d,  reduce to 10mg  on 2/3 and 5mg  on 2/55may have caused gastritis 12.  Diabetes mellitus.  Hemoglobin A1c 6.1.  SSI.            CBG (last 3)  Recent Labs    05/08/20 1620 05/08/20 2111 05/09/20 0603  GLUCAP 144* 140* 87   2/8: CBGs 118-159: increase metformin with lunch to 500mg . Will maintain Toprol since still tachy.   2/9: CBGs better controlled.  13.  Hypothyroidism: Synthroid 14.  Essential hypertension.. Blood pressure had remained a bit soft Toprol-XL resumed at 12.5 mg daily 04/26/2020   Vitals:   05/08/20 1945 05/09/20 0417  BP: 112/88 96/72  Pulse: 99 91  Resp: 16 16  Temp: 99.4 F (37.4 C) 98.5 F (36.9 C)  SpO2: 100% 99%   Soft, but stable on 2/8-2/9: continue Toprol as HR still increases at times.  15.  Constipation but multiple stools post sorbitol 1/29 16. Sleep disturbance: melatonin 3mg  started HS  Improving 17.  GI bleed with drop in Hgb, suspect lower GI bleed given bright red blood  Hb 10.1 on 2/5, cont to monitor 18. Tachycardia: continue Toprol.  19. Confusion:   2/9:  Patient says she slept poorly last night. Head CT was done yesterday and showed no acute abnormality.   2/10: UA+: started Keflex. UC is pending.   LOS: 13 days A FACE TO FACE EVALUATION WAS PERFORMED  01-15-2002 Kevin Mario 05/09/2020, 9:35 AM

## 2020-05-10 ENCOUNTER — Telehealth: Payer: Self-pay | Admitting: Physical Medicine & Rehabilitation

## 2020-05-10 LAB — GLUCOSE, CAPILLARY
Glucose-Capillary: 130 mg/dL — ABNORMAL HIGH (ref 70–99)
Glucose-Capillary: 157 mg/dL — ABNORMAL HIGH (ref 70–99)
Glucose-Capillary: 149 mg/dL — ABNORMAL HIGH (ref 70–99)
Glucose-Capillary: 103 mg/dL — ABNORMAL HIGH (ref 70–99)

## 2020-05-10 MED ORDER — ENSURE MAX PROTEIN PO LIQD
11.0000 [oz_av] | Freq: Two times a day (BID) | ORAL | Status: DC
  Administered 2020-05-10 – 2020-05-16 (×8): 11 [oz_av] via ORAL

## 2020-05-10 NOTE — Progress Notes (Signed)
Physical Therapy Session Note  Patient Details  Name: Madison Hurst MRN: [id number] Date of Birth: [birth year]  Today's Date: 05/10/2020 PT Individual Time: 1420-1502 PT Individual Time Calculation (min): 42 min   Short Term Goals: Week 3:  PT Short Term Goal 1 (Week 3): = to LTGs based on ELOS  Skilled Therapeutic Interventions/Progress Updates:   Pt received sitting in WC and agreeable to PT, upon completion of OT treatment. Husband present for family education.   Pt transported to rehab gym. Stair management training to ascend 1 step with BUE support on 1 rail to simulate home environment of handle beside step. Ascend/descend 1 step with min assist on 6"step. Performed x 2 with PT to 8" step and x 1 with assist from husband. Pt then performed step management with RW and mod assist to ascend 8" curb. Pt reports feeling uncomfortable managing step with RW, and would prefer to use rail on the L. PT educated pt and husband on benefit of having ramp to access house with plans for pt to obtain either ultralight or power WC upon d/c.    car transfer training with RW x 1 with PT and then with husband. Min assist overall with moderate cues for pt and husband for safety to utilize sit>pivot technique. Husband able to provide assist without added assist from PT and able to instruct pt for proper technique to step back to WC.   Gait training in room with RW and min assist provided by husband for safety. Husband provided instruction for safe AD management in transfer to EOB.   Sit>supine without assist from PT. And pt left in bed with call bell in reach with all needs met.      Therapy Documentation Precautions:  Precautions Precautions: Fall Precaution Comments: EF 20-25%, L hemiparesis, RA (hands, shoulders, and ankles) Restrictions Weight Bearing Restrictions: No    Pain: Pain Assessment Pain Scale: Faces Faces Pain Scale: No hurt   Therapy/Group: Individual  Therapy  Lorie Phenix 05/10/2020, 3:14 PM

## 2020-05-10 NOTE — Progress Notes (Signed)
[facility] [place] PHYSICAL MEDICINE & REHABILITATION PROGRESS NOTE   Subjective/Complaints: Cognitive symptoms are much improved, discussed with her that UA was positive for UTI and she is getting antibiotics. Discussed that this was likely cause of her cognitive symptoms Her pain and anxiety have been well controlled of Norco and Xanax.   Review of systems: Denies CP, SOB, N/V/D, confusion, +pain in bilateral rash in lower extremities  Objective:   No results found. No results for input(s): WBC, HGB, HCT, PLT in the last 72 hours. No results for input(s): NA, K, CL, CO2, GLUCOSE, BUN, CREATININE, CALCIUM in the last 72 hours.  Intake/Output Summary (Last 24 hours) at 05/10/2020 0926 Last data filed at 05/10/2020 0756 Gross per 24 hour  Intake 360 ml  Output -  Net 360 ml    Physical Exam: Vital Signs Blood pressure 98/60, pulse 88, temperature 97.7 F (36.5 C), temperature source Oral, resp. rate 16, height 5' (1.524 m), weight 56.6 kg, SpO2 100 %. Gen: no distress, normal appearing HEENT: oral mucosa pink and moist, NCAT Cardio: Reg rate Chest: normal effort, normal rate of breathing Abd: soft, non-distended Ext: no edema Psych: pleasant, normal affect Skin: Contact dermatitis at ankles. Scattered ecchymosis throughout bilateral lower and upper extremities  Psych: Normal mood.  Normal behavior. Musc: No edema in extremities.  No tenderness in extremities. Neuro: Alert Motor: 4/5 throughout (?left slightly weaker)  Assessment/Plan: 1. Functional deficits which require 3+ hours per day of interdisciplinary therapy in a comprehensive inpatient rehab setting.  Physiatrist is providing close team supervision and 24 hour management of active medical problems listed below.  Physiatrist and rehab team continue to assess barriers to discharge/monitor patient progress toward functional and medical goals  Care Tool:  Bathing    Body parts bathed by patient: Right arm,Left  arm,Chest,Abdomen,Front perineal area,Right upper leg,Left upper leg,Face,Right lower leg,Left lower leg   Body parts bathed by helper: Buttocks,Right lower leg,Left lower leg     Bathing assist Assist Level: Minimal Assistance - Patient > 75%     Upper Body Dressing/Undressing Upper body dressing   What is the patient wearing?: Pull over shirt    Upper body assist Assist Level: Minimal Assistance - Patient > 75%    Lower Body Dressing/Undressing Lower body dressing      What is the patient wearing?: Pants,Incontinence brief     Lower body assist Assist for lower body dressing: Minimal Assistance - Patient > 75%     Toileting Toileting    Toileting assist Assist for toileting: Maximal Assistance - Patient 25 - 49%     Transfers Chair/bed transfer  Transfers assist     Chair/bed transfer assist level: Moderate Assistance - Patient 50 - 74% Chair/bed transfer assistive device: Geologist, engineering   Ambulation assist      Assist level: Moderate Assistance - Patient 50 - 74% Assistive device: Walker-rolling Max distance: 4ft   Walk 10 feet activity   Assist     Assist level: Moderate Assistance - Patient - 50 - 74% Assistive device: Walker-rolling   Walk 50 feet activity   Assist Walk 50 feet with 2 turns activity did not occur: Safety/medical concerns         Walk 150 feet activity   Assist Walk 150 feet activity did not occur: N/A (pt does not ambulate this distance at basline)         Walk 10 feet on uneven surface  activity   Assist Walk 10 feet on  uneven surfaces activity did not occur: Safety/medical concerns         Wheelchair     Assist Will patient use wheelchair at discharge?: No             Wheelchair 50 feet with 2 turns activity    Assist            Wheelchair 150 feet activity     Assist          Blood pressure 98/60, pulse 88, temperature 97.7 F (36.5 C), temperature  source Oral, resp. rate 16, height 5' (1.524 m), weight 56.6 kg, SpO2 100 %.   Medical Problem List and Plan: 1.  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  Antithrombotics: -DVT/anticoagulation: Eliquis 5 mg twice daily, d/ced due to GI bleed and increasing hematoma, Neuro aware.    Lower extremity Dopplers negative             -antiplatelet therapy: Aspirin 81 mg daily 3. Pain Management: Continue Hydrocodone as needed             2/7: does have some pain in rash in lower extremities. She has been using hydrocodone twice per day- says she uses it once per day at home. Says she uses it for diffuse arthitic pains. She has not tried voltaren gel but is willing to.   2/8: diclofenac gel helped, continue  2/9: has not required Norco in 2 days! Continue diclofenac gel   2/10: received one Norco this morning and does appear to be very confused- will d/c as Norco can contribute to confusion.   2/11: pain has been well controlled off Norco.   Monitor with increased exertion 4. Mood: Cymbalta 60 mg daily, Wellbutrin 150 mg daily, Ativan 0.5 mg every 8 hours as needed- d/ced as can contribute to confusion             -antipsychotic agents: N/A 5. Neuropsych: This patient is capable of making decisions on her own behalf. 6. Skin/Wound Care: Routine skin checks 7. Fluids/Electrolytes/Nutrition: Routine in and outs.   8.  History of PAF/CAD/PFO.  30-day cardiac event monitor per cardiology services Dr.Kardie Tobb.  Continue Eliquis for now.  If EF improves and no atrial fibrillation in 3 months plan to stop Eliquis and restart Plavix.             Monitor with increased activity 9.  Transaminitis.  Follow-up gastroenterology service Dr. Marina Goodell.               LFTs elevated on 1/31, AST and ALT trending downward on 2/7, repeat CMP ordered for 2/14.  10.  Hyperlipidemia.  Crestor on hold due to elevated LFTs. 11.  Rheumatoid  arthritis: Low-dose prednisone as well as weekly Orencia (was on Ansid at home flurbiprofen)             Having rheumatoid flare  20mg  prednisone x 2d,  reduce to 10mg  on 2/3 and 5mg  on 2/51may have caused gastritis 12.  Diabetes mellitus.  Hemoglobin A1c 6.1.  SSI.            CBG (last 3)  Recent Labs    05/09/20 1617 05/09/20 2118 05/10/20 0607  GLUCAP 144* 124* 130*     2/11: CBGs 124-144: continue Metformin 500mg  BID 13.  Hypothyroidism: Synthroid 14.  Essential hypertension.. Blood pressure had remained a bit soft Toprol-XL resumed at 12.5 mg daily 04/26/2020   Vitals:  05/09/20 1953 05/10/20 0435  BP: (!) 98/42 98/60  Pulse: 93 88  Resp: 16 16  Temp: 98.2 F (36.8 C) 97.7 F (36.5 C)  SpO2: 97% 100%   2/11: BP soft to 98/42 despite d/c of Toprol. Continue abdominal binder with therapy.  15.  Constipation but multiple stools post sorbitol 1/29 16. Sleep disturbance: melatonin 3mg  started HS  Improving 17.  GI bleed with drop in Hgb, suspect lower GI bleed given bright red blood  Hb 10.1 on 2/5, cont to monitor 18. Tachycardia: HR 88-93 off Toprol  19. Confusion:   2/9: Patient says she slept poorly last night. Head CT was done yesterday and showed no acute abnormality.   2/10: UA+: started Keflex. UC shows no growth, but I will continue abx given significant improvement in cognition.   LOS: 14 days A FACE TO FACE EVALUATION WAS PERFORMED  4/10 Luberta Grabinski 05/10/2020, 9:26 AM

## 2020-05-10 NOTE — Progress Notes (Signed)
Occupational Therapy Session Note  Patient Details  Name: Madison Hurst MRN: [id number] Date of Birth: 12/03/1960  Today's Date: 05/10/2020 OT Individual Time: 0930-1000 OT Individual Time Calculation (min): 30 min    Short Term Goals: Week 1:  OT Short Term Goal 1 (Week 1): Pt will complete UB dressing with min assist to donn pullover shirt. OT Short Term Goal 1 - Progress (Week 1): Not met OT Short Term Goal 2 (Week 1): Pt will complete toilet transfer with min assist stand pivot. OT Short Term Goal 2 - Progress (Week 1): Met OT Short Term Goal 3 (Week 1): Pt will complete LB dressing with min assist for donning brief and pants only sit to stand. OT Short Term Goal 3 - Progress (Week 1): Not met  Skilled Therapeutic Interventions/Progress Updates:    1:1. Pt received in bed agreeable to dressing and grooming at sink. Pt sup>sit with CGA and dons shirt with MIN A to pull overhead and MOD A to don pants sit to stand for pulling pants past hips. Pt completes stand pivot transfer with RW with MIN A and VC for stepping back to chair. Pt educated on reacher use to put shirt overhead. Pt grooms at sink with set up. Exited session with pt seated in bed, exit alarm on and call light in reach   Therapy Documentation Precautions:  Precautions Precautions: Fall Precaution Comments: EF 20-25%, L hemiparesis, RA (hands, shoulders, and ankles) Restrictions Weight Bearing Restrictions: No General:   Vital Signs: Therapy Vitals Temp: 97.7 F (36.5 C) Temp Source: Oral Pulse Rate: 88 Resp: 16 BP: 98/60 Patient Position (if appropriate): Lying Oxygen Therapy SpO2: 100 % O2 Device: Room Air Pain:   ADL: ADL Eating: Set up (per pt she needs assist with cutting up food) Where Assessed-Eating: Wheelchair Grooming: Minimal assistance Where Assessed-Grooming: Sitting at sink Upper Body Bathing: Minimal assistance Where Assessed-Upper Body Bathing: Wheelchair,Sitting at  sink Lower Body Bathing: Moderate assistance Where Assessed-Lower Body Bathing: Wheelchair,Standing at sink,Sitting at sink Upper Body Dressing: Maximal assistance Where Assessed-Upper Body Dressing: Standing at sink,Sitting at sink Lower Body Dressing: Maximal assistance Where Assessed-Lower Body Dressing: Wheelchair,Standing at sink,Sitting at sink Toileting: Moderate assistance Where Assessed-Toileting: Bedside Commode Toilet Transfer: Moderate assistance Toilet Transfer Method: Stand pivot Toilet Transfer Equipment: Bedside commode Tub/Shower Transfer: Not assessed Walk-In Shower Transfer: Not assessed Vision   Perception    Praxis   Exercises:   Other Treatments:     Therapy/Group: Individual Therapy  Stephanie M Schlosser 05/10/2020, 6:39 AM  

## 2020-05-10 NOTE — Plan of Care (Signed)
Problem: RH Balance Goal: LTG Patient will maintain dynamic standing with ADLs (OT) Description: LTG:  Patient will maintain dynamic standing balance with assist during activities of daily living (OT)  Flowsheets (Taken 05/10/2020 1624) LTG: Pt will maintain dynamic standing balance during ADLs with: (Goal downgraded based on fluctuating progress.) Minimal Assistance - Patient > 75% Note: Goal downgraded based on fluctuating progress.   Problem: Sit to Stand Goal: LTG:  Patient will perform sit to stand in prep for activites of daily living with assistance level (OT) Description: LTG:  Patient will perform sit to stand in prep for activites of daily living with assistance level (OT) Flowsheets (Taken 05/10/2020 1624) LTG: PT will perform sit to stand in prep for activites of daily living with assistance level: (Goal downgraded based on fluctuating progress.) Minimal Assistance - Patient > 75% Note: Goal downgraded based on fluctuating progress.   Problem: RH Bathing Goal: LTG Patient will bathe all body parts with assist levels (OT) Description: LTG: Patient will bathe all body parts with assist levels (OT) Flowsheets (Taken 05/10/2020 1624) LTG: Pt will perform bathing with assistance level/cueing: (Goal downgraded based on fluctuating progress.) -- Note: Goal downgraded based on fluctuating progress.   Problem: RH Dressing Goal: LTG Patient will perform upper body dressing (OT) Description: LTG Patient will perform upper body dressing with assist, with/without cues (OT). Flowsheets (Taken 05/10/2020 1624) LTG: Pt will perform upper body dressing with assistance level of: (Goal downgraded based on fluctuating progress.) Minimal Assistance - Patient > 75% Note: Goal downgraded based on fluctuating progress. Goal: LTG Patient will perform lower body dressing w/assist (OT) Description: LTG: Patient will perform lower body dressing with assist, with/without cues in positioning using equipment  (OT) Flowsheets (Taken 05/10/2020 1624) LTG: Pt will perform lower body dressing with assistance level of: (Goal downgraded based on fluctuating progress.) Moderate Assistance - Patient 50 - 74% Note: Goal downgraded based on fluctuating progress.   Problem: RH Toileting Goal: LTG Patient will perform toileting task (3/3 steps) with assistance level (OT) Description: LTG: Patient will perform toileting task (3/3 steps) with assistance level (OT)  Flowsheets (Taken 05/10/2020 1624) LTG: Pt will perform toileting task (3/3 steps) with assistance level: (Goal downgraded based on fluctuating progress.) Moderate Assistance - Patient 50 - 74% Note: Goal downgraded based on fluctuating progress.   Problem: RH Toilet Transfers Goal: LTG Patient will perform toilet transfers w/assist (OT) Description: LTG: Patient will perform toilet transfers with assist, with/without cues using equipment (OT) Flowsheets (Taken 05/10/2020 1624) LTG: Pt will perform toilet transfers with assistance level of: (Goal downgraded based on fluctuating progress.) Minimal Assistance - Patient > 75% Note: Goal downgraded based on fluctuating progress.   Problem: RH Tub/Shower Transfers Goal: LTG Patient will perform tub/shower transfers w/assist (OT) Description: LTG: Patient will perform tub/shower transfers with assist, with/without cues using equipment (OT) Flowsheets (Taken 05/10/2020 1624) LTG: Pt will perform tub/shower stall transfers with assistance level of: (Goal downgraded based on fluctuating progress.) Minimal Assistance - Patient > 75% LTG: Pt will perform tub/shower transfers from: Walk in shower Note: Goal downgraded based on fluctuating progress.   Problem: RH Memory Goal: LTG Patient will demonstrate ability for day to day recall/carry over during activities of daily living with assistance level (OT) Description: LTG:  Patient will demonstrate ability for day to day recall/carry over during activities of  daily living with assistance level (OT). Flowsheets (Taken 05/10/2020 1624) LTG:  Patient will demonstrate ability for day to day recall/carry over during activities of daily  living with assistance level (OT): (Goal downgraded based on fluctuating progress.) Minimal Assistance - Patient > 75% Note: Goal downgraded based on fluctuating progress.   Problem: RH Awareness Goal: LTG: Patient will demonstrate awareness during functional activites type of (OT) Description: LTG: Patient will demonstrate awareness during functional activites type of (OT) Flowsheets (Taken 05/10/2020 1624) Patient will demonstrate awareness during functional activites type of: (Goal downgraded based on fluctuating progress.) Emergent LTG: Patient will demonstrate awareness during functional activites type of (OT): (Goal downgraded based on fluctuating progress.) Minimal Assistance - Patient > 75%

## 2020-05-10 NOTE — Progress Notes (Signed)
Speech Language Pathology Daily Session Note  Patient Details  Name: Madison Hurst MRN: [id number] Date of Birth: April 21, 1960  Today's Date: 05/10/2020 SLP Individual Time: 1017-5102 SLP Individual Time Calculation (min): 27 min  Short Term Goals: Week 2: SLP Short Term Goal 1 (Week 2): STGs=LTGs due to ELOS  Skilled Therapeutic Interventions: Pt was seen for skilled ST targeting education with pt and her husband. SLP provided verbal review of ST goals and progress related to cognition, as well as changes/fluctuations that occurred over the last 2 days s/p UTI. Made explicit recommendations for full assist with finance management and medications, cooking, as well as 24/7 supervision for greatest safety at home, due to cognition. SLP discussed how memory and problem solving deficits impact daily functioning as well as compensatory strategies for attention and memory - handout provided to reinforce this information. Pt and husband were receptive to education and in verbal agreement with SLP. All follow up questions answered to their satisfaction. Pt left sitting in wheelchair with alarm set and needs within reach. Continue per current plan of care.      Pain Pain Assessment Pain Scale: Faces Faces Pain Scale: No hurt  Therapy/Group: Individual Therapy  Madison Hurst 05/10/2020, 7:20 AM

## 2020-05-10 NOTE — Progress Notes (Signed)
Speech Language Pathology Daily Session Note  Patient Details  Name: Madison Hurst MRN: [id number] Date of Birth: [birth year]  Today's Date: 05/10/2020 SLP Individual Time: 7829-5621 SLP Individual Time Calculation (min): 56 min  Short Term Goals: Week 2: SLP Short Term Goal 1 (Week 2): STGs=LTGs due to ELOS  Skilled Therapeutic Interventions: Pt was seen for skilled ST targeting cognitive goals. Confusion noted to be moderately improved since last ST visit. She required Min A cues to orient to date with use of calendar today. Pt able to recall current medication names and/or functions with ~60% accuracy with Min A verbal cues. She used a list of current medications to organize a BID pill box with overall Min A verbal and visual cues for error awareness, although able to problem solve throughout this familiar task with Supervision A verbal cues. She sustained attention with, with mostly Supervision A verbal cues for redirection. Pt expressed need to void during session, therefore assisted with transfer from wheelchair to toilet. She required Supervision A verbal cues for recall and sequencing during transfers and was continent of urine. Pt left sitting in wheelchair with alarm set and needs within reach. Continue per current plan of care.      Pain Pain Assessment Pain Scale: Faces Pain Score: 0-No pain Faces Pain Scale: No hurt  Therapy/Group: Individual Therapy  [name] 05/10/2020, 7:20 AM

## 2020-05-10 NOTE — Telephone Encounter (Signed)
Patient's brother and husband would like to speak to the rehab doctor, they have some issues they need to discuss.  I did explain to them, that Dr. Wynn Banker is on vacation this week and that Dr. Carlis Abbott is rounding on his patients.

## 2020-05-10 NOTE — Progress Notes (Signed)
Occupational Therapy Weekly Progress Note  Patient Details  Name: Madison Hurst MRN: [id number] Date of Birth: 04/21/1960  Beginning of progress report period: May 05, 2019 End of progress report period: May 11, 2019  Today's Date: 05/10/2020 OT Individual Time: 1330-1420 OT Individual Time Calculation (min): 50 min    Patient has met 1 of 3 short term goals.  Mrs. Sem continues to make fluctuating progress with OT at this time secondary to attention and cognition.  She can complete UB selfcare at a min assist level overall with supervision for UB bathing if sustained attention is intact.  LB bathing and dressing is at a mod assist level sit to stand.  She continues to need overall min to mod assist for stand pivot transfers with use of the RW for support, with increased LOB and posterior lean noted at times.  Recent UTI caused her balance and overall performance of selfcare tasks to be worse, resulting in the need for mod to max assist. Currently her attention has improved some and she can follow 1-2 step commands more consistently related to selfcare tasks.  She still demonstrates some decreased awareness of date and reason for hospitalization.  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  Feel she will likely need more min to mod assist at home secondary to her current limitations and fluctuating progress.  Will continue with recommendation for CIR level therapy until expected discharge on 2/17.      Patient continues to demonstrate the following deficits: muscle weakness and muscle joint tightness, unbalanced muscle activation and decreased coordination, decreased attention, decreased awareness, decreased problem solving, decreased safety awareness, decreased memory and delayed processing  and decreased sitting balance, decreased standing balance, decreased postural control and decreased balance strategies and therefore will continue to benefit from skilled OT intervention to enhance overall performance with BADL and Reduce care partner burden.  Patient not progressing toward long term goals.  See goal revision..  Continue plan of care.  OT Short Term Goals Week 3:  OT Short Term Goal 1 (Week 3): Continue working on established short term goals set a min assist overall.  Skilled Therapeutic Interventions/Progress Updates:   Pt's spouse in for family education session.  Had him assist pt with stand pivot transfers to the toilet with and without use of the RW.  Pt needed mod instructional cueing for hand placement during sit to stand with use of the gait belt needed for safety secondary to pt's spouse having hemiparesis.  He was able to safely assist her with transfers to the toilet using both methods, however the pt states that she felt safer with use of the RW.  This therapist feels that the pt and spouse will likely do better with use of the RW as well.  She needed max assist for clothing management to push down and pull up brief.  At home she only wears night gowns most of the time, so it should not be as much of an issue, however she may still need min assist for pulling up underpants.  Also educated pt and pt's spouse on completion of BUE AAROM exercises for the shoulder, hand, and wrist.  Therapist demonstrated completion of them on pt with spouse watching.  Handout was also provided for reference as well.  Took bilateral hand splints to show  for night time positioning, however they were not donned secondary to decreased time and therapist wanting to be able to fit and monitor them first before having her wear them at night.  Finished session with pt sitting in the wheelchair and with PT in to take over for next education session.     Therapy Documentation Precautions:   Precautions Precautions: Fall Precaution Comments: EF 20-25%, L hemiparesis, RA (hands, shoulders, and ankles) Restrictions Weight Bearing Restrictions: No  Pain: Pain Assessment Pain Scale: Faces Pain Score: 0-No pain Faces Pain Scale: No hurt ADL: See Care Tool Section for some details of mobility and selfcare  Therapy/Group: Individual Therapy  Bluma Buresh OTR/L 05/10/2020, 4:01 PM

## 2020-05-10 NOTE — Telephone Encounter (Signed)
I am calling now

## 2020-05-11 DIAGNOSIS — F05 Delirium due to known physiological condition: Secondary | ICD-10-CM

## 2020-05-11 LAB — GLUCOSE, CAPILLARY
Glucose-Capillary: 123 mg/dL — ABNORMAL HIGH (ref 70–99)
Glucose-Capillary: 139 mg/dL — ABNORMAL HIGH (ref 70–99)
Glucose-Capillary: 168 mg/dL — ABNORMAL HIGH (ref 70–99)
Glucose-Capillary: 157 mg/dL — ABNORMAL HIGH (ref 70–99)

## 2020-05-11 NOTE — Progress Notes (Signed)
Physical Therapy Session Note  Patient Details  Name: Madison Hurst MRN: [id number] Date of Birth: 05-01-1960  Today's Date: 05/11/2020 PT Individual Time: 6237-6283 PT Individual Time Calculation (min): 27 min   Short Term Goals: Week 3:  PT Short Term Goal 1 (Week 3): = to LTGs based on ELOS  Skilled Therapeutic Interventions/Progress Updates:    session 1: PT setting up power wheelchair for patient to trial. She was agreeable to PT explanation of powerwc and safety components and denied pain throughout session. Patient remained very lethargic throughout session often falling back asleep requiring multimodal cues from PT. Patient remained in bed at end of session, bed alarm on, call light within reach.   Session 2: Patient received supine in bed, agreeable to PT. She denies pain- requesting to use bathroom. She was able to come sit edge of bed with supervision using bed rails and ambulate to bathroom using RW and MinA. R lateral trunk lean and trendelenburg noted. Patient continent of bladder. MinA for clothing management in standing and supervision for perihygiene. Patient re-oriented to safety features of powerwc and agreeable to trialing it. Current wc is too large for patient- seat is too deep and feet do not reach foot plates. Also, control mount is too far L lateral so patient had increased difficulty steering. She required MinA and mod verbal cuing to not hit walls/objects. She would often take turns very sharp, often clipping the wall. Overall, patient demonstrated decent safety awareness with powerwc, but would benefit from a proper fitting chair as well as more practice. Patient returning to room, transferring to bed. Bed alarm on, call light within reach.   Therapy Documentation Precautions:  Precautions Precautions: Fall Precaution Comments: EF 20-25%, L hemiparesis, RA (hands, shoulders, and ankles) Restrictions Weight Bearing Restrictions:  No    Therapy/Group: Individual Therapy  Elizebeth Koller, PT, DPT, CBIS  05/11/2020, 7:33 AM

## 2020-05-11 NOTE — Plan of Care (Signed)
  Problem: Consults Goal: RH STROKE PATIENT EDUCATION Description: See Patient Education module for education specifics  Outcome: Progressing Goal: Nutrition Consult-if indicated Outcome: Progressing Goal: Diabetes Guidelines if Diabetic/Glucose > 140 Description: If diabetic or lab glucose is > 140 mg/dl - Initiate Diabetes/Hyperglycemia Guidelines & Document Interventions  Outcome: Progressing   Problem: RH BOWEL ELIMINATION Goal: RH STG MANAGE BOWEL WITH ASSISTANCE Description: STG Manage Bowel with supervision. Outcome: Progressing Goal: RH STG MANAGE BOWEL W/MEDICATION W/ASSISTANCE Description: STG Manage Bowel with Medication with mod I Outcome: Progressing   Problem: RH BLADDER ELIMINATION Goal: RH STG MANAGE BLADDER WITH ASSISTANCE Description: STG Manage Bladder With min Assistance Outcome: Progressing   Problem: RH SKIN INTEGRITY Goal: RH STG SKIN FREE OF INFECTION/BREAKDOWN Description: Min assist Outcome: Progressing Goal: RH STG MAINTAIN SKIN INTEGRITY WITH ASSISTANCE Description: STG Maintain Skin Integrity With min Assistance. Outcome: Progressing   Problem: RH SAFETY Goal: RH STG ADHERE TO SAFETY PRECAUTIONS W/ASSISTANCE/DEVICE Description: STG Adhere to Safety Precautions With min Assistance/Device. Outcome: Progressing Goal: RH STG DECREASED RISK OF FALL WITH ASSISTANCE Description: STG Decreased Risk of Fall With min Assistance. Outcome: Progressing   Problem: RH COGNITION-NURSING Goal: RH STG USES MEMORY AIDS/STRATEGIES W/ASSIST TO PROBLEM SOLVE Description: STG Uses Memory Aids/Strategies With supervision Assistance to Problem Solve. Outcome: Progressing Goal: RH STG ANTICIPATES NEEDS/CALLS FOR ASSIST W/ASSIST/CUES Description: STG Anticipates Needs/Calls for Assist With Assistance/Cues. Outcome: Progressing   Problem: RH PAIN MANAGEMENT Goal: RH STG PAIN MANAGED AT OR BELOW PT'S PAIN GOAL Description: Pain less than or equal to 2. Outcome:  Progressing   Problem: RH KNOWLEDGE DEFICIT Goal: RH STG INCREASE KNOWLEDGE OF DIABETES Description: Patient and spouse will be able to manage DM with medications and dietary modifications using handouts and educational materials independently Outcome: Progressing Goal: RH STG INCREASE KNOWLEDGE OF HYPERTENSION Description: Patient and spouse will be able to manage HTN with medications and dietary modifications using handouts and educational materials independently Outcome: Progressing Goal: RH STG INCREASE KNOWLEGDE OF HYPERLIPIDEMIA Description: Patient and spouse will be able to manage HLD with medications and dietary modifications using handouts and educational materials independently Outcome: Progressing Goal: RH STG INCREASE KNOWLEDGE OF STROKE PROPHYLAXIS Outcome: Progressing   Problem: RH Pre-functional/Other (Specify) Goal: RH LTG Pre-functional (Specify) Outcome: Progressing Goal: RH LTG Interdisciplinary (Specify) 1 Description: RH LTG Interdisciplinary (Specify)1 Outcome: Progressing Goal: RH LTG Interdisciplinary (Specify) 2 Description: RH LTG Interdisciplinary (Specify) 2  Outcome: Progressing

## 2020-05-11 NOTE — Progress Notes (Signed)
[place] PHYSICAL MEDICINE & REHABILITATION PROGRESS NOTE   Subjective/Complaints: Pt is up in bed. Said she slept ok. Remains a little confused.   ROS: Limited due to cognitive/behavioral   Objective:   No results found. No results for input(s): WBC, HGB, HCT, PLT in the last 72 hours. No results for input(s): NA, K, CL, CO2, GLUCOSE, BUN, CREATININE, CALCIUM in the last 72 hours.  Intake/Output Summary (Last 24 hours) at 05/11/2020 1048 Last data filed at 05/11/2020 0718 Gross per 24 hour  Intake 477 ml  Output --  Net 477 ml        Physical Exam: Vital Signs Blood pressure 122/80, pulse 65, temperature 97.7 F (36.5 C), resp. rate 18, height 5' (1.524 m), weight 56.6 kg, SpO2 100 %. Constitutional: No distress . Vital signs reviewed. HEENT: EOMI, oral membranes moist Neck: supple Cardiovascular: RRR without murmur. No JVD    Respiratory/Chest: CTA Bilaterally without wheezes or rales. Normal effort    GI/Abdomen: BS +, non-tender, non-distended Ext: no clubbing, left toes a little blue appearing, cool to touch, apparently chronic Psych: pleasant and cooperative, confused Skin: Contact dermatitis at ankles. Scattered ecchymosis throughout bilateral lower and upper extremities  Musc: No edema in extremities.  Left foot, ankle sl tender Neuro: Alert, oriented to person, place Motor: 4+/5 RUE and RLE and 4/5 LUE and LLE  Assessment/Plan: 1. Functional deficits which require 3+ hours per day of interdisciplinary therapy in a comprehensive inpatient rehab setting.  Physiatrist is providing close team supervision and 24 hour management of active medical problems listed below.  Physiatrist and rehab team continue to assess barriers to discharge/monitor patient progress toward functional and medical goals  Care Tool:  Bathing    Body parts bathed by patient: Right arm,Left arm,Chest,Abdomen,Front perineal area,Right upper leg,Left upper leg,Face,Right lower leg,Left  lower leg   Body parts bathed by helper: Buttocks,Right lower leg,Left lower leg     Bathing assist Assist Level: Minimal Assistance - Patient > 75%     Upper Body Dressing/Undressing Upper body dressing   What is the patient wearing?: Pull over shirt    Upper body assist Assist Level: Minimal Assistance - Patient > 75%    Lower Body Dressing/Undressing Lower body dressing      What is the patient wearing?: Pants,Incontinence brief     Lower body assist Assist for lower body dressing: Minimal Assistance - Patient > 75%     Toileting Toileting    Toileting assist Assist for toileting: Maximal Assistance - Patient 25 - 49%     Transfers Chair/bed transfer  Transfers assist     Chair/bed transfer assist level: Moderate Assistance - Patient 50 - 74% Chair/bed transfer assistive device: Geologist, engineering   Ambulation assist      Assist level: Moderate Assistance - Patient 50 - 74% Assistive device: Walker-rolling Max distance: 67ft   Walk 10 feet activity   Assist     Assist level: Moderate Assistance - Patient - 50 - 74% Assistive device: Walker-rolling   Walk 50 feet activity   Assist Walk 50 feet with 2 turns activity did not occur: Safety/medical concerns         Walk 150 feet activity   Assist Walk 150 feet activity did not occur: N/A (pt does not ambulate this distance at basline)         Walk 10 feet on uneven surface  activity   Assist Walk 10 feet on uneven surfaces activity did not occur:  Safety/medical concerns         Wheelchair     Assist Will patient use wheelchair at discharge?: No             Wheelchair 50 feet with 2 turns activity    Assist            Wheelchair 150 feet activity     Assist          Blood pressure 122/80, pulse 65, temperature 97.7 F (36.5 C), resp. rate 18, height 5' (1.524 m), weight 56.6 kg, SpO2 100 %.   Medical Problem List and Plan: 1.  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  Antithrombotics: -DVT/anticoagulation: Eliquis 5 mg twice daily, d/ced due to GI bleed and increasing hematoma, Neuro aware.    Lower extremity Dopplers negative             -antiplatelet therapy: Aspirin 81 mg daily 3. Pain Management: Continue Hydrocodone as needed             2/7: does have some pain in rash in lower extremities. She has been using hydrocodone twice per day- says she uses it once per day at home. Says she uses it for diffuse arthitic pains. She has not tried voltaren gel but is willing to.   2/8: diclofenac gel helped, continue  2/9: has not required Norco in 2 days! Continue diclofenac gel   2/12 norco stopped yesterday. Compared to report seems clearer today. Still sl confused. Pain appears to be controlled also 4. Mood: Cymbalta 60 mg daily, Wellbutrin 150 mg daily, Ativan 0.5 mg every 8 hours as needed- d/ced d/t AMS             -antipsychotic agents: N/A 5. Neuropsych: This patient is capable of making decisions on her own behalf. 6. Skin/Wound Care: Routine skin checks 7. Fluids/Electrolytes/Nutrition: Routine in and outs.   8.  History of PAF/CAD/PFO.  30-day cardiac event monitor per cardiology services Dr.Kardie Tobb.  Continue Eliquis for now.  If EF improves and no atrial fibrillation in 3 months plan to stop Eliquis and restart Plavix.             Monitor with increased activity 9.  Transaminitis.  Follow-up gastroenterology service Dr. Marina Goodell.               LFTs elevated on 1/31, AST and ALT trending downward on 2/7, repeat CMP ordered for 2/14.  10.  Hyperlipidemia.  Crestor on hold due to elevated LFTs. 11.  Rheumatoid arthritis: Low-dose prednisone as well as weekly Orencia (was on Ansid at home flurbiprofen)             Having rheumatoid flare  20mg  prednisone x 2d,  reduce to 10mg  on 2/3 and 5mg  on 2/43may have caused gastritis 12.   Diabetes mellitus.  Hemoglobin A1c 6.1.  SSI.            CBG (last 3)  Recent Labs    05/10/20 1625 05/10/20 2141 05/11/20 0534  GLUCAP 149* 103* 123*   2/8: CBGs 118-159: increase metformin with lunch to 500mg . Will maintain Toprol since still tachy.   2/12: CBGs under reasonable control  13.  Hypothyroidism: Synthroid 14.  Essential hypertension.. Blood pressure had remained a bit soft Toprol-XL resumed at 12.5 mg daily 04/26/2020   Vitals:   05/10/20 1938 05/11/20 0358  BP: 113/89 122/80  Pulse: 72 65  Resp:  16 18  Temp: 98.3 F (36.8 C) 97.7 F (36.5 C)  SpO2: 94% 100%   BP's more robust,  continue Toprol for rate control.  15.  Constipation: multiple stools post sorbitol 1/29 16. Sleep disturbance: melatonin 3mg  started HS  Improving 17.  GI bleed with drop in Hgb, suspect lower GI bleed given bright red blood  Hb 10.1 on 2/5, cont to monitor 18. Tachycardia: continue Toprol.  19. Confusion:   2/9: Patient says she slept poorly last night. Head CT was done yesterday and showed no acute abnormality.   2/12: mental status appears better today   -UA was equivocal. ucx negative:    -continue keflex for 3 days total and stop.    -medications removed as above  LOS: 15 days A FACE TO FACE EVALUATION WAS PERFORMED  Ranelle Oyster 05/11/2020, 10:48 AM

## 2020-05-12 LAB — GLUCOSE, CAPILLARY
Glucose-Capillary: 145 mg/dL — ABNORMAL HIGH (ref 70–99)
Glucose-Capillary: 178 mg/dL — ABNORMAL HIGH (ref 70–99)
Glucose-Capillary: 223 mg/dL — ABNORMAL HIGH (ref 70–99)
Glucose-Capillary: 179 mg/dL — ABNORMAL HIGH (ref 70–99)

## 2020-05-12 MED ORDER — ONDANSETRON HCL 4 MG PO TABS
4.0000 mg | ORAL_TABLET | Freq: Four times a day (QID) | ORAL | Status: DC | PRN
  Administered 2020-05-12: 4 mg via ORAL
  Filled 2020-05-12: qty 1

## 2020-05-12 MED ORDER — HYDROCORTISONE 1 % EX CREA
TOPICAL_CREAM | CUTANEOUS | Status: DC | PRN
  Filled 2020-05-12: qty 28

## 2020-05-12 MED ORDER — ACETAMINOPHEN 325 MG PO TABS
650.0000 mg | ORAL_TABLET | Freq: Four times a day (QID) | ORAL | Status: DC | PRN
  Administered 2020-05-12 – 2020-05-16 (×3): 650 mg via ORAL
  Filled 2020-05-12 (×3): qty 2

## 2020-05-12 NOTE — Progress Notes (Signed)
PHYSICAL MEDICINE & REHABILITATION PROGRESS NOTE   Subjective/Complaints: Feeling well. Slept last night. Feels that she's thinking more clearly.   ROS: Patient denies fever, rash, sore throat, blurred vision, nausea, vomiting, diarrhea, cough, shortness of breath or chest pain, joint or back pain, headache, or mood change.   Objective:   No results found. No results for input(s): WBC, HGB, HCT, PLT in the last 72 hours. No results for input(s): NA, K, CL, CO2, GLUCOSE, BUN, CREATININE, CALCIUM in the last 72 hours.  Intake/Output Summary (Last 24 hours) at 05/12/2020 0920 Last data filed at 05/12/2020 0816 Gross per 24 hour  Intake 480 ml  Output --  Net 480 ml        Physical Exam: Vital Signs Blood pressure (!) 106/92, pulse 73, temperature 98.3 F (36.8 C), temperature source Oral, resp. rate 18, height 5' (1.524 m), weight 56.6 kg, SpO2 99 %. Constitutional: No distress . Vital signs reviewed. HEENT: EOMI, oral membranes moist Neck: supple Cardiovascular: RRR without murmur. No JVD    Respiratory/Chest: CTA Bilaterally without wheezes or rales. Normal effort    GI/Abdomen: BS +, non-tender, non-distended Ext: toes on left foot still bluish which his her baseline Psych: pleasant and cooperative Skin: Contact dermatitis at ankles.  Scattered ecchymosis throughout bilateral lower and upper extremities  Musc: No edema in extremities.  Left foot, ankle sl tender Neuro: Alert, oriented to person, place, date/month. Follows commands. Reasonable insight Motor: 4+/5 RUE and RLE and 4/5 LUE and LLE  Assessment/Plan: 1. Functional deficits which require 3+ hours per day of interdisciplinary therapy in a comprehensive inpatient rehab setting.  Physiatrist is providing close team supervision and 24 hour management of active medical problems listed below.  Physiatrist and rehab team continue to assess barriers to discharge/monitor patient progress toward functional  and medical goals  Care Tool:  Bathing    Body parts bathed by patient: Right arm,Left arm,Chest,Abdomen,Front perineal area,Right upper leg,Left upper leg,Face,Right lower leg,Left lower leg   Body parts bathed by helper: Buttocks,Right lower leg,Left lower leg     Bathing assist Assist Level: Minimal Assistance - Patient > 75%     Upper Body Dressing/Undressing Upper body dressing   What is the patient wearing?: Pull over shirt    Upper body assist Assist Level: Minimal Assistance - Patient > 75%    Lower Body Dressing/Undressing Lower body dressing      What is the patient wearing?: Pants,Incontinence brief     Lower body assist Assist for lower body dressing: Minimal Assistance - Patient > 75%     Toileting Toileting    Toileting assist Assist for toileting: Maximal Assistance - Patient 25 - 49%     Transfers Chair/bed transfer  Transfers assist     Chair/bed transfer assist level: Minimal Assistance - Patient > 75% Chair/bed transfer assistive device: Geologist, engineering   Ambulation assist      Assist level: Minimal Assistance - Patient > 75% Assistive device: Walker-rolling Max distance: 58ft   Walk 10 feet activity   Assist     Assist level: Minimal Assistance - Patient > 75% Assistive device: Walker-rolling   Walk 50 feet activity   Assist Walk 50 feet with 2 turns activity did not occur: Safety/medical concerns         Walk 150 feet activity   Assist Walk 150 feet activity did not occur: N/A (pt does not ambulate this distance at basline)  Walk 10 feet on uneven surface  activity   Assist Walk 10 feet on uneven surfaces activity did not occur: Safety/medical concerns         Wheelchair     Assist Will patient use wheelchair at discharge?: Yes Type of Wheelchair: Power    Wheelchair assist level: Moderate Assistance - Patient 50 - 74% Max wheelchair distance: 200    Wheelchair 50 feet  with 2 turns activity    Assist        Assist Level: Moderate Assistance - Patient 50 - 74%   Wheelchair 150 feet activity     Assist      Assist Level: Moderate Assistance - Patient 50 - 74%   Blood pressure (!) 106/92, pulse 73, temperature 98.3 F (36.8 C), temperature source Oral, resp. rate 18, height 5' (1.524 m), weight 56.6 kg, SpO2 99 %.   Medical Problem List and Plan: 1.  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  Antithrombotics: -DVT/anticoagulation: Eliquis 5 mg twice daily, d/ced due to GI bleed and increasing hematoma, Neuro aware.    Lower extremity Dopplers negative             -antiplatelet therapy: Aspirin 81 mg daily 3. Pain Management: Continue Hydrocodone as needed             2/7: does have some pain in rash in lower extremities. She has been using hydrocodone twice per day- says she uses it once per day at home. Says she uses it for diffuse arthitic pains. She has not tried voltaren gel but is willing to.   2/8: diclofenac gel helped, continue  2/9: has not required Norco in 2 days! Continue diclofenac gel   2/13: mentation appears much improved. Suspect that norco was the main culprit. Pt says she feels better also. Continue to observe 4. Mood: Cymbalta 60 mg daily, Wellbutrin 150 mg daily, Ativan 0.5 mg every 8 hours as needed- d/ced d/t AMS             -antipsychotic agents: N/A 5. Neuropsych: This patient is capable of making decisions on her own behalf. 6. Skin/Wound Care: Routine skin checks 7. Fluids/Electrolytes/Nutrition: Routine in and outs.   8.  History of PAF/CAD/PFO.  30-day cardiac event monitor per cardiology services Dr.Kardie Tobb.  Continue Eliquis for now.  If EF improves and no atrial fibrillation in 3 months plan to stop Eliquis and restart Plavix.             Monitor with increased activity 9.  Transaminitis.  Follow-up gastroenterology service  Dr. Marina Goodell.               LFTs elevated on 1/31, AST and ALT trending downward on 2/7, repeat CMP ordered for 2/14.  10.  Hyperlipidemia.  Crestor on hold due to elevated LFTs. 11.  Rheumatoid arthritis: Low-dose prednisone as well as weekly Orencia (was on Ansid at home flurbiprofen)             Having rheumatoid flare  20mg  prednisone x 2d,  reduce to 10mg  on 2/3 and 5mg  on 2/85may have caused gastritis 12.  Diabetes mellitus.  Hemoglobin A1c 6.1.  SSI.            CBG (last 3)  Recent Labs    05/11/20 1619 05/11/20 2145 05/12/20 0532  GLUCAP 168* 157* 145*   2/8: CBGs 118-159: increase metformin with lunch to 500mg . Will maintain  Toprol since still tachy.   2/13: CBGs under bordereline but improved control--no changes today 13.  Hypothyroidism: Synthroid 14.  Essential hypertension.. Blood pressure had remained a bit soft Toprol-XL resumed at 12.5 mg daily 04/26/2020   Vitals:   05/11/20 2023 05/12/20 0451  BP: 121/77 (!) 106/92  Pulse: 63 73  Resp: 18 18  Temp: 98.4 F (36.9 C) 98.3 F (36.8 C)  SpO2: 93% 99%   BP's more robust,  continue Toprol for rate control.  15.  Constipation: multiple stools post sorbitol 1/29 16. Sleep disturbance: melatonin 3mg  started HS  Improving 17.  GI bleed with drop in Hgb, suspect lower GI bleed given bright red blood  Hb 10.1 on 2/5, cont to monitor 18. Tachycardia: continue Toprol.  19. Confusion:   2/9:  Head CT was done yesterday and showed no acute abnormality.   2/13: mental status appears improved   -AMS likely d/t norco in setting of stroke   -UA was equivocal. ucx negative:    - keflex for 3 days total--completed.       LOS: 16 days A FACE TO FACE EVALUATION WAS PERFORMED  3/13 05/12/2020, 9:20 AM

## 2020-05-12 NOTE — Progress Notes (Signed)
Patient had a large vomiting episode of undigested food after dinner. Patient reported later having had "upset stomach all day". None reported prior to this episode. Vitals taken. MD Riley Kill notified and new order received. Continue to monitor.

## 2020-05-12 NOTE — Progress Notes (Signed)
Occupational Therapy Session Note  Patient Details  Name: Dustina Scoggin MRN: [id number] Date of Birth: [birth year]  Today's Date: 05/12/2020 OT Individual Time: 9675-9163 OT Individual Time Calculation (min): 60 min    Short Term Goals: Week 3:  OT Short Term Goal 1 (Week 3): Continue working on established short term goals set a min assist overall.  Skilled Therapeutic Interventions/Progress Updates:    Patient in bed, alert and able to state today's date.  Supine to sitting edge of bed with CS.  Sit to stand and short distance ambulation with RW to/from bed and w/c with CGA - occ cues for hand placement with attempts to stand and occ min A from low w/c surface.  She completed oral care and washing face in stance at sink with CS and set up.  She was able to gather clothing w/c level with min A.  She transferred back to bed for dressing tasks.  Able to doff clothing using dressing stick with min A.  She donned underwear with CS/CGA for CM.  OH dress min a.  Slip on shoes with set up.  C/o increased itching/pain in bilateral lower legs due to healing rash - MD and nursing alerted.  Completed valentines day card activity independently.  Completed 2 short walks with RW CGA.  She remained seated in w/c at close of session, seat belt alarm set and call bell in hand.    Therapy Documentation Precautions:  Precautions Precautions: Fall Precaution Comments: EF 20-25%, L hemiparesis, RA (hands, shoulders, and ankles) Restrictions Weight Bearing Restrictions: No   Therapy/Group: Individual Therapy  Barrie Lyme 05/12/2020, 7:28 AM

## 2020-05-12 NOTE — Plan of Care (Signed)
  Problem: Consults Goal: RH STROKE PATIENT EDUCATION Description: See Patient Education module for education specifics  Outcome: Progressing Goal: Nutrition Consult-if indicated Outcome: Progressing Goal: Diabetes Guidelines if Diabetic/Glucose > 140 Description: If diabetic or lab glucose is > 140 mg/dl - Initiate Diabetes/Hyperglycemia Guidelines & Document Interventions  Outcome: Progressing   Problem: RH BOWEL ELIMINATION Goal: RH STG MANAGE BOWEL WITH ASSISTANCE Description: STG Manage Bowel with supervision. Outcome: Progressing Goal: RH STG MANAGE BOWEL W/MEDICATION W/ASSISTANCE Description: STG Manage Bowel with Medication with mod I Outcome: Progressing   Problem: RH BLADDER ELIMINATION Goal: RH STG MANAGE BLADDER WITH ASSISTANCE Description: STG Manage Bladder With min Assistance Outcome: Progressing   Problem: RH SKIN INTEGRITY Goal: RH STG SKIN FREE OF INFECTION/BREAKDOWN Description: Min assist Outcome: Progressing Goal: RH STG MAINTAIN SKIN INTEGRITY WITH ASSISTANCE Description: STG Maintain Skin Integrity With min Assistance. Outcome: Progressing   Problem: RH SAFETY Goal: RH STG ADHERE TO SAFETY PRECAUTIONS W/ASSISTANCE/DEVICE Description: STG Adhere to Safety Precautions With min Assistance/Device. Outcome: Progressing Goal: RH STG DECREASED RISK OF FALL WITH ASSISTANCE Description: STG Decreased Risk of Fall With min Assistance. Outcome: Progressing   Problem: RH COGNITION-NURSING Goal: RH STG USES MEMORY AIDS/STRATEGIES W/ASSIST TO PROBLEM SOLVE Description: STG Uses Memory Aids/Strategies With supervision Assistance to Problem Solve. Outcome: Progressing Goal: RH STG ANTICIPATES NEEDS/CALLS FOR ASSIST W/ASSIST/CUES Description: STG Anticipates Needs/Calls for Assist With Assistance/Cues. Outcome: Progressing   Problem: RH PAIN MANAGEMENT Goal: RH STG PAIN MANAGED AT OR BELOW PT'S PAIN GOAL Description: Pain less than or equal to 2. Outcome:  Progressing   Problem: RH KNOWLEDGE DEFICIT Goal: RH STG INCREASE KNOWLEDGE OF DIABETES Description: Patient and spouse will be able to manage DM with medications and dietary modifications using handouts and educational materials independently Outcome: Progressing Goal: RH STG INCREASE KNOWLEDGE OF HYPERTENSION Description: Patient and spouse will be able to manage HTN with medications and dietary modifications using handouts and educational materials independently Outcome: Progressing Goal: RH STG INCREASE KNOWLEGDE OF HYPERLIPIDEMIA Description: Patient and spouse will be able to manage HLD with medications and dietary modifications using handouts and educational materials independently Outcome: Progressing Goal: RH STG INCREASE KNOWLEDGE OF STROKE PROPHYLAXIS Outcome: Progressing   Problem: RH Pre-functional/Other (Specify) Goal: RH LTG Pre-functional (Specify) Outcome: Progressing Goal: RH LTG Interdisciplinary (Specify) 1 Description: RH LTG Interdisciplinary (Specify)1 Outcome: Progressing Goal: RH LTG Interdisciplinary (Specify) 2 Description: RH LTG Interdisciplinary (Specify) 2  Outcome: Progressing

## 2020-05-13 LAB — COMPREHENSIVE METABOLIC PANEL
ALT: 101 U/L — ABNORMAL HIGH (ref 0–44)
AST: 67 U/L — ABNORMAL HIGH (ref 15–41)
Albumin: 2.7 g/dL — ABNORMAL LOW (ref 3.5–5.0)
Alkaline Phosphatase: 120 U/L (ref 38–126)
Anion gap: 14 (ref 5–15)
BUN: 12 mg/dL (ref 6–20)
CO2: 23 mmol/L (ref 22–32)
Calcium: 8.6 mg/dL — ABNORMAL LOW (ref 8.9–10.3)
Chloride: 92 mmol/L — ABNORMAL LOW (ref 98–111)
Creatinine, Ser: 0.67 mg/dL (ref 0.44–1.00)
GFR, Estimated: >60 mL/min (ref >60)
Glucose, Bld: 146 mg/dL — ABNORMAL HIGH (ref 70–99)
Potassium: 3.9 mmol/L (ref 3.5–5.1)
Sodium: 129 mmol/L — ABNORMAL LOW (ref 135–145)
Total Bilirubin: 1.1 mg/dL (ref 0.3–1.2)
Total Protein: 5.1 g/dL — ABNORMAL LOW (ref 6.5–8.1)

## 2020-05-13 LAB — GLUCOSE, CAPILLARY
Glucose-Capillary: 144 mg/dL — ABNORMAL HIGH (ref 70–99)
Glucose-Capillary: 174 mg/dL — ABNORMAL HIGH (ref 70–99)
Glucose-Capillary: 195 mg/dL — ABNORMAL HIGH (ref 70–99)
Glucose-Capillary: 172 mg/dL — ABNORMAL HIGH (ref 70–99)

## 2020-05-13 NOTE — Progress Notes (Signed)
Physical Therapy Session Note  Patient Details  Name: Madison Hurst MRN: [id number] Date of Birth: [birth year]  Today's Date: 05/13/2020 PT Individual Time: 1030-1200 PT Individual Time Calculation (min): 90 min   Short Term Goals: Week 3:  PT Short Term Goal 1 (Week 3): = to LTGs based on ELOS  Skilled Therapeutic Interventions/Progress Updates:    Patient received sitting up in bed, agreeable to PT and wc eval. ATP present in room. Patient denies pain. She appears very diaphoretic with soaked hair, but vital signs WNL and no temperature noted. Patient able to come sit edge of bed with supervision and HOB elevated. Patient able to brush hair seated unsupported with close supervision and no LOB noted, though posterior lean present. Patient transferring to lowered hemi-height wc via stand pivot with MinA. She was able to propel wc using B LE and support from B UE intermittently for steering with CGA and verbal cues for sequencing. Patient very inefficient using extremities and maintains very slow pace. She would benefit from powerwc to assist with efficiency and independence with functional mobility at a wc level. ATP completing wc eval. Patient practicing wc mobility in home-simulated environment with MinA. Patient able to complete Kinetron 5x85min with BLE at 30cm/s. She returned to room in wc, seat belt alarm on, call light within reach, husband at bedside.   Therapy Documentation Precautions:  Precautions Precautions: Fall Precaution Comments: EF 20-25%, L hemiparesis, RA (hands, shoulders, and ankles) Restrictions Weight Bearing Restrictions: No    Therapy/Group: Individual Therapy  Elizebeth Koller, PT, DPT, CBIS  05/13/2020, 7:36 AM

## 2020-05-13 NOTE — Progress Notes (Signed)
[facility] PHYSICAL MEDICINE & REHABILITATION PROGRESS NOTE   Subjective/Complaints:  No issues overnight , the pt stays warm but this is chronic, no joint pain, discussed D/C  ROS: Patient denies CP, SOB, N/V/D Objective:   No results found. No results for input(s): WBC, HGB, HCT, PLT in the last 72 hours. Recent Labs    05/13/20 0458  NA 129*  K 3.9  CL 92*  CO2 23  GLUCOSE 146*  BUN 12  CREATININE 0.67  CALCIUM 8.6*   No intake or output data in the 24 hours ending 05/13/20 0906      Physical Exam: Vital Signs Blood pressure 101/70, pulse (!) 108, temperature 98.1 F (36.7 C), resp. rate 20, height 5' (1.524 m), weight 56.6 kg, SpO2 94 %.  General: No acute distress Mood and affect are appropriate Heart: Regular rate and rhythm no rubs murmurs or extra sounds Lungs: Clear to auscultation, breathing unlabored, no rales or wheezes Abdomen: Positive bowel sounds, soft nontender to palpation, nondistended Extremities: No clubbing, cyanosis, or edema Skin: No evidence of breakdown, no evidence of rash  Musc: No edema in extremities.  Left foot, ankle sl tender Neuro: Alert, oriented to person, place, date/month. Follows commands. Reasonable insight Motor: 4+/5 RUE and RLE and 4/5 LUE and LLE  Assessment/Plan: 1. Functional deficits which require 3+ hours per day of interdisciplinary therapy in a comprehensive inpatient rehab setting.  Physiatrist is providing close team supervision and 24 hour management of active medical problems listed below.  Physiatrist and rehab team continue to assess barriers to discharge/monitor patient progress toward functional and medical goals  Care Tool:  Bathing    Body parts bathed by patient: Right arm,Left arm,Chest,Abdomen,Front perineal area,Right upper leg,Left upper leg,Face,Right lower leg,Left lower leg   Body parts bathed by helper: Buttocks,Right lower leg,Left lower leg     Bathing assist Assist Level: Minimal  Assistance - Patient > 75%     Upper Body Dressing/Undressing Upper body dressing   What is the patient wearing?: Pull over shirt    Upper body assist Assist Level: Minimal Assistance - Patient > 75%    Lower Body Dressing/Undressing Lower body dressing      What is the patient wearing?: Pants,Incontinence brief     Lower body assist Assist for lower body dressing: Minimal Assistance - Patient > 75%     Toileting Toileting    Toileting assist Assist for toileting: Maximal Assistance - Patient 25 - 49%     Transfers Chair/bed transfer  Transfers assist     Chair/bed transfer assist level: Minimal Assistance - Patient > 75% Chair/bed transfer assistive device: Geologist, engineering   Ambulation assist      Assist level: Minimal Assistance - Patient > 75% Assistive device: Walker-rolling Max distance: 72ft   Walk 10 feet activity   Assist     Assist level: Minimal Assistance - Patient > 75% Assistive device: Walker-rolling   Walk 50 feet activity   Assist Walk 50 feet with 2 turns activity did not occur: Safety/medical concerns         Walk 150 feet activity   Assist Walk 150 feet activity did not occur: N/A (pt does not ambulate this distance at basline)         Walk 10 feet on uneven surface  activity   Assist Walk 10 feet on uneven surfaces activity did not occur: Safety/medical concerns         Wheelchair     Assist  Will patient use wheelchair at discharge?: Yes Type of Wheelchair: Power    Wheelchair assist level: Moderate Assistance - Patient 50 - 74% Max wheelchair distance: 200    Wheelchair 50 feet with 2 turns activity    Assist        Assist Level: Moderate Assistance - Patient 50 - 74%   Wheelchair 150 feet activity     Assist      Assist Level: Moderate Assistance - Patient 50 - 74%   Blood pressure 101/70, pulse (!) 108, temperature 98.1 F (36.7 C), resp. rate 20, height 5'  (1.524 m), weight 56.6 kg, SpO2 94 %.   Medical Problem List and Plan: 1.  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  Antithrombotics: -DVT/anticoagulation: Eliquis 5 mg twice daily, d/ced due to GI bleed and increasing hematoma, Neuro aware.    Lower extremity Dopplers negative             -antiplatelet therapy: Aspirin 81 mg daily 3. Pain Management: Continue Hydrocodone as needed             2/7: does have some pain in rash in lower extremities. She has been using hydrocodone twice per day- says she uses it once per day at home. Says she uses it for diffuse arthitic pains. She has not tried voltaren gel but is willing to.   2/8: diclofenac gel helped, continue  2/9: has not required Norco in 2 days! Continue diclofenac gel   2/13: mentation appears much improved. Suspect that norco was the main culprit. Pt says she feels better also. Continue to observe 4. Mood: Cymbalta 60 mg daily, Wellbutrin 150 mg daily, Ativan 0.5 mg every 8 hours as needed- d/ced d/t AMS             -antipsychotic agents: N/A 5. Neuropsych: This patient is capable of making decisions on her own behalf. 6. Skin/Wound Care: Routine skin checks 7. Fluids/Electrolytes/Nutrition: Routine in and outs.   8.  History of PAF/CAD/PFO.  30-day cardiac event monitor per cardiology services Dr.Kardie Tobb.  Continue Eliquis for now.  If EF improves and no atrial fibrillation in 3 months plan to stop Eliquis and restart Plavix.             Monitor with increased activity 9.  Transaminitis.  Follow-up gastroenterology service Dr. Marina Goodell.               LFTs elevated on 1/31, AST and ALT trending downward on 2/7, repeat CMP ordered for 2/14 show further improvement in AST and ALT  10.  Hyperlipidemia.  Crestor on hold due to elevated LFTs. 11.  Rheumatoid arthritis: Low-dose prednisone as well as weekly Orencia (was on Ansid  at home flurbiprofen)             Having rheumatoid flare  20mg  prednisone x 2d,  reduce to 10mg  on 2/3 and 5mg  on 2/75may have caused gastritis 12.  Diabetes mellitus.  Hemoglobin A1c 6.1.  SSI.            CBG (last 3)  Recent Labs    05/12/20 1627 05/12/20 2121 05/13/20 0603  GLUCAP 223* 179* 144*     2/13: CBGs still labile but improved control--no changes today 13.  Hypothyroidism: Synthroid 14.  Essential hypertension.. Blood pressure had remained a bit soft Toprol-XL resumed at 12.5 mg daily 04/26/2020   Vitals:  05/12/20 1930 05/13/20 0357  BP: 112/66 101/70  Pulse: 100 (!) 108  Resp: 18 20  Temp: 97.9 F (36.6 C) 98.1 F (36.7 C)  SpO2: (!) 89% 94%   BP's more robust,  continue Toprol for rate control. Monitor for symptomatic hypotension  15.  Constipation: multiple stools post sorbitol 1/29 16. Sleep disturbance: melatonin 3mg  started HS  Improving 17.  GI bleed with drop in Hgb, suspect lower GI bleed given bright red blood  Hb 10.1 on 2/5, cont to monitor 18. Tachycardia: continue Toprol.  19. Confusion:   Resolved      20.  Hypo Na+ no obvious meds, fluid intake is not excessive , no IVF  , repeat in am  LOS: 17 days A FACE TO FACE EVALUATION WAS PERFORMED  05/13/2020, 9:06 AM

## 2020-05-13 NOTE — Progress Notes (Signed)
Patient ID: Madison Hurst, female   DOB: June 18, 1960, 60 y.o.   MRN: [id number]  This SW covering for primary SW [employer].   SW left message for pt husband Scottie to inform no changes in his discharge date, and d/c date remains 2/17. SW informed assigned SW will return tomorrow, and will follow-up.   Cecile Sheerer, MSW, LCSWA Office: [phone] Cell: [phone] Fax: [phone]

## 2020-05-13 NOTE — Plan of Care (Signed)
  Problem: RH Problem Solving Goal: LTG Patient will demonstrate problem solving for (SLP) Description: LTG:  Patient will demonstrate problem solving for basic/complex daily situations with cues  (SLP) Flowsheets (Taken 05/13/2020 1205) LTG Patient will demonstrate problem solving for: Minimal Assistance - Patient > 75% Note: Downgraded due to slower than anticipated progress   Problem: RH Memory Goal: LTG Patient will use memory compensatory aids to (SLP) Description: LTG:  Patient will use memory compensatory aids to recall biographical/new, daily complex information with cues (SLP) Note: Downgraded due to slower than anticipated progress   Problem: RH Awareness Goal: LTG: Patient will demonstrate awareness during functional activites type of (SLP) Description: LTG: Patient will demonstrate awareness during functional activites type of (SLP) Flowsheets (Taken 05/13/2020 1205) LTG: Patient will demonstrate awareness during cognitive/linguistic activities with assistance of (SLP): Minimal Assistance - Patient > 75% Note: Downgraded due to slower than anticipated progress

## 2020-05-13 NOTE — Progress Notes (Signed)
Speech Language Pathology Daily Session Note  Patient Details  Name: Madison Hurst MRN: [id number] Date of Birth: Aug 28, 1960  Today's Date: 05/13/2020 SLP Individual Time: 0160-1093 SLP Individual Time Calculation (min): 55 min  Short Term Goals: Week 2: SLP Short Term Goal 1 (Week 2): STGs=LTGs due to ELOS  Skilled Therapeutic Interventions: Pt was seen for skilled ST targeting cognitive goals. SLP facilitated session with overall Min A verbal cues for use of strategies to recall details of family education/training with husband last week, as well as activities from the morning. Pt was more accurate with her daily recall this session than ever before. She was also independently oriented to date. Pt completed a complex checkbook balancing activity with overall Min A verbal and visual cueing for error awareness, problem solving, and organization. Although she still requires extra time to complete tasks, her problem solving abilities also appear to be steadily improving based on performance across last 2 ST sessions. She demonstrated good awareness of her progress and also areas she would still like to improve related to cognitive functioning. Pt left sitting up in bed with alarm set and needs within reach. Continue per current plan of care.          Pain Pain Assessment Pain Scale: 0-10 Pain Score: 0-No pain  Therapy/Group: Individual Therapy  [name] 05/13/2020, 7:23 AM

## 2020-05-13 NOTE — Plan of Care (Signed)
  Problem: RH BOWEL ELIMINATION Goal: RH STG MANAGE BOWEL WITH ASSISTANCE Description: STG Manage Bowel with supervision. Outcome: Progressing   Problem: RH BLADDER ELIMINATION Goal: RH STG MANAGE BLADDER WITH ASSISTANCE Description: STG Manage Bladder With min Assistance Outcome: Progressing   Problem: RH SKIN INTEGRITY Goal: RH STG SKIN FREE OF INFECTION/BREAKDOWN Description: Min assist Outcome: Progressing Goal: RH STG MAINTAIN SKIN INTEGRITY WITH ASSISTANCE Description: STG Maintain Skin Integrity With min Assistance. Outcome: Progressing   Problem: RH SAFETY Goal: RH STG ADHERE TO SAFETY PRECAUTIONS W/ASSISTANCE/DEVICE Description: STG Adhere to Safety Precautions With min Assistance/Device. Outcome: Progressing   Problem: RH COGNITION-NURSING Goal: RH STG ANTICIPATES NEEDS/CALLS FOR ASSIST W/ASSIST/CUES Description: STG Anticipates Needs/Calls for Assist With Assistance/Cues. Outcome: Progressing   Problem: RH PAIN MANAGEMENT Goal: RH STG PAIN MANAGED AT OR BELOW PT'S PAIN GOAL Description: Pain less than or equal to 2. Outcome: Progressing

## 2020-05-13 NOTE — Progress Notes (Signed)
Occupational Therapy Session Note  Patient Details  Name: Madison Hurst MRN: [id number] Date of Birth: 06/15/1960  Today's Date: 05/13/2020 OT Individual Time: 7829-5621 OT Individual Time Calculation (min): 53 min    Short Term Goals: Week 3:  OT Short Term Goal 1 (Week 3): Continue working on established short term goals set a min assist overall.  Skilled Therapeutic Interventions/Progress Updates:    Pt in bed to start session.  Disoriented as to time, stating she was going home Monday.  Therapist re-directed her as to what day of the week it was and then she was going to be discharging.  She was able to complete supine to sit with min assist in preparation for showering task.  She then ambulated with the RW and overall mod assist secondary to LOB posteriorly to the 3:1 in the shower.  Max assist was needed for removing gripper socks with supervision for pullover gown.  She completed bathing in sitting with overall min assist including use of the LH sponge for washing her lower legs and feet.  Therapist assisted with washing her hair as with her limited AROM in her shoulders, she cannot wash the back.  She then completed drying off with min assist and transferred out to the EOB for dressing tasks.  Mod assist was needed for donning a pullover shirt with min instructional cueing to place her UEs in it first and then pull it over her head.  She next completed LB dressing with mod assist sit to stand.  She continues to demonstrate increased difficulty pulling items over her hips.  Finished session with transfer to supine with min facilitation and resting hand splint placed on the RUE to assess for fit  Therapy Documentation Precautions:  Precautions Precautions: Fall Precaution Comments: EF 20-25%, L hemiparesis, RA (hands, shoulders, and ankles) Restrictions Weight Bearing Restrictions: No  Pain: Pain Assessment Pain Scale: Faces Faces Pain Scale: Hurts a little bit Pain  Type: Acute pain Pain Location: Hand Pain Orientation: Lower;Right Pain Descriptors / Indicators: Discomfort Pain Onset: On-going Pain Intervention(s): Repositioned;Splinting ADL: See Care Tool Section for some details of mobility and selfcare  Therapy/Group: Individual Therapy  Aalayah Riles OTR/L 05/13/2020, 4:10 PM

## 2020-05-14 LAB — BASIC METABOLIC PANEL
Anion gap: 16 — ABNORMAL HIGH (ref 5–15)
BUN: 14 mg/dL (ref 6–20)
CO2: 22 mmol/L (ref 22–32)
Calcium: 9.1 mg/dL (ref 8.9–10.3)
Chloride: 92 mmol/L — ABNORMAL LOW (ref 98–111)
Creatinine, Ser: 0.8 mg/dL (ref 0.44–1.00)
GFR, Estimated: >60 mL/min (ref >60)
Glucose, Bld: 145 mg/dL — ABNORMAL HIGH (ref 70–99)
Potassium: 4 mmol/L (ref 3.5–5.1)
Sodium: 130 mmol/L — ABNORMAL LOW (ref 135–145)

## 2020-05-14 LAB — CBC
HCT: 31.7 % — ABNORMAL LOW (ref 36.0–46.0)
Hemoglobin: 9.8 g/dL — ABNORMAL LOW (ref 12.0–15.0)
MCH: 33.4 pg (ref 26.0–34.0)
MCHC: 30.9 g/dL (ref 30.0–36.0)
MCV: 108.2 fL — ABNORMAL HIGH (ref 80.0–100.0)
Platelets: 532 10*3/uL — ABNORMAL HIGH (ref 150–400)
RBC: 2.93 MIL/uL — ABNORMAL LOW (ref 3.87–5.11)
RDW: 23.6 % — ABNORMAL HIGH (ref 11.5–15.5)
WBC: 11.7 10*3/uL — ABNORMAL HIGH (ref 4.0–10.5)
nRBC: 0.7 % — ABNORMAL HIGH (ref 0.0–0.2)

## 2020-05-14 LAB — GLUCOSE, CAPILLARY
Glucose-Capillary: 135 mg/dL — ABNORMAL HIGH (ref 70–99)
Glucose-Capillary: 191 mg/dL — ABNORMAL HIGH (ref 70–99)
Glucose-Capillary: 189 mg/dL — ABNORMAL HIGH (ref 70–99)
Glucose-Capillary: 154 mg/dL — ABNORMAL HIGH (ref 70–99)

## 2020-05-14 NOTE — Progress Notes (Signed)
Physical Therapy Session Note  Patient Details  Name: Madison Hurst MRN: [id number] Date of Birth: 1961-03-13  Today's Date: 05/14/2020 PT Individual Time: 3704-8889 PT Individual Time Calculation (min): 55 min   Short Term Goals: Week 3:  PT Short Term Goal 1 (Week 3): = to LTGs based on ELOS  Skilled Therapeutic Interventions/Progress Updates:    Patient received supine in bed, agreeable to PT. She denies pain, but reports "achiness" in her joints affected by RA. Patient appearing sweaty again, she reports "I've never been able to regulate my temperature well." Patient oriented, but lethargic. She was able to come sit edge of bed with CGA and verbal cues. Patient donning pants with ModA. She required ModA to stand up initially due to posterior lean, but this progressed throughout the session to MinA. She ambulated ~78ft to powerwc with MinA and RW, verbal cues for anterior weight shift throughout gait to prevent posterior LOB. Patient required ModA consistently to steer powerwc safely and appropriately. 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 Patient able to maintain straight path and avoid obstacles appropriately with verbal cues for maneuvering tight spaces. She will likely perform much better with appropriately sized chair and mount on the R, as well as with increased practice. Patient returning to bed at end of session, bed alarm on, call light within reach.   Therapy Documentation Precautions:  Precautions Precautions: Fall Precaution Comments: EF 20-25%, L hemiparesis, RA (hands, shoulders, and ankles) Restrictions Weight Bearing Restrictions: No    Therapy/Group: Individual Therapy  Elizebeth Koller, PT, DPT, CBIS  05/14/2020, 7:43 AM

## 2020-05-14 NOTE — Progress Notes (Signed)
Occupational Therapy Session Note  Patient Details  Name: Madison Hurst MRN: [id number] Date of Birth: 14-Jul-1960  Today's Date: 05/14/2020 OT Individual Time: 7322-0254 OT Individual Time Calculation (min): 61 min    Short Term Goals: Week 3:  OT Short Term Goal 1 (Week 3): Continue working on established short term goals set a min assist overall.  Skilled Therapeutic Interventions/Progress Updates:    Pt in bed to start session.  When therapist told her good morning, she stated "It is?" not realizing that it was in the Morning.  She was oriented to day of the week but not month when asked stating "October".  When asked if she had other therapy today, she stated no and did not recall PT earlier when she practiced with her power wheelchair.  She needed min assist for transition to sitting from the EOB with mod assist for threading and donning pull up jogging pants.  Increased posterior lean with standing requiring mod assist for balance.  Had her transfer to the power chair with mod assist using the RW for support.  She then drove over to the sink for grooming tasks of brushing her hair and her teeth.  She needed mod assist for positioning of the wheelchair as she rammed it into the sink and did not let off of the controller immediately.  She was able to complete combing her hair with min assist using built up handle comb.  She then needed only supervision for oral hygiene.  Pt's spouse came in at conclusion of this and he was educated on use of the resting hand splints with demonstration of how to correctly donn the one on the left hand.  She was able to wear it for 15 mins without any redness or pressure areas noted.  Finished session with pt positioned in the power wheelchair beside of the bed in preparation for lunch.  She was noted throughout part of session closing her eyes and falling asleep, requiring mod instructional cueing for sustained attention when therapist was discussing  about her hand splint.  Call button in reach with safety belt in place.    Therapy Documentation Precautions:  Precautions Precautions: Fall Precaution Comments: EF 20-25%, L hemiparesis, RA (hands, shoulders, and ankles) Restrictions Weight Bearing Restrictions: No Pain: Pain Assessment Pain Scale: Faces Pain Score: 0-No pain ADL: See Care Tool Section for some details of mobility and selfcare  Therapy/Group: Individual Therapy  Lin Hackmann OTR/L 05/14/2020, 12:59 PM

## 2020-05-14 NOTE — Progress Notes (Signed)
Speech Language Pathology Daily Session Note  Patient Details  Name: Madison Hurst MRN: [id number] Date of Birth: 07/11/1960  Today's Date: 05/14/2020 SLP Individual Time: 1300-1355 SLP Individual Time Calculation (min): 55 min  Short Term Goals: Week 2: SLP Short Term Goal 1 (Week 2): STGs=LTGs due to ELOS  Skilled Therapeutic Interventions: Pt was seen for skilled ST targeting cognitive goals. Pt's husband was present to observe session today. Pt completed a semi-complex 4-step action card sequencing task with overall Supervision A verbal cues for problem solving and error awareness. She also recalled a familiar semi-complex card task with ~75% accuracy with Min A verbal and visual cues. She then executed the card task with overall Min A verbal and visual cues for problem solving and recall within task. At end of session, pt stated, "I think I'll go outside." SLP and husband reoriented pt to situation and the fact that she is unable to ambulate safely without assistance and would be unable to go outside unless targeted in PT session. Pt agreeable. Pt left laying in bed with alarm set and needs within reach. Continue per current plan of care.       Pain Pain Assessment Pain Scale: 0-10 Pain Score: 0-No pain  Therapy/Group: Individual Therapy  Madison Hurst 05/14/2020, 7:23 AM

## 2020-05-14 NOTE — Discharge Summary (Signed)
Physician Discharge Summary  Patient ID: Madison Hurst MRN: [id number] DOB/AGE: [birth year] 60 y.o.  Admit date: 04/26/2020 Discharge date: 05/16/2020  Discharge Diagnoses:  Principal Problem:   Right middle cerebral artery stroke [facility]) Active Problems:   Depression with anxiety   Acute GI bleeding   Sleep disturbance   Labile blood glucose   Benign essential HTN   Hypoglycemia   Diabetes mellitus type 2 in nonobese [facility]) PAF/CAD/PFO Transaminitis Hyperlipidemia Rheumatoid arthritis      Discharged Condition: Stable  Significant Diagnostic Studies: CT Code Stroke CTA Head W/WO contrast  Result Date: 04/22/2020 CLINICAL DATA:  Worsening left-sided weakness for 2 weeks. Recent TIA. Fall today. Slurred speech. EXAM: CT ANGIOGRAPHY HEAD AND NECK TECHNIQUE: Multidetector CT imaging of the head and neck was performed using the standard protocol during bolus administration of intravenous contrast. Multiplanar CT image reconstructions and MIPs were obtained to evaluate the vascular anatomy. Carotid stenosis measurements (when applicable) are obtained utilizing NASCET criteria, using the distal internal carotid diameter as the denominator. CONTRAST:  75mL OMNIPAQUE IOHEXOL 350 MG/ML SOLN COMPARISON:  Brain MRI 04/22/2020 Head CT 04/22/2020 FINDINGS: CTA NECK FINDINGS SKELETON: There is no bony spinal canal stenosis. No lytic or blastic lesion. OTHER NECK: Normal pharynx, larynx and major salivary glands. No cervical lymphadenopathy. Enlarged thyroid gland. UPPER CHEST: Small left pleural effusion with fluid in the fissure. AORTIC ARCH: There is no calcific atherosclerosis of the aortic arch. There is no aneurysm, dissection or hemodynamically significant stenosis of the visualized portion of the aorta. Conventional 3 vessel aortic branching pattern. The visualized proximal subclavian arteries are widely patent. RIGHT CAROTID SYSTEM: Normal without aneurysm, dissection or  stenosis. LEFT CAROTID SYSTEM: No dissection, occlusion or aneurysm. Mild atherosclerotic calcification at the carotid bifurcation without hemodynamically significant stenosis. VERTEBRAL ARTERIES: Codominant configuration. Both origins are clearly patent. There is no dissection, occlusion or flow-limiting stenosis to the skull base (V1-V3 segments). CTA HEAD FINDINGS POSTERIOR CIRCULATION: --Vertebral arteries: Normal V4 segments. --Inferior cerebellar arteries: Normal. --Basilar artery: Normal. --Superior cerebellar arteries: Normal. --Posterior cerebral arteries (PCA): Severe stenosis of the right PCA P3 segment over a length 7 mm (12:17). The distal right PCA is normal. Mild stenosis of the left P3 segment in a similar location (12:24). ANTERIOR CIRCULATION: --Intracranial internal carotid arteries: Normal. --Anterior cerebral arteries (ACA): Normal. Hypoplastic right A1 segment, normal variant. --Middle cerebral arteries (MCA): Normal. VENOUS SINUSES: As permitted by contrast timing, patent. Incidentally noted gas in the cavernous sinuses related to contrast injection. ANATOMIC VARIANTS: None Review of the MIP images confirms the above findings. IMPRESSION: 1. No emergent large vessel occlusion. 2. 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 3. Mild stenosis of the left PCA P3 segment. 4. Small left pleural effusion with fluid in the fissure. 5. Enlarged thyroid gland. This has been evaluated on previous imaging. (ref: J Am Coll Radiol. 2015 Feb;12(2): 143-50). Electronically Signed   By: Deatra Robinson M.D.   On: 04/22/2020 20:14   CT HEAD WO CONTRAST  Result Date: 05/07/2020 CLINICAL DATA:  Suspected head injury EXAM: CT HEAD WITHOUT CONTRAST TECHNIQUE: Contiguous axial images were obtained from the base of the skull through the vertex without intravenous contrast. COMPARISON:  04/22/2020 FINDINGS: Brain: There is atrophy and chronic small vessel disease changes. No acute intracranial  abnormality. Specifically, no hemorrhage, hydrocephalus, mass lesion, acute infarction, or significant intracranial injury. Vascular: No hyperdense vessel or unexpected calcification. Skull: No acute calvarial abnormality. Sinuses/Orbits: Visualized paranasal sinuses  and mastoids clear. Orbital soft tissues unremarkable. Other: None IMPRESSION: Atrophy, chronic microvascular disease. No acute intracranial abnormality. Electronically Signed   By: Charlett Nose M.D.   On: 05/07/2020 20:01   CT Head Wo Contrast  Result Date: 04/22/2020 CLINICAL DATA:  Tube deficit. Progressive weakness, greater on the left. Fell today. EXAM: CT HEAD WITHOUT CONTRAST TECHNIQUE: Contiguous axial images were obtained from the base of the skull through the vertex without intravenous contrast. COMPARISON:  02/16/2020 FINDINGS: Brain: No evidence of acute infarction, hemorrhage, hydrocephalus, extra-axial collection or mass lesion/mass effect. Subcortical and periventricular white matter hypodensity most likely result of chronic ischemic small vessel disease. Similar findings can be seen with demyelinating disease of the white matter. Diffuse cortical atrophy again seen. Vascular: No hyperdense vessel or unexpected calcification. Skull: Normal. Negative for fracture or focal lesion. Sinuses/Orbits: No acute finding. Other: None. IMPRESSION: No acute intracranial abnormality. Electronically Signed   By: Acquanetta Belling M.D.   On: 04/22/2020 14:29   CT Code Stroke CTA Neck W/WO contrast  Result Date: 04/22/2020 CLINICAL DATA:  Worsening left-sided weakness for 2 weeks. Recent TIA. Fall today. Slurred speech. EXAM: CT ANGIOGRAPHY HEAD AND NECK TECHNIQUE: Multidetector CT imaging of the head and neck was performed using the standard protocol during bolus administration of intravenous contrast. Multiplanar CT image reconstructions and MIPs were obtained to evaluate the vascular anatomy. Carotid stenosis measurements (when applicable) are  obtained utilizing NASCET criteria, using the distal internal carotid diameter as the denominator. CONTRAST:  75mL OMNIPAQUE IOHEXOL 350 MG/ML SOLN COMPARISON:  Brain MRI 04/22/2020 Head CT 04/22/2020 FINDINGS: CTA NECK FINDINGS SKELETON: There is no bony spinal canal stenosis. No lytic or blastic lesion. OTHER NECK: Normal pharynx, larynx and major salivary glands. No cervical lymphadenopathy. Enlarged thyroid gland. UPPER CHEST: Small left pleural effusion with fluid in the fissure. AORTIC ARCH: There is no calcific atherosclerosis of the aortic arch. There is no aneurysm, dissection or hemodynamically significant stenosis of the visualized portion of the aorta. Conventional 3 vessel aortic branching pattern. The visualized proximal subclavian arteries are widely patent. RIGHT CAROTID SYSTEM: Normal without aneurysm, dissection or stenosis. LEFT CAROTID SYSTEM: No dissection, occlusion or aneurysm. Mild atherosclerotic calcification at the carotid bifurcation without hemodynamically significant stenosis. VERTEBRAL ARTERIES: Codominant configuration. Both origins are clearly patent. There is no dissection, occlusion or flow-limiting stenosis to the skull base (V1-V3 segments). CTA HEAD FINDINGS POSTERIOR CIRCULATION: --Vertebral arteries: Normal V4 segments. --Inferior cerebellar arteries: Normal. --Basilar artery: Normal. --Superior cerebellar arteries: Normal. --Posterior cerebral arteries (PCA): Severe stenosis of the right PCA P3 segment over a length 7 mm (12:17). The distal right PCA is normal. Mild stenosis of the left P3 segment in a similar location (12:24). ANTERIOR CIRCULATION: --Intracranial internal carotid arteries: Normal. --Anterior cerebral arteries (ACA): Normal. Hypoplastic right A1 segment, normal variant. --Middle cerebral arteries (MCA): Normal. VENOUS SINUSES: As permitted by contrast timing, patent. Incidentally noted gas in the cavernous sinuses related to contrast injection. ANATOMIC  VARIANTS: None Review of the MIP images confirms the above findings. IMPRESSION: 1. No emergent large vessel occlusion. 2. 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 3. Mild stenosis of the left PCA P3 segment. 4. Small left pleural effusion with fluid in the fissure. 5. Enlarged thyroid gland. This has been evaluated on previous imaging. (ref: J Am Coll Radiol. 2015 Feb;12(2): 143-50). Electronically Signed   By: Deatra Robinson M.D.   On: 04/22/2020 20:14   MR BRAIN WO CONTRAST  Result Date: 04/22/2020 CLINICAL DATA:  Acute neurological deficit. Progressive weakness, worse on the left. EXAM: MRI HEAD WITHOUT CONTRAST TECHNIQUE: Multiplanar, multiecho pulse sequences of the brain and surrounding structures were obtained without intravenous contrast. COMPARISON:  Head CT same day.  MRI 02/28/2020. FINDINGS: Brain: Diffusion imaging shows acute infarction affecting the right mid-posterior caudate an the superior thalamus. Single separate punctate acute infarction in the cortex of the medial right parietooccipital junction. Few small punctate acute infarctions in the right occipital cortex. 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 Elsewhere, there is chronic small vessel ischemic change affecting the hemispheric deep and subcortical white matter, right more than left. No focal insult affects the brainstem or cerebellum. No evidence of mass, hemorrhage, hydrocephalus or extra-axial collection Vascular: Major vessels at the base of the brain show flow. Skull and upper cervical spine: Negative Sinuses/Orbits: Clear/normal Other: None IMPRESSION: 1. Acute infarction affecting the right mid-posterior caudate and right superior thalamus. Single separate punctate acute infarction in the cortex of the medial right parietooccipital junction. Few small punctate acute infarctions in the right occipital cortex. 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 No  evidence of hemorrhage or mass effect. 2. Chronic small-vessel ischemic changes elsewhere throughout the brain as outlined above. Electronically Signed   By: Paulina Fusi M.D.   On: 04/22/2020 17:12   MR CERVICAL SPINE WO CONTRAST  Result Date: 04/23/2020 CLINICAL DATA:  Myelopathy, acute or progressive. Additional history provided: Patient reports worsening weakness on the left side, most notably within the last week, 3 falls in the last 2 days weakness all over, but the left-sided seems weaker than the right. EXAM: MRI CERVICAL SPINE WITHOUT CONTRAST TECHNIQUE: Multiplanar, multisequence MR imaging of the cervical spine was performed. No intravenous contrast was administered. COMPARISON:  CT head/neck 04/22/2020.  CT cervical spine 01/26/2015. FINDINGS: All sequences are moderate to severely motion degraded, significantly limiting evaluation. Alignment: Straightening of the expected cervical lordosis. No significant spondylolisthesis. Vertebrae: No significant marrow edema or focal suspicious osseous Lea is identified. Cord: Motion degradation precludes adequate evaluation for spinal cord signal abnormality. Posterior Fossa, vertebral arteries, paraspinal tissues: Cerebellar atrophy. No acute abnormality identified within included portions of the posterior fossa. Cervical vertebral artery flow voids poorly assessed due to motion degradation. No paraspinal soft tissue abnormality is identified. Disc levels: Multilevel disc degeneration. Most notably, there is moderate disc degeneration at the C4-C5, C5-C6 and C6-C7 levels. C2-C3: Disc bulge. Facet hypertrophy. No significant spinal canal stenosis. Mild bilateral neural foraminal narrowing. C3-C4: Disc bulge. Bilateral disc osteophyte ridge/uncinate hypertrophy (greater on the right). Facet and ligamentum flavum hypertrophy. Apparent moderate spinal canal stenosis. Bilateral neural foraminal narrowing (severe right, mild left). C4-C5: Posterior disc osteophyte  complex. Uncovertebral, facet and ligamentum flavum hypertrophy. Apparent moderate/severe spinal canal stenosis. Bilateral neural foraminal narrowing (moderate/severe right, moderate left). C5-C6: Posterior disc osteophyte complex. Uncovertebral, facet and ligamentum flavum hypertrophy. Apparent moderate/severe spinal canal stenosis. Bilateral neural foraminal narrowing (moderate right, moderate/severe left). C6-C7: Posterior disc osteophyte complex. Uncovertebral, facet and ligamentum flavum hypertrophy. Apparent moderate spinal canal stenosis. Bilateral neural foraminal narrowing (moderate/severe right, moderate left). C7-T1: Mild endplate spurring on the left. Mild facet hypertrophy (greater on the left). No appreciable disc herniation or spinal canal stenosis. Mild left neural foraminal narrowing. IMPRESSION: All acquired sequences are moderate to severely motion degraded, significantly limiting evaluation. A repeat examination should be considered when the patient is better able to tolerate the study. Cervical spondylosis as outlined. Most notably, there is apparent moderate/severe spinal canal  stenosis at C4-C5 and C5-C6, and apparent moderate spinal canal stenosis at C3-C4 and C6-C7. Motion degradation limits evaluation for subtle spinal cord mass effect and precludes adequate evaluation for spinal cord signal abnormality. Multilevel neural foraminal narrowing as detailed and greatest on the right at C3-C4 (severe), on the right at C4-C5 (moderate/severe), on the left at C5-C6 (moderate/severe) and on the right at C6-C7 (moderate/severe). Electronically Signed   By: Jackey Loge DO   On: 04/23/2020 12:27   DG Chest Port 1 View  Result Date: 04/22/2020 CLINICAL DATA:  Weakness EXAM: PORTABLE CHEST 1 VIEW COMPARISON:  Portable exam 1345 hours compared to 12/21/2012 FINDINGS: Upper normal heart size post median sternotomy. Mediastinal contours and pulmonary vascularity normal. Subsegmental atelectasis versus  scarring at LEFT base. Remaining lungs clear. No acute infiltrate, pleural effusion, or pneumothorax. Bones demineralized with note of a LEFT shoulder prosthesis and advanced RIGHT glenohumeral degenerative changes. IMPRESSION: Atelectasis versus scarring at LEFT base. Electronically Signed   By: Ulyses Southward M.D.   On: 04/22/2020 14:01   DG Knee Right Port  Result Date: 05/15/2020 CLINICAL DATA:  Fall with right knee. EXAM: PORTABLE RIGHT KNEE - 1-2 VIEW COMPARISON:  None. FINDINGS: No fracture. No joint effusion. No dislocation. No suspicious focal osseous lesions. No significant arthropathy. Vascular calcifications in the posterior soft tissues. No radiopaque foreign bodies. IMPRESSION: No right knee fracture, joint effusion or malalignment. Electronically Signed   By: Delbert Phenix M.D.   On: 05/15/2020 14:19   ECHOCARDIOGRAM COMPLETE  Result Date: 04/23/2020    ECHOCARDIOGRAM REPORT   Patient Name:   Madison Hurst Gonzella Lex Date of Exam: 04/23/2020 Medical Rec #:  [id number]                     Height:       60.0 in Accession #:    [id number]                    Weight:       123.0 lb Date of Birth:  [birth year]                    BSA:          1.518 m Patient Age:    59 years                      BP:           115/102 mmHg Patient Gender: F                             HR:           107 bpm. Exam Location:  Inpatient Procedure: 2D Echo and Color Doppler Indications:    Stroke 434.91 / I163.9  History:        Patient has prior history of Echocardiogram examinations, most                 recent 03/11/2020. Signs/Symptoms:Dyspnea; Risk                 Factors:Diabetes. Hypothyroidism.  Sonographer:    Tiffany Dance Referring Phys: [id number] RONDELL A SMITH IMPRESSIONS  1. Left ventricular ejection fraction, by estimation, is 20 to 25%. The left ventricle has severely decreased function. The left ventricle demonstrates global hypokinesis. Left ventricular diastolic parameters are indeterminate.  2. Right  ventricular systolic function is normal. The right  ventricular size is normal.  3. Left atrial size was moderately dilated.  4. The mitral valve is normal in structure. Mild mitral valve regurgitation. No evidence of mitral stenosis.  5. The aortic valve is tricuspid. Aortic valve regurgitation is not visualized. No aortic stenosis is present.  6. The inferior vena cava is normal in size with greater than 50% respiratory variability, suggesting right atrial pressure of 3 mmHg. FINDINGS  Left Ventricle: Left ventricular ejection fraction, by estimation, is 20 to 25%. The left ventricle has severely decreased function. The left ventricle demonstrates global hypokinesis. The left ventricular internal cavity size was normal in size. There is no left ventricular hypertrophy. Left ventricular diastolic parameters are indeterminate. Right Ventricle: The right ventricular size is normal. Right ventricular systolic function is normal. Left Atrium: Left atrial size was moderately dilated. Right Atrium: Right atrial size was normal in size. Pericardium: There is no evidence of pericardial effusion. Mitral Valve: The mitral valve is normal in structure. Mild mitral annular calcification. Mild mitral valve regurgitation. No evidence of mitral valve stenosis. Tricuspid Valve: The tricuspid valve is normal in structure. Tricuspid valve regurgitation is trivial. No evidence of tricuspid stenosis. Aortic Valve: The aortic valve is tricuspid. Aortic valve regurgitation is not visualized. No aortic stenosis is present. Pulmonic Valve: The pulmonic valve was grossly normal. Pulmonic valve regurgitation is not visualized. No evidence of pulmonic stenosis. Aorta: The aortic root is normal in size and structure. Venous: The inferior vena cava is normal in size with greater than 50% respiratory variability, suggesting right atrial pressure of 3 mmHg. IAS/Shunts: No atrial level shunt detected by color flow Doppler.  LEFT VENTRICLE PLAX 2D  LVIDd:         4.70 cm LVIDs:         4.00 cm LV PW:         1.20 cm LV IVS:        0.70 cm LVOT diam:     1.90 cm LV SV:         19 LV SV Index:   13 LVOT Area:     2.84 cm  RIGHT VENTRICLE            IVC RV Basal diam:  2.20 cm    IVC diam: 2.10 cm RV S prime:     6.42 cm/s TAPSE (M-mode): 1.2 cm LEFT ATRIUM             Index       RIGHT ATRIUM           Index LA diam:        5.10 cm 3.36 cm/m  RA Area:     12.90 cm LA Vol (A2C):   68.6 ml 45.19 ml/m RA Volume:   27.30 ml  17.98 ml/m LA Vol (A4C):   64.6 ml 42.55 ml/m LA Biplane Vol: 67.3 ml 44.33 ml/m  AORTIC VALVE LVOT Vmax:   48.75 cm/s LVOT Vmean:  30.800 cm/s LVOT VTI:    0.068 m  AORTA Ao Root diam: 2.70 cm Ao Asc diam:  3.40 cm MITRAL VALVE MV Area (PHT): 5.13 cm    SHUNTS MV Decel Time: 148 msec    Systemic VTI:  0.07 m MV E velocity: 92.40 cm/s  Systemic Diam: 1.90 cm MV A velocity: 71.10 cm/s MV E/A ratio:  1.30 Olga Millers MD Electronically signed by Olga Millers MD Signature Date/Time: 04/23/2020/2:25:22 PM    Final    ECHO TEE  Result Date: 04/24/2020  TRANSESOPHOGEAL ECHO REPORT   Patient Name:   Madison Hurst Date of Exam: 04/24/2020 Medical Rec #:  [id number]                     Height:       60.0 in Accession #:    [id number]                    Weight:       128.7 lb Date of Birth:  08-09-1960                    BSA:          1.548 m Patient Age:    59 years                      BP:           135/98 mmHg Patient Gender: F                             HR:           90 bpm. Exam Location:  Inpatient Procedure: Transesophageal Echo, 3D Echo, Color Doppler and Cardiac Doppler Indications:     Stroke i163.9  History:         Patient has prior history of Echocardiogram examinations, most                  recent 04/23/2020. Risk Factors:Hypertension and Diabetes.  Sonographer:     Irving Burton Senior RDCS Referring Phys:  [id number] Kathlynn Grate [place] Diagnosing Phys: Laurance Flatten MD PROCEDURE: After discussion of the risks and  benefits of a TEE, an informed consent was obtained from the patient. The transesophogeal probe was passed without difficulty through the esophogus of the patient. Local oropharyngeal anesthetic was provided with Cetacaine. Sedation performed by different physician. The patient was monitored while under deep sedation. Anesthestetic sedation was provided intravenously by Anesthesiology:  of Propofol. The patient developed no complications during the procedure. IMPRESSIONS  1. Left ventricular ejection fraction, by estimation, is 20 to 25%. The left ventricle has severely decreased function. The left ventricle demonstrates global hypokinesis.  2. Right ventricular systolic function is moderately reduced. The right ventricular size is normal.  3. Left atrial size was moderately dilated. No left atrial/left atrial appendage thrombus was detected. The LAA emptying velocity was 30 cm/s.  4. Right atrial size was mildly dilated.  5. The mitral valve is degenerative. Mild to moderate mitral valve regurgitation.  6. The aortic valve is tricuspid. There is mild calcification of the aortic valve. There is mild thickening of the aortic valve. Aortic valve regurgitation is trivial.  7. 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  8. A small PFO was detected by color flow Doppler with predominant L-->R shunting, however, there is flow reversal (R-->L) with valsalva.  9. Agitated saline contrast bubble study was positive with shunting observed within 3-6 cardiac cycles with valsalva suggestive of interatrial shunt. FINDINGS  Left Ventricle: Left ventricular ejection fraction, by estimation, is 20 to 25%. The left ventricle has severely decreased function. The left ventricle demonstrates global hypokinesis. The left ventricular internal cavity size was normal in size. Right Ventricle: The right ventricular size is normal. No increase in right ventricular wall  thickness. Right ventricular systolic function is moderately reduced. Left Atrium: Left  atrial size was moderately dilated. No left atrial/left atrial appendage thrombus was detected. The LAA emptying velocity was 30 cm/s. Right Atrium: Right atrial size was mildly dilated. Pericardium: There is no evidence of pericardial effusion. Mitral Valve: The mitral valve is degenerative in appearance. There is mild thickening of the mitral valve leaflet(s). There is mild calcification of the mitral valve leaflet(s). Mild to moderate mitral valve regurgitation. Tricuspid Valve: The tricuspid valve is normal in structure. Tricuspid valve regurgitation is mild. Aortic Valve: The aortic valve is tricuspid. There is mild calcification of the aortic valve. There is mild thickening of the aortic valve. Aortic valve regurgitation is trivial. 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 Pulmonic Valve: The pulmonic valve was normal in structure. Pulmonic valve regurgitation is trivial. Aorta: The aortic root and ascending aorta are structurally normal, with no evidence of dilitation. IAS/Shunts: Evidence of atrial level shunting detected by color flow Doppler. Agitated saline contrast was given intravenously to evaluate for intracardiac shunting. Agitated saline contrast bubble study was positive with shunting observed within 3-6 cardiac cycles suggestive of interatrial shunt. A small patent foramen ovale is detected. Laurance Flatten MD Electronically signed by Laurance Flatten MD Signature Date/Time: 04/24/2020/9:38:38 AM    Final    DG HIP UNILAT WITH PELVIS 2-3 VIEWS RIGHT  Result Date: 05/15/2020 CLINICAL DATA:  Fall in bathroom with right hip pain EXAM: DG HIP (WITH OR WITHOUT PELVIS) 2-3V RIGHT COMPARISON:  None. FINDINGS: No pelvic fracture or diastasis. No right hip fracture or dislocation. No focal osseous lesions. No significant right hip arthropathy.  Mild degenerative changes in the visualized lower lumbar spine. No radiopaque foreign bodies. IMPRESSION: No fracture or malalignment in the right hip. Electronically Signed   By: Delbert Phenix M.D.   On: 05/15/2020 14:18   VAS [place] LOWER EXTREMITY VENOUS (DVT)  Result Date: 04/23/2020  Lower Venous DVT Study Indications: SOB.  Risk Factors: None identified. Anticoagulation: Lovenox. Limitations: Patient movement. Comparison Study: No previous exams Performing Technologist: Clint Guy RVT  Examination Guidelines: A complete evaluation includes B-mode imaging, spectral Doppler, color Doppler, and power Doppler as needed of all accessible portions of each vessel. Bilateral testing is considered an integral part of a complete examination. Limited examinations for reoccurring indications may be performed as noted. The reflux portion of the exam is performed with the patient in reverse Trendelenburg.  +---------+---------------+---------+-----------+----------+--------------+  RIGHT     Compressibility Phasicity Spontaneity Properties Thrombus Aging  +---------+---------------+---------+-----------+----------+--------------+  CFV       Full            Yes       Yes                                    +---------+---------------+---------+-----------+----------+--------------+  SFJ       Full                                                             +---------+---------------+---------+-----------+----------+--------------+  FV Prox   Full                                                             +---------+---------------+---------+-----------+----------+--------------+  FV Mid    Full                                                             +---------+---------------+---------+-----------+----------+--------------+  FV Distal Full                                                             +---------+---------------+---------+-----------+----------+--------------+  PFV       Full                                                              +---------+---------------+---------+-----------+----------+--------------+  POP       Full            Yes       Yes                                    +---------+---------------+---------+-----------+----------+--------------+  PTV       Full                                                             +---------+---------------+---------+-----------+----------+--------------+  PERO      Full                                                             +---------+---------------+---------+-----------+----------+--------------+   +---------+---------------+---------+-----------+----------+--------------+  LEFT      Compressibility Phasicity Spontaneity Properties Thrombus Aging  +---------+---------------+---------+-----------+----------+--------------+  CFV       Full            Yes       Yes                                    +---------+---------------+---------+-----------+----------+--------------+  SFJ       Full                                                             +---------+---------------+---------+-----------+----------+--------------+  FV Prox   Full                                                             +---------+---------------+---------+-----------+----------+--------------+  FV Mid    Full                                                             +---------+---------------+---------+-----------+----------+--------------+  FV Distal Full                                                             +---------+---------------+---------+-----------+----------+--------------+  PFV       Full                                                             +---------+---------------+---------+-----------+----------+--------------+  POP       Full            Yes       Yes                                    +---------+---------------+---------+-----------+----------+--------------+  PTV       Full                                                              +---------+---------------+---------+-----------+----------+--------------+  PERO      Full                                                             +---------+---------------+---------+-----------+----------+--------------+  Summary: BILATERAL: - No evidence of deep vein thrombosis seen in the lower extremities, bilaterally. -No evidence of popliteal cyst, bilaterally.   *See table(s) above for measurements and observations. Electronically signed by Waverly Ferrari MD on 04/23/2020 at 11:27:04 AM.    Final    US Abdomen Limited RUQ (LIVER/GB)  Result Date: 04/24/2020 CLINICAL DATA:  Elevated liver enzymes EXAM: ULTRASOUND ABDOMEN LIMITED RIGHT UPPER QUADRANT COMPARISON:  None. FINDINGS: Gallbladder: Surgically absent. Common bile duct: Diameter: 5 mm. No intrahepatic or extrahepatic biliary duct dilatation. Liver: No focal lesion identified. Within normal limits in parenchymal echogenicity. Portal vein is patent on color Doppler imaging with normal direction of blood flow towards the liver. Other: None. IMPRESSION: Gallbladder absent.  Study otherwise unremarkable. Electronically Signed   By: Bretta Bang III M.D.   On: 04/24/2020 15:05    Labs:  Basic Metabolic Panel: Recent Labs  Lab 05/13/20 0458 05/14/20 0501  NA 129* 130*  K 3.9 4.0  CL 92* 92*  CO2 23 22  GLUCOSE 146* 145*  BUN 12 14  CREATININE 0.67 0.80  CALCIUM 8.6* 9.1  CBC: Recent Labs  Lab 05/14/20 0501  WBC 11.7*  HGB 9.8*  HCT 31.7*  MCV 108.2*  PLT 532*    CBG: Recent Labs  Lab 05/15/20 0600 05/15/20 1025 05/15/20 1240 05/15/20 1637 05/15/20 2126  GLUCAP 128* 185* 154* 148* 146*   Family history.  Mother with muscular dystrophy Father with diabetes Brother with diabetes.  Denies any colon cancer esophageal cancer rectal cancer  Brief HPI:   Madison Hurst is a right-handed 60 y.o. female with history of paroxysmal atrial tachycardia followed by Dr.Kardie Tobb, CAD,  hypertension, diabetes mellitus, chronic anemia hyperlipidemia rheumatoid arthritis maintained on chronic prednisone anxiety and depression.  Patient lives with spouse 1 level home 2 steps to entry.  Patient reports ambulating independently but with poor balance.  Presented 04/22/2020 with progressive left side weakness x1 week resulting in 3 falls.  Admission chemistries glucose 173 AST 65 ALT 62 alkaline phosphatase, alcohol negative, hemoglobin A1c 6.1.  Cranial CT scan unremarkable for acute intracranial process.  Patient did not receive TPA.  MRI showed acute infarction affecting the right mid posterior caudate and right superior thalamus.  Single separate punctate acute infarct in the cortex of the medial right parieto-occipital junction.  Few small punctate acute infarcts in the right occipital cortex.  No evidence of hemorrhage or mass-effect.  CT angiogram of head and neck mild stenosis of the left PCA P3 segment.  No emergent large vessel occlusion.  MRI cervical spine showed apparent moderate to severe cervical spinal stenosis at C4-5 and C5-6 and moderate at C3-4.  Multilevel neuroforaminal narrowing greatest of C3-4 and on the right C4-5.  Echocardiogram with ejection fraction of 20 to 25% with left ventricle global hypokinesis.  TEE showed mild to moderate MR.  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  No L LAA thrombus.  Lower extremity Dopplers negative.  Maintained on aspirin as well as Eliquis, LFTs continued to elevate monitored.  Acute hepatitis panel negative.  Her Crestor was discontinued due to elevated LFTs.  Ultrasound of abdomen showed no focal lesion identified.  Gallbladder was absent.  Study otherwise unremarkable.  Autoimmune markers pending.  There was some question of possible need for liver biopsy however felt to be somewhat risky while maintained on Eliquis gastroenterology service Dr. Marina Goodell consulted advised continue to monitor LFTs and follow-up outpatient.  Due to  patient's progressive left-sided weakness she was admitted for a comprehensive rehab program.   Hospital Course: Madison Hurst was admitted to rehab 04/26/2020 for inpatient therapies to consist of PT, ST and OT at least three hours five days a week. Past admission physiatrist, therapy team and rehab RN have worked together to provide customized collaborative inpatient rehab.  Pertaining to patient's right MCA/PCA, right PLIC and CR scattered small punctate infarcts remained stable.  Follow-up cranial CT scan 05/07/2020 due to question of altered mental status showed no acute abnormality.  Currently maintained on aspirin.  She had been on Eliquis discontinued 05/04/2020 due to GI bleed/bloody stools and i and neurology service made aware.  Patient no further bleeding episodes.  Venous Doppler studies negative.  Pain managed use of hydrocodone as needed as well as Voltaren gel.  Mood stabilization with Cymbalta as well as Wellbutrin ongoing Ativan as needed with emotional support provided monitoring of mental status.  History of PAF-CAD-PFO she is followed by Dr.Kardie Tobb and will need follow-up outpatient.  There was some speculation if her ejection fraction improved no atrial fibrillation in 3 months  her Eliquis to be discontinued which had already taken place due to GI bleed and considering plan for possible Plavix which could be addressed as outpatient she would currently remain on low-dose aspirin.  LFTs continue to improve follow-up outpatient gastroenterology service Dr. Marina Goodell.  Crestor been discontinued due to elevated LFTs.  She did have a history of rheumatoid arthritis low-dose prednisone as advised she could continue weekly orencia as prior to admission.  Blood sugars overall controlled hemoglobin A1c of 6.1 she remained on Glucophage.  Synthroid ongoing for hypothyroidism.  Blood pressure controlled on low-dose Toprol.  Patient did have 2 reported falls that were assisted by nursing staff  without injury x-rays of right hip and right knee showed no fracture or malalignment.  All issues in regards to patient's fall as well as risk factors and safety were discussed with family and again stressed the need to maintain close safety precautions while at home to guard against fall.   Blood pressures were monitored on TID basis and soft and monitored  Diabetes has been monitored with ac/hs CBG checks and SSI was use prn for tighter BS control.    Rehab course: During patient's stay in rehab weekly team conferences were held to monitor patient's progress, set goals and discuss barriers to discharge. At admission, patient required moderate assist 10 feet rolling walker moderate assist stand pivot transfers moderate assist upper body dressing max is lower body dressing  Physical exam.  Blood pressure 116/85 pulse 90 temperature 97 respirations 18 oxygen saturation 99% room air Constitutional.  No acute distress HEENT Head.  Normocephalic and atraumatic Eyes.  Pupils round and reactive to light no discharge.nystagmus Neck.  Supple nontender no JVD without thyromegaly Cardiac regular rate rhythm without extra sounds or murmur heard Abdomen.  Soft nontender positive bowel sounds without rebound Respiratory effort normal no respiratory distress without wheeze Musculoskeletal.  Normal range of motion tenderness in hands and feet Skin.  Erythema in digits Neurologic alert makes eye contact with examiner provides name and age follows simple commands Motor.  Right upper extremity 3 -/5 proximal distal Left upper extremity 2+/5 proximal distal Right lower extremity 2+/5 proximal distal Left lower extremity 2/5 proximal distal  He/She  has had improvement in activity tolerance, balance, postural control as well as ability to compensate for deficits. He/She has had improvement in functional use RUE/LUE  and RLE/LLE as well as improvement in awareness.  Patient comes to edge of bed with  supervision head of bed elevated.  Able to brush hair seated unsupported close supervision.  Transferring to lower hemiheight wheelchair via stand pivot with minimal assist.  She was able to propel wheelchair bilateral lower extremities and support from bilateral upper extremities intermittently for steering with contact-guard assist.  Sit to stand and short distance ambulation rolling walker to bed and wheelchair contact-guard occasional cues for hand placement.  She completed oral care washing face and standing at sink contact-guard set up.  She was able to gather clothing wheelchair level minimal assist.  She transferred back to bed for dressing task.  Able to doff clothing using dressing stick with minimal assist.  She donned underwear with contact-guard assist.  Ongoing family teaching stressed the need for assistance for her safety.  Speech therapy facilitated sessions with overall minimal assist verbal cues for use of strategies to recall details of family education training.  Patient more accurate with her daily recall at time of discharge.  Full family teaching completed plan discharge to home  Disposition: Discharge to home    Diet: Carb modified  Special Instructions: No driving smoking or alcohol  Patient follow-up gastroenterology service Dr. Marina Goodell in regards to monitoring elevated LFTs  Follow-up cardiology services Dr.Kardie Tobb and evaluation of plan for possible addition of Plavix if no further bleeding  Medications at discharge 1.  Tylenol as needed 2.  Aspirin 81 mg p.o. daily 3.  Wellbutrin 150 mg p.o. daily 4.  Voltaren gel 2 g 4 times daily 5.  Cymbalta 30 mg p.o. daily 6.  Folic acid 2 mg p.o. daily 7.  Synthroid 75 mcg p.o. daily 8.  Melatonin 5 mg p.o. nightly 9.  Glucophage 500 mg p.o. twice daily 10.  Multivitamin daily 11.  Protonix 40 mg p.o. daily 12.  Prednisone 5 mg p.o. daily 13.  Toprol 12.5 mg daily   Discharge Instructions    Ambulatory  referral to Neurology   Complete by: As directed    An appointment is requested in approximately 4 weeks right MCA infarction   Ambulatory referral to Physical Medicine Rehab   Complete by: As directed    Moderate complexity follow-up 1 to 2 weeks right MCA infarction       Follow-up Information    Kirsteins, Victorino Sparrow, MD Follow up.   Specialty: Physical Medicine and Rehabilitation Why: Office to call for appointment Contact information: [address] [place] [address] [phone]        Thomasene Ripple, DO Follow up.   Specialty: Cardiology Why: Call for appointment Contact information: [address] [place] [address] [phone]        Hilarie Fredrickson, MD Follow up.   Specialty: Gastroenterology Why: Call for appointment Contact information: [address] [place] [address] [phone]               Signed: Mcarthur Rossetti Detroit Frieden 05/16/2020, 5:11 AM

## 2020-05-14 NOTE — Progress Notes (Signed)
[facility] PHYSICAL MEDICINE & REHABILITATION PROGRESS NOTE   Subjective/Complaints:  No issues overnight , up in motorized chair with PT, pt with chronic joint deformities and UE contractures that limit walker and manual WC use, power chair best option for this pt   ROS: Patient denies CP, SOB, N/V/D Objective:   No results found. Recent Labs    05/14/20 0501  WBC 11.7*  HGB 9.8*  HCT 31.7*  PLT 532*   Recent Labs    05/13/20 0458 05/14/20 0501  NA 129* 130*  K 3.9 4.0  CL 92* 92*  CO2 23 22  GLUCOSE 146* 145*  BUN 12 14  CREATININE 0.67 0.80  CALCIUM 8.6* 9.1    Intake/Output Summary (Last 24 hours) at 05/14/2020 0821 Last data filed at 05/14/2020 0718 Gross per 24 hour  Intake 840 ml  Output --  Net 840 ml        Physical Exam: Vital Signs Blood pressure (!) 86/59, pulse 74, temperature 98.5 F (36.9 C), resp. rate 18, height 5' (1.524 m), weight 56.6 kg, SpO2 90 %.   General: No acute distress Mood and affect are appropriate Heart: Regular rate and rhythm no rubs murmurs or extra sounds Lungs: Clear to auscultation, breathing unlabored, no rales or wheezes Abdomen: Positive bowel sounds, soft nontender to palpation, nondistended Extremities: No clubbing, cyanosis, or edema Skin: No evidence of breakdown, no evidence of rash  Musc: No edema in extremities.  Left foot, ankle sl tender Neuro: Alert, oriented to person, place, date/month. Follows commands. Reasonable insight Motor: 4+/5 RUE and RLE and 4/5 LUE and LLE  Assessment/Plan: 1. Functional deficits which require 3+ hours per day of interdisciplinary therapy in a comprehensive inpatient rehab setting.  Physiatrist is providing close team supervision and 24 hour management of active medical problems listed below.  Physiatrist and rehab team continue to assess barriers to discharge/monitor patient progress toward functional and medical goals  Care Tool:  Bathing    Body parts bathed by  patient: Right arm,Left arm,Chest,Abdomen,Front perineal area,Right upper leg,Left upper leg,Face,Right lower leg,Left lower leg   Body parts bathed by helper: Buttocks     Bathing assist Assist Level: Minimal Assistance - Patient > 75%     Upper Body Dressing/Undressing Upper body dressing   What is the patient wearing?: Pull over shirt    Upper body assist Assist Level: Minimal Assistance - Patient > 75%    Lower Body Dressing/Undressing Lower body dressing      What is the patient wearing?: Underwear/pull up,Pants     Lower body assist Assist for lower body dressing: Moderate Assistance - Patient 50 - 74%     Toileting Toileting    Toileting assist Assist for toileting: Maximal Assistance - Patient 25 - 49%     Transfers Chair/bed transfer  Transfers assist     Chair/bed transfer assist level: Moderate Assistance - Patient 50 - 74% Chair/bed transfer assistive device: Geologist, engineering   Ambulation assist      Assist level: Moderate Assistance - Patient 50 - 74% Assistive device: Walker-rolling Max distance: 12'   Walk 10 feet activity   Assist     Assist level: Minimal Assistance - Patient > 75% Assistive device: Walker-rolling   Walk 50 feet activity   Assist Walk 50 feet with 2 turns activity did not occur: Safety/medical concerns         Walk 150 feet activity   Assist Walk 150 feet activity did not occur:  N/A (pt does not ambulate this distance at basline)         Walk 10 feet on uneven surface  activity   Assist Walk 10 feet on uneven surfaces activity did not occur: Safety/medical concerns         Wheelchair     Assist Will patient use wheelchair at discharge?: Yes Type of Wheelchair: Manual    Wheelchair assist level: Contact Guard/Touching assist Max wheelchair distance: 200    Wheelchair 50 feet with 2 turns activity    Assist        Assist Level: Contact Guard/Touching assist    Wheelchair 150 feet activity     Assist      Assist Level: Contact Guard/Touching assist   Blood pressure (!) 86/59, pulse 74, temperature 98.5 F (36.9 C), resp. rate 18, height 5' (1.524 m), weight 56.6 kg, SpO2 90 %.   Medical Problem List and Plan: 1.  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  Antithrombotics: -DVT/anticoagulation: Eliquis 5 mg twice daily, d/ced due to GI bleed and increasing hematoma, Neuro aware.    Lower extremity Dopplers negative             -antiplatelet therapy: Aspirin 81 mg daily 3. Pain Management: Continue Hydrocodone as needed             2/7: does have some pain in rash in lower extremities. She has been using hydrocodone twice per day- says she uses it once per day at home. Says she uses it for diffuse arthitic pains. She has not tried voltaren gel but is willing to.   2/8: diclofenac gel helped, continue  2/9: has not required Norco in 2 days! Continue diclofenac gel   2/13: mentation appears much improved. Suspect that norco was the main culprit. Pt says she feels better also. Continue to observe 4. Mood: Cymbalta 60 mg daily, Wellbutrin 150 mg daily, Ativan 0.5 mg every 8 hours as needed- d/ced d/t AMS             -antipsychotic agents: N/A 5. Neuropsych: This patient is capable of making decisions on her own behalf. 6. Skin/Wound Care: Routine skin checks 7. Fluids/Electrolytes/Nutrition: Routine in and outs.   8.  History of PAF/CAD/PFO.  30-day cardiac event monitor per cardiology services Dr.Kardie Tobb.  Continue Eliquis for now.  If EF improves and no atrial fibrillation in 3 months plan to stop Eliquis and restart Plavix.             Monitor with increased activity 9.  Transaminitis.  Follow-up gastroenterology service Dr. Marina Goodell.               LFTs elevated on 1/31, AST and ALT trending downward on 2/7, repeat CMP ordered for  2/14 show further improvement in AST and ALT  10.  Hyperlipidemia.  Crestor on hold due to elevated LFTs. 11.  Rheumatoid arthritis: Low-dose prednisone as well as weekly Orencia (was on Ansid at home flurbiprofen)            Some increased joint pain but no increased swelling noted today  12.  Diabetes mellitus.  Hemoglobin A1c 6.1.  SSI.            CBG (last 3)  Recent Labs    05/13/20 1700 05/13/20 2109 05/14/20 0556  GLUCAP 195* 172* 135*     2/15- controlled  13.  Hypothyroidism: Synthroid 14.  Essential hypertension.. Blood pressure had remained a bit soft Toprol-XL resumed at 12.5 mg daily 04/26/2020   Vitals:   05/13/20 1924 05/14/20 0350  BP: 102/67 (!) 86/59  Pulse: 78 74  Resp: 17 18  Temp: 98.1 F (36.7 C) 98.5 F (36.9 C)  SpO2:  90%   Soft this am , no signs of prerenal azotemia on labs today , hgb is stable , check orthostatic vitals  15.  Constipation: multiple stools post sorbitol 1/29 16. Sleep disturbance: melatonin 3mg  started HS  Improving 17.  GI bleed with drop in Hgb, suspect lower GI bleed given bright red blood  Hb 10.1 on 2/5, cont to monitor 18. Tachycardia:improved  continue Toprol.  19. Confusion:   Resolved      20.  Hypo Na+ no obvious meds, fluid intake is not excessive , no IVF  , repeat is stable  LOS: 18 days A FACE TO FACE EVALUATION WAS PERFORMED  05/14/2020, 8:21 AM

## 2020-05-14 NOTE — Progress Notes (Signed)
Patient ID: Madison Hurst, female   DOB: 14-Jul-1960, 60 y.o.   MRN: [id number]   Patient referral faxed to [facility].  [place], [place] [phone]

## 2020-05-14 NOTE — Progress Notes (Signed)
Nutrition Follow-up  DOCUMENTATION CODES:   Not applicable  INTERVENTION:   - Please obtain updated weight, last available weight is from 04/26/20  - Ensure Max po BID, each supplement provides 150 kcal and 30 grams of protein  - Continue MVI with minerals daily  - Encourage PO intake   NUTRITION DIAGNOSIS:   Increased nutrient needs related to other (extensive therapies) as evidenced by estimated needs.  Ongoing  GOAL:   Patient will meet greater than or equal to 90% of their needs  Progressing  MONITOR:   PO intake,Supplement acceptance,Weight trends,Labs,I & O's  REASON FOR ASSESSMENT:   Malnutrition Screening Tool    ASSESSMENT:   Patient with PMH significant for substernal goiter, CAD, HTN, HLD, and DM. 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  Per LCSW note, d/c date remains 2/17.  Noted pt had an episode of large volume emesis on 2/13.  Attempted to speak with pt at bedside. Pt sleeping soundly but was restless/fidgeting while asleep and mouthing words. Discussed with RN who reports that this is normal behavior for pt when asleep.  Attempted to discuss appetite and PO intake with pt but unable to wake pt up enough for conversation. Will continue with current oral nutrition supplement regimen.  Meal Completion: 20-100% x last 8 documented meals  Medications reviewed and include: folic acid, melatonin, metformin, MVI with minerals daily, protonix, prednisone, Ensure Max BID  Labs reviewed: sodium 130, elevated LFTs, hemoglobin 9.8 CBG's: 135-195 x 24 hours  Diet Order:   Diet Order            Diet heart healthy/carb modified Room service appropriate? Yes; Fluid consistency: Thin  Diet effective now                 EDUCATION NEEDS:   Education needs have been addressed  Skin:  Skin Assessment: Reviewed RN Assessment  Last BM:  05/11/20  Height:   Ht Readings from Last 1 Encounters:  04/26/20 5' (1.524 m)     Weight:   Wt Readings from Last 1 Encounters:  04/26/20 56.6 kg    Ideal Body Weight:  47.7 kg  BMI:  Body mass index is 24.37 kg/m.  Estimated Nutritional Needs:   Kcal:  1700-1900 kcal  Protein:  85-100 grams  Fluid:  >/= 1.7 L/day    Mertie Clause, MS, RD, LDN Inpatient Clinical Dietitian Please see [facility] for contact information.

## 2020-05-14 NOTE — Plan of Care (Signed)
  Problem: Consults Goal: RH STROKE PATIENT EDUCATION Description: See Patient Education module for education specifics  Outcome: Progressing   Problem: RH BOWEL ELIMINATION Goal: RH STG MANAGE BOWEL WITH ASSISTANCE Description: STG Manage Bowel with supervision. Outcome: Progressing   Problem: RH BLADDER ELIMINATION Goal: RH STG MANAGE BLADDER WITH ASSISTANCE Description: STG Manage Bladder With min Assistance Outcome: Progressing   Problem: RH SKIN INTEGRITY Goal: RH STG SKIN FREE OF INFECTION/BREAKDOWN Description: Min assist Outcome: Progressing Goal: RH STG MAINTAIN SKIN INTEGRITY WITH ASSISTANCE Description: STG Maintain Skin Integrity With min Assistance. Outcome: Progressing   Problem: RH SAFETY Goal: RH STG ADHERE TO SAFETY PRECAUTIONS W/ASSISTANCE/DEVICE Description: STG Adhere to Safety Precautions With min Assistance/Device. Outcome: Progressing Goal: RH STG DECREASED RISK OF FALL WITH ASSISTANCE Description: STG Decreased Risk of Fall With min Assistance. Outcome: Progressing   Problem: RH COGNITION-NURSING Goal: RH STG ANTICIPATES NEEDS/CALLS FOR ASSIST W/ASSIST/CUES Description: STG Anticipates Needs/Calls for Assist With Assistance/Cues. Outcome: Progressing   Problem: RH PAIN MANAGEMENT Goal: RH STG PAIN MANAGED AT OR BELOW PT'S PAIN GOAL Description: Pain less than or equal to 2. Outcome: Progressing

## 2020-05-15 ENCOUNTER — Inpatient Hospital Stay (HOSPITAL_COMMUNITY): Payer: Medicare PPO

## 2020-05-15 ENCOUNTER — Other Ambulatory Visit: Payer: Self-pay | Admitting: Physician Assistant

## 2020-05-15 LAB — GLUCOSE, CAPILLARY
Glucose-Capillary: 128 mg/dL — ABNORMAL HIGH (ref 70–99)
Glucose-Capillary: 185 mg/dL — ABNORMAL HIGH (ref 70–99)
Glucose-Capillary: 154 mg/dL — ABNORMAL HIGH (ref 70–99)
Glucose-Capillary: 148 mg/dL — ABNORMAL HIGH (ref 70–99)
Glucose-Capillary: 146 mg/dL — ABNORMAL HIGH (ref 70–99)

## 2020-05-15 IMAGING — CR DG HIP (WITH OR WITHOUT PELVIS) 2-3V*R*
3 series · 3 of 3 positions shown · non-contrast
Comparison: None.

CLINICAL DATA: Fall in bathroom with right hip pain

EXAM:
DG HIP (WITH OR WITHOUT PELVIS) 2-3V RIGHT

[pelvis ap]
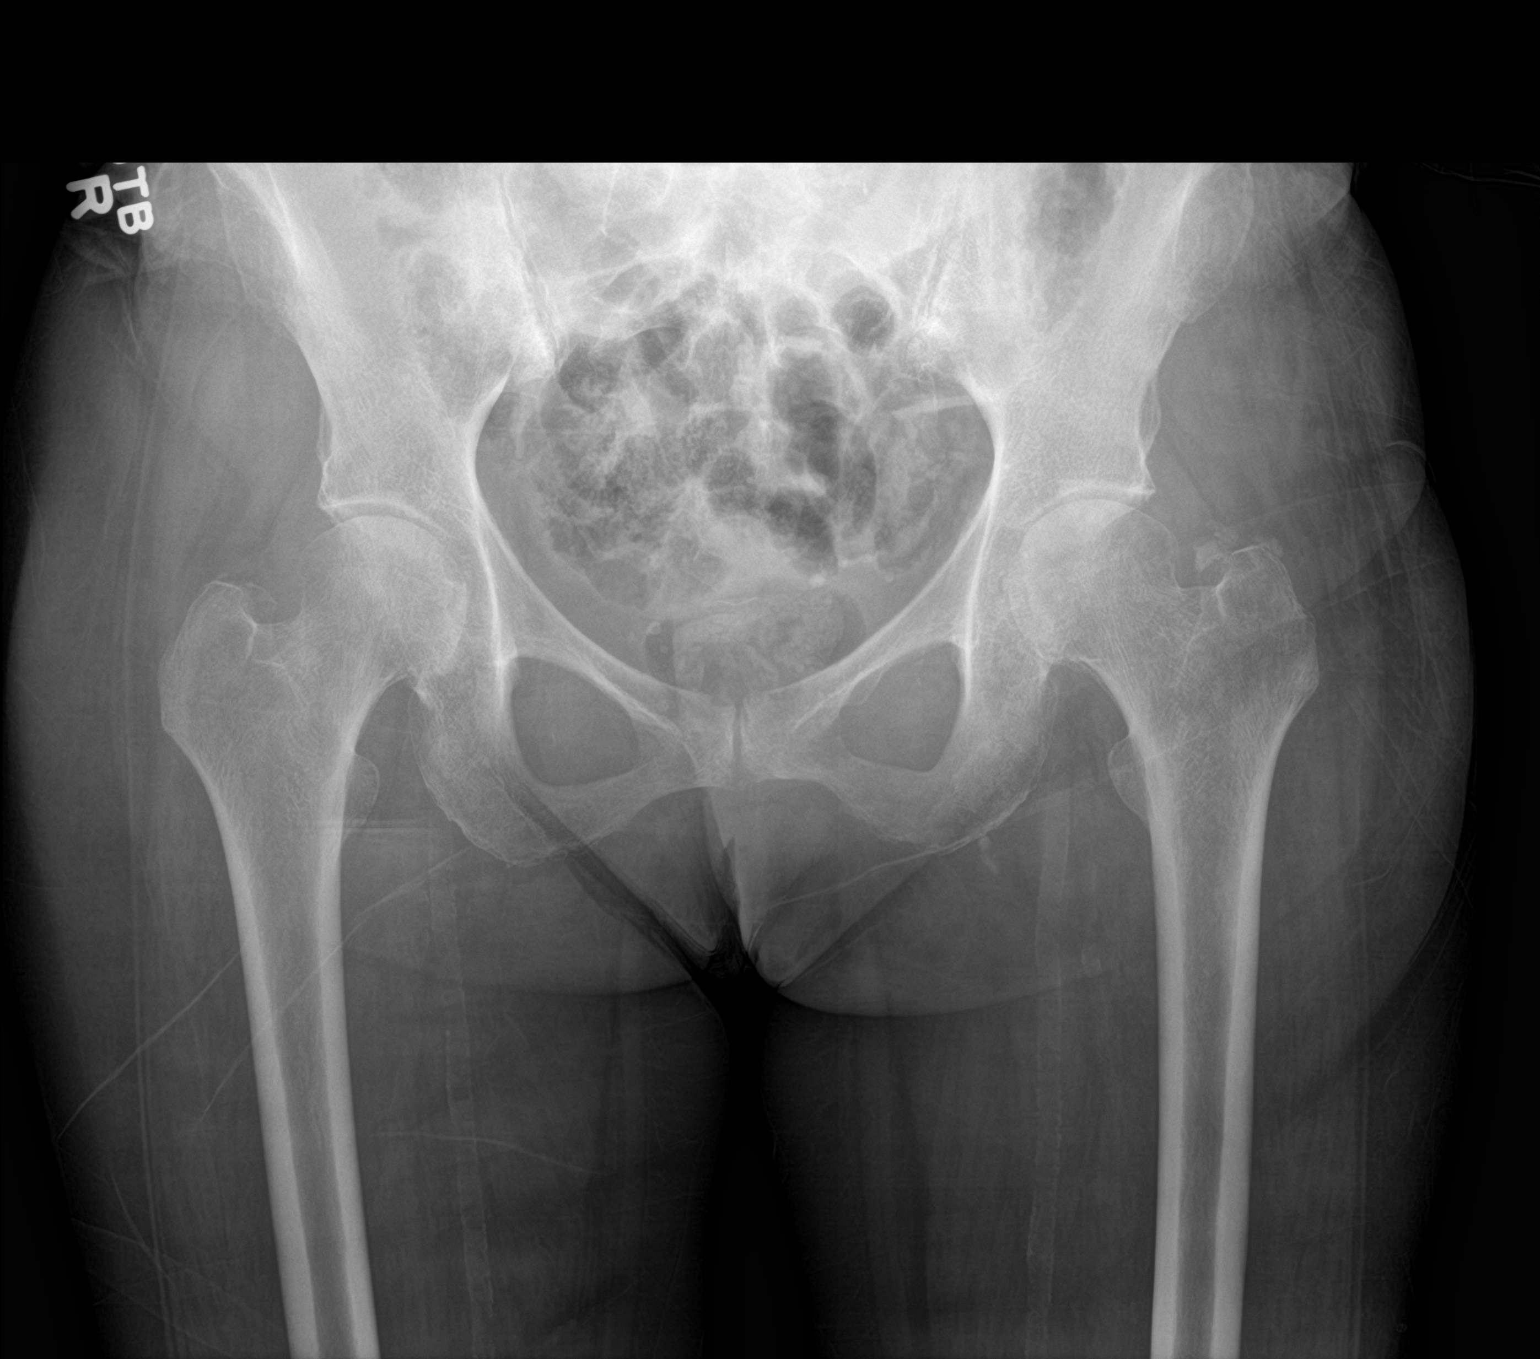

[hip ap]
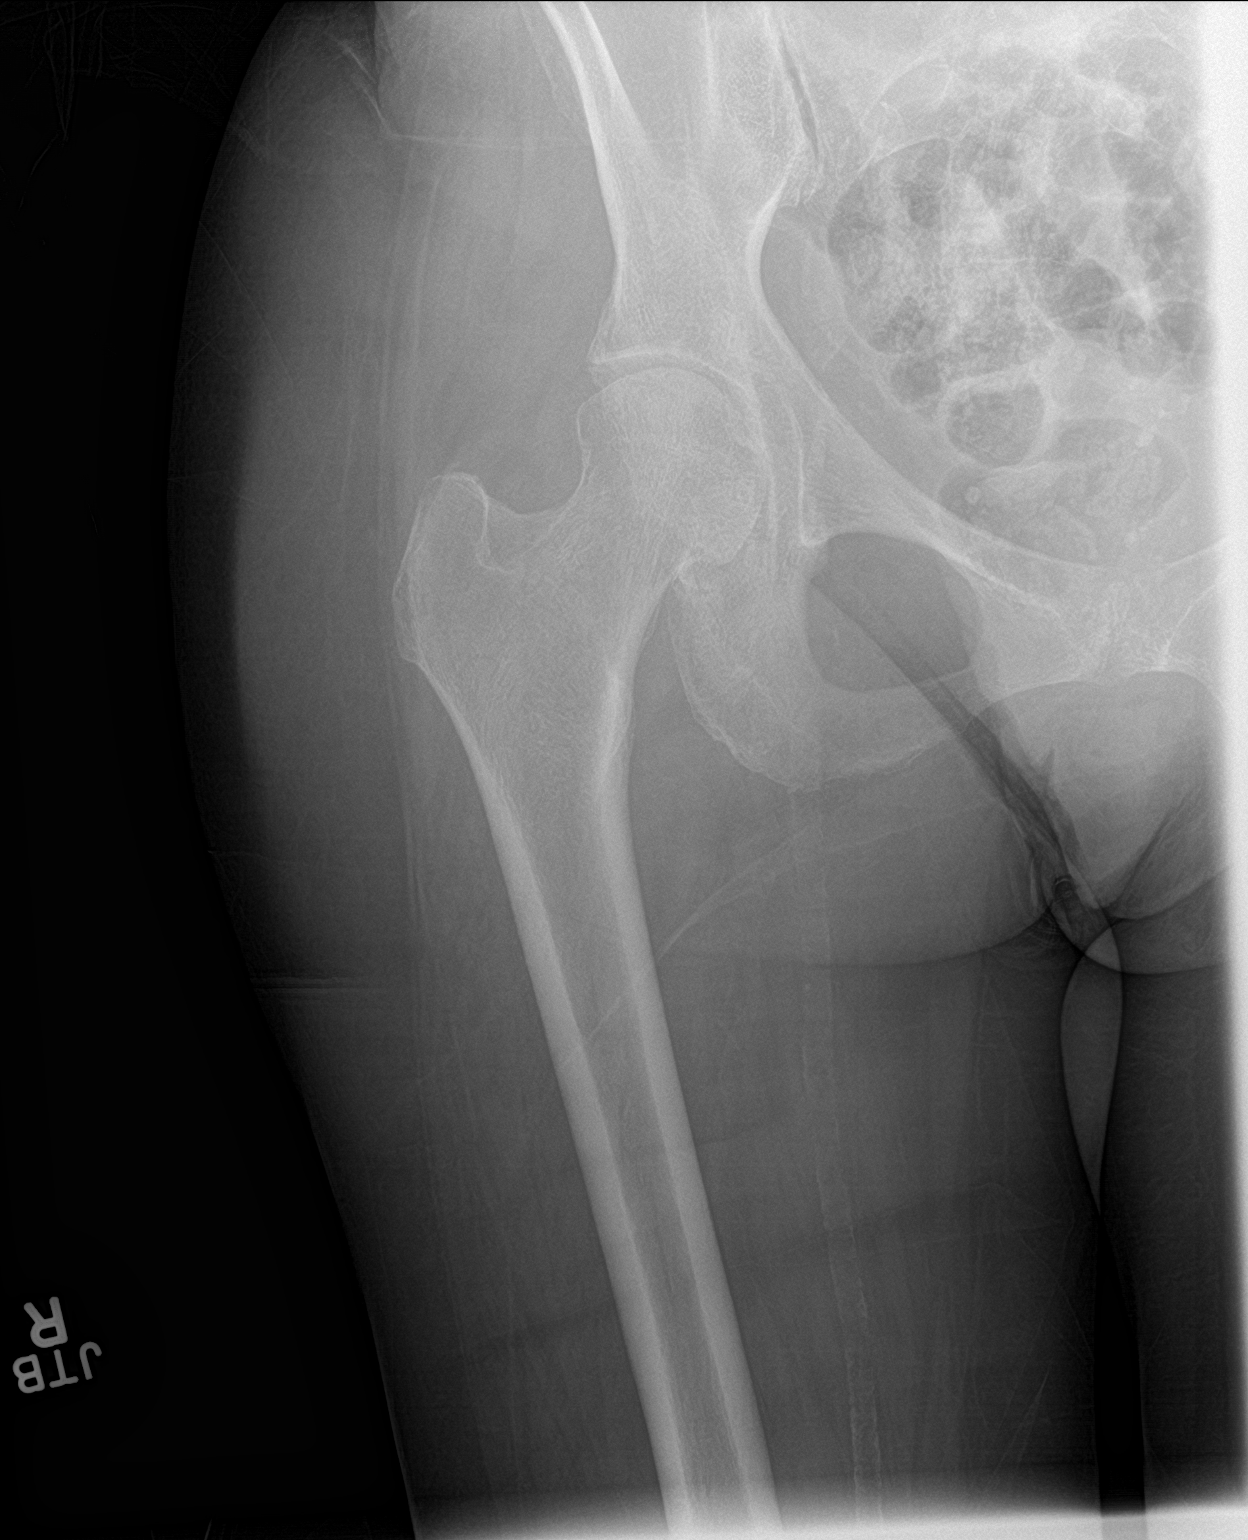

[hip lat]
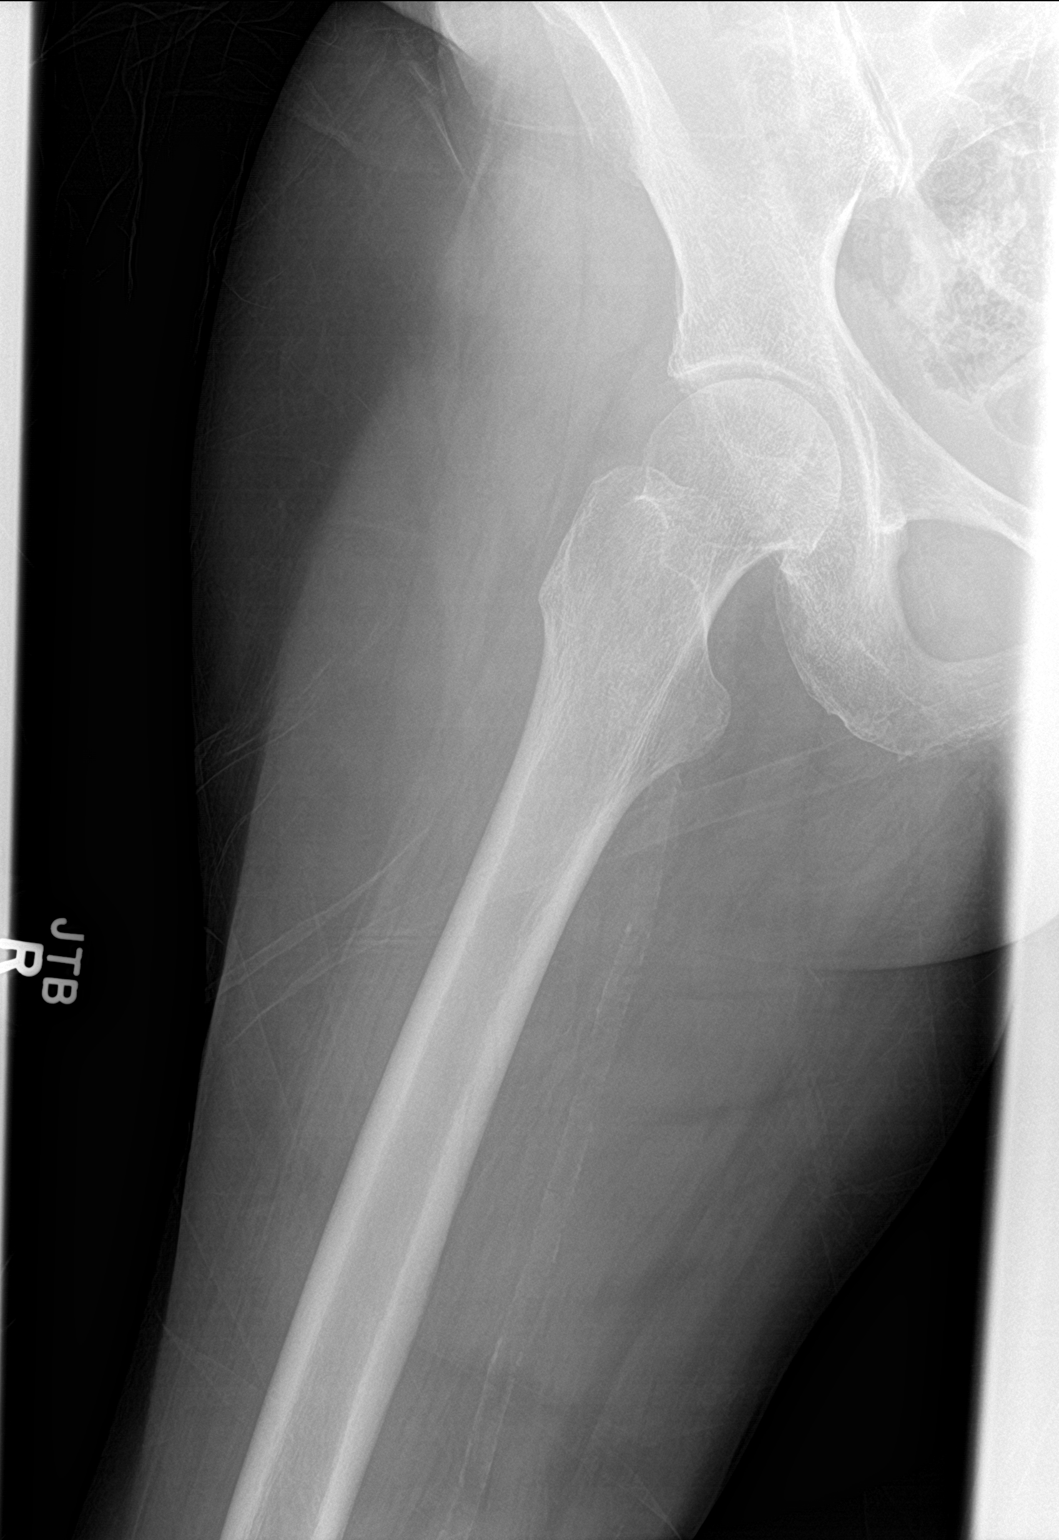

[3 of 3 positions shown; findings below may reference images not displayed]

FINDINGS: No pelvic fracture or diastasis. No right hip fracture or
dislocation. No focal osseous lesions. No significant right hip
arthropathy. Mild degenerative changes in the visualized lower
lumbar spine. No radiopaque foreign bodies.
IMPRESSION: No fracture or malalignment in the right hip.

## 2020-05-15 IMAGING — DX DG KNEE 1-2V PORT*R*
4 series · 4 of 4 positions shown · non-contrast
Comparison: None.

CLINICAL DATA: Fall with right knee.

EXAM:
PORTABLE RIGHT KNEE - 1-2 VIEW

[knee ap]
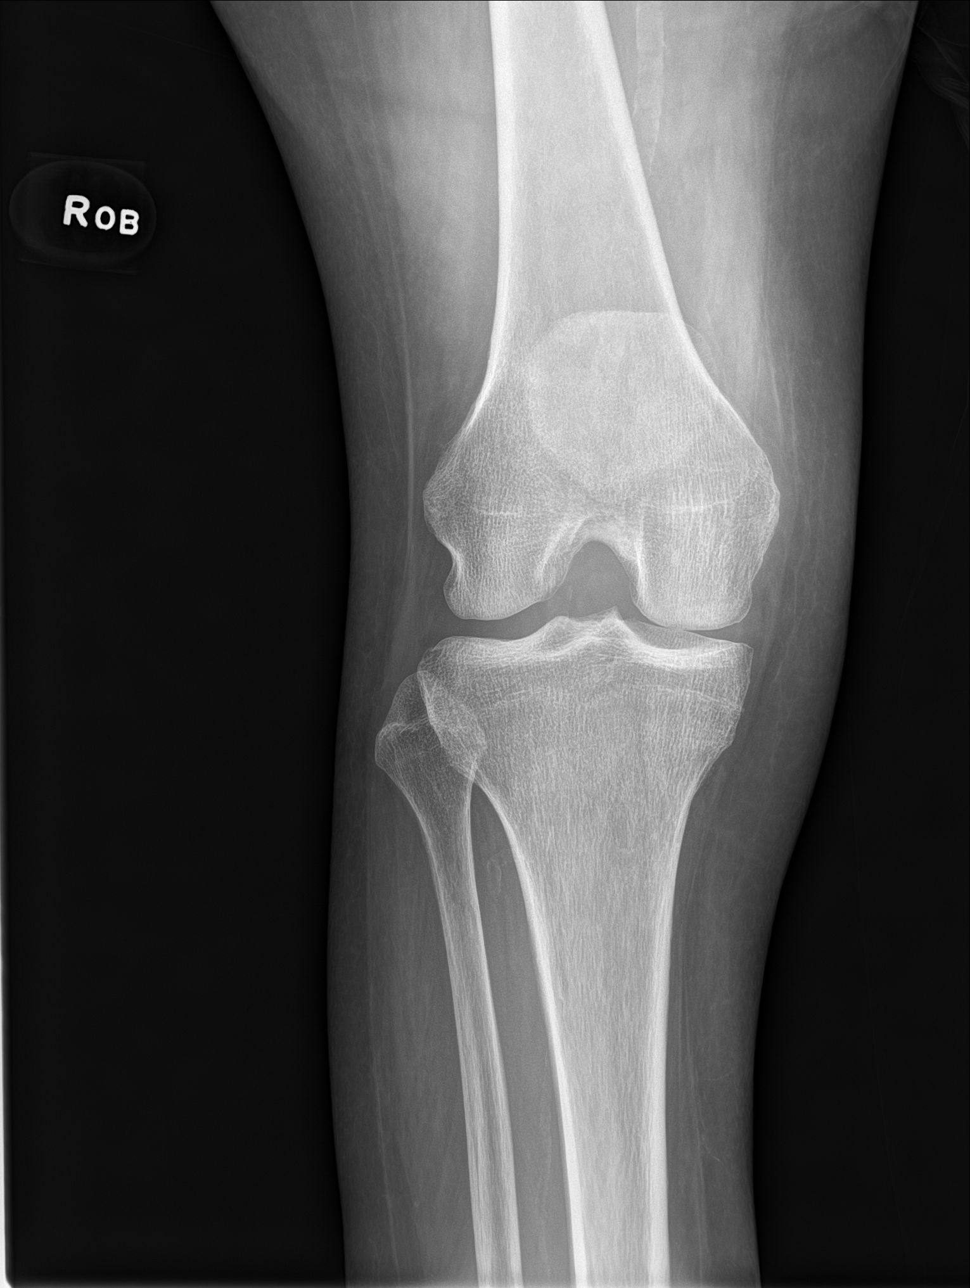

[knee lat]
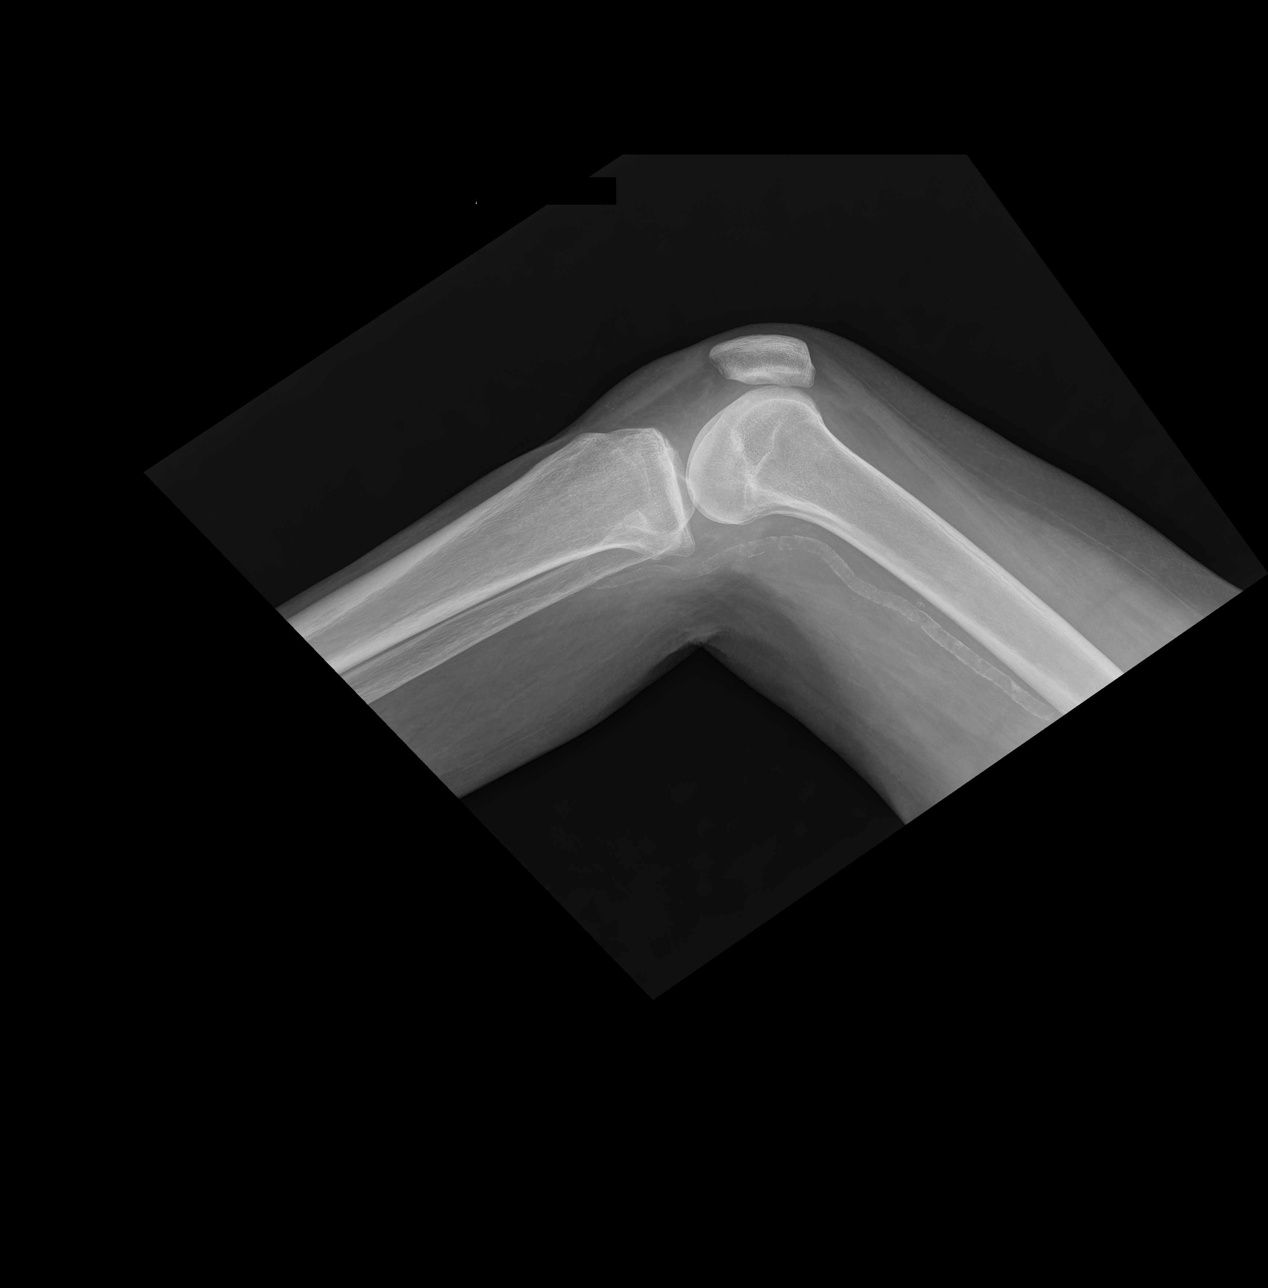

[knee obl (1 of 2)]
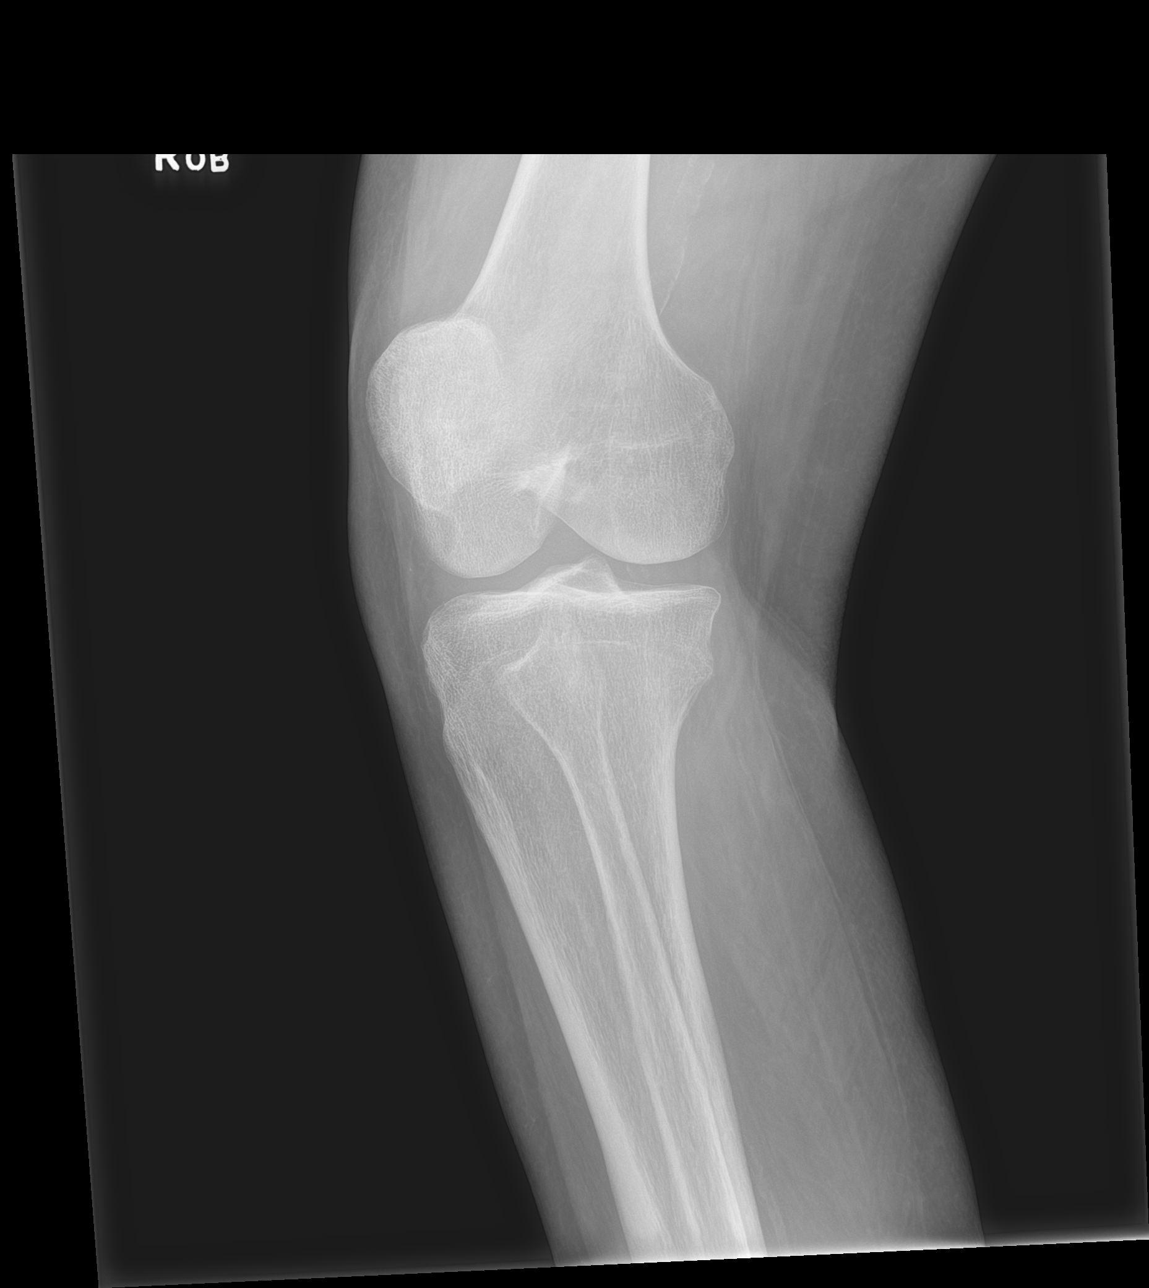

[knee obl (2 of 2)]
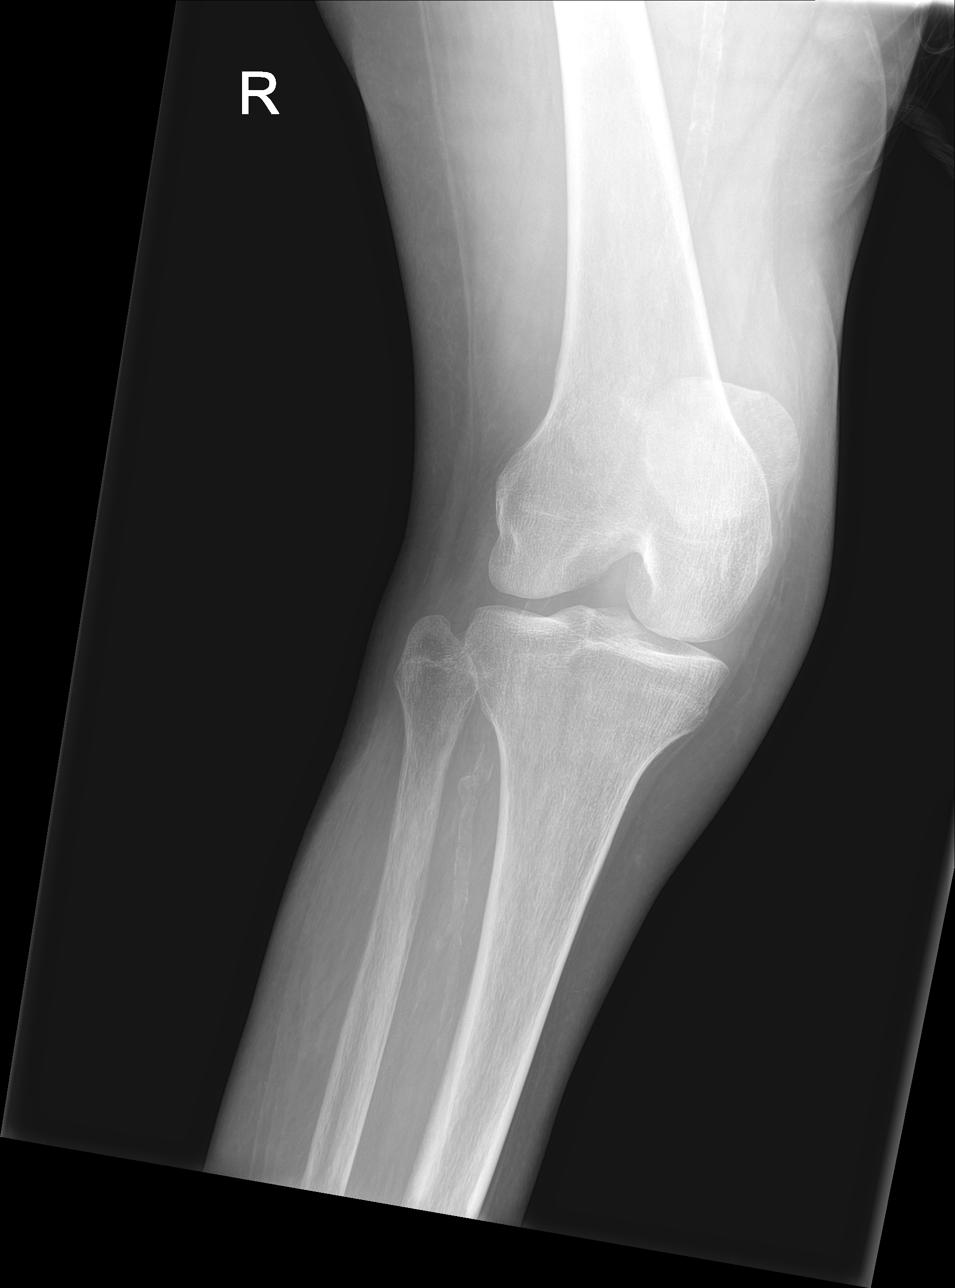

[4 of 4 positions shown; findings below may reference images not displayed]

FINDINGS: No fracture. No joint effusion. No dislocation. No suspicious focal
osseous lesions. No significant arthropathy. Vascular calcifications
in the posterior soft tissues. No radiopaque foreign bodies.
IMPRESSION: No right knee fracture, joint effusion or malalignment.

## 2020-05-15 MED ORDER — PREDNISONE 5 MG PO TABS: 5.0000 mg | ORAL_TABLET | Freq: Every day | ORAL | 0 refills | Status: AC

## 2020-05-15 MED ORDER — DICLOFENAC SODIUM 1 % EX GEL: 2.0000 g | Freq: Four times a day (QID) | CUTANEOUS | 0 refills | Status: AC

## 2020-05-15 MED ORDER — DULOXETINE HCL 30 MG PO CPEP
30.0000 mg | ORAL_CAPSULE | Freq: Every day | ORAL | Status: DC
  Administered 2020-05-16: 30 mg via ORAL
  Filled 2020-05-15: qty 1

## 2020-05-15 MED ORDER — PANTOPRAZOLE SODIUM 40 MG PO TBEC: 40.0000 mg | DELAYED_RELEASE_TABLET | Freq: Every day | ORAL | 0 refills | Status: AC

## 2020-05-15 MED ORDER — BUPROPION HCL ER (XL) 150 MG PO TB24: 150.0000 mg | ORAL_TABLET | Freq: Every day | ORAL | 0 refills | Status: AC

## 2020-05-15 MED ORDER — FOLIC ACID 1 MG PO TABS: 2.0000 mg | ORAL_TABLET | Freq: Every day | ORAL | 0 refills | Status: AC

## 2020-05-15 MED ORDER — ERGOCALCIFEROL 1.25 MG (50000 UT) PO CAPS: 50000.0000 [IU] | ORAL_CAPSULE | ORAL | 0 refills | Status: AC

## 2020-05-15 MED ORDER — LEVOTHYROXINE SODIUM 75 MCG PO TABS: 75.0000 ug | ORAL_TABLET | Freq: Every day | ORAL | 0 refills | Status: AC

## 2020-05-15 MED ORDER — METFORMIN HCL 500 MG PO TABS: 500.0000 mg | ORAL_TABLET | Freq: Two times a day (BID) | ORAL | 0 refills | Status: AC

## 2020-05-15 MED ORDER — DULOXETINE HCL 30 MG PO CPEP: 30.0000 mg | ORAL_CAPSULE | Freq: Every day | ORAL | 3 refills | Status: AC

## 2020-05-15 MED ORDER — DULOXETINE HCL 60 MG PO CPEP: 60.0000 mg | ORAL_CAPSULE | Freq: Every day | ORAL | 3 refills | Status: DC

## 2020-05-15 MED ORDER — ACETAMINOPHEN 325 MG PO TABS: 650.0000 mg | ORAL_TABLET | Freq: Four times a day (QID) | ORAL | Status: AC | PRN

## 2020-05-15 MED ORDER — MELATONIN 5 MG PO TABS: 5.0000 mg | ORAL_TABLET | Freq: Every day | ORAL | 0 refills | Status: AC

## 2020-05-15 MED FILL — DICLOFENAC SODIUM 1% GEL: 1 | 16 days supply | Qty: 200 | Fill #0

## 2020-05-15 MED FILL — PANTOPRAZOLE SOD DR 40 MG T: 40 | 30 days supply | Qty: 30 | Fill #0

## 2020-05-15 MED FILL — VIT D2 1.25 MG (50,000 UNIT: 1.25 MG | 28 days supply | Qty: 4 | Fill #0

## 2020-05-15 MED FILL — DULoxetine HCL 30 MG CPEP: 30 | 30 days supply | Qty: 30 | Fill #0

## 2020-05-15 MED FILL — LEVOTHYROXINE SODIUM 75 MCG: 75 | 30 days supply | Qty: 30 | Fill #0

## 2020-05-15 MED FILL — MELATONIN 5 MG TABS: 5 | 30 days supply | Qty: 30 | Fill #0

## 2020-05-15 MED FILL — metFORMIN HCL 500 MG TABS: 500 | 30 days supply | Qty: 60 | Fill #0

## 2020-05-15 NOTE — Progress Notes (Signed)
Occupational Therapy Session Note  Patient Details  Name: Madison Hurst MRN: [id number] Date of Birth: June 15, 1960  Today's Date: 05/15/2020 OT Individual Time: 1324-4010 OT Individual Time Calculation (min): 57 min    Short Term Goals: Week 3:  OT Short Term Goal 1 (Week 3): Continue working on established short term goals set a min assist overall.  Skilled Therapeutic Interventions/Progress Updates:    Pt completed supine to sit with min assist to start session.  She was not oriented to time stating that it was "Tuesday, no Thursday".  She was able to recall and tell therapist what had happened earlier in the day with regards to falling while attempting to ambulate to the bathroom with the NT.  She reported pain in her ankle and foot on the right side, but not related to fall, just from RA.  While sitting had her work on UB dressing with min instructional cueing for placing UEs in the shirt and then pulling it over head.  She needed min assist overall to donn the shirt with assist needed for lift it over her head only.  She then worked on donning pants with min assist to place over her feet and then mod to pull them up over her hips in standing.  Min assist was needed for sit to stand however during task.  She donned her slip on crocks at min assist level as well to pull up the strap in the back.  Next, she ambulated to the bathroom with use of the walker and mod assist.  Increased LOB posteriorly when stepping up the grade in the bathroom.  Mod assist was needed for clothing management with min assist for toilet hygiene sit to stand.  She then ambulated to the sink with min assist and stood to wash her hands.  Mod assist was needed to ambulate over to the EOB from standing at the sink secondary to fatigue with pt demonstrating multiple episodes of falling posteriorly and to the left side.  She was sat on the EOB and then therapist review handouts for splint wear and care as well as  for joint protection.  She completed session with transfer to supine with min assist.  Call button and phone in reach with safety bed alarm on middle setting.    Therapy Documentation Precautions:  Precautions Precautions: Fall Precaution Comments: EF 20-25%, L hemiparesis, RA (hands, shoulders, and ankles) Restrictions Weight Bearing Restrictions: No  Pain: Pain Assessment Pain Scale: Faces Pain Score: 4  Faces Pain Scale: Hurts a little bit Pain Type: Chronic pain Pain Location: Ankle Pain Orientation: Right Pain Descriptors / Indicators: Discomfort Pain Onset: On-going Pain Intervention(s): Repositioned;Emotional support ADL: See Care Tool Section for some details of mobility and selfcare  Therapy/Group: Individual Therapy  Savas Elvin OTR/L 05/15/2020, 4:41 PM

## 2020-05-15 NOTE — Progress Notes (Signed)
Patient ID: Madison Hurst, female   DOB: Sep 01, 1960, 60 y.o.   MRN: [id number]   SW called to follow up wit patient spouse. NO answer, left VM  Lavera Guise, [place] [phone]

## 2020-05-15 NOTE — Progress Notes (Signed)
   05/15/20 1030  What Happened  Was fall witnessed? Yes  Who witnessed fall? Williemae Area NT  Patients activity before fall ambulating-assisted  Point of contact buttocks  Was patient injured? Yes  Patient found on floor  Found by Staff-comment Williemae Area, Witnessed)  Stated prior activity other (comment) (ambulating with NT)  Follow Up  MD notified Jesusita Oka, [place]  Time MD notified [phone]  Family notified Yes - comment  Time family notified 1030  Additional tests Yes-comment (Xray)  Simple treatment Other (comment) (Pain meds)  Progress note created (see row info) Yes  Adult Fall Risk Assessment  Risk Factor Category (scoring not indicated) Fall has occurred during this admission (document High fall risk)  Age 60  Fall History: Fall within 6 months prior to admission 0  Elimination; Bowel and/or Urine Incontinence 0  Elimination; Bowel and/or Urine Urgency/Frequency 0  Medications: includes PCA/Opiates, Anti-convulsants, Anti-hypertensives, Diuretics, Hypnotics, Laxatives, Sedatives, and Psychotropics 5  Patient Care Equipment 0  Mobility-Assistance 2  Mobility-Gait 2  Mobility-Sensory Deficit 0  Altered awareness of immediate physical environment 0  Impulsiveness 0  Lack of understanding of one's physical/cognitive limitations 4  Total Score 13  Patient Fall Risk Level High fall risk  Adult Fall Risk Interventions  Required Bundle Interventions *See Row Information* High fall risk - low, moderate, and high requirements implemented  Additional Interventions Use of appropriate toileting equipment (bedpan, BSC, etc.)  Screening for Fall Injury Risk (To be completed on HIGH fall risk patients) - Assessing Need for Floor Mats  Risk For Fall Injury- Criteria for Floor Mats Previous fall this admission  Vitals  Temp 97.7 F (36.5 C)  Temp Source Oral  BP 116/84  BP Location Left Arm  BP Method Automatic  Patient Position (if appropriate) Sitting  Pulse Rate 100  Pulse Rate  Source Dinamap  Resp 16  Oxygen Therapy  SpO2 92 %  O2 Device Room Air  Pain Assessment  Pain Scale 0-10  Pain Score 7  Pain Type Acute pain  Pain Location Hip  Pain Orientation Right  Neurological  Neuro (WDL) X  Level of Consciousness Alert  Orientation Level Oriented X4  Cognition Appropriate at baseline;Follows commands  Speech Clear  Pupil Assessment  No  RUE Motor Response Purposeful movement  RUE Sensation Full sensation  RUE Motor Strength 4  LUE Motor Response Purposeful movement  LUE Sensation Full sensation  LUE Motor Strength 4  RLE Motor Response Purposeful movement  RLE Sensation Full sensation  RLE Motor Strength 3  LLE Motor Response Purposeful movement  LLE Sensation Full sensation  LLE Motor Strength 3  Musculoskeletal  Musculoskeletal (WDL) X  Assistive Device Wheelchair  Generalized Weakness Yes  Weight Bearing Restrictions No  Musculoskeletal Details  RUE Weakness  LUE Weakness  RLE Weakness  LLE Weakness  Integumentary  Integumentary (WDL) X  RN Assisting with Skin Assessment on Admission Christal Lagerstrom Hancock  Skin Color Appropriate for ethnicity;Cyanosis  Cyanosis  (toes)  Skin Condition Dry  Skin Integrity Rash  Abrasion Location Arm  Abrasion Location Orientation Left  Rash Location Leg  Rash Location Orientation Left;Right  Rash Intervention Other (Comment) (hydrocortisone cream)  Skin Turgor Non-tenting   Patient was headed back to bed with NT, Tech moved the bedside table and patient fell backward landing on her bottom. Patient complains of right ankle, hip and shoulder pain

## 2020-05-15 NOTE — Progress Notes (Signed)
Physical Therapy Session Note  Patient Details  Name: Madison Hurst MRN: [id number] Date of Birth: 1960/09/12  Today's Date: 05/15/2020 PT Individual Time: 0800-0915 PT Individual Time Calculation (min): 75 min   Short Term Goals: Week 3:  PT Short Term Goal 1 (Week 3): = to LTGs based on ELOS  Skilled Therapeutic Interventions/Progress Updates:    Patient received supine in bed, agreeable to PT. She denies pain. Patient able to come sit edge of bed with CGA. MaxA to don pants, completing in standing with MinA to come to stand using RW. Initially, patient with posterior lean, able to correct with multimodal cues. Patient transferring to wc via stand pivot with RW and MinA. She was able to brush hair and brush teeth with CGA. Patient propelling herself in wc using B LE 178ft. Very slow and inefficient strokes noted. Patient completing d/c assessment tasks with grossly MinA. She was able to negotiate ramp with CGA and RW as well as 1 step with 1 HR and MinA. She required MinA/ModA for car transfers, mainly due to the height of her car at home (SUV). Patient completing TUG in 1'05" with RW and CGA. Gait speed improving, but remaining slow. She does report that she had a slower gait speed pre-morbid. She continues to require at least CGA for standing balance due to her posterior lean bias and poor reactive reflexes. Patient returning to room in wc, transferring to bed via stand pivot with RW and CGA. Bed alarm on, call light within reach.   Therapy Documentation Precautions:  Precautions Precautions: Fall Precaution Comments: EF 20-25%, L hemiparesis, RA (hands, shoulders, and ankles) Restrictions Weight Bearing Restrictions: No    Therapy/Group: Individual Therapy  Elizebeth Koller, PT, DPT, CBIS  05/15/2020, 12:23 PM

## 2020-05-15 NOTE — Progress Notes (Signed)
Speech Language Pathology Daily Session Note  Patient Details  Name: Ludell Zacarias MRN: [id number] Date of Birth: 1961/03/16  Today's Date: 05/15/2020 SLP Individual Time: 1300-1355 SLP Individual Time Calculation (min): 55 min  Short Term Goals: Week 2: SLP Short Term Goal 1 (Week 2): STGs=LTGs due to ELOS  Skilled Therapeutic Interventions: Pt was seen for skilled ST targeting cognitive skills. Pt's husband was present for session again today. SLP facilitated session with administration of the SLUMS Examination as means of post-test for this admission. She scored 22/30, which is still indicative of mild neurocognitive disorder (WNL=27 or >). However, her score today reflects an improvement of 10 points since initially administered on day of admission to CIR (original score 12/30). Pt also demonstrated ability to use some compensatory strategies such as writing for working memory and problem solving, and some ability to self-correct errors during evaluation, which is significantly improved from functional standpoint since admission. Pt also with noteworthy increase in delayed recall task. SLP further facilitated session with a complex email time based word problem solving task, during which pt required Supervision A for problem solving basic questions, but Min A for more complex. Pt left laying in bed with alarm set and needs within reach, husband still present and radiology entering room ready to receive pt for in-room x-ray. Continue per current plan of care.      Pain Pain Assessment Pain Scale: 0-10 Pain Score: 7  Pain Type: Acute pain Pain Location: Shoulder Pain Orientation: Right Pain Descriptors / Indicators: Aching Pain Frequency: Intermittent Pain Onset: On-going Patients Stated Pain Goal: 0 Pain Intervention(s): Medication (See eMAR)  Therapy/Group: Individual Therapy  [name] 05/15/2020, 12:56 PM

## 2020-05-15 NOTE — Progress Notes (Signed)
Team Conference Report to Patient/Family  Team Conference discussion was reviewed with the patient and caregiver, including goals, any changes in plan of care and target discharge date.  Patient and caregiver express understanding and are in agreement.  The patient has a target discharge date of  (d/c pending reassessment).  Andria Rhein 05/15/2020, 2:01 PM

## 2020-05-15 NOTE — Progress Notes (Signed)
[facility] PHYSICAL MEDICINE & REHABILITATION PROGRESS NOTE   Subjective/Complaints:  Pt a bit apprehensive about going home  Able to get in an dout of bed as well as on an doff the toilet with a walker Husband has limited RUE use due to CVA 1 yr ago   ROS: Patient denies CP, SOB, N/V/D Objective:   No results found. Recent Labs    05/14/20 0501  WBC 11.7*  HGB 9.8*  HCT 31.7*  PLT 532*   Recent Labs    05/13/20 0458 05/14/20 0501  NA 129* 130*  K 3.9 4.0  CL 92* 92*  CO2 23 22  GLUCOSE 146* 145*  BUN 12 14  CREATININE 0.67 0.80  CALCIUM 8.6* 9.1    Intake/Output Summary (Last 24 hours) at 05/15/2020 1761 Last data filed at 05/15/2020 0810 Gross per 24 hour  Intake 657 ml  Output --  Net 657 ml        Physical Exam: Vital Signs Blood pressure 117/76, pulse 90, temperature 97.8 F (36.6 C), temperature source Oral, resp. rate 16, height 5' (1.524 m), weight 46.6 kg, SpO2 100 %.  General: No acute distress Mood and affect are appropriate Heart: Regular rate and rhythm no rubs murmurs or extra sounds Lungs: Clear to auscultation, breathing unlabored, no rales or wheezes Abdomen: Positive bowel sounds, soft nontender to palpation, nondistended Extremities: No clubbing, cyanosis, or edema Skin: No evidence of breakdown, no evidence of rash  Musc: No edema in extremities.  Left foot, ankle sl tender Neuro: Alert, oriented to person, place, date/month. Follows commands. Reasonable insight Motor: 4+/5 RUE and RLE and 4/5 LUE and LLE  Assessment/Plan: 1. Functional deficits which require 3+ hours per day of interdisciplinary therapy in a comprehensive inpatient rehab setting.  Physiatrist is providing close team supervision and 24 hour management of active medical problems listed below.  Physiatrist and rehab team continue to assess barriers to discharge/monitor patient progress toward functional and medical goals  Care Tool:  Bathing    Body parts  bathed by patient: Right arm,Left arm,Chest,Abdomen,Front perineal area,Right upper leg,Left upper leg,Face,Right lower leg,Left lower leg   Body parts bathed by helper: Buttocks     Bathing assist Assist Level: Minimal Assistance - Patient > 75%     Upper Body Dressing/Undressing Upper body dressing   What is the patient wearing?: Pull over shirt    Upper body assist Assist Level: Minimal Assistance - Patient > 75%    Lower Body Dressing/Undressing Lower body dressing      What is the patient wearing?: Underwear/pull up     Lower body assist Assist for lower body dressing: Moderate Assistance - Patient 50 - 74%     Toileting Toileting    Toileting assist Assist for toileting: Maximal Assistance - Patient 25 - 49%     Transfers Chair/bed transfer  Transfers assist     Chair/bed transfer assist level: Minimal Assistance - Patient > 75% Chair/bed transfer assistive device: Programmer, multimedia   Ambulation assist      Assist level: Moderate Assistance - Patient 50 - 74% Assistive device: Walker-rolling Max distance: 12'   Walk 10 feet activity   Assist     Assist level: Minimal Assistance - Patient > 75% Assistive device: Walker-rolling   Walk 50 feet activity   Assist Walk 50 feet with 2 turns activity did not occur: Safety/medical concerns         Walk 150 feet activity   Assist Walk  150 feet activity did not occur: N/A (pt does not ambulate this distance at basline)         Walk 10 feet on uneven surface  activity   Assist Walk 10 feet on uneven surfaces activity did not occur: Safety/medical concerns         Wheelchair     Assist Will patient use wheelchair at discharge?: Yes Type of Wheelchair: Power    Wheelchair assist level: Moderate Assistance - Patient 50 - 74% Max wheelchair distance: 200    Wheelchair 50 feet with 2 turns activity    Assist        Assist Level: Moderate Assistance -  Patient 50 - 74%   Wheelchair 150 feet activity     Assist      Assist Level: Moderate Assistance - Patient 50 - 74%   Blood pressure 117/76, pulse 90, temperature 97.8 F (36.6 C), temperature source Oral, resp. rate 16, height 5' (1.524 m), weight 46.6 kg, SpO2 100 %.   Medical Problem List and Plan: 1.  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 Formulized individual treatment plan based on medical history, underlying problem and comorbidities.  2.  Antithrombotics: -DVT/anticoagulation: Eliquis 5 mg twice daily, d/ced due to GI bleed and increasing hematoma, Neuro aware.  - Neuro will f/u as OP to determine further tx  Lower extremity Dopplers negative             -antiplatelet therapy: Aspirin 81 mg daily 3. Pain Management: Continue Hydrocodone as needed             2/7: does have some pain in rash in lower extremities. She has been using hydrocodone twice per day- says she uses it once per day at home. Says she uses it for diffuse arthitic pains. She has not tried voltaren gel but is willing to.   2/8: diclofenac gel helped, continue  2/9: has not required Norco in 2 days! Continue diclofenac gel   2/13: mentation appears much improved. Suspect that norco was the main culprit. Pt says she feels better also. Continue to observe 4. Mood: Cymbalta 60 mg daily, Wellbutrin 150 mg daily, Ativan 0.5 mg every 8 hours as needed- d/ced d/t AMS             -antipsychotic agents: N/A 5. Neuropsych: This patient is capable of making decisions on her own behalf. 6. Skin/Wound Care: Routine skin checks 7. Fluids/Electrolytes/Nutrition: Routine in and outs.   8.  History of PAF/CAD/PFO.  30-day cardiac event monitor per cardiology services Dr.Kardie  Tobb.  Continue Eliquis for now.  If EF improves and no atrial fibrillation in 3 months plan to stop Eliquis and restart Plavix.             Monitor with increased activity 9.  Transaminitis.  Follow-up gastroenterology service Dr. Henrene Pastor.               LFTs elevated on 1/31, AST and ALT trending downward on 2/7, repeat CMP ordered for 2/14 show further improvement in AST and ALT  10.  Hyperlipidemia.  Crestor on hold due to elevated LFTs. 11.  Rheumatoid arthritis: Low-dose prednisone as well as weekly Orencia (was on Ansid at home flurbiprofen)  Some increased joint pain but no increased swelling noted today  12.  Diabetes mellitus.  Hemoglobin A1c 6.1.  SSI.            CBG (last 3)  Recent Labs    05/14/20 1634 05/14/20 2141 05/15/20 0600  GLUCAP 189* 154* 128*     2/16- controlled  13.  Hypothyroidism: Synthroid 14.  Essential hypertension.. Blood pressure had remained a bit soft Toprol-XL resumed at 12.5 mg daily 04/26/2020   Vitals:   05/14/20 2003 05/15/20 0435  BP: 91/75 117/76  Pulse: (!) 52 90  Resp: 16 16  Temp: 98.7 F (37.1 C) 97.8 F (36.6 C)  SpO2: 100% 100%   Controlled 2/16 15.  Constipation: multiple stools post sorbitol 1/29 16. Sleep disturbance: melatonin 73m started HS  Improving 17.  GI bleed with drop in Hgb, suspect lower GI bleed given bright red blood  Hb 10.1 on 2/5, cont to monitor, 2/15 stable Hgb 9.8 18. Tachycardia:improved  continue Toprol.  19. Confusion:   Resolved      20.  Hypo Na+ no obvious meds, fluid intake is not excessive , no IVF  , repeat is stable  LOS: 19 days A FACE TO FACE EVALUATION WAS PERFORMED  ACharlett Blake2/16/2022, 9:23 AM

## 2020-05-15 NOTE — Progress Notes (Signed)
Occupational Therapy Discharge Summary  Patient Details  Name: Madison Hurst MRN: [id number] Date of Birth: Mar 16, 1961    Patient has met 12 of 14 long term goals due to improved activity tolerance, improved balance, postural control, ability to compensate for deficits, functional use of  LEFT upper and LEFT lower extremity and improved attention.  Patient to discharge at [facility] Assist level.  Patient's care partner is independent to provide the necessary physical and cognitive assistance at discharge.    Reasons goals not met: Pt continues to need mod assist for dynamic standing balance as well as min assist or more for dynamic sitting balance  Recommendation:  Patient will benefit from ongoing skilled OT services in home health setting to continue to advance functional skills in the area of BADL and Reduce care partner burden.  Pt continues to need variable assist at times for functional transfers and selfcare tasks secondary to history of RA in her hands, shoulders, and feet.  This is also impacted by her cognition and attention level, which has been influenced lately by her urinary tract infection.  Feel she will benefit from continued [place] for further work on ADL independence as well as environmental modifications that may help her independence related to her current CVA and arthritic limitations.  Recommend 24 hour physical assist at this time which spouse voices understanding, and along with other family members, will provide.    Equipment: No equipment provided  Reasons for discharge: treatment goals met and discharge from hospital  Patient/family agrees with progress made and goals achieved: Yes  OT Discharge Precautions/Restrictions  Precautions Precautions: Fall Precaution Comments: EF 20-25%, L hemiparesis, RA (hands, shoulders, and ankles) Restrictions Weight Bearing Restrictions: No  Pain Pain Assessment Pain Scale: Faces Pain Score: 4  Faces Pain  Scale: Hurts a little bit Pain Type: Chronic pain Pain Location: Ankle Pain Orientation: Right Pain Descriptors / Indicators: Discomfort Pain Onset: On-going Pain Intervention(s): Repositioned;Emotional support ADL ADL Equipment Provided: Dressing stick,Feeding equipment Eating: Modified independent Where Assessed-Eating: Wheelchair Grooming: Supervision/safety Where Assessed-Grooming: Wheelchair Upper Body Bathing: Supervision/safety Where Assessed-Upper Body Bathing: Shower,Chair Lower Body Bathing: Minimal assistance Where Assessed-Lower Body Bathing: Wheelchair Upper Body Dressing: Minimal assistance Where Assessed-Upper Body Dressing: Edge of bed Lower Body Dressing: Moderate assistance Where Assessed-Lower Body Dressing: Edge of bed Toileting: Moderate assistance Where Assessed-Toileting: Bedside Commode Toilet Transfer: Minimal assistance Toilet Transfer Method: Stand pivot Toilet Transfer Equipment: Bedside commode Tub/Shower Transfer: Minimal assistance Tub/Shower Transfer Method: Stand pivot Tub/Shower Equipment: Facilities manager: Minimal assistance Social research officer, government Method: Heritage manager: Grab bars Vision Baseline Vision/History: No visual deficits Wears Glasses: At all times Patient Visual Report: No change from baseline Vision Assessment?: Yes Ocular Range of Motion: Within Functional Limits Alignment/Gaze Preference: Within Defined Limits Tracking/Visual Pursuits: Able to track stimulus in all quads without difficulty Convergence: Within functional limits Visual Fields: No apparent deficits Perception  Perception: Within Functional Limits Praxis Praxis: Intact Cognition Overall Cognitive Status: Impaired/Different from baseline Arousal/Alertness: Awake/alert Orientation Level: Disoriented to time Attention: Sustained Focused Attention: Appears intact Sustained Attention: Appears intact Selective  Attention: Impaired Selective Attention Impairment: Functional basic;Verbal basic Alternating Attention: Impaired Alternating Attention Impairment: Verbal complex;Functional complex Memory: Impaired Memory Impairment: Decreased recall of new information;Retrieval deficit Awareness Impairment: Emergent impairment Problem Solving: Impaired Organizing: Impaired Organizing Impairment: Verbal complex;Functional complex Self Monitoring: Impaired Safety/Judgment: Impaired Sensation Sensation Light Touch: Appears Intact Hot/Cold: Appears Intact Proprioception: Appears Intact Stereognosis: Not tested Additional Comments: Light touch intact  with gross testing.  pt does report some numbness in her right fingertips. Coordination Gross Motor Movements are Fluid and Coordinated: No Fine Motor Movements are Fluid and Coordinated: No Coordination and Movement Description: impaired due to hx of RA B UE shoulders and hands.  Needs built up handles to use comb and toothbrush efficiently as well as utensils.  Decreased abilty to manipulat clothing as well for pulling over hips. Motor  Motor Motor: Hemiplegia;Abnormal postural alignment and control Motor - Discharge Observations: mild LUE and LLE hemiparesis Mobility  Bed Mobility Bed Mobility: Supine to Sit;Sit to Supine Supine to Sit: Minimal Assistance - Patient > 75% Sit to Supine: Minimal Assistance - Patient > 75% Transfers Sit to Stand: Minimal Assistance - Patient > 75% Stand to Sit: Minimal Assistance - Patient > 75%  Trunk/Postural Assessment  Cervical Assessment Cervical Assessment: Exceptions to [facility] (cervical protraction and flexion) Thoracic Assessment Thoracic Assessment: Exceptions to [facility] (thoracic rounding) Lumbar Assessment Lumbar Assessment: Exceptions to [facility] (posterior pelvic tilt) Postural Control Protective Responses: impaired with poor balance recovery strategies, LOB posteriorly  Balance Balance Balance Assessed:  Yes Static Sitting Balance Static Sitting - Level of Assistance: 4: Min assist Dynamic Sitting Balance Dynamic Sitting - Balance Support: Feet unsupported;During functional activity Dynamic Sitting - Level of Assistance: 4: Min assist Static Standing Balance Static Standing - Balance Support: During functional activity;Bilateral upper extremity supported Static Standing - Level of Assistance: 4: Min assist Dynamic Standing Balance Dynamic Standing - Balance Support: During functional activity;Bilateral upper extremity supported Dynamic Standing - Level of Assistance: 4: Min assist Extremity/Trunk Assessment RUE Assessment RUE Assessment: Exceptions to [facility] Passive Range of Motion (PROM) Comments: Shoulder flexion 0-90 degrees with end range pain and limitations secondary to RA Active Range of Motion (AROM) Comments: Shoulder flexion to approximately 80 degrees, elbow flexion 0-110 degrees. Gross grasp and release 90% of normal ROM at MP's but limted at PIP and DIPs General Strength Comments: 3/5 for shoulder flexion, 2+/5 elbow flexion/extension, grasp 3/5 as well.  Decreased FM coordination secondary to degenerative changes with arthritis.  Pt with resting hand splints for night time wear to assist with reduction of deformity. LUE Assessment Active Range of Motion (AROM) Comments: shoulder flexion 0-80 degrees with elbow flexion 0-120 degrees.  Gross grasp and release present to 90% of normal ROM at MPs but limited at [facility] and DIPs.  increased adduction of digits as well secondary to degenerative changes. General Strength Comments: shoulder 2+/5, elbow flexion/extension 3/5, grasp 3/5.   Decreased FM coordination noted secondary degenerative changes in the digits.Pt with resting hand splints for night time wear to assist with reduction of deformity.   Julie Nay OTR/L 05/15/2020, 5:32 PM

## 2020-05-15 NOTE — Progress Notes (Signed)
Speech Language Pathology Discharge Summary  Patient Details  Name: Madison Hurst MRN: [id number] Date of Birth: [birth year]   Patient has met 4 of 4 long term goals.  Patient to discharge at overall Supervision;Min level.  Reasons goals not met: n/a   Clinical Impression/Discharge Summary:   Pt made functional gains and met 4 out of 4 long term goals this admission. Pt currently requires Supervision to Min assist for familiar complex tasks due to cognitive impairments impacting her short term memory, selective attention, complex problem solving, and emergent awareness. As a result, pt will require 24/7 supervision for her greatest safety at discharge. Pt has demonstrated improved use of compensatory memory strategies and carryover of daily new information, as well as problem solving, awareness and attention to tasks. However, given that higher level cognitive deficits are still evident and impacting her daily function, recommend pt continue to receive skilled ST services upon discharge. Pt and family education is complete at this time.    Care Partner:  Caregiver Able to Provide Assistance: Yes  Type of Caregiver Assistance: Cognitive  Recommendation:  Home Health SLP;24 hour supervision/assistance  Rationale for SLP Follow Up: Maximize functional communication;Maximize cognitive function and independence;Reduce caregiver burden   Equipment: none   Reasons for discharge: Discharged from hospital   Patient/Family Agrees with Progress Made and Goals Achieved: Yes    Arbutus Leas 05/16/2020, 12:06 PM

## 2020-05-15 NOTE — Progress Notes (Signed)
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 No joint swelling, chronic MCP , PIP swelling unchanged No leg length discrepancy No pain with neck ROM  Right knee and hip xray reviewed, report reviewed no fx  A/P 1.  Fall due to reduced balance associated with prior CVA, no new neuro changes, no head trauma  2.  RIght hip pain, mild pain with R hip int rotation c/w prior history of RA, no fx  Ok to resume full therapy program

## 2020-05-15 NOTE — Patient Care Conference (Signed)
Inpatient RehabilitationTeam Conference and Plan of Care Update Date: 05/15/2020   Time: 10:56 AM    Patient Name: Madison Hurst      Medical Record Number: [id number]  Date of Birth: 11-29-1960 Sex: Female         Room/Bed: 4W03C/4W03C-01 Payor Info: Payor: [employer] MEDICARE / Plan: [employer] MEDICARE CHOICE PPO / Product Type: *No Product type* /    Admit Date/Time:  04/26/2020  6:29 PM  Primary Diagnosis:  Right middle cerebral artery stroke [facility])  Hospital Problems: Principal Problem:   Right middle cerebral artery stroke (HCC) Active Problems:   Depression with anxiety   Acute GI bleeding   Sleep disturbance   Labile blood glucose   Benign essential HTN   Hypoglycemia   Diabetes mellitus type 2 in nonobese [facility])    Expected Discharge Date: Expected Discharge Date:  (d/c pending reassessment)  Team Members Present: Physician leading conference: Dr. Claudette Laws Care Coodinator Present: Chana Bode, RN, BSN, CRRN;Christina Rome, BSW Nurse Present: Adam Phenix, LPN PT Present: Merry Lofty, PT OT Present: Perrin Maltese, OT SLP Present: Suzzette Righter, CF-SLP PPS Coordinator present : Fae Pippin, SLP     Current Status/Progress Goal Weekly Team Focus  Bowel/Bladder             Swallow/Nutrition/ Hydration             ADL's   Supervision for UB bathing with min to mod for UB dressing.  Min assist for LB bathing with mod assist for LB dressing as well.  Min to mod assist for stand pivot transfers with use of the RW for support.  Still with confusion and decreased memory.  min to mod assist  selfcare retraining, transfer training, AE/DME education, therapeutic exercise, neuromuscular re-education, pt/family education   Mobility   CGA/spv bed mobility, MinA/MOdA STS and stand pivot using RW, intermittent posterior lean, poor recovery strategies, CGA/MinA gait up to 32ft, CGA wc mob in manual hemi-height or up to ModA for powerwc (mainly because  this chair doesn't fit her)  CGA overall at ambulatory level + wc mob  dc planning, wc eval, family ed   Communication             Safety/Cognition/ Behavioral Observations  Min A  Min-supervision  complex problem solving, recall with strategies, eror awareness   Pain             Skin               Discharge Planning:  Discharging home with spouse. 1 level home, 1 step to enter   Team Discussion: Patient is confused and functional status and cognitive status fluctuate. Patient has episodes of "hot flashes" that correspond with increased confusion. MD noted adjustment to cymbalta. Off Eliquis due to intercranial and GI bleeding. LFTs improved. DM and HTN controlled. Patient on target to meet rehab goals: Currently with right hip pain after second fall. Patient with posterior lean and inability to correct loss of balance/corrrect lean and poor safety awareness. Requires supervision for upper body care and min assist for lower body care and dressing due to OA limitations and min- mod assist for toileting. Min assist goals set for discharge.  *See Care Plan and progress notes for long and short-term goals.   Revisions to Treatment Plan:  Power chair recommended HS splints  Teaching Needs: Transfers, toileting, safety/cues, medications, etc.  Current Barriers to Discharge: Decreased caregiver support and Home enviroment access/layout  Possible Resolutions to Barriers: Family  education     Medical Summary Current Status: sweating, may be related to cymbalta dose, joint pain intermittent on home dose prednisone , fall     Possible Resolutions to [employer] Focus: Reduce cymbalta, check xray to r/o hip fx, labwork to f/u anemia   Continued Need for Acute Rehabilitation Level of Care: The patient requires daily medical management by a physician with specialized training in physical medicine and rehabilitation for the following reasons: Direction of a multidisciplinary physical  rehabilitation program to maximize functional independence : Yes Medical management of patient stability for increased activity during participation in an intensive rehabilitation regime.: Yes Analysis of laboratory values and/or radiology reports with any subsequent need for medication adjustment and/or medical intervention. : Yes   I attest that I was present, lead the team conference, and concur with the assessment and plan of the team.   Chana Bode B 05/15/2020, 1:52 PM

## 2020-05-15 NOTE — Progress Notes (Signed)
Physical Therapy Discharge Summary  Patient Details  Name: Madison Hurst MRN: [id number] Date of Birth: [birth year]   Patient has met 6 of 9 long term goals due to improved activity tolerance, improved balance, improved postural control, increased strength, increased range of motion, decreased pain, ability to compensate for deficits, functional use of  left upper extremity and left lower extremity, improved attention, improved awareness and improved coordination.  Patient to discharge at a wheelchair level [place].   Patient's care partner is independent to provide the necessary physical and cognitive assistance at discharge.  Reasons goals not met: Patient has made slow, but steady progress toward her goals. Goal achievement was impeded by development of UTI impacting her cognition and therefore her ability to participate in meaningful therapy. With her fluctuating cognition, it is safest for patient to ambulate with MinA. She does require MinA for a sit <>stand transfer from her hemi-height wc because of the low surface. Family has been educated on patients functional mobility needs.   Recommendation:  Patient will benefit from ongoing skilled PT services in home health setting to continue to advance safe functional mobility, address ongoing impairments in coordination, gait, balance strategies, attention/awareness, wc mobility, and minimize fall risk.  Equipment: d/cing home with hemi-height manual wc with custom power wc ordered for long term use   Reasons for discharge: treatment goals met and discharge from hospital  Patient/family agrees with progress made and goals achieved: Yes  PT Discharge Precautions/Restrictions Precautions Precautions: Fall Precaution Comments: EF 20-25%, L hemiparesis, RA (hands, shoulders, and ankles) Restrictions Weight Bearing Restrictions: No Vision/Perception  Vision - Assessment Ocular Range of Motion: Within Functional  Limits Alignment/Gaze Preference: Within Defined Limits Tracking/Visual Pursuits: Able to track stimulus in all quads without difficulty Convergence: Within functional limits Perception Perception: Within Functional Limits Praxis Praxis: Intact  Cognition Overall Cognitive Status: Impaired/Different from baseline Arousal/Alertness: Awake/alert Orientation Level: Oriented X4 Focused Attention: Appears intact Sustained Attention: Appears intact Selective Attention: Impaired Alternating Attention: Impaired Memory: Impaired Awareness: Impaired Problem Solving: Impaired Organizing: Impaired Self Monitoring: Impaired Safety/Judgment: Appears intact Sensation Sensation Light Touch: Appears Intact (reports N/T in R foot "sometimes") Hot/Cold: Appears Intact Proprioception: Appears Intact Stereognosis: Appears Intact Coordination Gross Motor Movements are Fluid and Coordinated: No Fine Motor Movements are Fluid and Coordinated: No Coordination and Movement Description: impaired due to hx of RA B UE shoulders and hands, L hemiparesis, impaired midline orientation with posterior bias Finger Nose Finger Test: decreased full ROM due to RA Heel Shin Test: symmetrical and WFL although slower with L LE Motor  Motor Motor: Hemiplegia;Abnormal postural alignment and control Motor - Skilled Clinical Observations: mild left hemiparesis  Mobility Bed Mobility Bed Mobility: Rolling Right;Rolling Left;Supine to Sit;Sit to Supine Rolling Right: Independent Rolling Left: Independent Supine to Sit: Contact Guard/Touching assist Sit to Supine: Supervision/Verbal cueing Transfers Transfers: Sit to Stand;Stand to Sit;Stand Pivot Transfers Sit to Stand: Contact Guard/Touching assist Stand to Sit: Contact Guard/Touching assist Stand Pivot Transfers: Contact Guard/Touching assist Stand Pivot Transfer Details: Verbal cues for precautions/safety;Verbal cues for technique;Verbal cues for  sequencing Transfer (Assistive device): Rolling walker Locomotion  Gait Ambulation: Yes Gait Assistance: Contact Guard/Touching assist Gait Distance (Feet): 30 Feet Assistive device: Rolling walker Gait Assistance Details: Verbal cues for sequencing;Verbal cues for technique;Verbal cues for precautions/safety;Verbal cues for safe use of DME/AE Gait Gait: Yes Gait Pattern: Impaired Gait Pattern: Step-through pattern;Decreased step length - right;Decreased step length - left;Lateral trunk lean to right;Poor foot clearance - left;Poor foot clearance -  right;Narrow base of support Gait velocity: severely decreased (patient reports gait speed is premordbid) Stairs / Additional Locomotion Stairs: Yes Stair Management Technique: Two rails Ramp: Nurse, mental health Mobility: Yes Wheelchair Assistance: Chartered loss adjuster: Both lower extermities Wheelchair Parts Management: Supervision/cueing Distance: 150  Trunk/Postural Assessment  Cervical Assessment Cervical Assessment: Exceptions to [facility] (forward head) Thoracic Assessment Thoracic Assessment: Exceptions to [facility] (increased kyphosis) Lumbar Assessment Lumbar Assessment: Exceptions to [facility] (posterior pelvic tilt) Postural Control Postural Control: Deficits on evaluation Trunk Control: intermittent posterior lean usually when first standing Protective Responses: impaired with poor balance recovery strategies Postural Limitations: decreased  Balance Balance Balance Assessed: Yes Standardized Balance Assessment Standardized Balance Assessment: Timed Up and Go Test Timed Up and Go Test TUG: Normal TUG Normal TUG (seconds): 65 Static Sitting Balance Static Sitting - Balance Support: Feet supported Static Sitting - Level of Assistance: 5: Stand by assistance Dynamic Sitting Balance Dynamic Sitting - Balance Support: Feet unsupported;During functional  activity Dynamic Sitting - Level of Assistance: 5: Stand by assistance Static Standing Balance Static Standing - Balance Support: During functional activity;Bilateral upper extremity supported Static Standing - Level of Assistance: 4: Min assist Dynamic Standing Balance Dynamic Standing - Balance Support: During functional activity;Bilateral upper extremity supported Dynamic Standing - Level of Assistance: 4: Min assist Extremity Assessment      RLE Assessment RLE Assessment: Exceptions to [facility] Active Range of Motion (AROM) Comments: WFL General Strength Comments: ankle full AROM, no resistance applied due to pain RLE Strength Right Hip Flexion: 4-/5 Right Hip ABduction: 4/5 Right Hip ADduction: 4/5 Right Knee Flexion: 4/5 Right Knee Extension: 4/5 Right Ankle Dorsiflexion: 3/5 Right Ankle Plantar Flexion: 3/5 LLE Assessment LLE Assessment: Exceptions to [facility] Active Range of Motion (AROM) Comments: WFL General Strength Comments: Ankle full ROM, no resistance applied due to pain LLE Strength Left Hip Flexion: 4-/5 Left Hip ABduction: 4/5 Left Hip ADduction: 4/5 Left Knee Flexion: 4-/5 Left Knee Extension: 4/5 Left Ankle Dorsiflexion: 3/5 Left Ankle Plantar Flexion: 3/5    Debbora Dus 05/15/2020, 8:59 AM

## 2020-05-15 NOTE — Discharge Instructions (Signed)
Inpatient Rehab Discharge Instructions  Tashiba Timoney Discharge date and time: No discharge date for patient encounter.   Activities/Precautions/ Functional Status: Activity: activity as tolerated Diet: diabetic diet Wound Care: Routine skin checks Functional status:  ___ No restrictions     ___ Walk up steps independently ___ 24/7 supervision/assistance   ___ Walk up steps with assistance ___ Intermittent supervision/assistance  ___ Bathe/dress independently ___ Walk with walker     _x__ Bathe/dress with assistance ___ Walk Independently    ___ Shower independently ___ Walk with assistance    ___ Shower with assistance ___ No alcohol     ___ Return to work/school ________  COMMUNITY REFERRALS UPON DISCHARGE:    Home Health:   PT     OT     ST                    Agency: [facility]  Phone: [phone]     Special Instructions: No driving smoking or alcohol  Follow-up with gastroenterology service Dr. Marina Goodell in regards to elevated LFTs  [phone]  Follow up DR Thomasene Ripple [phone] Cardiology services  STROKE/TIA DISCHARGE INSTRUCTIONS SMOKING Cigarette smoking nearly doubles your risk of having a stroke & is the single most alterable risk factor  If you smoke or have smoked in the last 12 months, you are advised to quit smoking for your health.  Most of the excess cardiovascular risk related to smoking disappears within a year of stopping.  Ask you doctor about anti-smoking medications   Quit Line: 1-800-QUIT NOW  Free Smoking Cessation Classes [phone]  CHOLESTEROL Know your levels; limit fat & cholesterol in your diet  Lipid Panel     Component Value Date/Time   CHOL 159 04/22/2020 1838   TRIG 111 04/22/2020 1838   HDL 49 04/22/2020 1838   CHOLHDL 3.2 04/22/2020 1838   VLDL 22 04/22/2020 1838   LDLCALC 88 04/22/2020 1838      Many patients benefit from treatment even if their cholesterol is at goal.  Goal: Total  Cholesterol (CHOL) less than 160  Goal:  Triglycerides (TRIG) less than 150  Goal:  HDL greater than 40  Goal:  LDL (LDLCALC) less than 100   BLOOD PRESSURE American Stroke Association blood pressure target is less that 120/80 mm/Hg  Your discharge blood pressure is:  BP: 112/82  Monitor your blood pressure  Limit your salt and alcohol intake  Many individuals will require more than one medication for high blood pressure  DIABETES (A1c is a blood sugar average for last 3 months) Goal HGBA1c is under 7% (HBGA1c is blood sugar average for last 3 months)  Diabetes:     Lab Results  Component Value Date   HGBA1C 6.1 (H) 04/22/2020     Your HGBA1c can be lowered with medications, healthy diet, and exercise.  Check your blood sugar as directed by your physician  Call your physician if you experience unexplained or low blood sugars.  PHYSICAL ACTIVITY/REHABILITATION Goal is 30 minutes at least 4 days per week  Activity: Increase activity slowly, Therapies: Physical Therapy: Home Health Return to work:   Activity decreases your risk of heart attack and stroke and makes your heart stronger.  It helps control your weight and blood pressure; helps you relax and can improve your mood.  Participate in a regular exercise program.  Talk with your doctor about the best form of exercise for you (dancing, walking, swimming, cycling).  DIET/WEIGHT  Goal is to maintain a healthy weight  Your discharge diet is:  Diet Order            Diet heart healthy/carb modified Room service appropriate? Yes; Fluid consistency: Thin  Diet effective now                 liquids Your height is:  Height: 5' (152.4 cm) Your current weight is: Weight: 56.6 kg Your Body Mass Index (BMI) is:  BMI (Calculated): 24.37  Following the type of diet specifically designed for you will help prevent another stroke.  Your goal weight range is:    Your goal Body Mass Index (BMI) is 19-24.  Healthy food habits can  help reduce 3 risk factors for stroke:  High cholesterol, hypertension, and excess weight.  RESOURCES Stroke/Support Group:  Call [phone]   STROKE EDUCATION PROVIDED/REVIEWED AND GIVEN TO PATIENT Stroke warning signs and symptoms How to activate emergency medical system (call 911). Medications prescribed at discharge. Need for follow-up after discharge. Personal risk factors for stroke. Pneumonia vaccine given:  Flu vaccine given:  My questions have been answered, the writing is legible, and I understand these instructions.  I will adhere to these goals & educational materials that have been provided to me after my discharge from the hospital.      My questions have been answered and I understand these instructions. I will adhere to these goals and the provided educational materials after my discharge from the hospital.  Patient/Caregiver Signature _______________________________ Date __________  Clinician Signature _______________________________________ Date __________  Please bring this form and your medication list with you to all your follow-up doctor's appointments.    =======================================  Information on my medicine - ELIQUIS (apixaban)  This medication education was reviewed with me or my healthcare representative as part of my discharge preparation   Why was Eliquis prescribed for you? Eliquis was prescribed for you to reduce the risk of a blood clot forming that can cause a stroke if you have a medical condition called atrial fibrillation (a type of irregular heartbeat).  What do You need to know about Eliquis ? Take your Eliquis TWICE DAILY - one tablet in the morning and one tablet in the evening with or without food. If you have difficulty swallowing the tablet whole please discuss with your pharmacist how to take the medication safely.  Take Eliquis exactly as prescribed by your doctor and DO NOT stop taking Eliquis without talking to  the doctor who prescribed the medication.  Stopping may increase your risk of developing a stroke.  Refill your prescription before you run out.  After discharge, you should have regular check-up appointments with your healthcare provider that is prescribing your Eliquis.  In the future your dose may need to be changed if your kidney function or weight changes by a significant amount or as you get older.  What do you do if you miss a dose? If you miss a dose, take it as soon as you remember on the same day and resume taking twice daily.  Do not take more than one dose of ELIQUIS at the same time to make up a missed dose.  Important Safety Information A possible side effect of Eliquis is bleeding. You should call your healthcare provider right away if you experience any of the following: ? Bleeding from an injury or your nose that does not stop. ? Unusual colored urine (red or dark brown) or unusual colored stools (red or black). ? Unusual bruising  for unknown reasons. ? A serious fall or if you hit your head (even if there is no bleeding).  Some medicines may interact with Eliquis and might increase your risk of bleeding or clotting while on Eliquis. To help avoid this, consult your healthcare provider or pharmacist prior to using any new prescription or non-prescription medications, including herbals, vitamins, non-steroidal anti-inflammatory drugs (NSAIDs) and supplements.  This website has more information on Eliquis (apixaban): http://www.eliquis.com/eliquis/home

## 2020-05-16 LAB — GLUCOSE, CAPILLARY
Glucose-Capillary: 101 mg/dL — ABNORMAL HIGH (ref 70–99)
Glucose-Capillary: 127 mg/dL — ABNORMAL HIGH (ref 70–99)

## 2020-05-16 NOTE — Progress Notes (Signed)
[facility] PHYSICAL MEDICINE & REHABILITATION PROGRESS NOTE   Subjective/Complaints:  Feels sore "all over " no other issues overnight  Husband coming in for teaching today   ROS: Patient denies CP, SOB, N/V/D Objective:   DG Knee Right Port  Result Date: 05/15/2020 CLINICAL DATA:  Fall with right knee. EXAM: PORTABLE RIGHT KNEE - 1-2 VIEW COMPARISON:  None. FINDINGS: No fracture. No joint effusion. No dislocation. No suspicious focal osseous lesions. No significant arthropathy. Vascular calcifications in the posterior soft tissues. No radiopaque foreign bodies. IMPRESSION: No right knee fracture, joint effusion or malalignment. Electronically Signed   By: Delbert Phenix M.D.   On: 05/15/2020 14:19   DG HIP UNILAT WITH PELVIS 2-3 VIEWS RIGHT  Result Date: 05/15/2020 CLINICAL DATA:  Fall in bathroom with right hip pain EXAM: DG HIP (WITH OR WITHOUT PELVIS) 2-3V RIGHT COMPARISON:  None. FINDINGS: No pelvic fracture or diastasis. No right hip fracture or dislocation. No focal osseous lesions. No significant right hip arthropathy. Mild degenerative changes in the visualized lower lumbar spine. No radiopaque foreign bodies. IMPRESSION: No fracture or malalignment in the right hip. Electronically Signed   By: Delbert Phenix M.D.   On: 05/15/2020 14:18   Recent Labs    05/14/20 0501  WBC 11.7*  HGB 9.8*  HCT 31.7*  PLT 532*   Recent Labs    05/14/20 0501  NA 130*  K 4.0  CL 92*  CO2 22  GLUCOSE 145*  BUN 14  CREATININE 0.80  CALCIUM 9.1    Intake/Output Summary (Last 24 hours) at 05/16/2020 6226 Last data filed at 05/16/2020 0744 Gross per 24 hour  Intake 600 ml  Output --  Net 600 ml        Physical Exam: Vital Signs Blood pressure 108/71, pulse (!) 104, temperature 97.6 F (36.4 C), resp. rate 18, height 5' (1.524 m), weight 46.6 kg, SpO2 95 %.  General: No acute distress Mood and affect are appropriate Heart: Regular rate and rhythm no rubs murmurs or extra  sounds Lungs: Clear to auscultation, breathing unlabored, no rales or wheezes Abdomen: Positive bowel sounds, soft nontender to palpation, nondistended Extremities: No clubbing, cyanosis, or edema Skin: No evidence of breakdown, no evidence of rash  Musc: No edema in extremities.  Left foot, ankle sl tender Neuro: Alert, oriented to person, place, date/month. Follows commands. Reasonable insight Motor: 4+/5 RUE and RLE and 4/5 LUE and LLE  Assessment/Plan: 1. Functional deficits which require 3+ hours per day of interdisciplinary therapy in a comprehensive inpatient rehab setting.  Physiatrist is providing close team supervision and 24 hour management of active medical problems listed below.  Physiatrist and rehab team continue to assess barriers to discharge/monitor patient progress toward functional and medical goals  Care Tool:  Bathing    Body parts bathed by patient: Right arm,Left arm,Chest,Abdomen,Front perineal area,Right upper leg,Left upper leg,Face,Right lower leg,Left lower leg   Body parts bathed by helper: Buttocks     Bathing assist Assist Level: Minimal Assistance - Patient > 75%     Upper Body Dressing/Undressing Upper body dressing   What is the patient wearing?: Pull over shirt    Upper body assist Assist Level: Minimal Assistance - Patient > 75%    Lower Body Dressing/Undressing Lower body dressing      What is the patient wearing?: Underwear/pull up,Pants     Lower body assist Assist for lower body dressing: Moderate Assistance - Patient 50 - 74%     Toileting Toileting  Toileting assist Assist for toileting: Moderate Assistance - Patient 50 - 74%     Transfers Chair/bed transfer  Transfers assist     Chair/bed transfer assist level: Minimal Assistance - Patient > 75% Chair/bed transfer assistive device: Arboriculturist assist      Assist level: Contact Guard/Touching assist Assistive device:  Walker-rolling Max distance: 25   Walk 10 feet activity   Assist     Assist level: Contact Guard/Touching assist Assistive device: Walker-rolling   Walk 50 feet activity   Assist Walk 50 feet with 2 turns activity did not occur: Safety/medical concerns  Assist level: Minimal Assistance - Patient > 75% Assistive device: Walker-rolling    Walk 150 feet activity   Assist Walk 150 feet activity did not occur: N/A         Walk 10 feet on uneven surface  activity   Assist Walk 10 feet on uneven surfaces activity did not occur: Safety/medical concerns   Assist level: Contact Guard/Touching assist Assistive device: Photographer Will patient use wheelchair at discharge?: Yes Type of Wheelchair: Manual    Wheelchair assist level: Supervision/Verbal cueing Max wheelchair distance: 150    Wheelchair 50 feet with 2 turns activity    Assist        Assist Level: Supervision/Verbal cueing   Wheelchair 150 feet activity     Assist      Assist Level: Supervision/Verbal cueing   Blood pressure 108/71, pulse (!) 104, temperature 97.6 F (36.4 C), resp. rate 18, height 5' (1.524 m), weight 46.6 kg, SpO2 95 %.   Medical Problem List and Plan: 1.  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  Antithrombotics: -DVT/anticoagulation: Eliquis 5 mg twice daily, d/ced due to GI bleed and increasing hematoma, Neuro aware.  - Neuro will f/u as OP to determine further tx  Lower extremity Dopplers negative             -antiplatelet therapy: Aspirin 81 mg daily 3. Pain Management: Continue Hydrocodone as needed             2/7: does have some pain in rash in lower extremities. She has been using hydrocodone twice per day- says she uses it once per day at home. Says she uses it for  diffuse arthitic pains. She has not tried voltaren gel but is willing to.   2/8: diclofenac gel helped, continue  2/9: has not required Norco in 2 days! Continue diclofenac gel   2/13: mentation appears much improved. Suspect that norco was the main culprit. Pt says she feels better also. Continue to observe 4. Mood: Cymbalta 60 mg daily, Wellbutrin 150 mg daily, Ativan 0.5 mg every 8 hours as needed- d/ced d/t AMS             -antipsychotic agents: N/A 5. Neuropsych: This patient is capable of making decisions on her own behalf. 6. Skin/Wound Care: Routine skin checks 7. Fluids/Electrolytes/Nutrition: Routine in and outs.   8.  History of PAF/CAD/PFO.  30-day cardiac event monitor per cardiology services Dr.Kardie Tobb.  Continue Eliquis for now.  If EF improves and no atrial fibrillation in 3 months plan to stop Eliquis and restart Plavix.             Monitor with increased  activity 9.  Transaminitis.  Follow-up gastroenterology service Dr. Marina Goodell.               LFTs elevated on 1/31, AST and ALT trending downward on 2/7, repeat CMP ordered for 2/14 show further improvement in AST and ALT  10.  Hyperlipidemia.  Crestor on hold due to elevated LFTs. 11.  Rheumatoid arthritis: Low-dose prednisone as well as weekly Orencia (was on Ansid at home flurbiprofen)            Some increased joint pain but no increased swelling noted today 2/17,  12.  Diabetes mellitus.  Hemoglobin A1c 6.1.  SSI.            CBG (last 3)  Recent Labs    05/15/20 1637 05/15/20 2126 05/16/20 0549  GLUCAP 148* 146* 101*     2/17- controlled  13.  Hypothyroidism: Synthroid 14.  Essential hypertension.. Blood pressure had remained a bit soft Toprol-XL resumed at 12.5 mg daily 04/26/2020   Vitals:   05/15/20 2029 05/16/20 0422  BP: 111/69 108/71  Pulse: (!) 107 (!) 104  Resp: 18 18  Temp: 97.9 F (36.6 C) 97.6 F (36.4 C)  SpO2: 95% 95%   Controlled 2/17 15.  Constipation: multiple stools post sorbitol  1/29 16. Sleep disturbance: melatonin 3mg  started HS  Improving 17.  GI bleed with drop in Hgb, suspect lower GI bleed given bright red blood  Hb 10.1 on 2/5, cont to monitor, 2/15 stable Hgb 9.8 18. Tachycardia:improved  continue Toprol.  19. Confusion:   Resolved      20.  Hypo Na+ no obvious meds, fluid intake is not excessive , no IVF  , repeat is stable  LOS: 20 days A FACE TO FACE EVALUATION WAS PERFORMED  3/15 05/16/2020, 8:32 AM

## 2020-05-16 NOTE — Progress Notes (Signed)
Patient and spouse present for discharge education given by Physician Assistant.  Patient discharged off unit with personal belongings.  Patient and family have no further questions at time of discharge.  Patient and spouse received medication from pharmacy.  No complications noted.

## 2020-05-16 NOTE — Progress Notes (Signed)
Inpatient Rehabilitation Care Coordinator Discharge Note  The overall goal for the admission was met for:   Discharge location: Yes, home  Length of Stay: Yes, 20 days  Discharge activity level: Yes, wheelchair level Min Assist  Home/community participation: Yes  Services provided included: MD, RD, PT, OT, SLP, RN, CM, TR, Pharmacy, [place]: Private Insurance: [employer]  Choices offered to/list presented [id number]  Follow-up services arranged: Home Health: Home health of [facility]  Comments (or additional information): PT OT [facility] NO DME  Patient/Family verbalized understanding of follow-up arrangements: Yes  Individual responsible for coordination of the follow-up plan: Scottie, [phone]  Confirmed correct DME delivered: Dyanne Iha 05/16/2020    Dyanne Iha

## 2020-05-16 NOTE — Progress Notes (Signed)
Physical Therapy Session Note  Patient Details  Name: Madison Hurst MRN: [id number] Date of Birth: 02/10/1961  Today's Date: 05/16/2020 PT Individual Time: 9470-9628 PT Individual Time Calculation (min): 69 min   Short Term Goals: Week 3:  PT Short Term Goal 1 (Week 3): = to LTGs based on ELOS  Skilled Therapeutic Interventions/Progress Updates:   Patient received supine in bed, agreeable to PT. She reports 8/10 pain in B feet/ankles related to RA, premedicated. PT providing rest breaks, distractions and repositioning to assist in pain management. Patient reports that husband will be present for family ed this AM, however, this PT was unaware of that. Husband not present at beginning of session. She was able to don pants with MinA at bedlevel. She was able to come sit edge of bed with CGA and come to stand with CGA, HHA and no posterior lean evident. She transferred to wc via stand pivot with HHA. PT propelling patient in wc to therapy gym for time management and energy conservation. She was able to come to stand with RW and MinA. She reported too much pain to continue with standing or progress to gait. Patient with no LOB and no posterior lean noted, light CGA provided for general stability. Patient reports similar "pain flare ups" at home roughly every 2 weeks. She does report that these "flare ups" have caused her to fall in the past. PT emphasizing to patient importance of having hands-on assistance for all functional mobility to prevent future falls. Patient verbalizing understanding, but voiced frustration that she is not d/cing home "100% independent." Patient often seen moving in pain with significant pain response, requesting heat applied to B met heads to assist with pain management. PT did so. Upon returning back to room, husband was present. PT re-emphasizing patients need for physical assist for all functional mobility including bed mobility (intermittently), transfers,  gait. PT also reviewing process for receiving power wc and use of manual wc in the meantime. Husband was under the impression that patient would be abl to keep both hemi-height manual wc and custom power wc- PT clarified. Also reviewed that patient and husband should treat powerwc like a car- not to drive if too tired, have taken medicine that makes her tired, increased confusion, etc. Discussed that husband is able to steer powerwc while patient is in the chair. Husband and patient verbalizing understanding to the above. Patient remaining up in wc, seatbelt alarm on, call light within reach, husband at bedside. RN alerted that there were no overt functional changes in patient since fall yesterday. Okay to dc from PT perspective.     Therapy Documentation Precautions:  Precautions Precautions: Fall Precaution Comments: EF 20-25%, L hemiparesis, RA (hands, shoulders, and ankles) Restrictions Weight Bearing Restrictions: No    Therapy/Group: Individual Therapy  Karoline Caldwell, PT, DPT, CBIS  05/16/2020, 7:47 AM

## 2020-05-18 DIAGNOSIS — I2510 Atherosclerotic heart disease of native coronary artery without angina pectoris: Secondary | ICD-10-CM | POA: Diagnosis not present

## 2020-05-18 DIAGNOSIS — K922 Gastrointestinal hemorrhage, unspecified: Secondary | ICD-10-CM | POA: Diagnosis not present

## 2020-05-18 DIAGNOSIS — G479 Sleep disorder, unspecified: Secondary | ICD-10-CM | POA: Diagnosis not present

## 2020-05-18 DIAGNOSIS — I119 Hypertensive heart disease without heart failure: Secondary | ICD-10-CM | POA: Diagnosis not present

## 2020-05-18 DIAGNOSIS — Q211 Atrial septal defect: Secondary | ICD-10-CM | POA: Diagnosis not present

## 2020-05-18 DIAGNOSIS — F418 Other specified anxiety disorders: Secondary | ICD-10-CM | POA: Diagnosis not present

## 2020-05-18 DIAGNOSIS — E119 Type 2 diabetes mellitus without complications: Secondary | ICD-10-CM | POA: Diagnosis not present

## 2020-05-18 DIAGNOSIS — I480 Paroxysmal atrial fibrillation: Secondary | ICD-10-CM | POA: Diagnosis not present

## 2020-05-18 DIAGNOSIS — I69354 Hemiplegia and hemiparesis following cerebral infarction affecting left non-dominant side: Secondary | ICD-10-CM | POA: Diagnosis not present

## 2020-05-20 ENCOUNTER — Telehealth: Payer: Self-pay | Admitting: Registered Nurse

## 2020-05-20 NOTE — Telephone Encounter (Signed)
Transitional Care call Transitional Questions answered by Husband: Mr. Cottingham   Patient name: Madison Hurst DOB: 1960/10/30  1. Are you/is patient experiencing any problems since coming home? No a. Are there any questions regarding any aspect of care? No 2. Are there any questions regarding medications administration/dosing? No a. Are meds being taken as prescribed? Yes b. "Patient should review meds with caller to confirm" Medication List Reviewed. 3. Have there been any falls?No 4. Has Home Health been to the house and/or have they contacted you? Yes: [facility]  a. If not, have you tried to contact them? NA b. Can we help you contact them? No 5. Are bowels and bladder emptying properly? Mr. Barbe states she hasn't had a Bowel movement since discharge, he was instructed to monitor. And call with any questions or concerns. Also reports her appetite is fair.  a. Are there any unexpected incontinence issues? No b. If applicable, is patient following bowel/bladder programs? NA 6. Any fevers, problems with breathing, unexpected pain? No 7. Are there any skin problems or new areas of breakdown? No 8. Has the patient/family member arranged specialty MD follow up (ie cardiology/neurology/renal/surgical/etc.)?  This provider called [facility]: They will be calling Mr. Poznanski to schedule HFU appointment. She has an appointmnent with Dr. Servando Salina.  Also instructed to call Dr Marina Goodell to schedule HFU.  a. Can we help arrange? See Above 9. Does the patient need any other services or support that we can help arrange? No 10. Are caregivers following through as expected in assisting the patient? Yes 11. Has the patient quit smoking, drinking alcohol, or using drugs as recommended? (                        )  Appointment date/time 05/29/2020  arrival time 11:00 for 11:20 with Jones Bales ANP-C. At [address] [employer] [address]

## 2020-05-20 NOTE — Telephone Encounter (Signed)
Placed a call to Mr. Madison Hurst: No answer. Placed a call to home phone, left voice mail. Awaiting a return call.

## 2020-05-21 ENCOUNTER — Inpatient Hospital Stay (HOSPITAL_COMMUNITY)
Admission: EM | Admit: 2020-05-21 | Discharge: 2020-05-28 | DRG: 640 | Disposition: E | Payer: Medicare PPO | Attending: Internal Medicine | Admitting: Internal Medicine

## 2020-05-21 ENCOUNTER — Encounter (HOSPITAL_COMMUNITY): Payer: Self-pay

## 2020-05-21 ENCOUNTER — Emergency Department (HOSPITAL_COMMUNITY): Payer: Medicare PPO

## 2020-05-21 ENCOUNTER — Telehealth: Payer: Self-pay | Admitting: *Deleted

## 2020-05-21 DIAGNOSIS — Z20822 Contact with and (suspected) exposure to covid-19: Secondary | ICD-10-CM | POA: Diagnosis present

## 2020-05-21 DIAGNOSIS — Z87891 Personal history of nicotine dependence: Secondary | ICD-10-CM

## 2020-05-21 DIAGNOSIS — M4802 Spinal stenosis, cervical region: Secondary | ICD-10-CM | POA: Diagnosis present

## 2020-05-21 DIAGNOSIS — Z9049 Acquired absence of other specified parts of digestive tract: Secondary | ICD-10-CM

## 2020-05-21 DIAGNOSIS — Z8249 Family history of ischemic heart disease and other diseases of the circulatory system: Secondary | ICD-10-CM

## 2020-05-21 DIAGNOSIS — K761 Chronic passive congestion of liver: Secondary | ICD-10-CM | POA: Diagnosis present

## 2020-05-21 DIAGNOSIS — E873 Alkalosis: Secondary | ICD-10-CM | POA: Diagnosis not present

## 2020-05-21 DIAGNOSIS — E860 Dehydration: Secondary | ICD-10-CM | POA: Diagnosis present

## 2020-05-21 DIAGNOSIS — I480 Paroxysmal atrial fibrillation: Secondary | ICD-10-CM | POA: Diagnosis present

## 2020-05-21 DIAGNOSIS — I10 Essential (primary) hypertension: Secondary | ICD-10-CM | POA: Diagnosis present

## 2020-05-21 DIAGNOSIS — I69320 Aphasia following cerebral infarction: Secondary | ICD-10-CM

## 2020-05-21 DIAGNOSIS — R339 Retention of urine, unspecified: Secondary | ICD-10-CM | POA: Diagnosis present

## 2020-05-21 DIAGNOSIS — Z7189 Other specified counseling: Secondary | ICD-10-CM | POA: Diagnosis not present

## 2020-05-21 DIAGNOSIS — R4182 Altered mental status, unspecified: Secondary | ICD-10-CM | POA: Diagnosis not present

## 2020-05-21 DIAGNOSIS — D72829 Elevated white blood cell count, unspecified: Secondary | ICD-10-CM | POA: Diagnosis present

## 2020-05-21 DIAGNOSIS — T380X5A Adverse effect of glucocorticoids and synthetic analogues, initial encounter: Secondary | ICD-10-CM | POA: Diagnosis present

## 2020-05-21 DIAGNOSIS — R338 Other retention of urine: Secondary | ICD-10-CM | POA: Diagnosis present

## 2020-05-21 DIAGNOSIS — E872 Acidosis, unspecified: Secondary | ICD-10-CM | POA: Diagnosis present

## 2020-05-21 DIAGNOSIS — E039 Hypothyroidism, unspecified: Secondary | ICD-10-CM | POA: Diagnosis present

## 2020-05-21 DIAGNOSIS — R570 Cardiogenic shock: Secondary | ICD-10-CM | POA: Diagnosis present

## 2020-05-21 DIAGNOSIS — R7401 Elevation of levels of liver transaminase levels: Secondary | ICD-10-CM | POA: Diagnosis present

## 2020-05-21 DIAGNOSIS — I69354 Hemiplegia and hemiparesis following cerebral infarction affecting left non-dominant side: Secondary | ICD-10-CM | POA: Diagnosis not present

## 2020-05-21 DIAGNOSIS — R34 Anuria and oliguria: Secondary | ICD-10-CM | POA: Diagnosis not present

## 2020-05-21 DIAGNOSIS — L8990 Pressure ulcer of unspecified site, unspecified stage: Secondary | ICD-10-CM | POA: Insufficient documentation

## 2020-05-21 DIAGNOSIS — E878 Other disorders of electrolyte and fluid balance, not elsewhere classified: Secondary | ICD-10-CM | POA: Diagnosis present

## 2020-05-21 DIAGNOSIS — I471 Supraventricular tachycardia: Secondary | ICD-10-CM | POA: Diagnosis not present

## 2020-05-21 DIAGNOSIS — R5381 Other malaise: Secondary | ICD-10-CM | POA: Diagnosis present

## 2020-05-21 DIAGNOSIS — K862 Cyst of pancreas: Secondary | ICD-10-CM | POA: Diagnosis not present

## 2020-05-21 DIAGNOSIS — R0603 Acute respiratory distress: Secondary | ICD-10-CM | POA: Diagnosis not present

## 2020-05-21 DIAGNOSIS — R778 Other specified abnormalities of plasma proteins: Secondary | ICD-10-CM | POA: Diagnosis present

## 2020-05-21 DIAGNOSIS — I5042 Chronic combined systolic (congestive) and diastolic (congestive) heart failure: Secondary | ICD-10-CM | POA: Diagnosis present

## 2020-05-21 DIAGNOSIS — L89892 Pressure ulcer of other site, stage 2: Secondary | ICD-10-CM | POA: Diagnosis present

## 2020-05-21 DIAGNOSIS — E875 Hyperkalemia: Secondary | ICD-10-CM | POA: Diagnosis not present

## 2020-05-21 DIAGNOSIS — Q211 Atrial septal defect: Secondary | ICD-10-CM

## 2020-05-21 DIAGNOSIS — R911 Solitary pulmonary nodule: Secondary | ICD-10-CM | POA: Diagnosis present

## 2020-05-21 DIAGNOSIS — G928 Other toxic encephalopathy: Secondary | ICD-10-CM | POA: Diagnosis not present

## 2020-05-21 DIAGNOSIS — I248 Other forms of acute ischemic heart disease: Secondary | ICD-10-CM | POA: Diagnosis not present

## 2020-05-21 DIAGNOSIS — E876 Hypokalemia: Secondary | ICD-10-CM | POA: Diagnosis present

## 2020-05-21 DIAGNOSIS — K219 Gastro-esophageal reflux disease without esophagitis: Secondary | ICD-10-CM | POA: Diagnosis present

## 2020-05-21 DIAGNOSIS — L89890 Pressure ulcer of other site, unstageable: Secondary | ICD-10-CM | POA: Diagnosis present

## 2020-05-21 DIAGNOSIS — Z7982 Long term (current) use of aspirin: Secondary | ICD-10-CM

## 2020-05-21 DIAGNOSIS — E440 Moderate protein-calorie malnutrition: Secondary | ICD-10-CM | POA: Diagnosis present

## 2020-05-21 DIAGNOSIS — M47812 Spondylosis without myelopathy or radiculopathy, cervical region: Secondary | ICD-10-CM | POA: Diagnosis not present

## 2020-05-21 DIAGNOSIS — I700 Atherosclerosis of aorta: Secondary | ICD-10-CM | POA: Diagnosis present

## 2020-05-21 DIAGNOSIS — Z833 Family history of diabetes mellitus: Secondary | ICD-10-CM

## 2020-05-21 DIAGNOSIS — R7989 Other specified abnormal findings of blood chemistry: Secondary | ICD-10-CM | POA: Diagnosis present

## 2020-05-21 DIAGNOSIS — Z515 Encounter for palliative care: Secondary | ICD-10-CM

## 2020-05-21 DIAGNOSIS — K5900 Constipation, unspecified: Secondary | ICD-10-CM | POA: Diagnosis present

## 2020-05-21 DIAGNOSIS — G459 Transient cerebral ischemic attack, unspecified: Secondary | ICD-10-CM | POA: Diagnosis not present

## 2020-05-21 DIAGNOSIS — M069 Rheumatoid arthritis, unspecified: Secondary | ICD-10-CM | POA: Diagnosis not present

## 2020-05-21 DIAGNOSIS — R000 Tachycardia, unspecified: Secondary | ICD-10-CM | POA: Diagnosis not present

## 2020-05-21 DIAGNOSIS — R0602 Shortness of breath: Secondary | ICD-10-CM

## 2020-05-21 DIAGNOSIS — I4719 Other supraventricular tachycardia: Secondary | ICD-10-CM | POA: Diagnosis present

## 2020-05-21 DIAGNOSIS — R21 Rash and other nonspecific skin eruption: Secondary | ICD-10-CM | POA: Diagnosis present

## 2020-05-21 DIAGNOSIS — Z66 Do not resuscitate: Secondary | ICD-10-CM | POA: Diagnosis not present

## 2020-05-21 DIAGNOSIS — F419 Anxiety disorder, unspecified: Secondary | ICD-10-CM | POA: Diagnosis present

## 2020-05-21 DIAGNOSIS — I959 Hypotension, unspecified: Secondary | ICD-10-CM | POA: Diagnosis not present

## 2020-05-21 DIAGNOSIS — I110 Hypertensive heart disease with heart failure: Secondary | ICD-10-CM | POA: Diagnosis present

## 2020-05-21 DIAGNOSIS — I63511 Cerebral infarction due to unspecified occlusion or stenosis of right middle cerebral artery: Secondary | ICD-10-CM | POA: Diagnosis present

## 2020-05-21 DIAGNOSIS — R0600 Dyspnea, unspecified: Secondary | ICD-10-CM | POA: Diagnosis not present

## 2020-05-21 DIAGNOSIS — E785 Hyperlipidemia, unspecified: Secondary | ICD-10-CM | POA: Diagnosis present

## 2020-05-21 DIAGNOSIS — Z7952 Long term (current) use of systemic steroids: Secondary | ICD-10-CM

## 2020-05-21 DIAGNOSIS — I462 Cardiac arrest due to underlying cardiac condition: Secondary | ICD-10-CM | POA: Diagnosis not present

## 2020-05-21 DIAGNOSIS — I255 Ischemic cardiomyopathy: Secondary | ICD-10-CM | POA: Diagnosis present

## 2020-05-21 DIAGNOSIS — G479 Sleep disorder, unspecified: Secondary | ICD-10-CM | POA: Diagnosis not present

## 2020-05-21 DIAGNOSIS — R609 Edema, unspecified: Secondary | ICD-10-CM | POA: Diagnosis not present

## 2020-05-21 DIAGNOSIS — R39198 Other difficulties with micturition: Secondary | ICD-10-CM | POA: Diagnosis not present

## 2020-05-21 DIAGNOSIS — K922 Gastrointestinal hemorrhage, unspecified: Secondary | ICD-10-CM | POA: Diagnosis not present

## 2020-05-21 DIAGNOSIS — M059 Rheumatoid arthritis with rheumatoid factor, unspecified: Secondary | ICD-10-CM | POA: Diagnosis present

## 2020-05-21 DIAGNOSIS — Z7984 Long term (current) use of oral hypoglycemic drugs: Secondary | ICD-10-CM

## 2020-05-21 DIAGNOSIS — E119 Type 2 diabetes mellitus without complications: Secondary | ICD-10-CM

## 2020-05-21 DIAGNOSIS — E871 Hypo-osmolality and hyponatremia: Secondary | ICD-10-CM | POA: Diagnosis present

## 2020-05-21 DIAGNOSIS — M4316 Spondylolisthesis, lumbar region: Secondary | ICD-10-CM | POA: Diagnosis not present

## 2020-05-21 DIAGNOSIS — Z79890 Hormone replacement therapy (postmenopausal): Secondary | ICD-10-CM

## 2020-05-21 DIAGNOSIS — K869 Disease of pancreas, unspecified: Secondary | ICD-10-CM | POA: Diagnosis present

## 2020-05-21 DIAGNOSIS — Z79899 Other long term (current) drug therapy: Secondary | ICD-10-CM

## 2020-05-21 DIAGNOSIS — N281 Cyst of kidney, acquired: Secondary | ICD-10-CM | POA: Diagnosis not present

## 2020-05-21 DIAGNOSIS — E861 Hypovolemia: Secondary | ICD-10-CM | POA: Diagnosis present

## 2020-05-21 DIAGNOSIS — I2510 Atherosclerotic heart disease of native coronary artery without angina pectoris: Secondary | ICD-10-CM | POA: Diagnosis present

## 2020-05-21 DIAGNOSIS — N200 Calculus of kidney: Secondary | ICD-10-CM | POA: Diagnosis not present

## 2020-05-21 DIAGNOSIS — F32A Depression, unspecified: Secondary | ICD-10-CM | POA: Diagnosis present

## 2020-05-21 DIAGNOSIS — R4781 Slurred speech: Secondary | ICD-10-CM | POA: Diagnosis not present

## 2020-05-21 DIAGNOSIS — R1084 Generalized abdominal pain: Secondary | ICD-10-CM | POA: Diagnosis not present

## 2020-05-21 DIAGNOSIS — Z6834 Body mass index (BMI) 34.0-34.9, adult: Secondary | ICD-10-CM

## 2020-05-21 DIAGNOSIS — F418 Other specified anxiety disorders: Secondary | ICD-10-CM | POA: Diagnosis not present

## 2020-05-21 DIAGNOSIS — I119 Hypertensive heart disease without heart failure: Secondary | ICD-10-CM | POA: Diagnosis not present

## 2020-05-21 DIAGNOSIS — Z8673 Personal history of transient ischemic attack (TIA), and cerebral infarction without residual deficits: Secondary | ICD-10-CM

## 2020-05-21 DIAGNOSIS — I4581 Long QT syndrome: Secondary | ICD-10-CM | POA: Diagnosis present

## 2020-05-21 LAB — CBC WITH DIFFERENTIAL/PLATELET
Abs Immature Granulocytes: 0.13 10*3/uL — ABNORMAL HIGH (ref 0.00–0.07)
Basophils Absolute: 0 10*3/uL (ref 0.0–0.1)
Basophils Relative: 0 %
Eosinophils Absolute: 0 10*3/uL (ref 0.0–0.5)
Eosinophils Relative: 0 %
HCT: 36.2 % (ref 36.0–46.0)
Hemoglobin: 11.3 g/dL — ABNORMAL LOW (ref 12.0–15.0)
Immature Granulocytes: 1 %
Lymphocytes Relative: 7 %
Lymphs Abs: 1 10*3/uL (ref 0.7–4.0)
MCH: 33.5 pg (ref 26.0–34.0)
MCHC: 31.2 g/dL (ref 30.0–36.0)
MCV: 107.4 fL — ABNORMAL HIGH (ref 80.0–100.0)
Monocytes Absolute: 0.7 10*3/uL (ref 0.1–1.0)
Monocytes Relative: 6 %
Neutro Abs: 11.1 10*3/uL — ABNORMAL HIGH (ref 1.7–7.7)
Neutrophils Relative %: 86 %
Platelets: 352 10*3/uL (ref 150–400)
RBC: 3.37 MIL/uL — ABNORMAL LOW (ref 3.87–5.11)
RDW: 21.2 % — ABNORMAL HIGH (ref 11.5–15.5)
WBC: 12.9 10*3/uL — ABNORMAL HIGH (ref 4.0–10.5)
nRBC: 0.4 % — ABNORMAL HIGH (ref 0.0–0.2)

## 2020-05-21 LAB — URINALYSIS, ROUTINE W REFLEX MICROSCOPIC
Bilirubin Urine: NEGATIVE
Glucose, UA: NEGATIVE mg/dL
Hgb urine dipstick: NEGATIVE
Ketones, ur: NEGATIVE mg/dL
Leukocytes,Ua: NEGATIVE
Nitrite: NEGATIVE
Protein, ur: NEGATIVE mg/dL
Specific Gravity, Urine: 1.018 (ref 1.005–1.030)
pH: 5 (ref 5.0–8.0)

## 2020-05-21 LAB — SODIUM, URINE, RANDOM: Sodium, Ur: <10 mmol/L

## 2020-05-21 LAB — BASIC METABOLIC PANEL
Anion gap: 16 — ABNORMAL HIGH (ref 5–15)
BUN: 12 mg/dL (ref 6–20)
CO2: 19 mmol/L — ABNORMAL LOW (ref 22–32)
Calcium: 8 mg/dL — ABNORMAL LOW (ref 8.9–10.3)
Chloride: 85 mmol/L — ABNORMAL LOW (ref 98–111)
Creatinine, Ser: 0.7 mg/dL (ref 0.44–1.00)
GFR, Estimated: >60 mL/min (ref >60)
Glucose, Bld: 142 mg/dL — ABNORMAL HIGH (ref 70–99)
Potassium: 4.4 mmol/L (ref 3.5–5.1)
Sodium: 120 mmol/L — ABNORMAL LOW (ref 135–145)

## 2020-05-21 LAB — OSMOLALITY, URINE: Osmolality, Ur: 390 mOsm/kg (ref 300–900)

## 2020-05-21 LAB — RESP PANEL BY RT-PCR (FLU A&B, COVID) ARPGX2
Influenza A by PCR: NEGATIVE
Influenza B by PCR: NEGATIVE
SARS Coronavirus 2 by RT PCR: NEGATIVE

## 2020-05-21 IMAGING — CT CT ABD-PELV W/ CM
2 of 5 series · 16 of 46 positions shown, 18 images · IV contrast (omnipaque)
Comparison: CT [DATE], cardiac CT [DATE]

CLINICAL DATA: Unable to have bowel movement or urinate

EXAM:
CT ABDOMEN AND PELVIS WITH CONTRAST
TECHNIQUE: Multidetector CT imaging of the abdomen and pelvis was performed
using the standard protocol following bolus administration of
intravenous contrast.
CONTRAST:  100mL OMNIPAQUE IOHEXOL 300 MG/ML  SOLN

[Series 3: abdomen 5.0 · axial · 0.76mm/px · z∈[+821,+1146]mm · 13 of 77 slices shown, 15 images]
[im 6/77  soft-tissue]
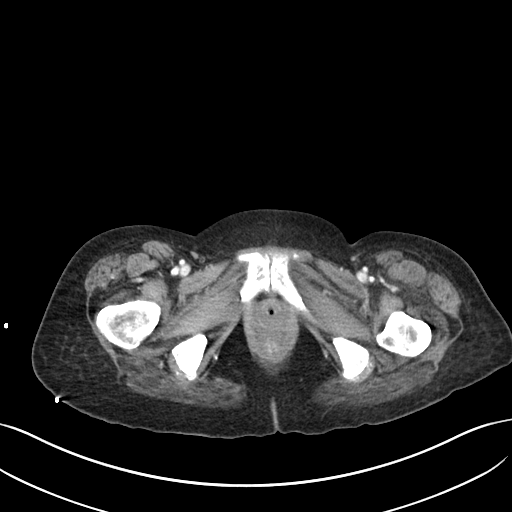
[im 6/77  bone]
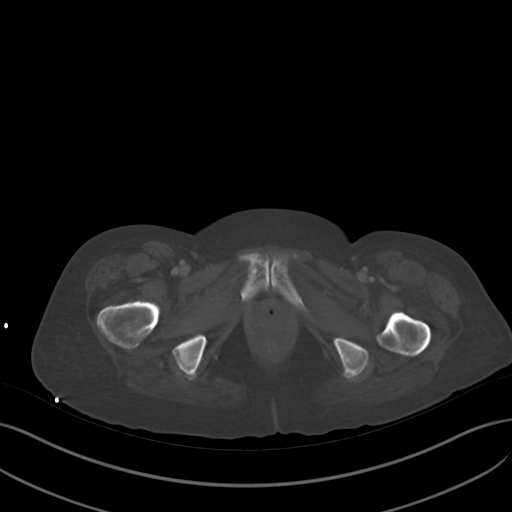
[im 11/77  soft-tissue]
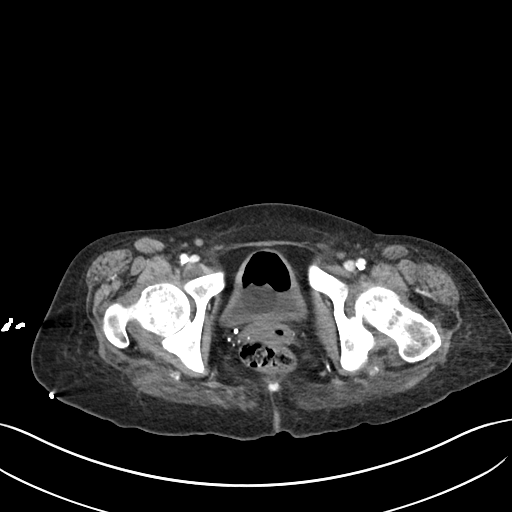
[im 16/77  soft-tissue]
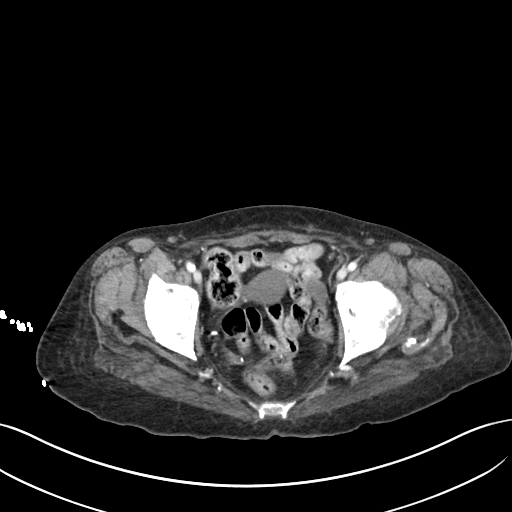
[im 21/77  soft-tissue]
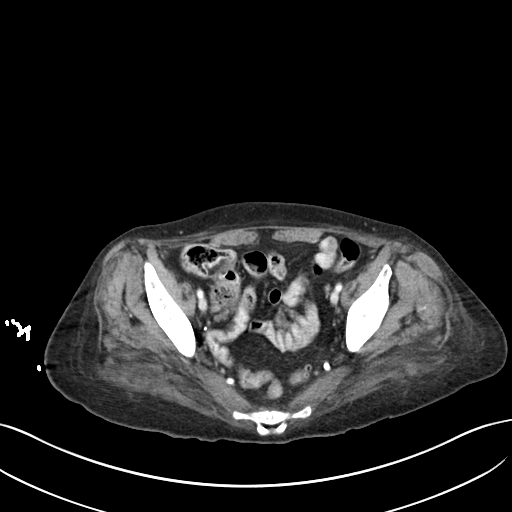
[im 26/77  soft-tissue]
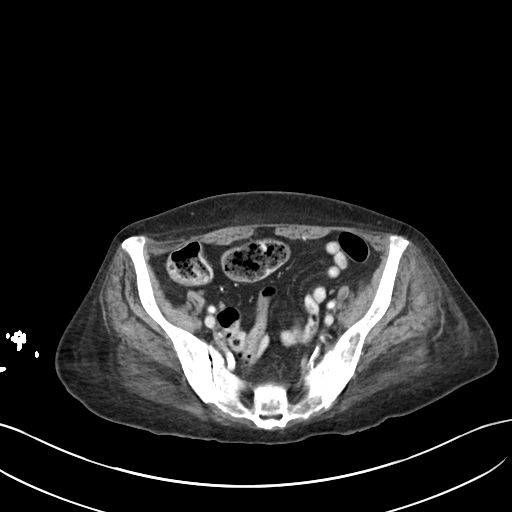
[im 31/77  soft-tissue]
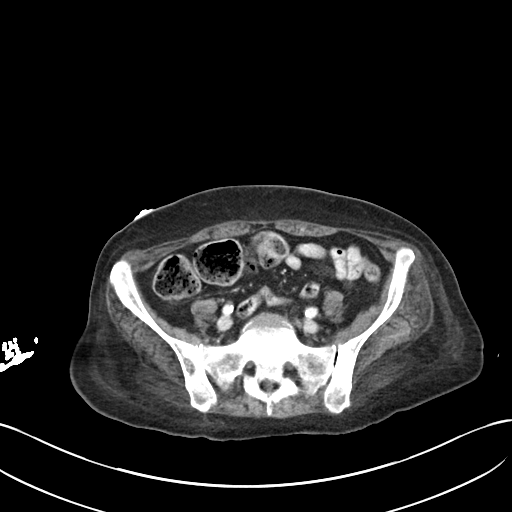
[im 41/77  soft-tissue]
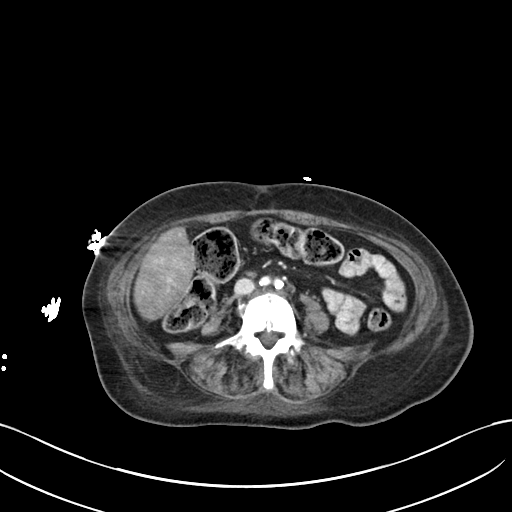
[im 46/77  soft-tissue]
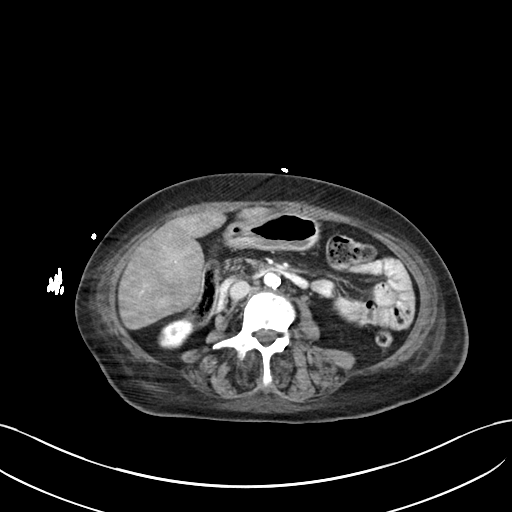
[im 51/77  soft-tissue]
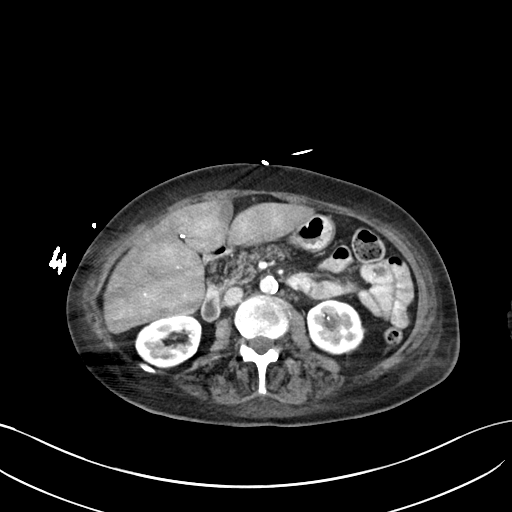
[im 51/77  bone]
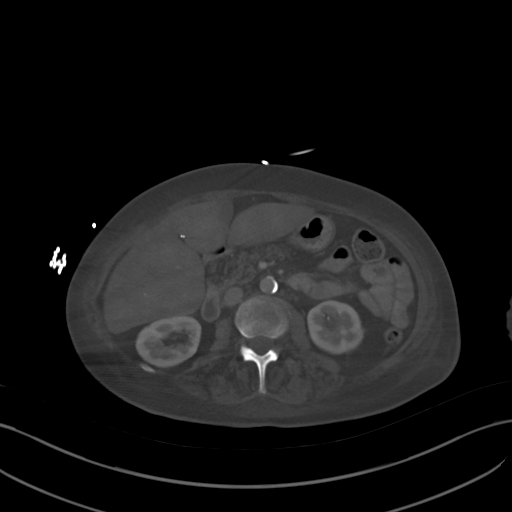
[im 56/77  soft-tissue]
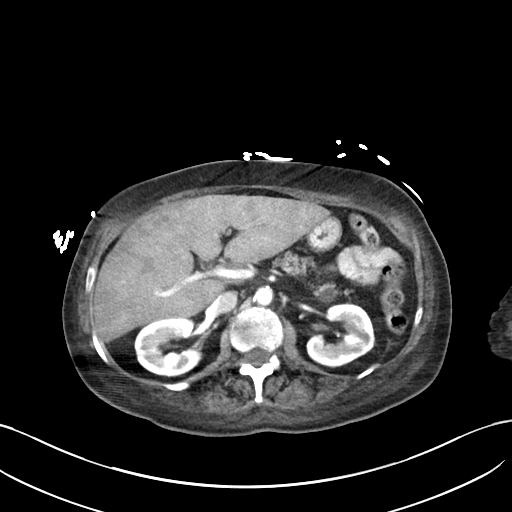
[im 61/77  soft-tissue]
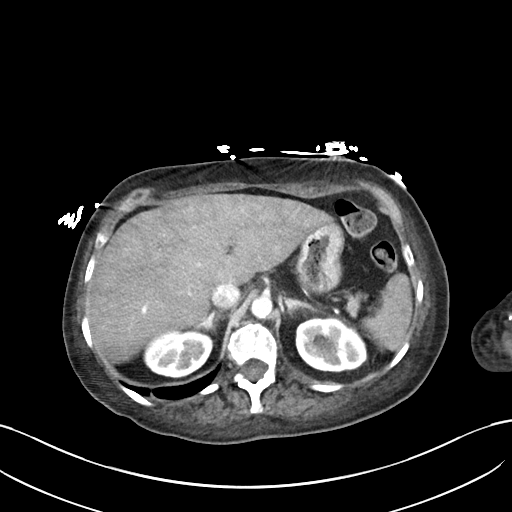
[im 66/77  soft-tissue]
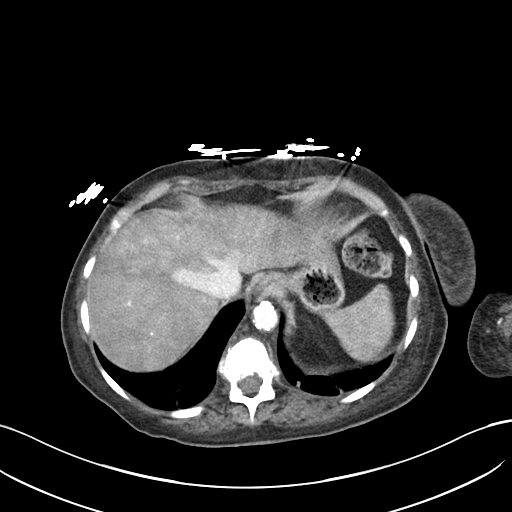
[im 71/77  soft-tissue]
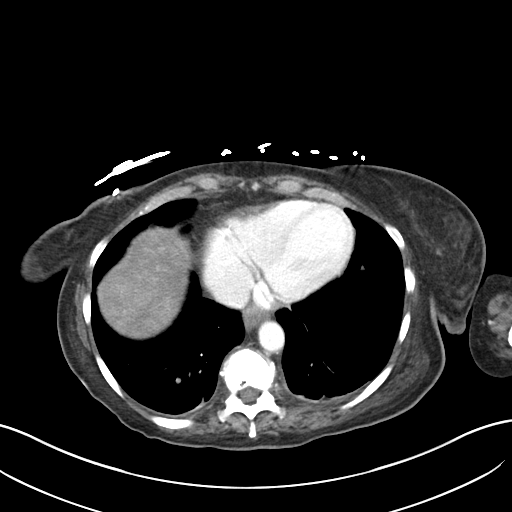

[Series 6: abdomen 3.0 mpr cor · coronal · 0.78mm/px · 3 of 78 slices shown]
[im 26/78  soft-tissue]
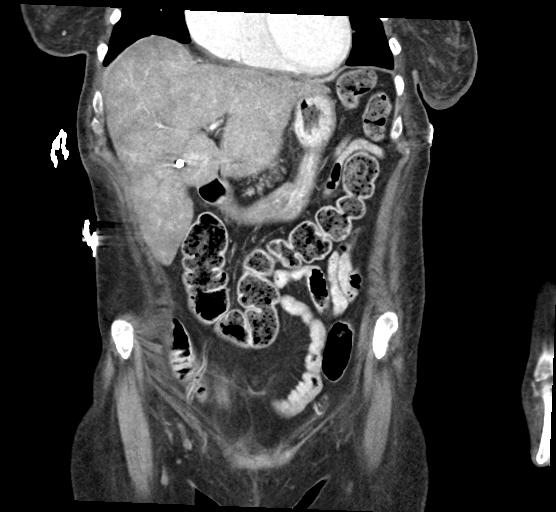
[im 35/78  soft-tissue]
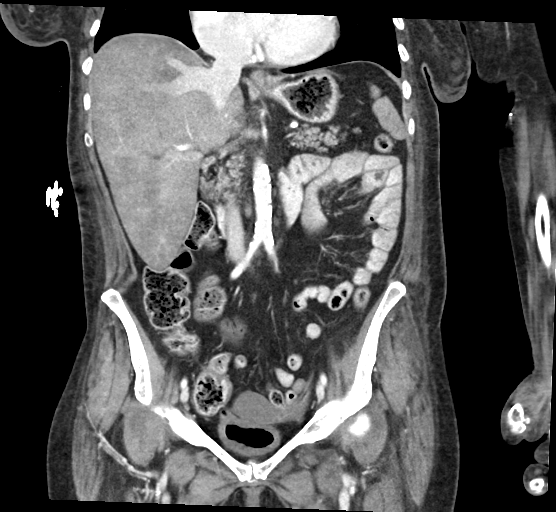
[im 43/78  soft-tissue]
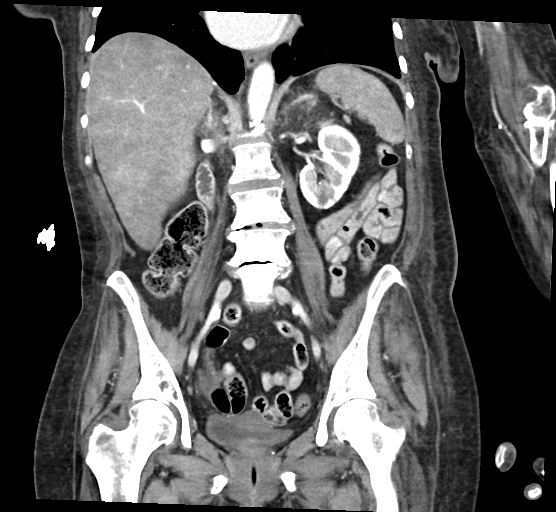

[16 of 46 positions shown; findings below may reference images not displayed]

FINDINGS: Lower chest: Lung bases demonstrate scarring or atelectasis at the
posterior bases. No acute consolidation. Irregular density within
the lingula measuring 6 mm average diameter, series 5, image number
15. Borderline to mild cardiomegaly.

Hepatobiliary: Heterogenous liver enhancement without focal lesion,
question passive congestion. Status post cholecystectomy. No biliary
dilatation.

Pancreas: No inflammatory change. 13 mm cystic lesion at the tail of
the pancreas, series 3, image number 21.

Spleen: Normal in size without focal abnormality.

Adrenals/Urinary Tract: Adrenal glands are within normal limits.
Small cysts in the kidneys. No hydronephrosis. Punctate stones
within the lower poles. Foley catheter and air in the bladder which
appears slightly thick-walled.

Stomach/Bowel: Stomach nonenlarged. No dilated small bowel. No acute
bowel wall thickening. Negative appendix. Moderate stool in the
colon.

Vascular/Lymphatic: Moderate aortic atherosclerosis. No aneurysm. No
suspicious nodes

Reproductive: Uterus and bilateral adnexa are unremarkable.

Other: Negative for free air or free fluid.

Musculoskeletal: 5 mm anterolisthesis L4 on L5 with degenerative
changes. Heterogenous mineralization as before. Advanced
degenerative changes T11 T12.
IMPRESSION: 1. No CT evidence for acute intra-abdominal or pelvic abnormality.
Negative for bowel obstruction or bowel wall thickening. Moderate
stool in the colon.
2. Punctate nonobstructing stones in the lower poles of the kidneys.
No hydronephrosis.
3. 13 mm cystic lesion at the tail of the pancreas. Recommend follow
up pre and post contrast MRI/MRCP or pancreatic protocol CT in 1
year. This recommendation follows ACR consensus guidelines:
Management of Incidental Pancreatic Cysts: A White Paper of the ACR
Incidental Findings Committee. [HOSPITAL] [TC];[DATE].
4. 6 mm irregular density/possible not in the lingula. Non-contrast
chest CT at 6-12 months is recommended. If the nodule is stable at
time of repeat CT, then future CT at 18-24 months (from today's
scan) is considered optional for low-risk patients, but is
recommended for high-risk patients. This recommendation follows the
consensus statement: Guidelines for Management of Incidental
Pulmonary Nodules Detected on CT Images: From the [HOSPITAL]
5. Heterogenous liver enhancement, question passive congestion.

Aortic Atherosclerosis ([TC]-[TC]).

## 2020-05-21 MED ORDER — DULOXETINE HCL 30 MG PO CPEP
30.0000 mg | ORAL_CAPSULE | Freq: Every day | ORAL | Status: DC
  Administered 2020-05-22: 30 mg via ORAL
  Filled 2020-05-21 (×3): qty 1

## 2020-05-21 MED ORDER — ONDANSETRON HCL 4 MG/2ML IJ SOLN: 4.0000 mg | Freq: Four times a day (QID) | INTRAMUSCULAR | Status: DC | PRN

## 2020-05-21 MED ORDER — SODIUM CHLORIDE 0.9 % IV SOLN: INTRAVENOUS | Status: DC

## 2020-05-21 MED ORDER — BUPROPION HCL ER (XL) 150 MG PO TB24: 150.0000 mg | ORAL_TABLET | Freq: Every day | ORAL | Status: DC

## 2020-05-21 MED ORDER — ACETAMINOPHEN 325 MG PO TABS
650.0000 mg | ORAL_TABLET | Freq: Four times a day (QID) | ORAL | Status: DC | PRN
  Administered 2020-05-22: 650 mg via ORAL
  Filled 2020-05-21: qty 2

## 2020-05-21 MED ORDER — IOHEXOL 300 MG/ML  SOLN
100.0000 mL | Freq: Once | INTRAMUSCULAR | Status: AC | PRN
  Administered 2020-05-21: 100 mL via INTRAVENOUS

## 2020-05-21 MED ORDER — ENOXAPARIN SODIUM 30 MG/0.3ML ~~LOC~~ SOLN
30.0000 mg | SUBCUTANEOUS | Status: DC
  Administered 2020-05-21 – 2020-05-23 (×3): 30 mg via SUBCUTANEOUS
  Filled 2020-05-21 (×3): qty 0.3

## 2020-05-21 MED ORDER — ASPIRIN EC 81 MG PO TBEC
81.0000 mg | DELAYED_RELEASE_TABLET | Freq: Every day | ORAL | Status: DC
  Administered 2020-05-22: 81 mg via ORAL
  Filled 2020-05-21: qty 1

## 2020-05-21 MED ORDER — DICLOFENAC SODIUM 1 % EX GEL
2.0000 g | Freq: Four times a day (QID) | CUTANEOUS | Status: DC
  Administered 2020-05-22 – 2020-05-23 (×7): 2 g via TOPICAL
  Filled 2020-05-21: qty 100

## 2020-05-21 MED ORDER — INSULIN ASPART 100 UNIT/ML ~~LOC~~ SOLN
0.0000 [IU] | Freq: Three times a day (TID) | SUBCUTANEOUS | Status: DC
  Administered 2020-05-22: 2 [IU] via SUBCUTANEOUS
  Administered 2020-05-23: 1 [IU] via SUBCUTANEOUS

## 2020-05-21 MED ORDER — MELATONIN 5 MG PO TABS
5.0000 mg | ORAL_TABLET | Freq: Every day | ORAL | Status: DC
  Administered 2020-05-21: 5 mg via ORAL
  Filled 2020-05-21: qty 1

## 2020-05-21 MED ORDER — LEVOTHYROXINE SODIUM 75 MCG PO TABS
75.0000 ug | ORAL_TABLET | Freq: Every day | ORAL | Status: DC
  Administered 2020-05-22: 75 ug via ORAL
  Filled 2020-05-21 (×2): qty 1

## 2020-05-21 MED ORDER — ADULT MULTIVITAMIN W/MINERALS CH
1.0000 | ORAL_TABLET | Freq: Every day | ORAL | Status: DC
  Administered 2020-05-22: 1 via ORAL
  Filled 2020-05-21: qty 1

## 2020-05-21 MED ORDER — PANTOPRAZOLE SODIUM 40 MG PO TBEC
40.0000 mg | DELAYED_RELEASE_TABLET | Freq: Every day | ORAL | Status: DC
  Administered 2020-05-22: 40 mg via ORAL
  Filled 2020-05-21: qty 1

## 2020-05-21 MED ORDER — ONDANSETRON HCL 4 MG PO TABS: 4.0000 mg | ORAL_TABLET | Freq: Four times a day (QID) | ORAL | Status: DC | PRN

## 2020-05-21 MED ORDER — FOLIC ACID 1 MG PO TABS
2.0000 mg | ORAL_TABLET | Freq: Every day | ORAL | Status: DC
  Administered 2020-05-22: 2 mg via ORAL
  Filled 2020-05-21: qty 2

## 2020-05-21 NOTE — ED Notes (Signed)
Patient difficult stick. This RN and phlebotomy unable to get blood work. IV team consult placed.

## 2020-05-21 NOTE — ED Notes (Signed)
Per IV team. No blood draws allowed from IV placed. This is patients last viable vein/IV. If another line is needed, provider to consider placing a midline or picc line

## 2020-05-21 NOTE — ED Provider Notes (Signed)
[facility] EMERGENCY DEPARTMENT Provider Note   CSN: [id number] Arrival date & time: June 06, 2020  1631     History Chief Complaint  Patient presents with  . constipation   . Urinary Retention    Madison Hurst is a 60 y.o. female. He was status post right middle cerebral artery stroke. She was discharged from the hospital on 05/16/2020 with some speech and right sided weakness. She is currently residing in a skilled nursing facility. She presents with complaint of urinary retention and constipation. She states that she has not had a bowel movement since she was discharged on the 17th. Today she felt urgency to urinate but was unable to make that happen. She is not on any kind of bowel regimen and is not on any kind of opiate at this time. She denies any severe abdominal pain but does have some distention. She denies nausea, vomiting. Denies dysuria, frequency flank pain or fever.  HPI     Past Medical History:  Diagnosis Date  . Anemia 02/09/2020  . Anxiety 10/28/2019  . Depressed left ventricular ejection fraction 03/13/2020  . Depressive disorder 10/28/2019  . DOE (dyspnea on exertion) 03/13/2020  . Hypothyroidism 02/09/2020  . Iron deficiency anemia 01/24/2020  . Metabolic syndrome 03/13/2020  . Multinodular goiter 03/07/2015  . PAC (premature atrial contraction) 03/13/2020  . PAT (paroxysmal atrial tachycardia) (HCC) 03/13/2020  . Pericardial effusion 02/09/2020  . 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 Type 2 diabetes mellitus (HCC) 05/08/2019  . Vitamin D deficiency 02/09/2020    Patient Active Problem List   Diagnosis Date Noted  . Hypoglycemia   . Diabetes mellitus type 2 in nonobese (HCC)   . Acute GI bleeding   . Sleep disturbance   . Labile blood glucose   . Benign essential HTN   . Patent foramen ovale 04/26/2020  . Right middle cerebral  artery stroke (HCC) 04/26/2020  . Acute on chronic combined systolic and diastolic CHF (congestive heart failure) (HCC)   . Transaminitis   . Rheumatoid arthritis involving multiple sites (HCC)   . Elevated liver function tests   . CVA (cerebral vascular accident) (HCC) 04/22/2020  . Essential hypertension 04/22/2020  . DOE (dyspnea on exertion) 03/13/2020  . Metabolic syndrome 03/13/2020  . Depressed left ventricular ejection fraction 03/13/2020  . PAC (premature atrial contraction) 03/13/2020  . PAT (paroxysmal atrial tachycardia) (HCC) 03/13/2020  . Anemia 02/09/2020  . Hypothyroidism 02/09/2020  . Pericardial effusion 02/09/2020  . 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 Vitamin D deficiency 02/09/2020  . Iron deficiency anemia, unspecified   . Iron deficiency anemia 01/24/2020  . Anxiety 10/28/2019  . Depression with anxiety 10/28/2019  . Type 2 diabetes mellitus (HCC) 05/08/2019  . 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 Multinodular goiter 03/07/2015    Past Surgical History:  Procedure Laterality Date  . BUBBLE STUDY  04/24/2020   Procedure: BUBBLE STUDY;  Surgeon: Meriam Sprague, MD;  Location: [facility] ENDOSCOPY;  Service: Cardiovascular;;  . CESAREAN SECTION    . CHOLECYSTECTOMY    . other     Growth removal on chest  . TEE WITHOUT CARDIOVERSION N/A 04/24/2020   Procedure: TRANSESOPHAGEAL ECHOCARDIOGRAM (TEE);  Surgeon: Meriam Sprague, MD;  Location: [facility] ENDOSCOPY;  Service: Cardiovascular;  Laterality: N/A;  . TOTAL SHOULDER ARTHROPLASTY Left      OB History   No obstetric history  on file.     Family History  Problem Relation Age of Onset  . Muscular dystrophy Mother   . Diabetes Father   . Diabetes Brother   . Heart disease Brother     Social History   Tobacco Use  . Smoking status: Former Smoker    Quit date: 02/11/1994    Years since quitting: 26.2  . Smokeless tobacco: Never Used   Vaping Use  . Vaping Use: Never used  Substance Use Topics  . Alcohol use: Never    Home Medications Prior to Admission medications   Medication Sig Start Date End Date Taking? Authorizing Provider  acetaminophen (TYLENOL) 325 MG tablet Take 2 tablets (650 mg total) by mouth every 6 (six) hours as needed for mild pain or moderate pain. 05/15/20   Angiulli, Mcarthur Rossetti, PA-C  aspirin EC 81 MG tablet Take 1 tablet (81 mg total) by mouth daily. Swallow whole. 04/12/20   Tobb, Kardie, DO  buPROPion (WELLBUTRIN XL) 150 MG 24 hr tablet Take 1 tablet (150 mg total) by mouth daily. 05/15/20   Angiulli, Mcarthur Rossetti, PA-C  diclofenac Sodium (VOLTAREN) 1 % GEL Apply 2 g topically 4 (four) times daily. 05/15/20   Angiulli, Mcarthur Rossetti, PA-C  DULoxetine (CYMBALTA) 30 MG capsule Take 1 capsule (30 mg total) by mouth daily. 05/16/20   Angiulli, Mcarthur Rossetti, PA-C  ergocalciferol (VITAMIN D2) 1.25 MG (50000 UT) capsule Take 1 capsule (50,000 Units total) by mouth once a week. 05/15/20   Angiulli, Mcarthur Rossetti, PA-C  folic acid (FOLVITE) 1 MG tablet Take 2 tablets (2 mg total) by mouth daily. 05/15/20   Angiulli, Mcarthur Rossetti, PA-C  levothyroxine (SYNTHROID) 75 MCG tablet Take 1 tablet (75 mcg total) by mouth daily before breakfast. 05/15/20   Angiulli, Mcarthur Rossetti, PA-C  melatonin 5 MG TABS Take 1 tablet (5 mg total) by mouth at bedtime. 05/15/20   Angiulli, Mcarthur Rossetti, PA-C  metFORMIN (GLUCOPHAGE) 500 MG tablet Take 1 tablet (500 mg total) by mouth 2 (two) times daily with a meal. 05/15/20   Angiulli, Mcarthur Rossetti, PA-C  Multiple Vitamin (MULTIVITAMIN) capsule Take 1 capsule by mouth daily.    [provider]  pantoprazole (PROTONIX) 40 MG tablet Take 1 tablet (40 mg total) by mouth daily. 05/15/20   Angiulli, Mcarthur Rossetti, PA-C  predniSONE (DELTASONE) 5 MG tablet Take 1 tablet (5 mg total) by mouth daily. 05/15/20   Angiulli, Mcarthur Rossetti, PA-C    Allergies    Sulfa antibiotics and Sulfamethoxazole-trimethoprim  Review of Systems   Review  of Systems Ten systems reviewed and are negative for acute change, except as noted in the HPI.   Physical Exam Updated Vital Signs BP 95/63   Pulse (!) 114   Temp (!) 97.3 F (36.3 C) (Oral)   Resp 19   SpO2 99%   Physical Exam Vitals and nursing note reviewed.  Constitutional:      General: She is not in acute distress.    Appearance: She is well-developed and well-nourished. She is not diaphoretic.  HENT:     Head: Normocephalic and atraumatic.  Eyes:     General: No scleral icterus.    Conjunctiva/sclera: Conjunctivae normal.  Cardiovascular:     Rate and Rhythm: Normal rate and regular rhythm.     Heart sounds: Normal heart sounds. No murmur heard. No friction rub. No gallop.   Pulmonary:     Effort: Pulmonary effort is normal. No respiratory distress.     Breath sounds:  Normal breath sounds.  Abdominal:     General: Bowel sounds are normal. There is no distension.     Palpations: Abdomen is soft. There is no mass.     Tenderness: There is no abdominal tenderness. There is no guarding.  Genitourinary:    Comments: Vulvar irritation. Rectal tone is normal. No palpable fecal impaction in the rectal vault. Musculoskeletal:     Cervical back: Normal range of motion.  Skin:    General: Skin is warm and dry.  Neurological:     Mental Status: She is alert and oriented to person, place, and time.     Comments: Speech is dysarthric. Weakness on the right side of her body  Psychiatric:        Behavior: Behavior normal.     ED Results / Procedures / Treatments   Labs (all labs ordered are listed, but only abnormal results are displayed) Labs Reviewed  URINALYSIS, ROUTINE W REFLEX MICROSCOPIC - Abnormal; Notable for the following components:      Result Value   Color, Urine AMBER (*)    All other components within normal limits  BASIC METABOLIC PANEL  CBC WITH DIFFERENTIAL/PLATELET    EKG EKG Interpretation  Date/Time:  Tuesday May 21 2020 16:43:31  EST Ventricular Rate:  110 PR Interval:    QRS Duration: 134 QT Interval:  333 QTC Calculation: 451 R Axis:   -18 Text Interpretation: Sinus tachycardia Atrial premature complexes IVCD, consider atypical LBBB No significant change since last tracing Confirmed by Susy Frizzle [phone]) on 05/23/2020 5:31:57 PM   Radiology No results found.  Procedures Procedures  Medications Ordered in ED Medications - No data to display  ED Course  I have reviewed the triage vital signs and the nursing notes.  Pertinent labs & imaging results that were available during my care of the patient were reviewed by me and considered in my medical decision making (see chart for details).    MDM Rules/Calculators/A&P                          60 year old female here with urinary retention and complaint of constipation.  She had greater than 500 mL of urine on bladder scan and Foley catheter was placed with significant relief.  Rectal examination revealed no fecal impaction or any significant stool burden in the rectum.  Patient also states that she has been admittedly more weak.  Her husband who is at bedside states that she is seems to have had some intermittent confusion and has had a very poor appetite and not been eating very much.  I ordered and reviewed labs which includes a CBC which shows slightly elevated blood cell count with macrocytic anemia.  BMP shows hyponatremia which is downtrending from her visit, last BMP showed a sodium of 137 days ago.  Glucose is in slightly elevated.  Urine shows no evidence of infection.  I ordered and reviewed a CT scan of the abdomen which shows no acute abnormalities nor large stool burden.  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  I consulted with Dr. Julian Reil who will admit the patient to the hospital.  She is stable throughout her visit Final Clinical Impression(s) / ED  Diagnoses Final diagnoses:  None    Rx / DC Orders ED Discharge Orders    None       Arthor Captain, PA-C 05/20/2020 2311  Pollyann Savoy, MD 05/22/20 [phone]

## 2020-05-21 NOTE — ED Notes (Signed)
Provider made aware that per IV team when it comes to obtaining blood work or patient needing another line, the provider will need to put in an order for a different type of access due to patients limited vein supply.

## 2020-05-21 NOTE — Telephone Encounter (Signed)
Madison Hurst called to report that his wife has not urinated in 23-24 hours even though she is drinking fluids, and has not had a BM since before she left the hospital on 05/16/20. He has spoken with the primary and she directed him to call Dr Wynn Banker.  Please advise.

## 2020-05-21 NOTE — Telephone Encounter (Signed)
Per Dr Wynn Banker she will need to go back to the ED to be evaluated for urinary retention.  The lack of BM is not as much of a concern as not urinating in 24 hours, so based on that Mr Reddoch is to take her back to the [facility] ED.  He said he will call for an ambulance to transport her back to the ED.

## 2020-05-21 NOTE — ED Triage Notes (Signed)
Per ems pt had a stroke 3 weeks ago and discharged last Thursday. Since being discharged patient has been unable to have a bowl movement and hasnt been able to urinate in the last 24 hours. Pt alert and oriented but hard to understand due to the stroke. Pt has L sided deficits from the stroke. Pt coming from home.

## 2020-05-22 ENCOUNTER — Other Ambulatory Visit: Payer: Self-pay

## 2020-05-22 ENCOUNTER — Observation Stay (HOSPITAL_COMMUNITY): Payer: Medicare PPO

## 2020-05-22 ENCOUNTER — Inpatient Hospital Stay (HOSPITAL_COMMUNITY): Payer: Medicare PPO

## 2020-05-22 DIAGNOSIS — E119 Type 2 diabetes mellitus without complications: Secondary | ICD-10-CM

## 2020-05-22 DIAGNOSIS — I471 Supraventricular tachycardia: Secondary | ICD-10-CM

## 2020-05-22 DIAGNOSIS — Z515 Encounter for palliative care: Secondary | ICD-10-CM | POA: Diagnosis not present

## 2020-05-22 DIAGNOSIS — E873 Alkalosis: Secondary | ICD-10-CM | POA: Diagnosis not present

## 2020-05-22 DIAGNOSIS — R911 Solitary pulmonary nodule: Secondary | ICD-10-CM | POA: Diagnosis present

## 2020-05-22 DIAGNOSIS — Z20822 Contact with and (suspected) exposure to covid-19: Secondary | ICD-10-CM | POA: Diagnosis present

## 2020-05-22 DIAGNOSIS — I10 Essential (primary) hypertension: Secondary | ICD-10-CM | POA: Diagnosis not present

## 2020-05-22 DIAGNOSIS — E440 Moderate protein-calorie malnutrition: Secondary | ICD-10-CM | POA: Diagnosis present

## 2020-05-22 DIAGNOSIS — Q211 Atrial septal defect: Secondary | ICD-10-CM | POA: Diagnosis not present

## 2020-05-22 DIAGNOSIS — Z7189 Other specified counseling: Secondary | ICD-10-CM | POA: Diagnosis not present

## 2020-05-22 DIAGNOSIS — R338 Other retention of urine: Secondary | ICD-10-CM

## 2020-05-22 DIAGNOSIS — I248 Other forms of acute ischemic heart disease: Secondary | ICD-10-CM | POA: Diagnosis not present

## 2020-05-22 DIAGNOSIS — M069 Rheumatoid arthritis, unspecified: Secondary | ICD-10-CM

## 2020-05-22 DIAGNOSIS — I69354 Hemiplegia and hemiparesis following cerebral infarction affecting left non-dominant side: Secondary | ICD-10-CM | POA: Diagnosis not present

## 2020-05-22 DIAGNOSIS — M4802 Spinal stenosis, cervical region: Secondary | ICD-10-CM | POA: Diagnosis not present

## 2020-05-22 DIAGNOSIS — R570 Cardiogenic shock: Secondary | ICD-10-CM | POA: Diagnosis not present

## 2020-05-22 DIAGNOSIS — L89892 Pressure ulcer of other site, stage 2: Secondary | ICD-10-CM | POA: Diagnosis present

## 2020-05-22 DIAGNOSIS — E871 Hypo-osmolality and hyponatremia: Secondary | ICD-10-CM | POA: Diagnosis present

## 2020-05-22 DIAGNOSIS — R4182 Altered mental status, unspecified: Secondary | ICD-10-CM | POA: Diagnosis not present

## 2020-05-22 DIAGNOSIS — E039 Hypothyroidism, unspecified: Secondary | ICD-10-CM | POA: Diagnosis present

## 2020-05-22 DIAGNOSIS — I5042 Chronic combined systolic (congestive) and diastolic (congestive) heart failure: Secondary | ICD-10-CM | POA: Diagnosis present

## 2020-05-22 DIAGNOSIS — E875 Hyperkalemia: Secondary | ICD-10-CM | POA: Diagnosis not present

## 2020-05-22 DIAGNOSIS — K761 Chronic passive congestion of liver: Secondary | ICD-10-CM | POA: Diagnosis present

## 2020-05-22 DIAGNOSIS — R339 Retention of urine, unspecified: Secondary | ICD-10-CM | POA: Diagnosis present

## 2020-05-22 DIAGNOSIS — I63511 Cerebral infarction due to unspecified occlusion or stenosis of right middle cerebral artery: Secondary | ICD-10-CM

## 2020-05-22 DIAGNOSIS — L89890 Pressure ulcer of other site, unstageable: Secondary | ICD-10-CM | POA: Diagnosis present

## 2020-05-22 DIAGNOSIS — K869 Disease of pancreas, unspecified: Secondary | ICD-10-CM | POA: Diagnosis present

## 2020-05-22 DIAGNOSIS — Z66 Do not resuscitate: Secondary | ICD-10-CM | POA: Diagnosis not present

## 2020-05-22 DIAGNOSIS — I462 Cardiac arrest due to underlying cardiac condition: Secondary | ICD-10-CM | POA: Diagnosis not present

## 2020-05-22 DIAGNOSIS — K862 Cyst of pancreas: Secondary | ICD-10-CM | POA: Diagnosis present

## 2020-05-22 DIAGNOSIS — G928 Other toxic encephalopathy: Secondary | ICD-10-CM | POA: Diagnosis not present

## 2020-05-22 DIAGNOSIS — I110 Hypertensive heart disease with heart failure: Secondary | ICD-10-CM | POA: Diagnosis present

## 2020-05-22 LAB — BASIC METABOLIC PANEL
Anion gap: 15 (ref 5–15)
BUN: 11 mg/dL (ref 6–20)
CO2: 18 mmol/L — ABNORMAL LOW (ref 22–32)
Calcium: 7.8 mg/dL — ABNORMAL LOW (ref 8.9–10.3)
Chloride: 88 mmol/L — ABNORMAL LOW (ref 98–111)
Creatinine, Ser: 0.73 mg/dL (ref 0.44–1.00)
GFR, Estimated: >60 mL/min (ref >60)
Glucose, Bld: 114 mg/dL — ABNORMAL HIGH (ref 70–99)
Potassium: 4.9 mmol/L (ref 3.5–5.1)
Sodium: 121 mmol/L — ABNORMAL LOW (ref 135–145)

## 2020-05-22 LAB — GLUCOSE, CAPILLARY
Glucose-Capillary: 93 mg/dL (ref 70–99)
Glucose-Capillary: 99 mg/dL (ref 70–99)
Glucose-Capillary: 178 mg/dL — ABNORMAL HIGH (ref 70–99)
Glucose-Capillary: 206 mg/dL — ABNORMAL HIGH (ref 70–99)
Glucose-Capillary: 154 mg/dL — ABNORMAL HIGH (ref 70–99)

## 2020-05-22 LAB — SODIUM: Sodium: 119 mmol/L — CL (ref 135–145)

## 2020-05-22 IMAGING — MR MR CERVICAL SPINE W/O CM
4 series · 19 of 48 positions shown · non-contrast
Comparison: Cervical spine MRI [DATE].

CLINICAL DATA: 59-year-old female unable to have bowel movement or
urinate. Status post cervical spine MRI last month which was motion
degraded.

EXAM:
MRI CERVICAL SPINE WITHOUT CONTRAST
TECHNIQUE: Multiplanar, multisequence MR imaging of the cervical spine was
performed. No intravenous contrast was administered.

[Series 4: FLAIR · sagittal · 3.0mm · 0.43mm/px · 3 of 16 slices shown]
[im 3/16]
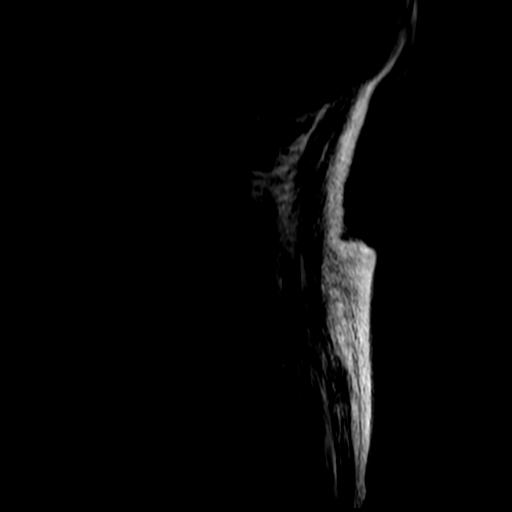
[im 9/16]
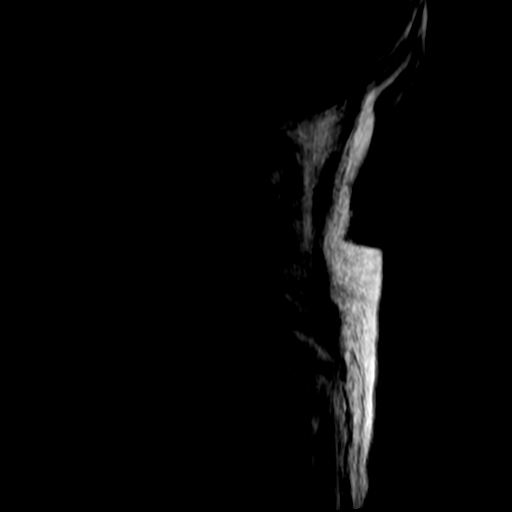
[im 14/16]
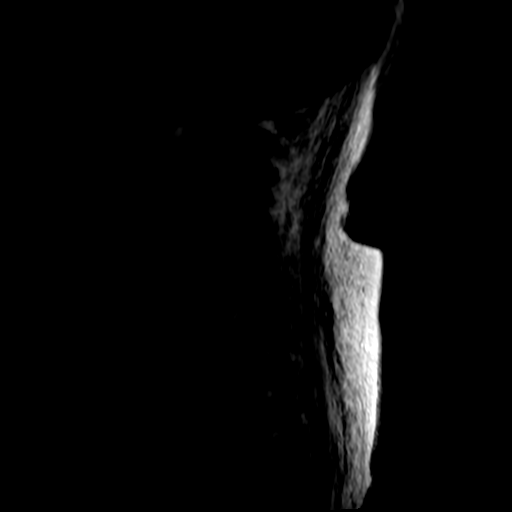

[Series 5: T2 · sagittal · 3.0mm · 0.43mm/px · 9 of 16 slices shown]
[im 1/16]
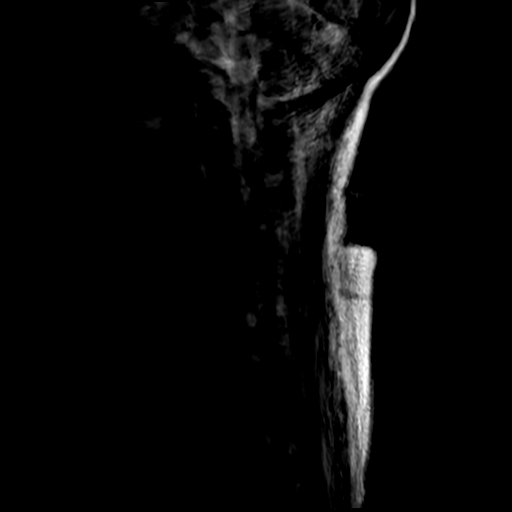
[im 3/16]
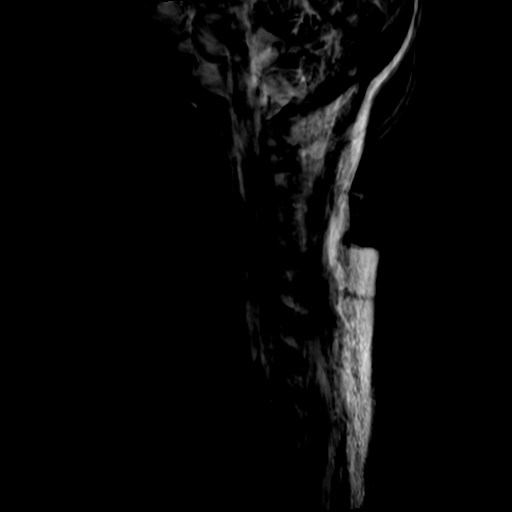
[im 5/16]
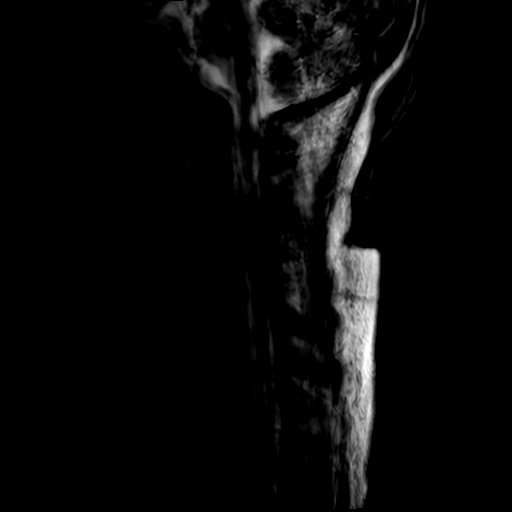
[im 7/16]
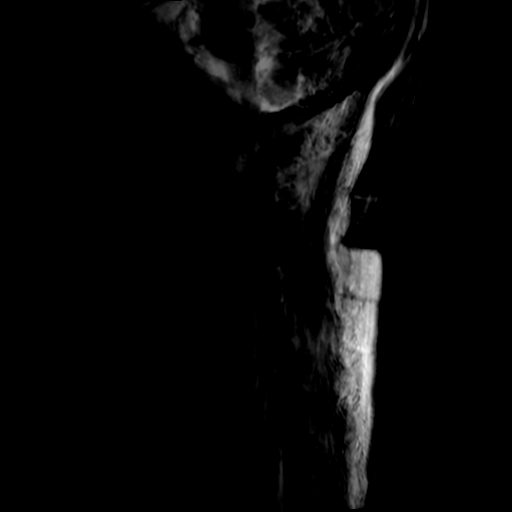
[im 9/16]
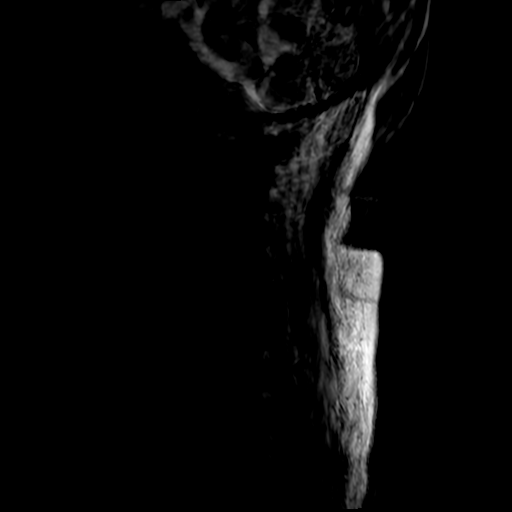
[im 11/16]
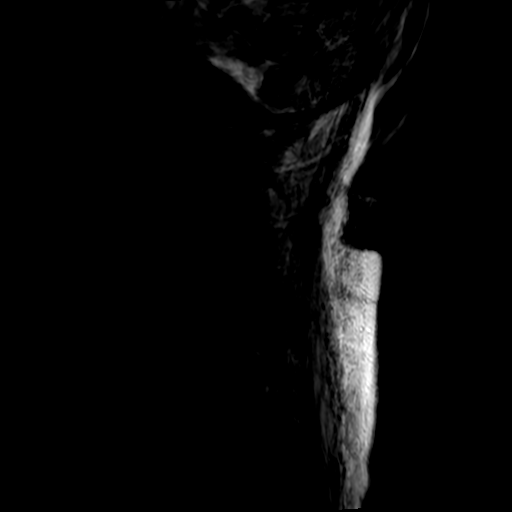
[im 13/16]
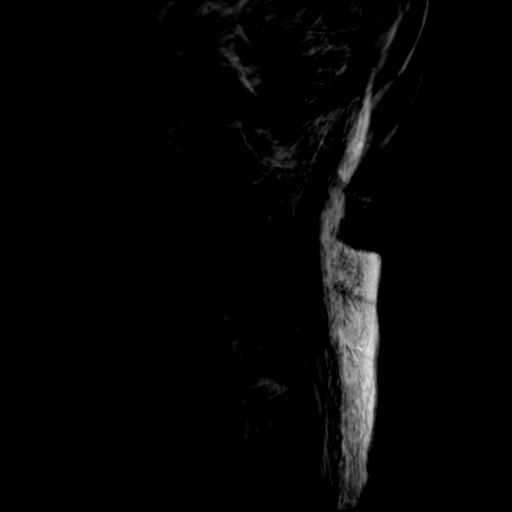
[im 14/16]
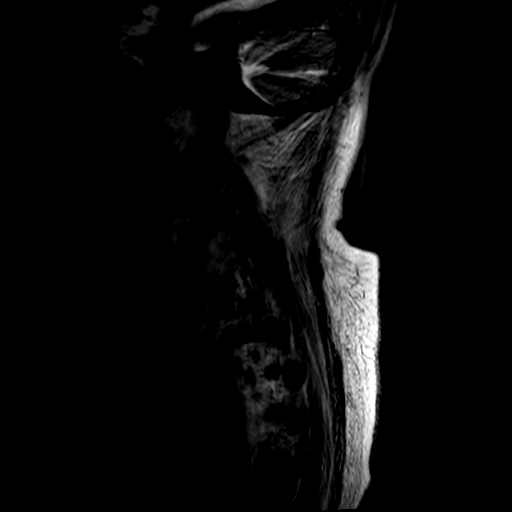
[im 16/16]
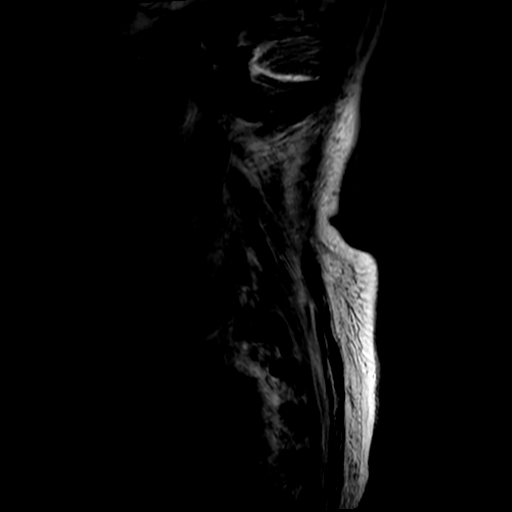

[Series 6: STIR · sagittal · 3.0mm · 0.43mm/px · 4 of 16 slices shown (1 of 2)]
[im 1/16]
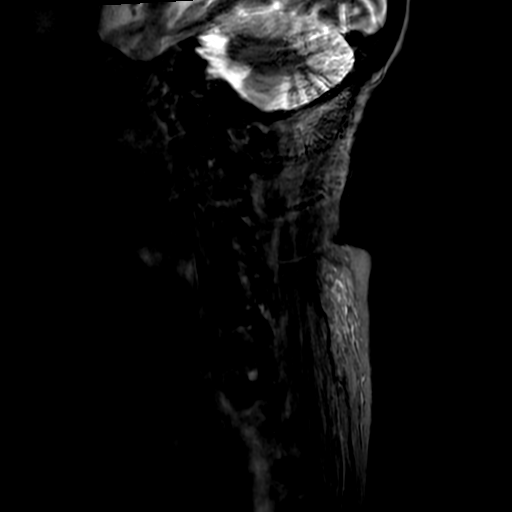
[im 3/16]
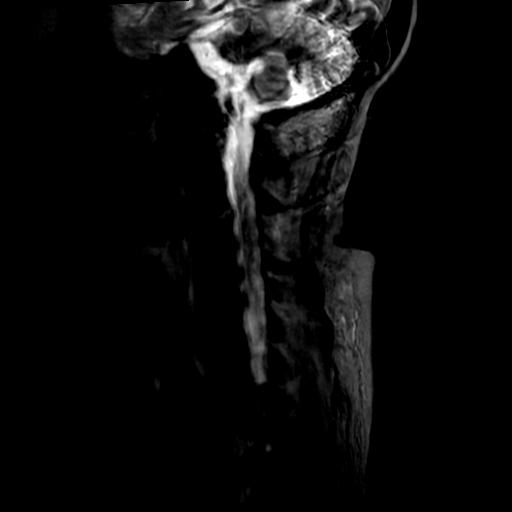
[im 9/16]
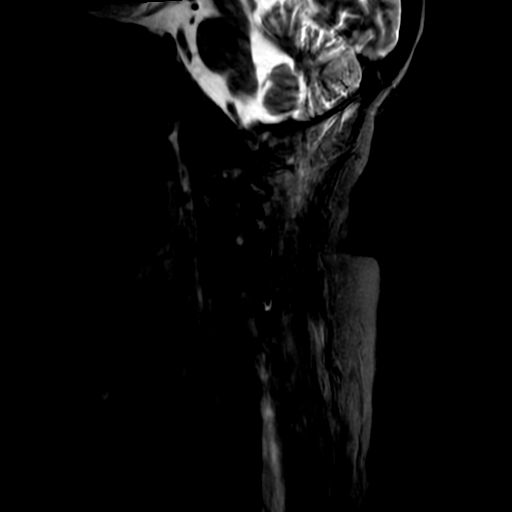
[im 14/16]
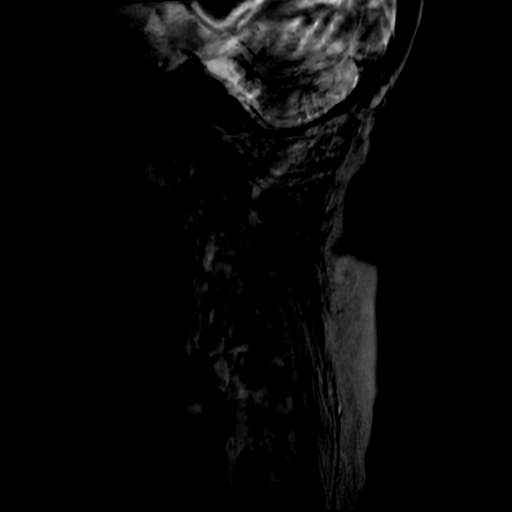

[Series 7: STIR · sagittal · 3.0mm · 0.43mm/px · 3 of 16 slices shown (2 of 2)]
[im 3/16]
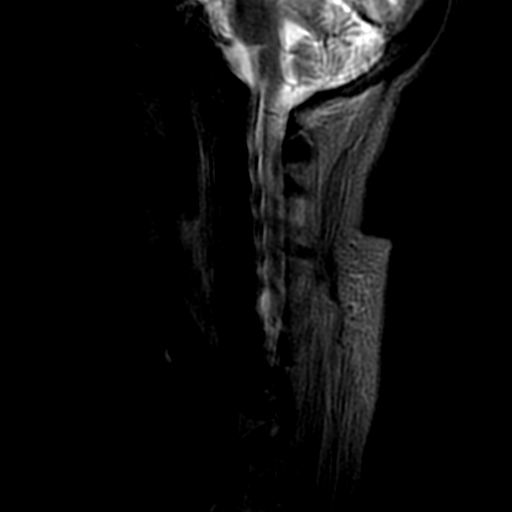
[im 9/16]
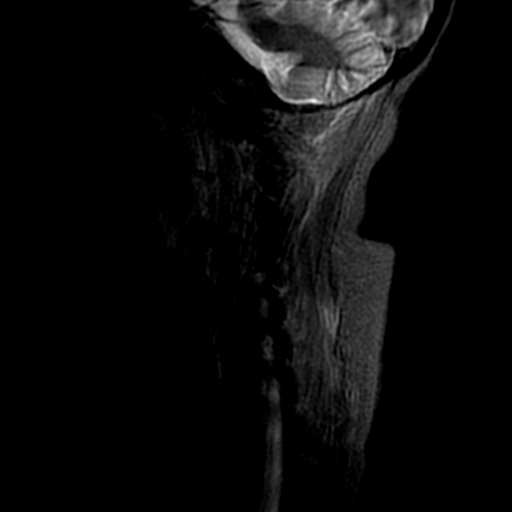
[im 14/16]
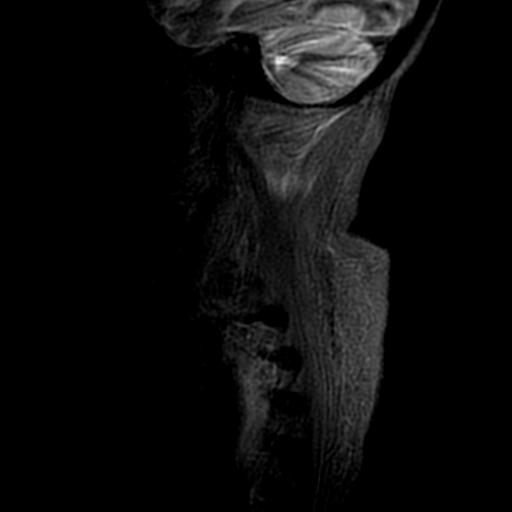

[19 of 48 positions shown; findings below may reference images not displayed]

FINDINGS: Severely motion degraded exam consisting of only sagittal images
today.

The exam is largely nondiagnostic.

Sagittal T1 and T2 images suggest stable patency of the cervical
spinal canal, without high-grade cervical spinal stenosis. Similar
patency of the posterior fossa basilar cisterns is noted.

There is no diagnostic imaging detail of the spinal cord today due
to motion.
IMPRESSION: Largely nondiagnostic exam today due to motion.

Given the degree of excessive motion, the cervical spine would
probably be better re-characterized by noncontrast CT unless this
patient can be premedicated/sedated for MRI.

## 2020-05-22 IMAGING — MR MR CERVICAL SPINE W/O CM
5 series · 38 of 48 positions shown · non-contrast
Comparison: Earlier same day.  [DATE].

CLINICAL DATA: Acute presentation with inability to urinate. Poor
quality study done earlier today secondary to motion degradation.

EXAM:
MRI CERVICAL SPINE WITHOUT CONTRAST
TECHNIQUE: Multiplanar, multisequence MR imaging of the cervical spine was
performed. No intravenous contrast was administered.

[Series 8: T2 · sagittal · 3.0mm · 0.69mm/px · 6 of 15 slices shown (1 of 2)]
[im 1/15]
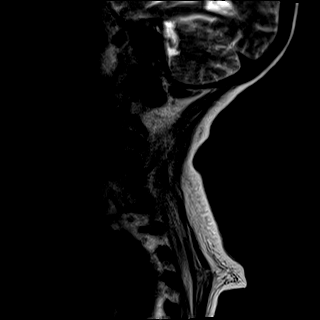
[im 3/15]
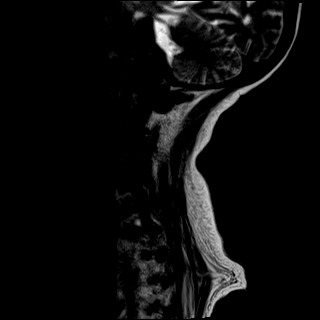
[im 6/15]
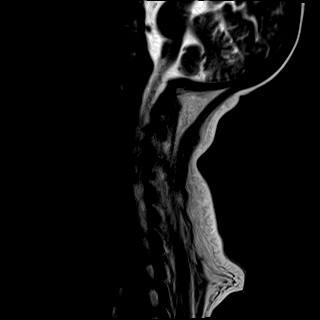
[im 9/15]
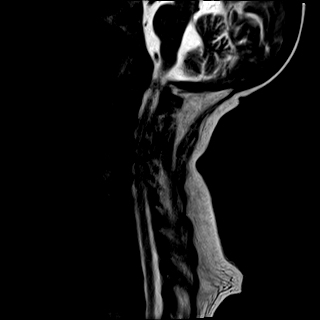
[im 12/15]
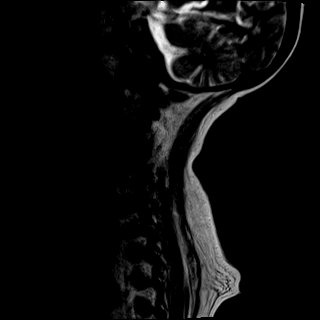
[im 15/15]
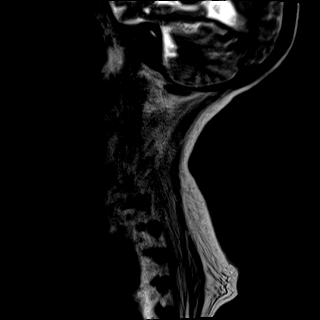

[Series 9: T1 · sagittal · 3.0mm · 0.69mm/px · 6 of 15 slices shown]
[im 1/15]
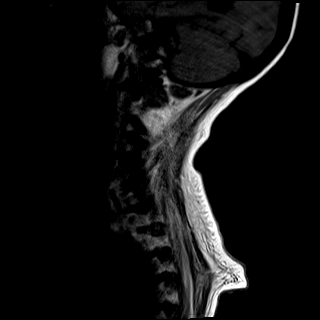
[im 3/15]
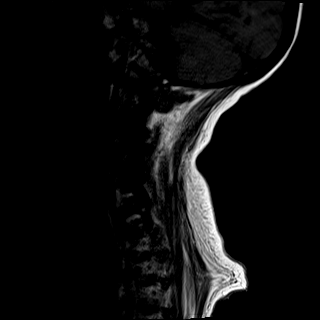
[im 6/15]
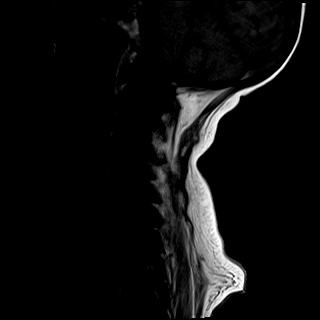
[im 9/15]
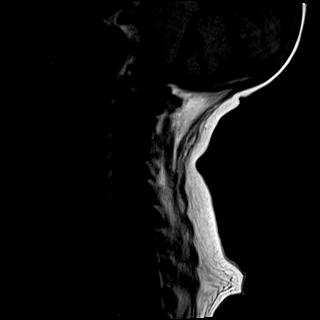
[im 12/15]
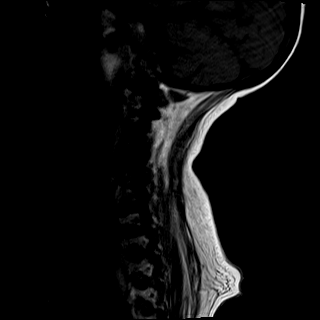
[im 15/15]
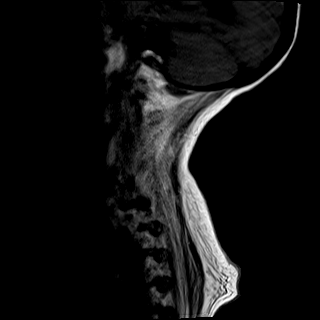

[Series 10: STIR · sagittal · 3.0mm · 0.86mm/px · 6 of 15 slices shown]
[im 1/15]
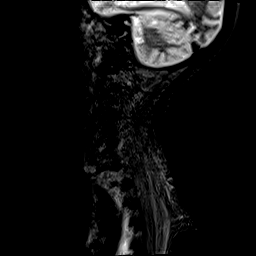
[im 3/15]
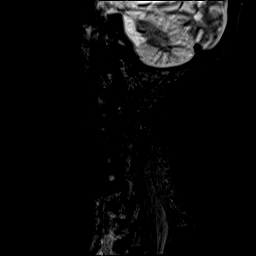
[im 6/15]
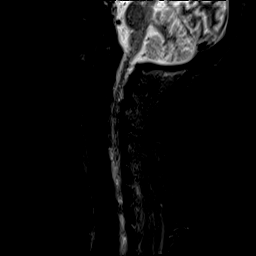
[im 9/15]
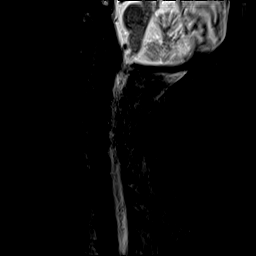
[im 12/15]
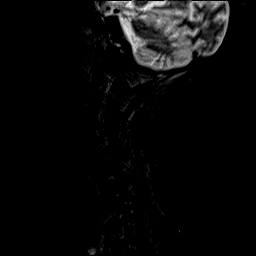
[im 15/15]
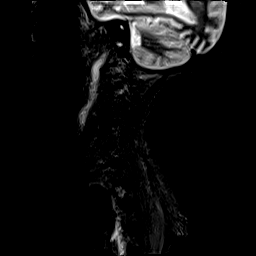

[Series 11: T2 · axial · 3.0mm · 0.66mm/px · z∈[-38,+70]mm · 12 of 38 slices shown (2 of 2)]
[im 1/38]
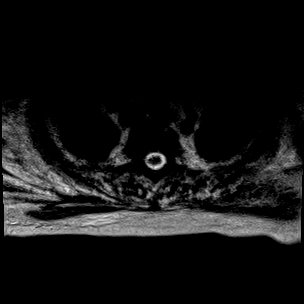
[im 3/38]
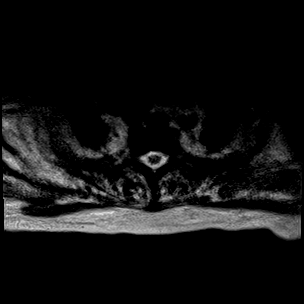
[im 6/38]
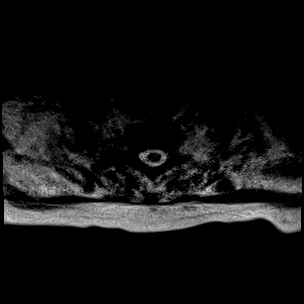
[im 8/38]
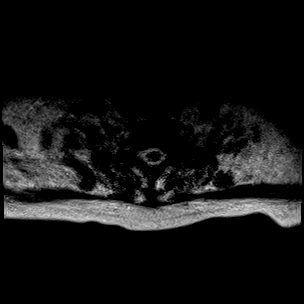
[im 11/38]
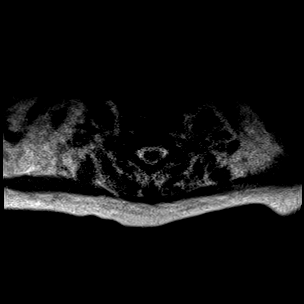
[im 14/38]
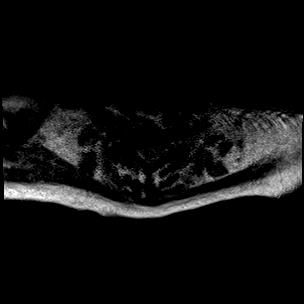
[im 16/38]
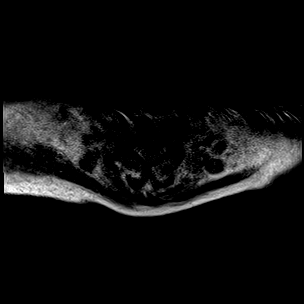
[im 19/38]
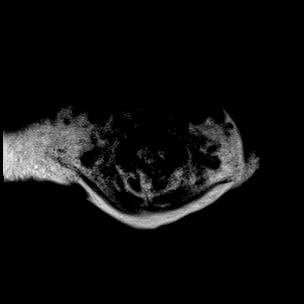
[im 22/38]
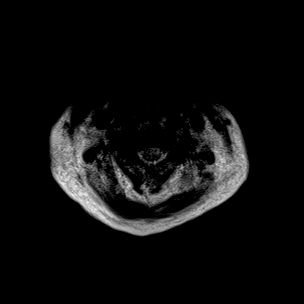
[im 27/38]
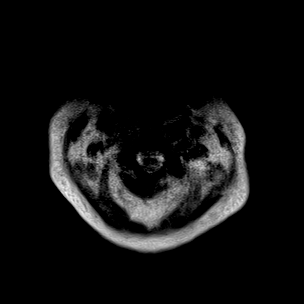
[im 32/38]
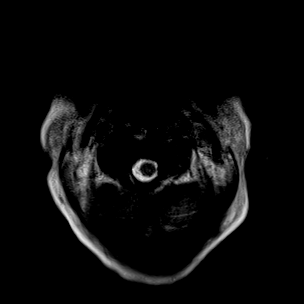
[im 38/38]
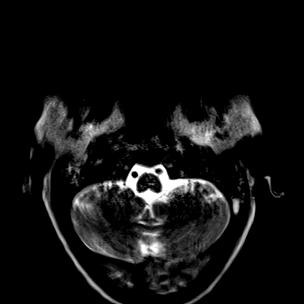

[Series 12: GRE · axial · 3.0mm · 0.39mm/px · z∈[-38,+70]mm · 8 of 38 slices shown]
[im 1/38]
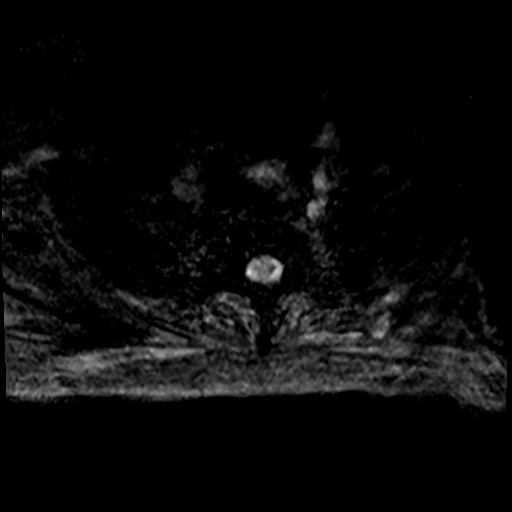
[im 6/38]
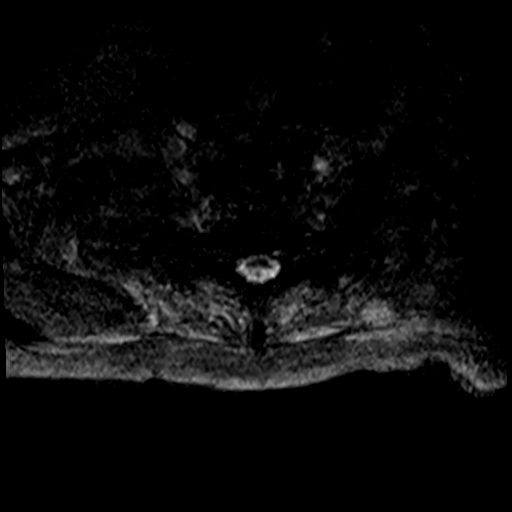
[im 11/38]
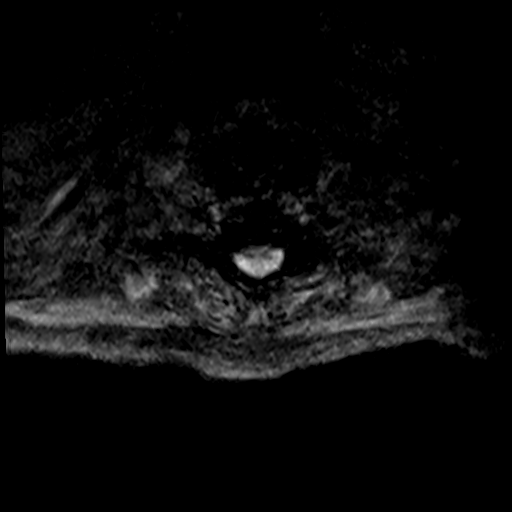
[im 16/38]
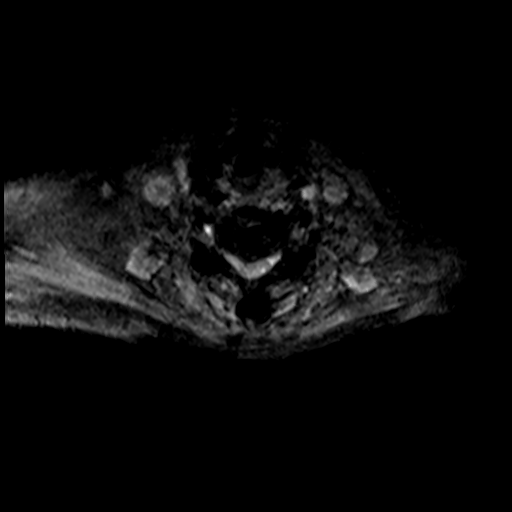
[im 22/38]
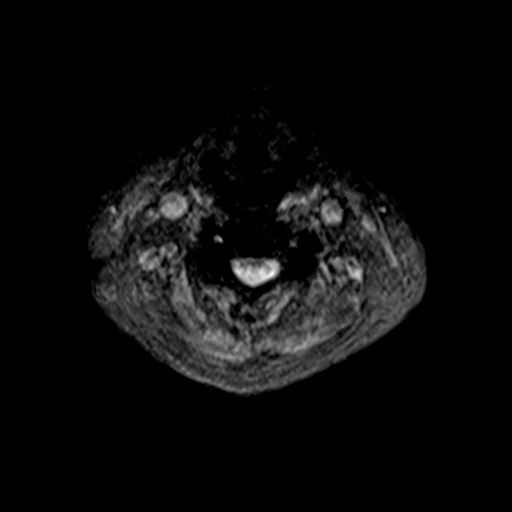
[im 27/38]
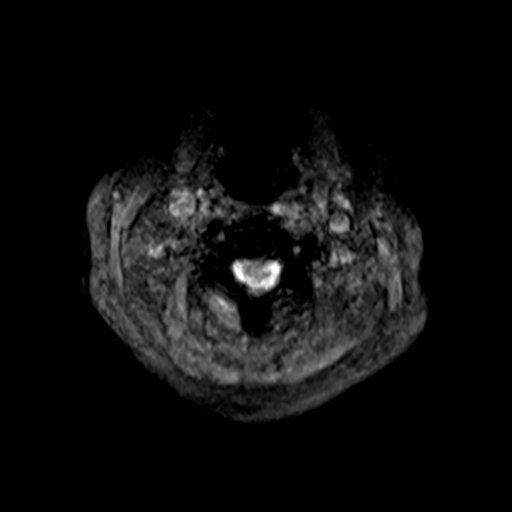
[im 32/38]
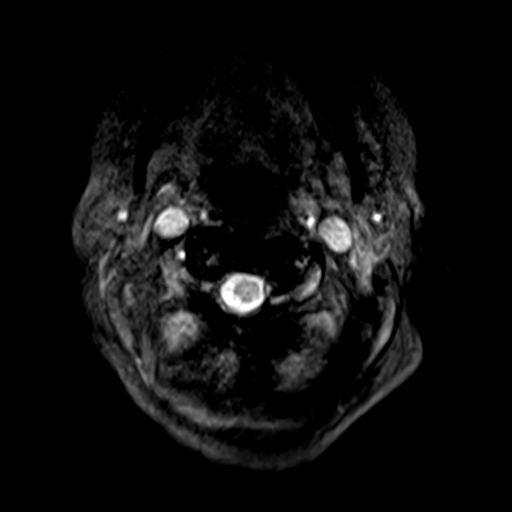
[im 38/38]
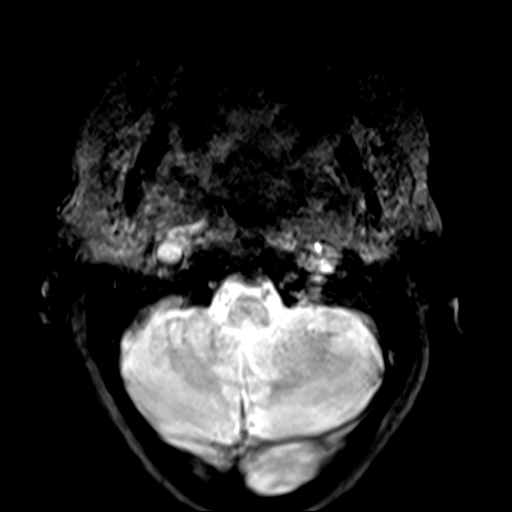

[38 of 48 positions shown; findings below may reference images not displayed]

FINDINGS: Yet again, the study is markedly degraded by motion. This is the
third attempted exam over the last month and it would seem that the
patient is not able to tolerate MRI without sedation.

Alignment: No malalignment.

Vertebrae: No evidence of fracture or primary bone lesion.

Cord: Cord detail quite limited. No visible cord compression or
primary cord lesion.

Posterior Fossa, vertebral arteries, paraspinal tissues: Negative

Disc levels:

Degenerative spondylosis from C3-4 through C6-7. Osteophytic
narrowing of the canal and foramina without detection of severe
stenosis.
IMPRESSION: 1. Abbreviated and motion degraded study. This is the third
attempted exam over the last month and it would seem that the
patient is not able to tolerate MRI without sedation.
2. Degenerative spondylosis from C3-4 through C6-7. No compressive
canal or foraminal stenosis suspected. Detail remains quite poor.

## 2020-05-22 MED ORDER — SODIUM CHLORIDE 0.9 % IV BOLUS
500.0000 mL | Freq: Once | INTRAVENOUS | Status: AC
  Administered 2020-05-22: 500 mL via INTRAVENOUS

## 2020-05-22 MED ORDER — BUPROPION HCL 100 MG PO TABS
100.0000 mg | ORAL_TABLET | Freq: Every day | ORAL | Status: DC
  Administered 2020-05-22: 100 mg via ORAL
  Filled 2020-05-22 (×2): qty 1

## 2020-05-22 MED ORDER — SODIUM CHLORIDE 0.45 % IV BOLUS
500.0000 mL | Freq: Once | INTRAVENOUS | Status: AC
  Administered 2020-05-22: 500 mL via INTRAVENOUS

## 2020-05-22 MED ORDER — PREDNISONE 5 MG PO TABS
5.0000 mg | ORAL_TABLET | Freq: Every day | ORAL | Status: DC
  Administered 2020-05-22: 5 mg via ORAL
  Filled 2020-05-22: qty 1

## 2020-05-22 MED ORDER — CHLORHEXIDINE GLUCONATE CLOTH 2 % EX PADS
6.0000 | MEDICATED_PAD | Freq: Every day | CUTANEOUS | Status: DC
  Administered 2020-05-22 – 2020-05-23 (×2): 6 via TOPICAL

## 2020-05-22 MED ORDER — LORAZEPAM 2 MG/ML IJ SOLN
2.0000 mg | Freq: Once | INTRAMUSCULAR | Status: AC
  Administered 2020-05-22: 2 mg via INTRAVENOUS
  Filled 2020-05-22: qty 1

## 2020-05-22 NOTE — ED Notes (Signed)
Pt returned from MRI °

## 2020-05-22 NOTE — Progress Notes (Signed)
Madison Hurst is a 60 y.o. 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  Pt then sent to rehab and ultimately discharged from hospital home on 2/17.  Pt presents to the ED with new onset of inability to urinate for the past 24h. Feels like she needs to urinate but unable to do so.  No severe abd pain, does have some distention.  No dysuria, fever, or flank pain.   ED Course: Sodium 120.  500cc on bladder scan, foley put in.  CT abd/pelvis neg for acute findings.  Husband reports poor PO intake recently.  05/22/20: Seen and examined at her bedside this morning.  She was somnolent but easily arousable to voices and answering to questions appropriately.  She denies having neck pain.  Foley catheter in place.  MRI result was nondiagnostic due to movements, recommendations made for noncontrast CT scan unless can be premedicated/sedated for MRI.  Please refer to H&P dictated by my partner Dr. Julian Reil on 05/22/2020 for further details of the assessment and plan.

## 2020-05-22 NOTE — Progress Notes (Signed)
Patient arrived to 6N31 from ED. Report received from [place], [place]. Patient alert and oriented x4. Generalized bruising and edema. See assessment. Pain 5 out of 10 in BLE. PRN tylenol given. See MAR. Yellow MEWS protocol initiated due to HR and BP. MD paged and made aware. Awaiting orders.  Will continue to monitor.

## 2020-05-22 NOTE — Progress Notes (Signed)
   05/22/20 0436  Assess: MEWS Score  Temp 98.2 F (36.8 C)  BP 97/81  Pulse Rate (!) 107  Resp 16  Level of Consciousness Alert  SpO2 98 %  O2 Device Room Air  Assess: MEWS Score  MEWS Temp 0  MEWS Systolic 1  MEWS Pulse 1  MEWS RR 0  MEWS LOC 0  MEWS Score 2  MEWS Score Color Yellow  Assess: if the MEWS score is Yellow or Red  Were vital signs taken at a resting state? Yes  Focused Assessment No change from prior assessment  Early Detection of Sepsis Score *See Row Information* Medium  MEWS guidelines implemented *See Row Information* Yes  Treat  MEWS Interventions Escalated (See documentation below)  Pain Scale 0-10  Pain Score 5  Pain Type Chronic pain  Pain Location Leg  Pain Orientation Right;Left  Pain Descriptors / Indicators Aching;Tingling  Pain Frequency Constant  Pain Onset On-going  Patients Stated Pain Goal 3  Multiple Pain Sites No  Take Vital Signs  Increase Vital Sign Frequency  Yellow: Q 2hr X 2 then Q 4hr X 2, if remains yellow, continue Q 4hrs  Escalate  MEWS: Escalate Yellow: discuss with charge nurse/RN and consider discussing with provider and RRT  Notify: Charge Nurse/RN  Name of Charge Nurse/RN Notified [place], RN  Date Charge Nurse/RN Notified 05/22/20  Time Charge Nurse/RN Notified 0436  Notify: Provider  Provider Name/Title b. Evonnie Dawes  Date Provider Notified 05/22/20  Time Provider Notified [phone]  Notification Type Page  Notification Reason Other (Comment) (Yellow MEWS)  Provider response See new orders  Date of Provider Response 05/22/20  Time of Provider Response [phone]  Document  Progress note created (see row info) Yes

## 2020-05-22 NOTE — ED Notes (Signed)
MRI contacted this RN, unable to perform scan due to restless leg syndrome. Awaiting pt return from MRI.

## 2020-05-22 NOTE — ED Notes (Signed)
Patient transported to MRI 

## 2020-05-22 NOTE — H&P (Addendum)
History and Physical    Madison Hurst TFT:[id number] DOB: 1961/02/26 DOA: 05/09/2020  PCP: Buckner Malta, MD  Patient coming from: Home  I have personally briefly reviewed patient's old medical records in [facility]  Chief Complaint: Urinary retention  HPI: Madison Hurst is a 60 y.o. 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    Pt then sent to rehab and ultimately discharged from hospital home on 2/17.  Pt presents to the ED with new onset of inability to urinate for the past 24h.  Feels like she needs to urinate but unable to do so.  No severe abd pain, does have some distention.  No dysuria, fever, flank pain.   ED Course: Sodium 120.  500cc on bladder scan, foley put in.  CT abd/pelvis neg for acute findings.  Husband reports poor PO intake recently.   Review of Systems: As per HPI, otherwise all review of systems negative.  Past Medical History:  Diagnosis Date  . Anemia 02/09/2020  . Anxiety 10/28/2019  . Depressed left ventricular ejection fraction 03/13/2020  . Depressive disorder 10/28/2019  . DOE (dyspnea on exertion) 03/13/2020  . Hypothyroidism 02/09/2020  . Iron deficiency anemia 01/24/2020  . Metabolic syndrome 03/13/2020  . Multinodular goiter 03/07/2015  . PAC (premature atrial contraction) 03/13/2020  . PAT (paroxysmal atrial tachycardia) (HCC) 03/13/2020  . Pericardial effusion 02/09/2020  . 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 Type 2 diabetes mellitus (HCC) 05/08/2019  . Vitamin D deficiency 02/09/2020    Past Surgical History:  Procedure Laterality Date  . BUBBLE STUDY  04/24/2020   Procedure: BUBBLE STUDY;  Surgeon: Meriam Sprague, MD;  Location: [facility]  ENDOSCOPY;  Service: Cardiovascular;;  . CESAREAN SECTION    . CHOLECYSTECTOMY    . other     Growth removal on chest  . TEE WITHOUT CARDIOVERSION N/A 04/24/2020   Procedure: TRANSESOPHAGEAL ECHOCARDIOGRAM (TEE);  Surgeon: Meriam Sprague, MD;  Location: [facility] ENDOSCOPY;  Service: Cardiovascular;  Laterality: N/A;  . TOTAL SHOULDER ARTHROPLASTY Left      reports that she quit smoking about 26 years ago. She has never used smokeless tobacco. She reports that she does not drink alcohol. No history on file for drug use.  Allergies  Allergen Reactions  . Sulfa Antibiotics   . Sulfamethoxazole-Trimethoprim Rash    Family History  Problem Relation Age of Onset  . Muscular dystrophy Mother   . Diabetes Father   . Diabetes Brother   . Heart disease Brother      Prior to Admission medications   Medication Sig Start Date End Date Taking? Authorizing Provider  acetaminophen (TYLENOL) 325 MG tablet Take 2 tablets (650 mg total) by mouth every 6 (six) hours as needed for mild pain or moderate pain. 05/15/20  Yes Angiulli, Mcarthur Rossetti, PA-C  aspirin EC 81 MG tablet Take 1 tablet (81 mg total) by mouth daily. Swallow whole. 04/12/20  Yes Tobb, Kardie, DO  buPROPion (WELLBUTRIN) 100 MG tablet Take 100 mg by mouth daily.   Yes [provider]  diclofenac Sodium (VOLTAREN) 1 % GEL Apply 2 g topically 4 (four) times daily. 05/15/20  Yes Angiulli, Mcarthur Rossetti, PA-C  DULoxetine (CYMBALTA) 30 MG capsule Take 1 capsule (30 mg total) by mouth daily. 05/16/20  Yes Angiulli, Mcarthur Rossetti, PA-C  ergocalciferol (VITAMIN D2) 1.25 MG (50000 UT) capsule Take 1 capsule (50,000 Units total) by mouth once a week. 05/15/20  Yes Angiulli, Mcarthur Rossetti, PA-C  folic acid (FOLVITE) 1 MG tablet Take 2 tablets (2 mg total) by mouth daily. 05/15/20  Yes Angiulli, Mcarthur Rossetti, PA-C  levothyroxine (SYNTHROID) 75 MCG tablet Take 1 tablet (75 mcg total) by mouth daily before breakfast. 05/15/20  Yes Angiulli, Mcarthur Rossetti, PA-C  melatonin 5  MG TABS Take 1 tablet (5 mg total) by mouth at bedtime. 05/15/20  Yes Angiulli, Mcarthur Rossetti, PA-C  metFORMIN (GLUCOPHAGE) 500 MG tablet Take 1 tablet (500 mg total) by mouth 2 (two) times daily with a meal. 05/15/20  Yes Angiulli, Mcarthur Rossetti, PA-C  Multiple Vitamin (MULTIVITAMIN) capsule Take 1 capsule by mouth daily.   Yes [provider]  pantoprazole (PROTONIX) 40 MG tablet Take 1 tablet (40 mg total) by mouth daily. 05/15/20  Yes Angiulli, Mcarthur Rossetti, PA-C  predniSONE (DELTASONE) 5 MG tablet Take 1 tablet (5 mg total) by mouth daily. 05/15/20  Yes Angiulli, Mcarthur Rossetti, PA-C  buPROPion (WELLBUTRIN XL) 150 MG 24 hr tablet Take 1 tablet (150 mg total) by mouth daily. 05/15/20   Charlton Amor, PA-C    Physical Exam: Vitals:   05/08/2020 1915 05/20/2020 2015 05/11/2020 2045 05/09/2020 2100  BP: 95/63 102/69 (!) 128/92 98/71  Pulse: (!) 114  (!) 110 (!) 111  Resp: 19 (!) 21 18 (!) 21  Temp:      TempSrc:      SpO2: 99%  98% 100%    Constitutional: NAD, calm, comfortable Eyes: PERRL, lids and conjunctivae normal ENMT: Mucous membranes are moist. Posterior pharynx clear of any exudate or lesions.Normal dentition.  Neck: normal, supple, no masses, no thyromegaly Respiratory: clear to auscultation bilaterally, no wheezing, no crackles. Normal respiratory effort. No accessory muscle use.  Cardiovascular: Regular rate and rhythm, no murmurs / rubs / gallops. No extremity edema. 2+ pedal pulses. No carotid bruits.  Abdomen: no tenderness, no masses palpated. No hepatosplenomegaly. Bowel sounds positive.  Musculoskeletal: no clubbing / cyanosis. No joint deformity upper and lower extremities. Good ROM, no contractures. Normal muscle tone.  Skin: no rashes, lesions, ulcers. No induration Neurologic: CN 2-12 grossly intact. Sensation intact, DTR normal. Strength 5/5 in all 4.  Psychiatric: Normal judgment and insight. Alert and oriented x 3. Normal mood.    Labs on Admission: I have personally reviewed  following labs and imaging studies  CBC: Recent Labs  Lab 05/12/2020 2016  WBC 12.9*  NEUTROABS 11.1*  HGB 11.3*  HCT 36.2  MCV 107.4*  PLT 352   Basic Metabolic Panel: Recent Labs  Lab 05/10/2020 2016  NA 120*  K 4.4  CL 85*  CO2 19*  GLUCOSE 142*  BUN 12  CREATININE 0.70  CALCIUM 8.0*   GFR: Estimated Creatinine Clearance: 55.7 mL/min (by C-G formula based on SCr of 0.7 mg/dL). Liver Function Tests: No results for input(s): AST, ALT, ALKPHOS, BILITOT, PROT, ALBUMIN in the last 168 hours. No results for input(s): LIPASE, AMYLASE in the last 168 hours. No results for input(s): AMMONIA in the last 168 hours. Coagulation Profile: No results for input(s): INR, PROTIME in the last 168 hours. Cardiac Enzymes: No results for input(s): CKTOTAL, CKMB, CKMBINDEX, TROPONINI in the last 168 hours. BNP (last 3 results) No results for input(s): PROBNP in the last 8760 hours. HbA1C: No results for input(s): HGBA1C in the last 72 hours. CBG:  Recent Labs  Lab 05/15/20 1240 05/15/20 1637 05/15/20 2126 05/16/20 0549 05/16/20 1116  GLUCAP 154* 148* 146* 101* 127*   Lipid Profile: No results for input(s): CHOL, HDL, LDLCALC, TRIG, CHOLHDL, LDLDIRECT in the last 72 hours. Thyroid Function Tests: No results for input(s): TSH, T4TOTAL, FREET4, T3FREE, THYROIDAB in the last 72 hours. Anemia Panel: No results for input(s): VITAMINB12, FOLATE, FERRITIN, TIBC, IRON, RETICCTPCT in the last 72 hours. Urine analysis:    Component Value Date/Time   COLORURINE AMBER (A) 05/07/2020 1715   APPEARANCEUR CLEAR 05/08/2020 1715   LABSPEC 1.018 05/08/2020 1715   PHURINE 5.0 05/11/2020 1715   GLUCOSEU NEGATIVE 05/10/2020 1715   HGBUR NEGATIVE 05/23/2020 1715   BILIRUBINUR NEGATIVE 05/03/2020 1715   KETONESUR NEGATIVE 05/10/2020 1715   PROTEINUR NEGATIVE 05/05/2020 1715   NITRITE NEGATIVE 05/18/2020 1715   LEUKOCYTESUR NEGATIVE 05/19/2020 1715    Radiological Exams on Admission: CT  ABDOMEN PELVIS W CONTRAST  Result Date: 05/25/2020 CLINICAL DATA:  Unable to have bowel movement or urinate EXAM: CT ABDOMEN AND PELVIS WITH CONTRAST TECHNIQUE: Multidetector CT imaging of the abdomen and pelvis was performed using the standard protocol following bolus administration of intravenous contrast. CONTRAST:  OMNIPAQUE IOHEXOL 300 MG/ML  SOLN COMPARISON:  CT 01/26/2015, cardiac CT 04/11/2020 FINDINGS: Lower chest: Lung bases demonstrate scarring or atelectasis at the posterior bases. No acute consolidation. Irregular density within the lingula measuring 6 mm average diameter, series 5, image number 15. Borderline to mild cardiomegaly. Hepatobiliary: Heterogenous liver enhancement without focal lesion, question passive congestion. Status post cholecystectomy. No biliary dilatation. Pancreas: No inflammatory change. 13 mm cystic lesion at the tail of the pancreas, series 3, image number 21. Spleen: Normal in size without focal abnormality. Adrenals/Urinary Tract: Adrenal glands are within normal limits. Small cysts in the kidneys. No hydronephrosis. Punctate stones within the lower poles. Foley catheter and air in the bladder which appears slightly thick-walled. Stomach/Bowel: Stomach nonenlarged. No dilated small bowel. No acute bowel wall thickening. Negative appendix. Moderate stool in the colon. Vascular/Lymphatic: Moderate aortic atherosclerosis. No aneurysm. No suspicious nodes Reproductive: Uterus and bilateral adnexa are unremarkable. Other: Negative for free air or free fluid. Musculoskeletal: 5 mm anterolisthesis L4 on L5 with degenerative changes. Heterogenous mineralization as before. Advanced degenerative changes T11 T12. IMPRESSION: 1. No CT evidence for acute intra-abdominal or pelvic abnormality. Negative for bowel obstruction or bowel wall thickening. Moderate stool in the colon. 2. Punctate nonobstructing stones in the lower poles of the kidneys. No hydronephrosis. 3. 13 mm cystic  lesion at the tail of the pancreas. Recommend follow up pre and post contrast MRI/MRCP or pancreatic protocol CT in 1 year. This recommendation follows ACR consensus guidelines: Management of Incidental Pancreatic Cysts: A White Paper of the ACR Incidental Findings Committee. J Am Coll Radiol 2017;14:911-923. 4. 6 mm irregular density/possible not in the lingula. Non-contrast chest CT at 6-12 months is recommended. If the nodule is stable at time of repeat CT, then future CT at 18-24 months (from today's scan) is considered optional for low-risk patients, but is recommended for high-risk patients. This recommendation follows the consensus statement: Guidelines for Management of Incidental Pulmonary Nodules Detected on CT Images: From the Fleischner Society 2017; Radiology 2017; 284:228-243. 5. Heterogenous liver enhancement, question passive congestion. Aortic Atherosclerosis (ICD10-I70.0). Electronically Signed   By: Jasmine Pang M.D.   On: 05/14/2020 21:23    EKG: Independently reviewed.  Assessment/Plan Principal Problem:   Hyponatremia Active Problems:   PAT (paroxysmal atrial tachycardia) (HCC)  Chronic combined systolic and diastolic CHF (congestive heart failure) (HCC)   Rheumatoid arthritis involving multiple sites [facility])   Right middle cerebral artery stroke (HCC)   Benign essential HTN   Diabetes mellitus type 2 in nonobese (HCC)   Acute urinary retention    1. Hyponatremia - 1. Urine sodium < 10 2. Most likely secondary to poor PO intake 3. NS at 100, will order 500cc bolus 4. Repeat BMP in AM 2. Acute urinary retention - 1. Due to hyponatremia? 2. Treat hyponatremia as above 3. Foley in place 4. Per neurology: check repeat MRI c-spine to exclude cord compression given stenosis seen last month. 3. DM2 - 1. Hold home metformin 2. Sensitive SSI AC 4. HTN - 1. Looks like pt off of HTN meds at this point 5. RA - 1. Cont prednisone 6. PAT / PACs - 1. Following outpt with  cardiology 2. Tried metoprolol in Jan but BP was too low so this was stopped. 7. ICM - 1. EF 20-25% 2. Off BP meds at this point due to soft BPs including during last admit. 8. Pancreatic lesion, pulmonary lesion - 1. Needs follow up imaging as described in CT scan today.  DVT prophylaxis: Lovenox Code Status: Full Family Communication: No family in room Disposition Plan: Home after sodium improved Consults called: none Admission status: Place in 85    Emmy Keng M. DO [facility]  How to contact the [facility] Attending or Consulting provider 7A - 7P or covering provider during after hours 7P -7A, for this patient?  1. Check the care team in [facility] and look for a) attending/consulting [facility] provider listed and b) the [facility] team listed 2. Log into [email or web address]  Amion Physician Scheduling and messaging for groups and whole hospitals  On call and physician scheduling software for group practices, residents, hospitalists and other medical providers for call, clinic, rotation and shift schedules. On[employer] is a hospital-wide system for scheduling doctors and paging doctors on call. EasyPlot is for scientific plotting and data analysis.  [email or web address]  and use [place]'s universal password to access. If you do not have the password, please contact the hospital operator.  3. Locate the [facility] provider you are looking for under [facility] and page to a number that you can be directly reached. 4. If you still have difficulty reaching the provider, please page the [facility] (Director on Call) for the Hospitalists listed on amion for assistance.  05/22/2020, 12:29 AM

## 2020-05-23 ENCOUNTER — Inpatient Hospital Stay (HOSPITAL_COMMUNITY): Payer: Medicare PPO

## 2020-05-23 ENCOUNTER — Inpatient Hospital Stay: Payer: Self-pay

## 2020-05-23 DIAGNOSIS — R4182 Altered mental status, unspecified: Secondary | ICD-10-CM

## 2020-05-23 DIAGNOSIS — E871 Hypo-osmolality and hyponatremia: Secondary | ICD-10-CM | POA: Diagnosis not present

## 2020-05-23 DIAGNOSIS — E440 Moderate protein-calorie malnutrition: Secondary | ICD-10-CM | POA: Insufficient documentation

## 2020-05-23 LAB — CBC WITH DIFFERENTIAL/PLATELET
Abs Immature Granulocytes: 0.13 10*3/uL — ABNORMAL HIGH (ref 0.00–0.07)
Abs Immature Granulocytes: 0.12 10*3/uL — ABNORMAL HIGH (ref 0.00–0.07)
Basophils Absolute: 0 10*3/uL (ref 0.0–0.1)
Basophils Absolute: 0 10*3/uL (ref 0.0–0.1)
Basophils Relative: 0 %
Basophils Relative: 0 %
Eosinophils Absolute: 0 10*3/uL (ref 0.0–0.5)
Eosinophils Absolute: 0 10*3/uL (ref 0.0–0.5)
Eosinophils Relative: 0 %
Eosinophils Relative: 0 %
HCT: 36.1 % (ref 36.0–46.0)
HCT: 32.4 % — ABNORMAL LOW (ref 36.0–46.0)
Hemoglobin: 11.2 g/dL — ABNORMAL LOW (ref 12.0–15.0)
Hemoglobin: 10.5 g/dL — ABNORMAL LOW (ref 12.0–15.0)
Immature Granulocytes: 1 %
Immature Granulocytes: 1 %
Lymphocytes Relative: 7 %
Lymphocytes Relative: 2 %
Lymphs Abs: 0.9 10*3/uL (ref 0.7–4.0)
Lymphs Abs: 0.2 10*3/uL — ABNORMAL LOW (ref 0.7–4.0)
MCH: 33.5 pg (ref 26.0–34.0)
MCH: 34.9 pg — ABNORMAL HIGH (ref 26.0–34.0)
MCHC: 31 g/dL (ref 30.0–36.0)
MCHC: 32.4 g/dL (ref 30.0–36.0)
MCV: 108.1 fL — ABNORMAL HIGH (ref 80.0–100.0)
MCV: 107.6 fL — ABNORMAL HIGH (ref 80.0–100.0)
Monocytes Absolute: 0.8 10*3/uL (ref 0.1–1.0)
Monocytes Absolute: 0.2 10*3/uL (ref 0.1–1.0)
Monocytes Relative: 6 %
Monocytes Relative: 2 %
Neutro Abs: 11.2 10*3/uL — ABNORMAL HIGH (ref 1.7–7.7)
Neutro Abs: 11.8 10*3/uL — ABNORMAL HIGH (ref 1.7–7.7)
Neutrophils Relative %: 86 %
Neutrophils Relative %: 95 %
Platelets: 300 10*3/uL (ref 150–400)
Platelets: 304 10*3/uL (ref 150–400)
RBC: 3.34 MIL/uL — ABNORMAL LOW (ref 3.87–5.11)
RBC: 3.01 MIL/uL — ABNORMAL LOW (ref 3.87–5.11)
RDW: 21.4 % — ABNORMAL HIGH (ref 11.5–15.5)
RDW: 21.4 % — ABNORMAL HIGH (ref 11.5–15.5)
Smear Review: NORMAL
Smear Review: NORMAL
WBC: 13 10*3/uL — ABNORMAL HIGH (ref 4.0–10.5)
WBC: 12.4 10*3/uL — ABNORMAL HIGH (ref 4.0–10.5)
nRBC: 0.2 % (ref 0.0–0.2)
nRBC: 0 % (ref 0.0–0.2)

## 2020-05-23 LAB — BASIC METABOLIC PANEL
Anion gap: 13 (ref 5–15)
BUN: 11 mg/dL (ref 6–20)
CO2: 18 mmol/L — ABNORMAL LOW (ref 22–32)
Calcium: 7.7 mg/dL — ABNORMAL LOW (ref 8.9–10.3)
Chloride: 93 mmol/L — ABNORMAL LOW (ref 98–111)
Creatinine, Ser: 0.59 mg/dL (ref 0.44–1.00)
GFR, Estimated: >60 mL/min (ref >60)
Glucose, Bld: 132 mg/dL — ABNORMAL HIGH (ref 70–99)
Potassium: 3.5 mmol/L (ref 3.5–5.1)
Sodium: 124 mmol/L — ABNORMAL LOW (ref 135–145)

## 2020-05-23 LAB — COMPREHENSIVE METABOLIC PANEL
ALT: 245 U/L — ABNORMAL HIGH (ref 0–44)
ALT: 245 U/L — ABNORMAL HIGH (ref 0–44)
AST: 123 U/L — ABNORMAL HIGH (ref 15–41)
AST: 92 U/L — ABNORMAL HIGH (ref 15–41)
Albumin: 2.1 g/dL — ABNORMAL LOW (ref 3.5–5.0)
Albumin: 2 g/dL — ABNORMAL LOW (ref 3.5–5.0)
Alkaline Phosphatase: 203 U/L — ABNORMAL HIGH (ref 38–126)
Alkaline Phosphatase: 196 U/L — ABNORMAL HIGH (ref 38–126)
Anion gap: 14 (ref 5–15)
Anion gap: 13 (ref 5–15)
BUN: 11 mg/dL (ref 6–20)
BUN: 10 mg/dL (ref 6–20)
CO2: 17 mmol/L — ABNORMAL LOW (ref 22–32)
CO2: 18 mmol/L — ABNORMAL LOW (ref 22–32)
Calcium: 7.6 mg/dL — ABNORMAL LOW (ref 8.9–10.3)
Calcium: 7.8 mg/dL — ABNORMAL LOW (ref 8.9–10.3)
Chloride: 95 mmol/L — ABNORMAL LOW (ref 98–111)
Chloride: 97 mmol/L — ABNORMAL LOW (ref 98–111)
Creatinine, Ser: 0.62 mg/dL (ref 0.44–1.00)
Creatinine, Ser: 0.62 mg/dL (ref 0.44–1.00)
GFR, Estimated: >60 mL/min (ref >60)
GFR, Estimated: >60 mL/min (ref >60)
Glucose, Bld: 130 mg/dL — ABNORMAL HIGH (ref 70–99)
Glucose, Bld: 160 mg/dL — ABNORMAL HIGH (ref 70–99)
Potassium: 4.4 mmol/L (ref 3.5–5.1)
Potassium: 3.4 mmol/L — ABNORMAL LOW (ref 3.5–5.1)
Sodium: 126 mmol/L — ABNORMAL LOW (ref 135–145)
Sodium: 128 mmol/L — ABNORMAL LOW (ref 135–145)
Total Bilirubin: 1.8 mg/dL — ABNORMAL HIGH (ref 0.3–1.2)
Total Bilirubin: 1.6 mg/dL — ABNORMAL HIGH (ref 0.3–1.2)
Total Protein: 4.8 g/dL — ABNORMAL LOW (ref 6.5–8.1)
Total Protein: 4.6 g/dL — ABNORMAL LOW (ref 6.5–8.1)

## 2020-05-23 LAB — BLOOD GAS, ARTERIAL
Acid-base deficit: 4.4 mmol/L — ABNORMAL HIGH (ref 0.0–2.0)
Bicarbonate: 18.1 mmol/L — ABNORMAL LOW (ref 20.0–28.0)
FIO2: 21
O2 Saturation: 100 %
Patient temperature: 36.7
pCO2 arterial: 22.1 mmHg — ABNORMAL LOW (ref 32.0–48.0)
pH, Arterial: 7.521 — ABNORMAL HIGH (ref 7.350–7.450)
pO2, Arterial: 165 mmHg — ABNORMAL HIGH (ref 83.0–108.0)

## 2020-05-23 LAB — RAPID URINE DRUG SCREEN, HOSP PERFORMED
Amphetamines: NOT DETECTED
Barbiturates: NOT DETECTED
Benzodiazepines: POSITIVE — AB
Cocaine: NOT DETECTED
Opiates: NOT DETECTED
Tetrahydrocannabinol: NOT DETECTED

## 2020-05-23 LAB — PHOSPHORUS: Phosphorus: 1.9 mg/dL — ABNORMAL LOW (ref 2.5–4.6)

## 2020-05-23 LAB — GLUCOSE, CAPILLARY
Glucose-Capillary: 108 mg/dL — ABNORMAL HIGH (ref 70–99)
Glucose-Capillary: 124 mg/dL — ABNORMAL HIGH (ref 70–99)
Glucose-Capillary: 142 mg/dL — ABNORMAL HIGH (ref 70–99)
Glucose-Capillary: 177 mg/dL — ABNORMAL HIGH (ref 70–99)

## 2020-05-23 LAB — COOXEMETRY PANEL
Carboxyhemoglobin: 1.4 % (ref 0.5–1.5)
Methemoglobin: 0.7 % (ref 0.0–1.5)
O2 Saturation: 41.1 %
Total hemoglobin: 10.7 g/dL — ABNORMAL LOW (ref 12.0–16.0)

## 2020-05-23 LAB — PROCALCITONIN: Procalcitonin: 0.83 ng/mL

## 2020-05-23 LAB — LACTIC ACID, PLASMA: Lactic Acid, Venous: 1.9 mmol/L (ref 0.5–1.9)

## 2020-05-23 LAB — TSH: TSH: 0.369 u[IU]/mL (ref 0.350–4.500)

## 2020-05-23 LAB — AMMONIA: Ammonia: 38 umol/L — ABNORMAL HIGH (ref 9–35)

## 2020-05-23 LAB — VITAMIN B12: Vitamin B-12: 2105 pg/mL — ABNORMAL HIGH (ref 180–914)

## 2020-05-23 LAB — MAGNESIUM: Magnesium: 1.8 mg/dL (ref 1.7–2.4)

## 2020-05-23 LAB — SODIUM: Sodium: 124 mmol/L — ABNORMAL LOW (ref 135–145)

## 2020-05-23 LAB — TROPONIN I (HIGH SENSITIVITY)
Troponin I (High Sensitivity): 132 ng/L (ref <18)
Troponin I (High Sensitivity): 101 ng/L (ref <18)

## 2020-05-23 IMAGING — CT CT HEAD W/O CM
4 series · 17 of 47 positions shown, 19 images · non-contrast
Comparison: CT head [DATE]

CLINICAL DATA: Mental status change

EXAM:
CT HEAD WITHOUT CONTRAST
TECHNIQUE: Contiguous axial images were obtained from the base of the skull
through the vertex without intravenous contrast.

[Series 3: head without · axial · non-contrast · 0.41mm/px · z∈[-54,+66]mm · 7 of 33 slices shown, 9 images]
[im 5/33  brain]
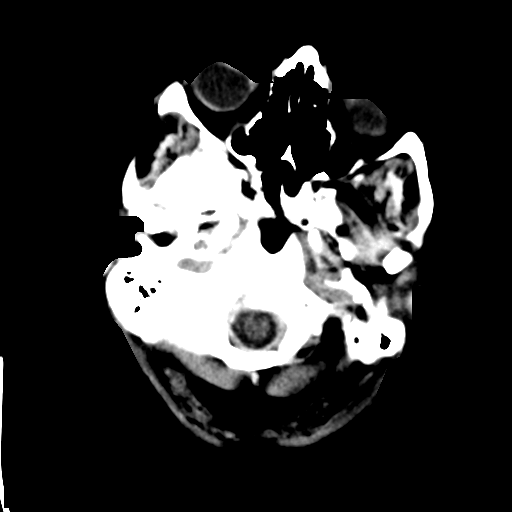
[im 5/33  bone]
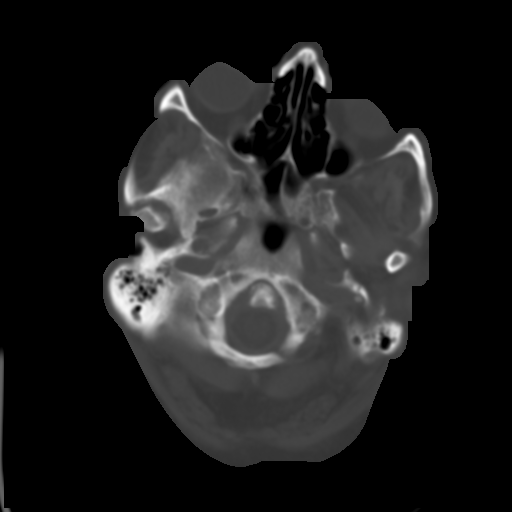
[im 9/33  brain]
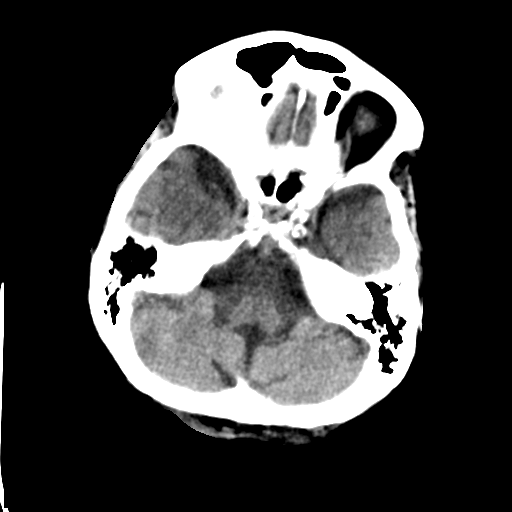
[im 13/33  brain]
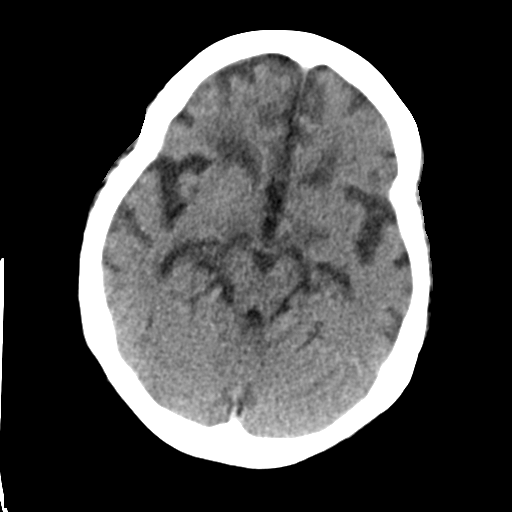
[im 17/33  brain]
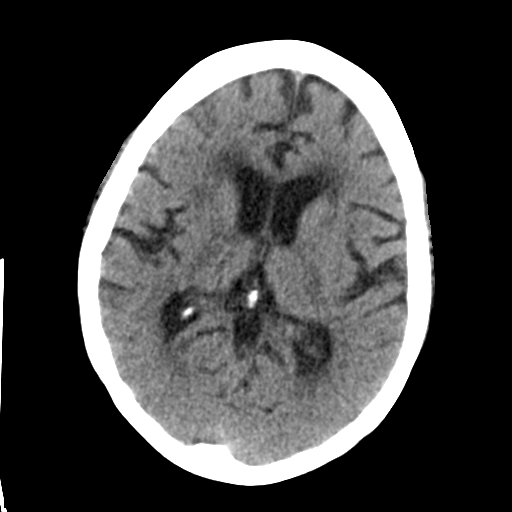
[im 21/33  brain]
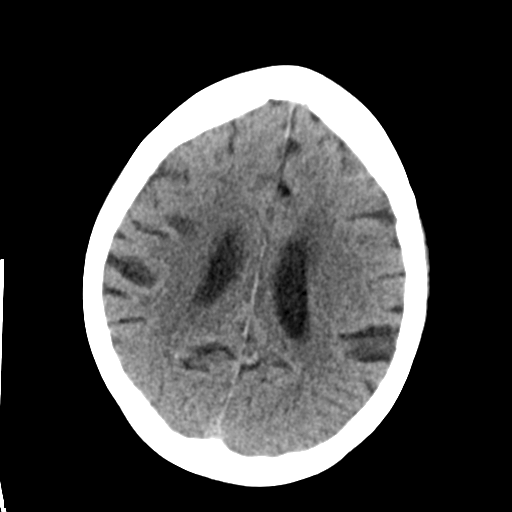
[im 21/33  bone]
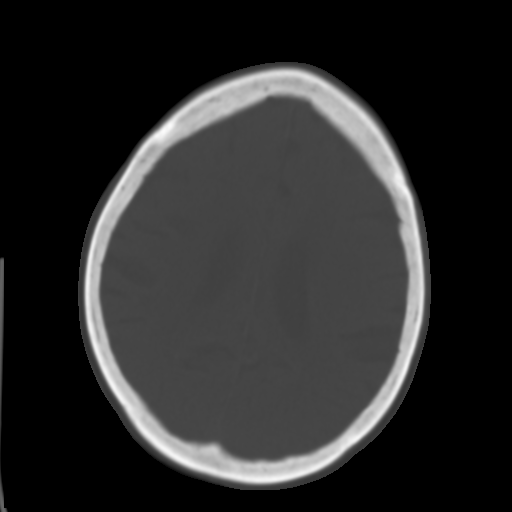
[im 25/33  brain]
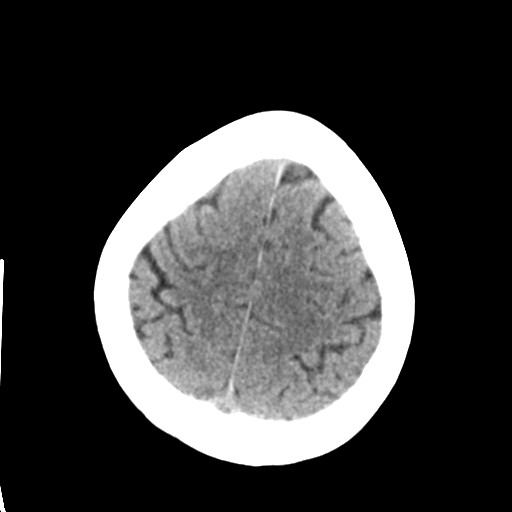
[im 29/33  brain]
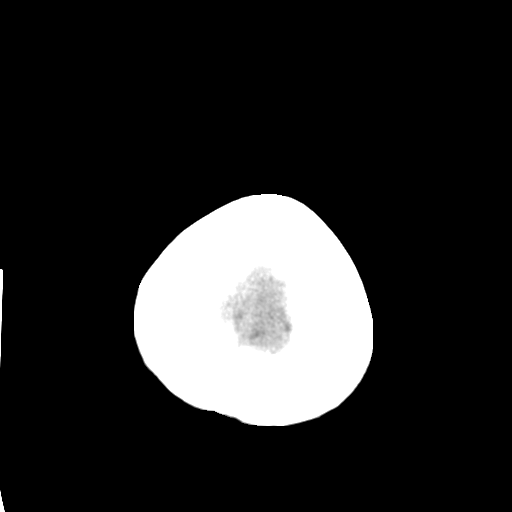

[Series 4: head bone · axial · 0.41mm/px · z∈[-58,-2]mm · 4 of 82 slices shown]
[im 9/82  bone]
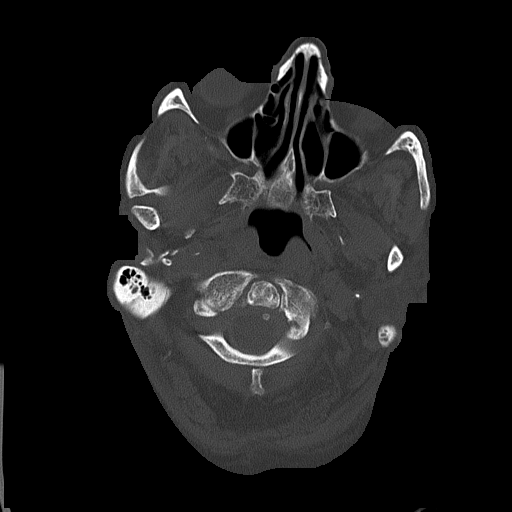
[im 17/82  bone]
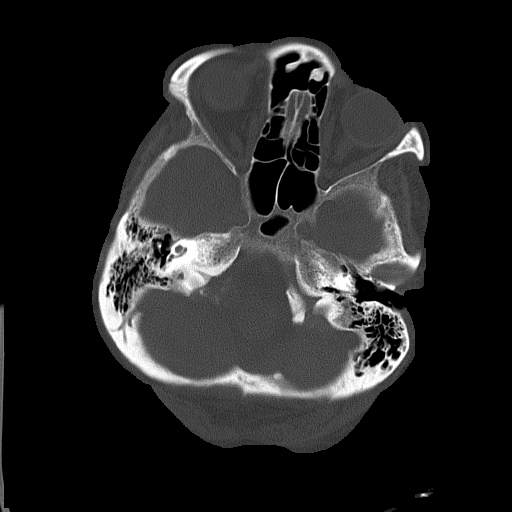
[im 25/82  bone]
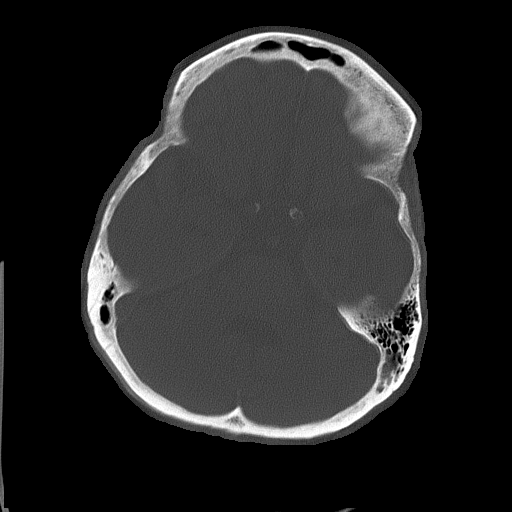
[im 37/82  bone]
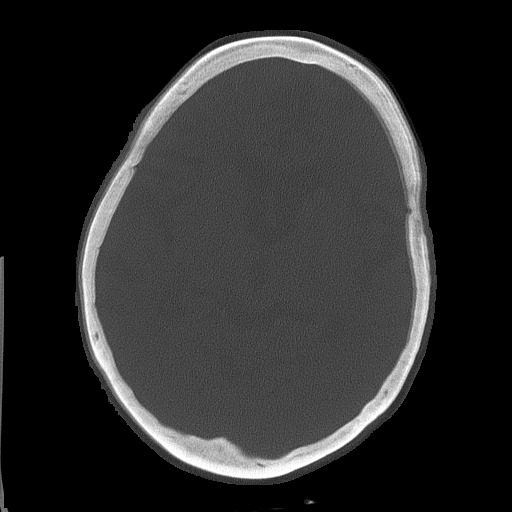

[Series 5: head without cor · coronal · non-contrast · 0.32mm/px · 3 of 67 slices shown]
[im 23/67  brain]
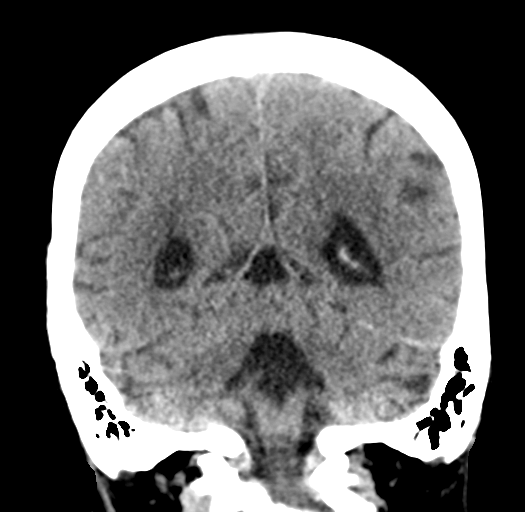
[im 30/67  brain]
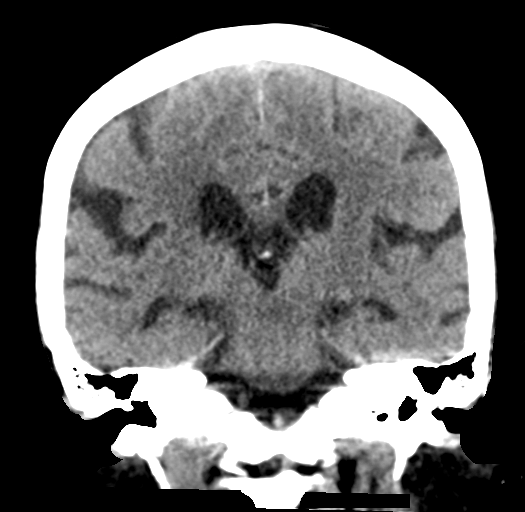
[im 37/67  brain]
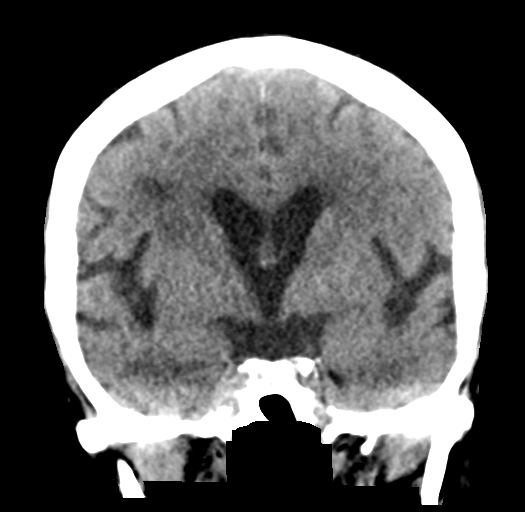

[Series 6: head without sag · sagittal · non-contrast · 0.32mm/px · 3 of 52 slices shown]
[im 18/52  brain]
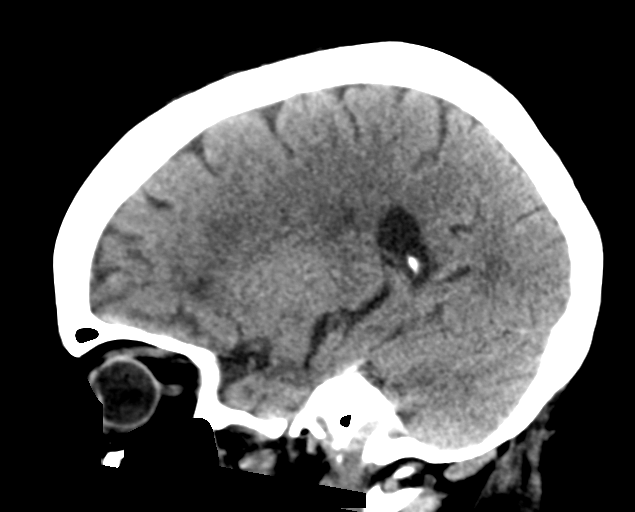
[im 26/52  brain]
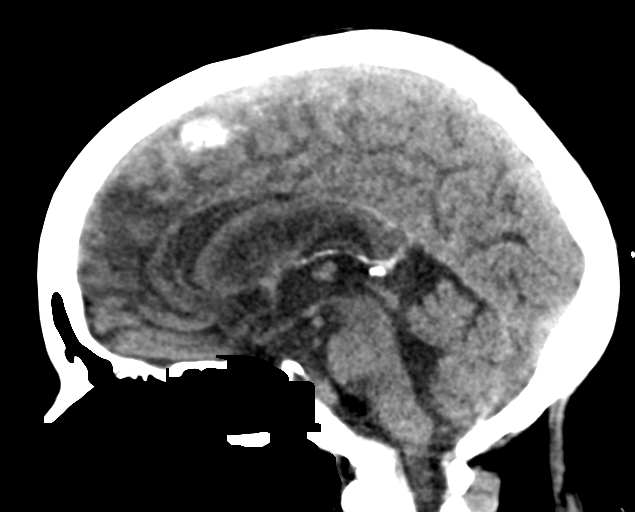
[im 35/52  brain]
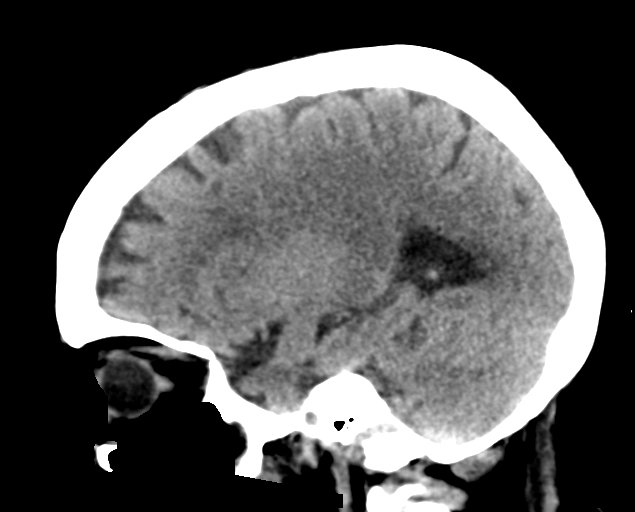

[17 of 47 positions shown; findings below may reference images not displayed]

FINDINGS: Brain: Moderate atrophy. Chronic white matter changes, stable from
the prior study.

Negative for acute infarct, hemorrhage, mass.

Vascular: Negative for hyperdense vessel

Skull: Negative

Sinuses/Orbits: Paranasal sinuses clear.  No orbital lesion

Other: None
IMPRESSION: No acute abnormality no interval change from prior study. Atrophy
and chronic white matter changes.

## 2020-05-23 IMAGING — DX DG CHEST 1V PORT
1 series · 1 of 1 positions shown · non-contrast
Comparison: [DATE]

CLINICAL DATA: Shortness of breath.

EXAM:
PORTABLE CHEST 1 VIEW

[chest]
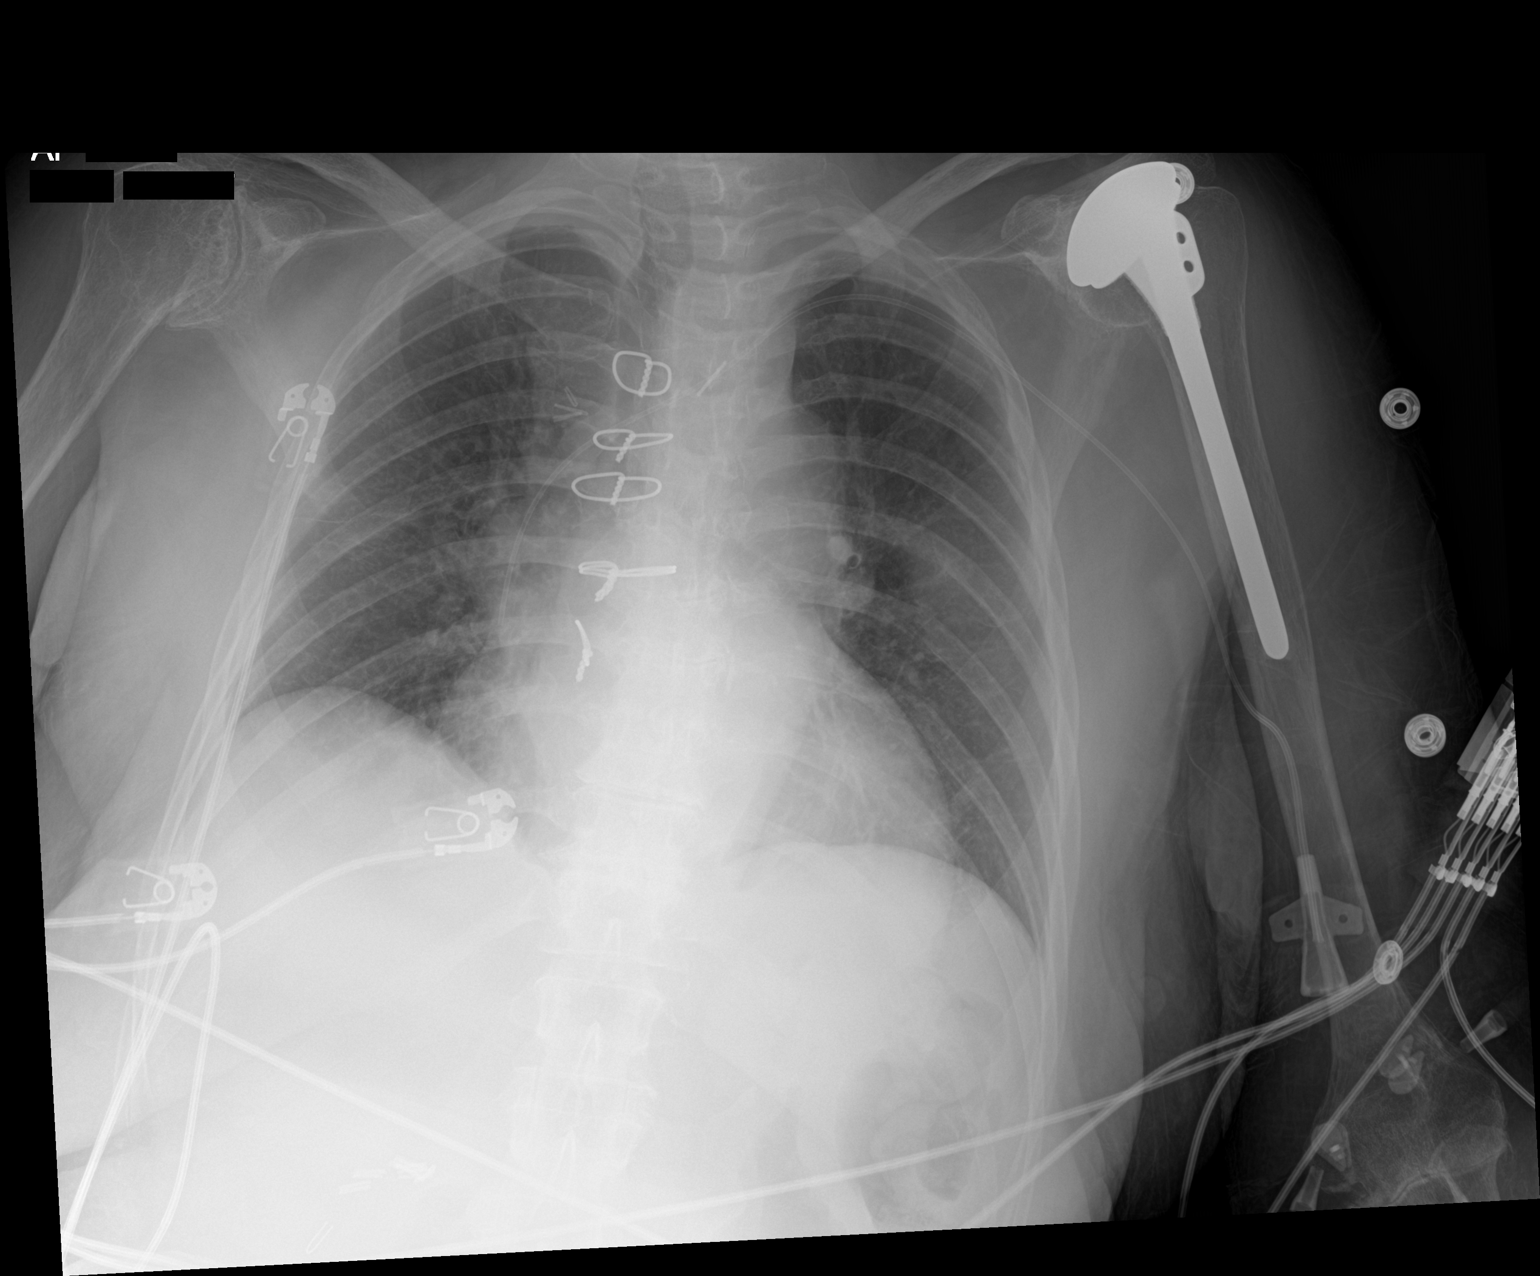

[1 of 1 positions shown; findings below may reference images not displayed]

FINDINGS: Multiple sternal wires are seen. A left-sided PICC line is present
with its distal tip noted at the junction of the superior vena cava
and right atrium. The heart size and mediastinal contours are within
normal limits. Both lungs are clear. Radiopaque surgical clips are
seen overlying the right upper quadrant. A stable left-sided
shoulder replacement is seen.
IMPRESSION: 1. Interval left-sided PICC line placement and positioning, as
described above, when compared to the prior study.
2. Evidence of prior median sternotomy without acute or active
cardiopulmonary disease.

## 2020-05-23 MED ORDER — MILRINONE LACTATE IN DEXTROSE 20-5 MG/100ML-% IV SOLN
0.2500 ug/kg/min | INTRAVENOUS | Status: DC
  Administered 2020-05-23: 0.25 ug/kg/min via INTRAVENOUS
  Filled 2020-05-23: qty 100

## 2020-05-23 MED ORDER — SODIUM CHLORIDE 0.9% FLUSH: 10.0000 mL | INTRAVENOUS | Status: DC | PRN

## 2020-05-23 MED ORDER — MAGNESIUM SULFATE 2 GM/50ML IV SOLN
2.0000 g | Freq: Once | INTRAVENOUS | Status: AC
  Administered 2020-05-23: 2 g via INTRAVENOUS
  Filled 2020-05-23: qty 50

## 2020-05-23 MED ORDER — METHYLPREDNISOLONE SODIUM SUCC 40 MG IJ SOLR
40.0000 mg | Freq: Every day | INTRAMUSCULAR | Status: DC
  Administered 2020-05-23: 40 mg via INTRAVENOUS
  Filled 2020-05-23: qty 1

## 2020-05-23 MED ORDER — NALOXONE HCL 0.4 MG/ML IJ SOLN
0.4000 mg | Freq: Once | INTRAMUSCULAR | Status: AC
  Administered 2020-05-23: 0.4 mg via INTRAMUSCULAR

## 2020-05-23 MED ORDER — POTASSIUM PHOSPHATES 15 MMOLE/5ML IV SOLN
30.0000 mmol | Freq: Once | INTRAVENOUS | Status: AC
  Administered 2020-05-23: 30 mmol via INTRAVENOUS
  Filled 2020-05-23: qty 10

## 2020-05-23 MED ORDER — ENSURE ENLIVE PO LIQD: 237.0000 mL | Freq: Two times a day (BID) | ORAL | Status: DC

## 2020-05-23 MED ORDER — HYALURONIDASE OVINE 200 UNIT/ML IJ SOLN
150.0000 [IU] | Freq: Once | INTRAMUSCULAR | Status: AC
  Administered 2020-05-23: 150 [IU] via SUBCUTANEOUS
  Filled 2020-05-23: qty 0.75

## 2020-05-23 MED ORDER — LEVOTHYROXINE SODIUM 100 MCG/5ML IV SOLN: 37.5000 ug | Freq: Every day | INTRAVENOUS | Status: DC

## 2020-05-23 MED ORDER — DIGOXIN 125 MCG PO TABS: 0.1250 mg | ORAL_TABLET | Freq: Every day | ORAL | Status: DC

## 2020-05-23 MED ORDER — SODIUM CHLORIDE 0.9% FLUSH
10.0000 mL | Freq: Two times a day (BID) | INTRAVENOUS | Status: DC
  Administered 2020-05-23: 10 mL

## 2020-05-23 MED ORDER — SODIUM CHLORIDE 0.9 % IV BOLUS
500.0000 mL | Freq: Once | INTRAVENOUS | Status: AC
  Administered 2020-05-23: 500 mL via INTRAVENOUS

## 2020-05-23 MED ORDER — NALOXONE HCL 0.4 MG/ML IJ SOLN
INTRAMUSCULAR | Status: AC
  Filled 2020-05-23: qty 1

## 2020-05-23 MED ORDER — POTASSIUM CHLORIDE 10 MEQ/100ML IV SOLN
10.0000 meq | INTRAVENOUS | Status: AC
  Administered 2020-05-23: 10 meq via INTRAVENOUS
  Filled 2020-05-23 (×2): qty 100

## 2020-05-23 MED ORDER — FLUMAZENIL 0.5 MG/5ML IV SOLN
0.3000 mg | Freq: Once | INTRAVENOUS | Status: AC
  Administered 2020-05-23: 0.3 mg via INTRAVENOUS
  Filled 2020-05-23: qty 5

## 2020-05-23 MED ORDER — PANTOPRAZOLE SODIUM 40 MG IV SOLR
40.0000 mg | INTRAVENOUS | Status: DC
  Administered 2020-05-23: 40 mg via INTRAVENOUS
  Filled 2020-05-23: qty 40

## 2020-05-23 MED ORDER — CALCIUM GLUCONATE-NACL 1-0.675 GM/50ML-% IV SOLN
1.0000 g | Freq: Once | INTRAVENOUS | Status: AC
  Administered 2020-05-23: 1000 mg via INTRAVENOUS
  Filled 2020-05-23: qty 50

## 2020-05-23 MED ORDER — BUPROPION HCL 100 MG PO TABS: 100.0000 mg | ORAL_TABLET | Freq: Every day | ORAL | Status: DC

## 2020-05-23 NOTE — Consult Note (Signed)
NAME:  Madison Hurst, MRN:  [id number], DOB:  10/13/1960, LOS: 1 ADMISSION DATE:  06/06/2020, CONSULTATION DATE:  05/23/20 REFERRING MD:  Margo Aye [facility]), CHIEF COMPLAINT:  AMS   Brief History:  60yo female with extensive PMH including RA on chronic prednisone, DM, HTN, PAF, recent admission 04/22/20 with acute R MCA stroke d/c to [facility] 05/16/20. She returned with decreased UOP found to have urinary retention and foley placed. But further w/u revealed Na 120 thought to be hypovolemic hyponatremia and she was admitted.  On 2/24 developed progressive somnolence and PCCM consulted.   History of Present Illness:  60yo female with extensive PMH including RA on chronic prednisone, DM, HTN, PAF, recent admission 04/22/20 with acute R MCA stroke d/c to [facility] 05/16/20. She returned with decreased UOP found to have urinary retention and foley placed. But further w/u revealed Na 120 thought to be hypovolemic hyponatremia and she was admitted.  On 2/24 developed progressive somnolence and PCCM consulted. Of note she had ativan 2mg  prior to CT abd/pelvis 2/23, given romazicon without improvement  Past Medical History:   has a past medical history of Anemia (02/09/2020), Anxiety (10/28/2019), Depressed left ventricular ejection fraction (03/13/2020), Depressive disorder (10/28/2019), DOE (dyspnea on exertion) (03/13/2020), Hypothyroidism (02/09/2020), Iron deficiency anemia (01/24/2020), Metabolic syndrome (03/13/2020), Multinodular goiter (03/07/2015), PAC (premature atrial contraction) (03/13/2020), PAT (paroxysmal atrial tachycardia) (HCC) (03/13/2020), Pericardial effusion (02/09/2020), Secondary generalized osteoporosis (02/09/2020), Seropositive rheumatoid arthritis (HCC) (02/09/2020), 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   Significant Hospital Events:    Consults:  Neuro 2/24>>>  Procedures:    Significant Diagnostic Tests:   CT abd/pelvis 2/22>> 1. No CT evidence for acute intra-abdominal or pelvic abnormality. Negative for bowel obstruction or bowel wall thickening. Moderate stool in the colon. 2. Punctate nonobstructing stones in the lower poles of the kidneys. No hydronephrosis. 3. 13 mm cystic lesion at the tail of the pancreas. EEG 2/24>>> This study is suggestive of moderate to severe diffuse encephalopathy, nonspecific etiology. No seizures or epileptiform discharges were seen throughout the recording. CT head 2/24>> neg acute   Micro Data:  RVP/covid 2/22>>> NEG   Antimicrobials:    Interim History / Subjective:  Lab at bedside attempting lab draws.  Pt somnolent but protecting airway, NAD, husband at bedside.  Husband states she is very sensitive to medication and has had similar issues after any type of sedation in the past.   Objective   Blood pressure 103/89, pulse 99, temperature 98.6 F (37 C), temperature source Axillary, resp. rate 16, height 5\' 5"  (1.651 m), weight 94.5 kg, SpO2 100 %.        Intake/Output Summary (Last 24 hours) at 05/23/2020 1510 Last data filed at 05/23/2020 0600 Gross per 24 hour  Intake 571.65 ml  Output 725 ml  Net -153.35 ml   Filed Weights   05/22/20 1300  Weight: 94.5 kg    Examination: General: chronically ill appearing female, NAD  HENT: mm moist, no JVD  Lungs: resps even non labored on RA, diminished bases otherwise clear  Cardiovascular: s1s2 rrr Abdomen: soft, mildly tender diffusely, no guarding, +bs Extremities: warm and dry, dark scaling rash BLE Neuro: somnolent, does not follow commands, lifts eyebrows to name but does not open eyes fully, nods occasionally, MAE, L sided weakness post stroke, protecting airway  Resolved Hospital Problem list     Assessment & Plan:   AMS - suspect multifactorial in setting Ativan, recent stroke, deconditioning, metabolic  derangements. EEG negative. CT head neg. No response to narcan or romazicon   PLAN -  Stat labs pending - cbc, cmet, ammonia, tsh, troponin, lactate  NPO  Consider neuro involvement given recent stroke - will await labs, etc prior.  Avoid sedating medications   ?ST elevation on EKG  PLAN -  TRH has consulted cardiology  Troponin pending   Hypovolemic hyponatremia - improving. Admission Na 120->124 (Na 130 on 05/14/20) PLAN -  Gentle volume  F/u Na q6h  EEG as above   Anion gap metabolic acidosis with respiratory over-compensation PLAN -  Labs pending as above   Per primary -  Ischemic cardiomyopathy - EF 20-25% 04/24/20 DM  Hypothyroid  GERD  RA - ensure continue steroids  Discussed at length with husband at bedside.  He is tearful.  He is hopeful that this is "all from the sedation" as he states she has had similar issues in the past.  However, he states her quality of life had been deteriorating at home over the last several months prior to her stroke, that she is in lots of pain and unable to do much other than sit and read.  She has now been in SNF since her stroke, non-ambulatory and only able to sit in a wheelchair for a short time with assistance.  He feels that if she were to worsen, she would not want aggressive care including intubation, CPR, defib. All medications ok for now.  Will modify code status.  Ok for tx to progressive unit, does not need ICU at this time.    Best practice (evaluated daily)  Diet: NPO Pain/Anxiety/Delirium protocol (if indicated): n/a VAP protocol (if indicated): n/a DVT prophylaxis: lovenox GI prophylaxis: ppi Glucose control: SSI Mobility: BR Disposition:ICU  Goals of Care:  Last date of multidisciplinary goals of care discussion:2/24 Family and staff present: husband, RN Summary of discussion: Discussed at length with husband at bedside.  He is tearful.  He is hopeful that this is "all from the sedation" as he states she has had similar issues in the past.  However, he states her quality of life had been  deteriorating at home over the last several months prior to her stroke, that she is in lots of pain and unable to do much other than sit and read.  She has now been in SNF since her stroke, non-ambulatory and only able to sit in a wheelchair for a short time with assistance.  He feels that if she were to worsen, she would not want aggressive care including intubation, CPR, defib. All medications ok for now.   Follow up goals of care discussion due: 3/3 Code Status: DNR/DNI  Labs   CBC: Recent Labs  Lab 05/22/2020 2016  WBC 12.9*  NEUTROABS 11.1*  HGB 11.3*  HCT 36.2  MCV 107.4*  PLT 352    Basic Metabolic Panel: Recent Labs  Lab 05/20/2020 2016 05/22/20 0559 05/22/20 1918 05/23/20 0125  NA 120* 121* 119* 124*  K 4.4 4.9  --  3.5  CL 85* 88*  --  93*  CO2 19* 18*  --  18*  GLUCOSE 142* 114*  --  132*  BUN 12 11  --  11  CREATININE 0.70 0.73  --  0.59  CALCIUM 8.0* 7.8*  --  7.7*   GFR: Estimated Creatinine Clearance: 88.2 mL/min (by C-G formula based on SCr of 0.59 mg/dL). Recent Labs  Lab 05/19/2020 2016  WBC 12.9*    Liver Function Tests: No  results for input(s): AST, ALT, ALKPHOS, BILITOT, PROT, ALBUMIN in the last 168 hours. No results for input(s): LIPASE, AMYLASE in the last 168 hours. No results for input(s): AMMONIA in the last 168 hours.  ABG    Component Value Date/Time   PHART 7.521 (H) 05/23/2020 0823   PCO2ART 22.1 (L) 05/23/2020 0823   PO2ART 165 (H) 05/23/2020 0823   HCO3 18.1 (L) 05/23/2020 0823   ACIDBASEDEF 4.4 (H) 05/23/2020 0823   O2SAT 100.0 05/23/2020 0823     Coagulation Profile: No results for input(s): INR, PROTIME in the last 168 hours.  Cardiac Enzymes: No results for input(s): CKTOTAL, CKMB, CKMBINDEX, TROPONINI in the last 168 hours.  HbA1C: Hgb A1c MFr Bld  Date/Time Value Ref Range Status  04/22/2020 06:38 PM 6.1 (H) 4.8 - 5.6 % Final    Comment:    (NOTE) Pre diabetes:          5.7%-6.4%  Diabetes:               >6.4%  Glycemic control for   <7.0% adults with diabetes     CBG: Recent Labs  Lab 05/22/20 1653 05/22/20 2037 05/22/20 2130 05/23/20 0734 05/23/20 1207  GLUCAP 178* 206* 154* 108* 124*    Review of Systems:   Unable, pt somnolent   Past Medical History:  She,  has a past medical history of Anemia (02/09/2020), Anxiety (10/28/2019), Depressed left ventricular ejection fraction (03/13/2020), Depressive disorder (10/28/2019), DOE (dyspnea on exertion) (03/13/2020), Hypothyroidism (02/09/2020), Iron deficiency anemia (01/24/2020), Metabolic syndrome (03/13/2020), Multinodular goiter (03/07/2015), PAC (premature atrial contraction) (03/13/2020), PAT (paroxysmal atrial tachycardia) (HCC) (03/13/2020), Pericardial effusion (02/09/2020), Secondary generalized osteoporosis (02/09/2020), Seropositive rheumatoid arthritis (HCC) (02/09/2020), 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   Surgical History:   Past Surgical History:  Procedure Laterality Date  . BUBBLE STUDY  04/24/2020   Procedure: BUBBLE STUDY;  Surgeon: Meriam Sprague, MD;  Location: [facility] ENDOSCOPY;  Service: Cardiovascular;;  . CESAREAN SECTION    . CHOLECYSTECTOMY    . other     Growth removal on chest  . TEE WITHOUT CARDIOVERSION N/A 04/24/2020   Procedure: TRANSESOPHAGEAL ECHOCARDIOGRAM (TEE);  Surgeon: Meriam Sprague, MD;  Location: [facility] ENDOSCOPY;  Service: Cardiovascular;  Laterality: N/A;  . TOTAL SHOULDER ARTHROPLASTY Left      Social History:   reports that she quit smoking about 26 years ago. She has never used smokeless tobacco. She reports that she does not drink alcohol.   Family History:  Her family history includes Diabetes in her brother and father; Heart disease in her brother; Muscular dystrophy in her mother.   Allergies Allergies  Allergen Reactions  . Sulfa Antibiotics   . Sulfamethoxazole-Trimethoprim Rash     Home  Medications  Prior to Admission medications   Medication Sig Start Date End Date Taking? Authorizing Provider  acetaminophen (TYLENOL) 325 MG tablet Take 2 tablets (650 mg total) by mouth every 6 (six) hours as needed for mild pain or moderate pain. 05/15/20  Yes Angiulli, Mcarthur Rossetti, PA-C  aspirin EC 81 MG tablet Take 1 tablet (81 mg total) by mouth daily. Swallow whole. 04/12/20  Yes Tobb, Kardie, DO  buPROPion (WELLBUTRIN) 100 MG tablet Take 100 mg by mouth daily.   Yes [provider]  diclofenac Sodium (VOLTAREN) 1 % GEL Apply 2 g topically 4 (four) times daily. 05/15/20  Yes Angiulli, Mcarthur Rossetti, PA-C  DULoxetine (CYMBALTA) 30 MG capsule Take 1 capsule (  30 mg total) by mouth daily. 05/16/20  Yes Angiulli, Mcarthur Rossetti, PA-C  ergocalciferol (VITAMIN D2) 1.25 MG (50000 UT) capsule Take 1 capsule (50,000 Units total) by mouth once a week. 05/15/20  Yes Angiulli, Mcarthur Rossetti, PA-C  folic acid (FOLVITE) 1 MG tablet Take 2 tablets (2 mg total) by mouth daily. 05/15/20  Yes Angiulli, Mcarthur Rossetti, PA-C  levothyroxine (SYNTHROID) 75 MCG tablet Take 1 tablet (75 mcg total) by mouth daily before breakfast. 05/15/20  Yes Angiulli, Mcarthur Rossetti, PA-C  melatonin 5 MG TABS Take 1 tablet (5 mg total) by mouth at bedtime. 05/15/20  Yes Angiulli, Mcarthur Rossetti, PA-C  metFORMIN (GLUCOPHAGE) 500 MG tablet Take 1 tablet (500 mg total) by mouth 2 (two) times daily with a meal. 05/15/20  Yes Angiulli, Mcarthur Rossetti, PA-C  Multiple Vitamin (MULTIVITAMIN) capsule Take 1 capsule by mouth daily.   Yes [provider]  pantoprazole (PROTONIX) 40 MG tablet Take 1 tablet (40 mg total) by mouth daily. 05/15/20  Yes Angiulli, Mcarthur Rossetti, PA-C  predniSONE (DELTASONE) 5 MG tablet Take 1 tablet (5 mg total) by mouth daily. 05/15/20  Yes Angiulli, Mcarthur Rossetti, PA-C  buPROPion (WELLBUTRIN XL) 150 MG 24 hr tablet Take 1 tablet (150 mg total) by mouth daily. 05/15/20   Angiulli, Mcarthur Rossetti, PA-C       Dirk Dress, NP Pulmonary/Critical Care Medicine   05/23/2020  3:10 PM

## 2020-05-23 NOTE — Progress Notes (Signed)
Patient remains yellow MEWS after completing initial protocol. Will continue q4h VS.

## 2020-05-23 NOTE — Consult Note (Signed)
Cardiology Consultation:  Patient ID: Madison Hurst MRN: [id number]; DOB: 1960/09/28  Admit date: 05/25/2020 Date of Consult: 05/23/2020  Primary Care Provider: Serita Grammes, MD Primary Cardiologist: Berniece Salines, DO   Patient Profile:  Madison Hurst is a 60 y.o. female with a hx of systolic heart failure, rheumatoid arthritis, hypertension, diabetes, CAD who is being seen today for the evaluation of elevated troponin at the request of Irene Pap, MD.  History of Present Illness:  Madison Hurst was admitted on 05/22/2020 with inability to urinate and found to have severe hyponatremia.  She was recently admitted in January for a right MCA stroke.  She was discharged to a skilled nursing facility on 05/16/2018.  She then represented with inability to urinate.  She had an extensive admission in January which she was found to have newly reduced systolic heart failure with an ejection fraction of 20 to 25%.  She apparently was being worked up in the outpatient setting and found to have two-vessel CAD.  This is a presumed ischemic cardiomyopathy.  Given her stroke ischemia evaluation with cardiac cath was deferred at that time.  It was felt she should go home and then be reevaluated.  On arrival on 05/22/2020 she was found to have severely reduced sodium down to 120.  Creatinine was stable at 0.70. She is also been found to have an elevated transaminitis with an AST of 123 and ALT 245.  Alk phos 203.  T bili elevated 1.8.  Labs also notable for white count elevated at 13,000.  Hemoglobin stable 11.2.  MCV noted to be 100.8.  Platelets stable.  Lactic acid 1.9.  Procalcitonin value 0.83. Critical care medicine was consulted for altered mental status.  A CT head was obtained that was negative for any acute process.  It appears she is now been made DNR/DNI by the critical care medicine team.  A CT abdomen and pelvis was obtained that does show passive hepatic congestion.  Her  hyponatremia was attributed to hypovolemia.  She has been given gentle hydration with improvement in her sodium.  She is noticeably acidotic with a bicarbonate of 18.  At the time my examination she is drowsy but will follow commands.  She is unable to speak clearly due to aphasia.  She does tell me she is in no pain.  She just appears drowsy.  Heart Pathway Score:       Past Medical History: Past Medical History:  Diagnosis Date  . Anemia 02/09/2020  . Anxiety 10/28/2019  . Depressed left ventricular ejection fraction 03/13/2020  . Depressive disorder 10/28/2019  . DOE (dyspnea on exertion) 03/13/2020  . Hypothyroidism 02/09/2020  . Iron deficiency anemia 01/24/2020  . Metabolic syndrome 29/56/2130  . Multinodular goiter 03/07/2015  . PAC (premature atrial contraction) 03/13/2020  . PAT (paroxysmal atrial tachycardia) ([place]) 03/13/2020  . Pericardial effusion 02/09/2020  . 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 Seropositive rheumatoid arthritis ([place]) 02/09/2020  . 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 Type 2 diabetes mellitus ([place]) 05/08/2019  . Vitamin D deficiency 02/09/2020    Past Surgical History: Past Surgical History:  Procedure Laterality Date  . BUBBLE STUDY  04/24/2020   Procedure: BUBBLE STUDY;  Surgeon: Freada Bergeron, MD;  Location: [facility];  Service: Cardiovascular;;  . CESAREAN SECTION    . CHOLECYSTECTOMY    . other     Growth removal on chest  . TEE WITHOUT CARDIOVERSION N/A 04/24/2020   Procedure: TRANSESOPHAGEAL ECHOCARDIOGRAM (TEE);  Surgeon: Johney Frame,  Greer Ee, MD;  Location: [facility];  Service: Cardiovascular;  Laterality: N/A;  . TOTAL SHOULDER ARTHROPLASTY Left      Home Medications:  Prior to Admission medications   Medication Sig Start Date End Date Taking? Authorizing Provider  acetaminophen (TYLENOL) 325 MG tablet Take 2 tablets (650 mg total) by mouth every 6 (six) hours as needed for mild pain or moderate pain. 05/15/20  Yes  Angiulli, Lavon Paganini, PA-C  aspirin EC 81 MG tablet Take 1 tablet (81 mg total) by mouth daily. Swallow whole. 04/12/20  Yes Tobb, Kardie, DO  buPROPion (WELLBUTRIN) 100 MG tablet Take 100 mg by mouth daily.   Yes [provider]  diclofenac Sodium (VOLTAREN) 1 % GEL Apply 2 g topically 4 (four) times daily. 05/15/20  Yes Angiulli, Lavon Paganini, PA-C  DULoxetine (CYMBALTA) 30 MG capsule Take 1 capsule (30 mg total) by mouth daily. 05/16/20  Yes Angiulli, Lavon Paganini, PA-C  ergocalciferol (VITAMIN D2) 1.25 MG (50000 UT) capsule Take 1 capsule (50,000 Units total) by mouth once a week. 05/15/20  Yes Angiulli, Lavon Paganini, PA-C  folic acid (FOLVITE) 1 MG tablet Take 2 tablets (2 mg total) by mouth daily. 05/15/20  Yes Angiulli, Lavon Paganini, PA-C  levothyroxine (SYNTHROID) 75 MCG tablet Take 1 tablet (75 mcg total) by mouth daily before breakfast. 05/15/20  Yes Angiulli, Lavon Paganini, PA-C  melatonin 5 MG TABS Take 1 tablet (5 mg total) by mouth at bedtime. 05/15/20  Yes Angiulli, Lavon Paganini, PA-C  metFORMIN (GLUCOPHAGE) 500 MG tablet Take 1 tablet (500 mg total) by mouth 2 (two) times daily with a meal. 05/15/20  Yes Angiulli, Lavon Paganini, PA-C  Multiple Vitamin (MULTIVITAMIN) capsule Take 1 capsule by mouth daily.   Yes [provider]  pantoprazole (PROTONIX) 40 MG tablet Take 1 tablet (40 mg total) by mouth daily. 05/15/20  Yes Angiulli, Lavon Paganini, PA-C  predniSONE (DELTASONE) 5 MG tablet Take 1 tablet (5 mg total) by mouth daily. 05/15/20  Yes Angiulli, Lavon Paganini, PA-C  buPROPion (WELLBUTRIN XL) 150 MG 24 hr tablet Take 1 tablet (150 mg total) by mouth daily. 05/15/20   Cathlyn Parsons, PA-C    Inpatient Medications: Scheduled Meds: . aspirin EC  81 mg Oral Daily  . Chlorhexidine Gluconate Cloth  6 each Topical Daily  . diclofenac Sodium  2 g Topical QID  . DULoxetine  30 mg Oral Daily  . enoxaparin (LOVENOX) injection  30 mg Subcutaneous Q24H  . feeding supplement  237 mL Oral BID BM  . folic acid  2 mg  Oral Daily  . insulin aspart  0-9 Units Subcutaneous TID WC  . [START ON 05/16/2020] levothyroxine  37.5 mcg Intravenous Daily  . melatonin  5 mg Oral QHS  . methylPREDNISolone (SOLU-MEDROL) injection  40 mg Intravenous Daily  . multivitamin with minerals  1 tablet Oral Daily  . naloxone      . pantoprazole (PROTONIX) IV  40 mg Intravenous Q24H  . sodium chloride flush  10-40 mL Intracatheter Q12H   Continuous Infusions: . potassium PHOSPHATE IVPB (in mmol)     PRN Meds: acetaminophen, ondansetron **OR** ondansetron (ZOFRAN) IV, sodium chloride flush  Allergies:    Allergies  Allergen Reactions  . Benzodiazepines Other (See Comments)    Lethargy  . Oxycodone Other (See Comments)    Lethargy   . Sulfa Antibiotics   . Sulfamethoxazole-Trimethoprim Rash    Social History:   Social History   Socioeconomic History  . Marital status:  Married    Spouse name: Not on file  . Number of children: Not on file  . Years of education: Not on file  . Highest education level: Not on file  Occupational History  . Not on file  Tobacco Use  . Smoking status: Former Smoker    Quit date: 02/11/1994    Years since quitting: 26.2  . Smokeless tobacco: Never Used  Vaping Use  . Vaping Use: Never used  Substance and Sexual Activity  . Alcohol use: Never  . Drug use: Not on file  . Sexual activity: Not on file  Other Topics Concern  . Not on file  Social History Narrative  . Not on file   Social Determinants of Health   Financial Resource Strain: Not on file  Food Insecurity: Not on file  Transportation Needs: Not on file  Physical Activity: Not on file  Stress: Not on file  Social Connections: Not on file  Intimate Partner Violence: Not on file     Family History:    Family History  Problem Relation Age of Onset  . Muscular dystrophy Mother   . Diabetes Father   . Diabetes Brother   . Heart disease Brother      ROS:  All other ROS reviewed and negative. Pertinent  positives noted in the HPI.     Physical Exam/Data:   Vitals:   05/23/20 1129 05/23/20 1222 05/23/20 1450 05/23/20 1620  BP: 107/69 101/79 103/89 96/68  Pulse: 99   (!) 103  Resp: 15 20 16 19   Temp: 98.2 F (36.8 C) 98.2 F (36.8 C) 98.6 F (37 C) 97.6 F (36.4 C)  TempSrc: Axillary Axillary Axillary Oral  SpO2: 100% 100% 100% 98%  Weight:      Height:         Intake/Output Summary (Last 24 hours) at 05/23/2020 1924 Last data filed at 05/23/2020 1715 Gross per 24 hour  Intake 571.65 ml  Output 725 ml  Net -153.35 ml    Last 3 Weights 05/22/2020 05/15/2020 04/26/2020  Weight (lbs) 208 lb 6.4 oz 102 lb 11.8 oz 124 lb 12.5 oz  Weight (kg) 94.53 kg 46.6 kg 56.6 kg    Body mass index is 34.68 kg/m.   General: Drowsy, ill-appearing Head: Atraumatic, normal size  Eyes: PEERLA, EOMI  Neck: Supple, JVD noted to be 12 to 15 cm of water Endocrine: No thryomegaly Cardiac: Normal S1, S2; tachycardia noted, no murmurs Lungs: Diminished breath sounds bilaterally Abd: Soft, nontender, no hepatomegaly  Ext: Trace edema Musculoskeletal: No deformities Skin: Warm and dry, no rashes   Neuro: Alert, awake, drowsy  EKG:  The EKG was personally reviewed and demonstrates:  Sinus tachycardia with IVCD  Telemetry:  Telemetry was personally reviewed and demonstrates: Sinus tachycardia with intermittent ectopic atrial tachycardia  Relevant CV Studies: TTE 04/23/2020 1. Left ventricular ejection fraction, by estimation, is 20 to 25%. The  left ventricle has severely decreased function. The left ventricle  demonstrates global hypokinesis. Left ventricular diastolic parameters are  indeterminate.  2. Right ventricular systolic function is normal. The right ventricular  size is normal.  3. Left atrial size was moderately dilated.  4. The mitral valve is normal in structure. Mild mitral valve  regurgitation. No evidence of mitral stenosis.  5. The aortic valve is tricuspid. Aortic valve  regurgitation is not  visualized. No aortic stenosis is present.  6. The inferior vena cava is normal in size with greater than 50%  respiratory variability, suggesting  right atrial pressure of 3 mmHg.   CCTA 04/12/2020  IMPRESSION: 1. Coronary calcium score of 1012. This was 29 percentile for age and sex matched control.  2. Normal coronary origin with right dominance.  3. Multivessel CAD. CADRADS 4A. This study will be send for FFRct. Consider cardiac catheterization.  Laboratory Data: High Sensitivity Troponin:   Recent Labs  Lab 05/23/20 1631  TROPONINIHS 132*     Cardiac EnzymesNo results for input(s): TROPONINI in the last 168 hours. No results for input(s): TROPIPOC in the last 168 hours.  Chemistry Recent Labs  Lab 05/23/20 0125 05/23/20 1359 05/23/20 1631  NA 124* 126* 128*  K 3.5 4.4 3.4*  CL 93* 95* 97*  CO2 18* 17* 18*  GLUCOSE 132* 130* 160*  BUN 11 11 10   CREATININE 0.59 0.62 0.62  CALCIUM 7.7* 7.6* 7.8*  GFRNONAA >60 >60 >60  ANIONGAP 13 14 13     Recent Labs  Lab 05/23/20 1359 05/23/20 1631  PROT 4.8* 4.6*  ALBUMIN 2.1* 2.0*  AST 123* 92*  ALT 245* 245*  ALKPHOS 203* 196*  BILITOT 1.8* 1.6*   Hematology Recent Labs  Lab 05/05/2020 2016 05/23/20 1359 05/23/20 1631  WBC 12.9* 13.0* 12.4*  RBC 3.37* 3.34* 3.01*  HGB 11.3* 11.2* 10.5*  HCT 36.2 36.1 32.4*  MCV 107.4* 108.1* 107.6*  MCH 33.5 33.5 34.9*  MCHC 31.2 31.0 32.4  RDW 21.2* 21.4* 21.4*  PLT 352 300 304   BNPNo results for input(s): BNP, PROBNP in the last 168 hours.  DDimer No results for input(s): DDIMER in the last 168 hours.  Radiology/Studies:  CT HEAD WO CONTRAST  Result Date: 05/23/2020 CLINICAL DATA:  Mental status change EXAM: CT HEAD WITHOUT CONTRAST TECHNIQUE: Contiguous axial images were obtained from the base of the skull through the vertex without intravenous contrast. COMPARISON:  CT head 05/07/2020 FINDINGS: Brain: Moderate atrophy. Chronic white matter  changes, stable from the prior study. Negative for acute infarct, hemorrhage, mass. Vascular: Negative for hyperdense vessel Skull: Negative Sinuses/Orbits: Paranasal sinuses clear.  No orbital lesion Other: None IMPRESSION: No acute abnormality no interval change from prior study. Atrophy and chronic white matter changes. Electronically Signed   By: Franchot Gallo M.D.   On: 05/23/2020 10:42   MR CERVICAL SPINE WO CONTRAST  Result Date: 05/22/2020 CLINICAL DATA:  Acute presentation with inability to urinate. Poor quality study done earlier today secondary to motion degradation. EXAM: MRI CERVICAL SPINE WITHOUT CONTRAST TECHNIQUE: Multiplanar, multisequence MR imaging of the cervical spine was performed. No intravenous contrast was administered. COMPARISON:  Earlier same day.  04/23/2020. FINDINGS: Yet again, the study is markedly degraded by motion. This is the third attempted exam over the last month and it would seem that the patient is not able to tolerate MRI without sedation. Alignment: No malalignment. Vertebrae: No evidence of fracture or primary bone lesion. Cord: Cord detail quite limited. No visible cord compression or primary cord lesion. Posterior Fossa, vertebral arteries, paraspinal tissues: Negative Disc levels: Degenerative spondylosis from C3-4 through C6-7. Osteophytic narrowing of the canal and foramina without detection of severe stenosis. IMPRESSION: 1. Abbreviated and motion degraded study. This is the third attempted exam over the last month and it would seem that the patient is not able to tolerate MRI without sedation. 2. Degenerative spondylosis from C3-4 through C6-7. No compressive canal or foraminal stenosis suspected. Detail remains quite poor. Electronically Signed   By: Nelson Chimes M.D.   On: 05/22/2020 16:33   MR CERVICAL  SPINE WO CONTRAST  Result Date: 05/22/2020 CLINICAL DATA:  60 year old female unable to have bowel movement or urinate. Status post cervical spine MRI  last month which was motion degraded. EXAM: MRI CERVICAL SPINE WITHOUT CONTRAST TECHNIQUE: Multiplanar, multisequence MR imaging of the cervical spine was performed. No intravenous contrast was administered. COMPARISON:  Cervical spine MRI 04/23/2020. FINDINGS: Severely motion degraded exam consisting of only sagittal images today. The exam is largely nondiagnostic. Sagittal T1 and T2 images suggest stable patency of the cervical spinal canal, without high-grade cervical spinal stenosis. Similar patency of the posterior fossa basilar cisterns is noted. There is no diagnostic imaging detail of the spinal cord today due to motion. IMPRESSION: Largely nondiagnostic exam today due to motion. Given the degree of excessive motion, the cervical spine would probably be better re-characterized by noncontrast CT unless this patient can be premedicated/sedated for MRI. Electronically Signed   By: Genevie Ann M.D.   On: 05/22/2020 04:37   CT ABDOMEN PELVIS W CONTRAST  Result Date: 05/03/2020 CLINICAL DATA:  Unable to have bowel movement or urinate EXAM: CT ABDOMEN AND PELVIS WITH CONTRAST TECHNIQUE: Multidetector CT imaging of the abdomen and pelvis was performed using the standard protocol following bolus administration of intravenous contrast. CONTRAST:  190m OMNIPAQUE IOHEXOL 300 MG/ML  SOLN COMPARISON:  CT 01/26/2015, cardiac CT 04/11/2020 FINDINGS: Lower chest: Lung bases demonstrate scarring or atelectasis at the posterior bases. No acute consolidation. Irregular density within the lingula measuring 6 mm average diameter, series 5, image number 15. Borderline to mild cardiomegaly. Hepatobiliary: Heterogenous liver enhancement without focal lesion, question passive congestion. Status post cholecystectomy. No biliary dilatation. Pancreas: No inflammatory change. 13 mm cystic lesion at the tail of the pancreas, series 3, image number 21. Spleen: Normal in size without focal abnormality. Adrenals/Urinary Tract: Adrenal  glands are within normal limits. Small cysts in the kidneys. No hydronephrosis. Punctate stones within the lower poles. Foley catheter and air in the bladder which appears slightly thick-walled. Stomach/Bowel: Stomach nonenlarged. No dilated small bowel. No acute bowel wall thickening. Negative appendix. Moderate stool in the colon. Vascular/Lymphatic: Moderate aortic atherosclerosis. No aneurysm. No suspicious nodes Reproductive: Uterus and bilateral adnexa are unremarkable. Other: Negative for free air or free fluid. Musculoskeletal: 5 mm anterolisthesis L4 on L5 with degenerative changes. Heterogenous mineralization as before. Advanced degenerative changes T11 T12. IMPRESSION: 1. No CT evidence for acute intra-abdominal or pelvic abnormality. Negative for bowel obstruction or bowel wall thickening. Moderate stool in the colon. 2. Punctate nonobstructing stones in the lower poles of the kidneys. No hydronephrosis. 3. 13 mm cystic lesion at the tail of the pancreas. Recommend follow up pre and post contrast MRI/MRCP or pancreatic protocol CT in 1 year. This recommendation follows ACR consensus guidelines: Management of Incidental Pancreatic Cysts: A White Paper of the ACR Incidental Findings Committee. [id number];60:737-106 4. 6 mm irregular density/possible not in the lingula. Non-contrast chest CT at 6-12 months is recommended. If the nodule is stable at time of repeat CT, then future CT at 18-24 months (from today's scan) is considered optional for low-risk patients, but is recommended for high-risk patients. This recommendation follows the consensus statement: Guidelines for Management of Incidental Pulmonary Nodules Detected on CT Images: From the Fleischner Society 2017; Radiology 2017; 284:228-243. 5. Heterogenous liver enhancement, question passive congestion. Aortic Atherosclerosis (ICD10-I70.0). Electronically Signed   By: KDonavan FoilM.D.   On: 05/20/2020 21:23   EEG adult  Result Date:  05/23/2020 YLora Havens MD  05/23/2020 12:16 PM Patient Name: Madison Hurst MRN: [id number] Epilepsy Attending: Lora Havens Referring Physician/Provider: Dr. Francia Greaves Date: 05/23/2020 Duration: 24.13 mins Patient history: 60 year old female with altered mental status.  EEG to evaluate for seizures. Level of alertness: Awake, asleep AEDs during EEG study: None Technical aspects: This EEG study was done with scalp electrodes positioned according to the 10-20 International system of electrode placement. Electrical activity was acquired at a sampling rate of 500Hz  and reviewed with a high frequency filter of 70Hz  and a low frequency filter of 1Hz . EEG data were recorded continuously and digitally stored. Description: No posterior dominant rhythm was seen.  Sleep was characterized by sleep spindles (12 to 14 Hz), maximal frontocentral region. EEG showed continuous generalized low amplitude  2-3 Hz delta slowing. Hyperventilation and photic stimulation were not performed.   ABNORMALITY -Continuous slow, generalized IMPRESSION: This study is suggestive of moderate to severe diffuse encephalopathy, nonspecific etiology. No seizures or epileptiform discharges were seen throughout the recording. Priyanka O Yadav   [place] EKG SITE RITE  Result Date: 05/23/2020 If [facility] image not attached, placement could not be confirmed due to current cardiac rhythm.   Assessment and Plan:   1. Elevated Troponin/Demand Ischemia  -She has a known history of ischemic cardiomyopathy.  I reviewed her cardiac CTA.  She has two-vessel CAD.  Ejection fraction reduced on last admission. -I reviewed her EKG.  She has sinus tachycardia with nonspecific IVCD.  There is no evidence of ST elevation with reciprocal changes.  She is not in any pain but drowsy.   -This does not represent an acute coronary syndrome. I suspect the troponin elevation is related to metabolic derangements in the setting of known CAD. Would  recommend to continue supportive care for now.  Would hold on heparin.  Continue aspirin. Trend troponins.  -All of her heart failure medications have been stopped.  See discussion below. -We will continue to trend troponins.  2. Ischemic cardiomyopathy, EF 20-25%/hypotension/tachycardia/Cardiogenic shock -She has a known ischemic cardiomyopathy. Her tachycardia is concerning. Lactic acid is normal. She does have evidence of hepatic congestion on a CT abdomen pelvis. Liver enzymes are elevated. She also is hyponatremic. -Hyponatremia has responded to fluids. For now would stop her fluids. -Cooximetry was sent which shows oxygen saturation of 41%.  She is in cardiogenic shock.  Her chest x-ray shows really no congestion.  She does have slight JVD elevation.  BNP is pending. -I had a lengthy discussion with her husband over the phone about the findings tonight.  She has been made DNR/DNI today.  We had discussion that she will not be a candidate for aggressive measures given her debility and recent stroke.  She also has an ischemic cardiomyopathy which is concerning. -We discussed transferring to the unit where we can give IV milrinone overnight.  I think this is reasonable to treat her and get her through the night.  She will then need to have close follow-up with cardiology in the morning for repeat cooximetry.  Her husband has expressed that he would not like heroic or aggressive measures.  He is okay with medications for now.  I think an overall accurate assessment of goals of care with him here in person would be much better than our discussion over the phone.  It is rather difficult to make decisions about pursuing inotropes without him here.  In the interim we will continue with the plan of inotropes tonight and reassess her overall condition tomorrow.  I do fear that she is in a very rough spot with her ischemic cardiomyopathy, EF 20% and now cardiogenic shock.  Given her recent stroke I have my  doubts if she will survive this hospitalization. -When she is transferred to a progressive unit, we will need to check a CVP.  Diuresis will depend on this.  We may just need to let her equilibrate given her recent hyponatremia.  3.  Hyponatremia -This may be related to cardiogenic shock.  BMP pending.  It did improve with fluids.  No further fluids.  4.  Elevated liver enzymes -Secondary to hepatic ingestion and cardiogenic shock.  Continue milrinone as above.  Diuresis pending CVP.  CRITICAL CARE Performed by: [place] Bells T O'Neal  Total critical care time: 45 minutes. Critical care time was exclusive of separately billable procedures and treating other patients. Critical care was necessary to treat or prevent imminent or life-threatening deterioration. Critical care was time spent personally by me on the following activities: development of treatment plan with patient and/or surrogate as well as nursing, discussions with consultants, evaluation of patient's response to treatment, examination of patient, obtaining history from patient or surrogate, ordering and performing treatments and interventions, ordering and review of laboratory studies, ordering and review of radiographic studies, pulse oximetry and re-evaluation of patient's condition.  For questions or updates, please contact [place] Please consult [email or web address] for contact info under   Signed, Lake Bells T. Audie Box, [place]  05/23/2020 7:24 PM

## 2020-05-23 NOTE — Progress Notes (Signed)
Tried contacting DR O'Neal for the transfer order for the administration of Milrinone Lactate to no avail. Per MD, this medication should be administered where patient can be monitored. Tried numerous times to contact MD to no avail. Called [facility] Team to show my concerns, called [facility] also. Will continue  trying to contact MD.

## 2020-05-23 NOTE — Significant Event (Signed)
Rapid Response Event Note   Reason for Call :  Decreased LOC Pt received Ativan 2mg  yesterday (2/23) at 1517. Per report, primary RN was informed that pt was drowsy throughout the night. MD was notified and ABG and Romazicon IV was ordered.  ABG 7.52/ 22.1/ 165/ 18.1  Additional MD orders: CT head, various blood labs, UDS, EEG, Narcan x1  Initial Focused Assessment:  Pt lying in bed, resting. Opens eyes to voice-nonsustained. Requiring voice and physical stimuli to respond to orientation questions. She tells me her name, age, where she is. She is disoriented to month and situation. Lung sounds are clear, no distress. Heart rate is regular, sinus tachycardia. STE noted on telemetry, EKG completed. Skin is cool, dry, pale. Following commands and moving all extremities. Face symmetrical. PERRLA, 51mm. Pt noted to have minimal urine output.   VS: T 98, BP 96/75, HR 108, RR 14, SpO2 99% on room air  No remarkable change in pt status following administration of Romazicon and Narcan.  Interventions:  No intervention from RRRN See new orders from MD  Plan of Care:  -Follow up with provider results of tests ordered -Frequent neuro assessments  Call rapid response for additional needs.  Event Summary:  MD Notified: Dr. 1m Call Time: [phone] Arrival Time: 0945 End Time: 1020  1540, RN

## 2020-05-23 NOTE — Progress Notes (Signed)
EEG completed, results pending. 

## 2020-05-23 NOTE — Progress Notes (Signed)
PT Cancellation Note  Patient Details Name: Madison Hurst MRN: [id number] DOB: 1961/03/09   Cancelled Treatment:    Reason Eval/Treat Not Completed: Medical issues which prohibited therapy Patient with hypersomnolence. Rapid response called this morning. Patient transferred to higher level of care. PT will re-attempt.   Sadi Arave A. Dan Humphreys PT, DPT Acute Rehabilitation Services Pager [phone] Office [phone]    Viviann Spare 05/23/2020, 2:54 PM

## 2020-05-23 NOTE — Procedures (Signed)
Patient Name: Madison Hurst  MRN: [id number]  Epilepsy Attending: Charlsie Quest  Referring Physician/Provider: Dr. Raphael Gibney Date: 05/23/2020 Duration: 24.13 mins  Patient history: 60 year old female with altered mental status.  EEG to evaluate for seizures.  Level of alertness: Awake, asleep  AEDs during EEG study: None  Technical aspects: This EEG study was done with scalp electrodes positioned according to the 10-20 International system of electrode placement. Electrical activity was acquired at a sampling rate of 500Hz  and reviewed with a high frequency filter of 70Hz  and a low frequency filter of 1Hz . EEG data were recorded continuously and digitally stored.   Description: No posterior dominant rhythm was seen.  Sleep was characterized by sleep spindles (12 to 14 Hz), maximal frontocentral region. EEG showed continuous generalized low amplitude  2-3 Hz delta slowing. Hyperventilation and photic stimulation were not performed.     ABNORMALITY -Continuous slow, generalized  IMPRESSION: This study is suggestive of moderate to severe diffuse encephalopathy, nonspecific etiology. No seizures or epileptiform discharges were seen throughout the recording.  Madison Hurst 

## 2020-05-23 NOTE — Progress Notes (Signed)
Initial Nutrition Assessment  DOCUMENTATION CODES:  Non-severe (moderate) malnutrition in context of chronic illness  INTERVENTION:  Please obtain updated weight when possible.  Ensure Enlive po BID, each supplement provides 350 kcal and 20 grams of protein.  May need to consider nutrition support soon if patient is not awake/oriented and alert enough to eat PO.  Continue MVI with minerals daily.  NUTRITION DIAGNOSIS:  Moderate Malnutrition related to acute illness (stroke) as evidenced by moderate muscle depletion,mild fat depletion.  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   Per MD note, husband reports poor PO intake recently. Per Epic documentation, pt had 50% of lunch yesterday. Pt was extremely sleepy and only responded to a sternal rub for visit. Spoke with nurse who was taking vitals at the time. She reports that her sleepiness might be from a bad reaction the Ativan she was given last night. She was given an antidote but is still sleepy and has been like this since.  Wt hx in Epic seems inconsistent. Wt taken yesterday does not seem to match build of patient. Pt appears to be closer to 1/25 wt of 58.4 kg and will be using this for needs.  Relevant Medications: folic acid 2 mg, Novolog, MVI with minerals, Protonix, prednisone Labs: reviewed; Na 124, Glucose 108  NUTRITION - FOCUSED PHYSICAL EXAM: Flowsheet Row Most Recent Value  Orbital Region No depletion  Upper Arm Region Mild depletion  Thoracic and Lumbar Region Mild depletion  Buccal Region No depletion  Temple Region Mild depletion   Clavicle Bone Region Mild depletion  Clavicle and Acromion Bone Region Mild depletion  Scapular Bone Region Unable to assess  Dorsal Hand No depletion  Patellar Region Mild depletion  Anterior Thigh Region Moderate depletion  Posterior Calf Region Moderate depletion  Edema (RD Assessment) Mild  Hair Reviewed  Eyes Reviewed  Mouth Unable to assess  Skin Reviewed  [Clusters of small, several red scabs on hands and knees]  Nails Reviewed     Diet Order:   Diet Order            Diet Carb Modified Fluid consistency: Thin; Room service appropriate? Yes  Diet effective now                EDUCATION NEEDS:  Not appropriate for education at this time  Skin:  Skin Assessment: Reviewed RN Assessment  Last BM:  05/07/2020  Height:  Ht Readings from Last 1 Encounters:  05/22/20 5\' 5"  (1.651 m)   Weight:  Wt Readings from Last 1 Encounters:  05/22/20 94.5 kg   Ideal Body Weight: 56.8 kg  BMI: Body mass index is 34.68 kg/m.  Estimated Nutritional Needs:  Kcal: 1700-1900 Protein: 85-100 grams Fluid: >1.7 L  , RD, LDN Registered Dietitian After Hours/Weekend Pager # in [place]

## 2020-05-23 NOTE — Progress Notes (Signed)
Dr Flora Lipps ordered Cooxemetry panel,per MD, result showed that patient in Cardiogenic shock. Milrinon(primacor) ordered, hence patient to be transferred to appropriate unit.

## 2020-05-23 NOTE — Progress Notes (Signed)
Peripherally Inserted Central Catheter Placement  The IV Nurse has discussed with the patient and/or persons authorized to consent for the patient, the purpose of this procedure and the potential benefits and risks involved with this procedure.  The benefits include less needle sticks, lab draws from the catheter, and the patient may be discharged home with the catheter. Risks include, but not limited to, infection, bleeding, blood clot (thrombus formation), and puncture of an artery; nerve damage and irregular heartbeat and possibility to perform a PICC exchange if needed/ordered by physician.  Alternatives to this procedure were also discussed.  Bard Power PICC patient education guide, fact sheet on infection prevention and patient information card has been provided to patient /or left at bedside.  Husban signed consent  PICC Placement Documentation  PICC Double Lumen 05/23/20 PICC Left Brachial 35 cm 0 cm (Active)  Indication for Insertion or Continuance of Line Vasoactive infusions 05/23/20 1554  Exposed Catheter (cm) 0 cm 05/23/20 1554  Site Assessment Clean;Dry;Intact 05/23/20 1554  Lumen #1 Status Flushed;Saline locked;Blood return noted 05/23/20 1554  Lumen #2 Status Flushed;Saline locked;Blood return noted 05/23/20 1554  Dressing Type Transparent 05/23/20 1554  Dressing Status Dry;Clean;Intact 05/23/20 1554  Antimicrobial disc in place? Yes 05/23/20 1554  Dressing Intervention New dressing 05/23/20 1554  Dressing Change Due 05/29/20 05/23/20 1554       Ethelda Chick 05/23/2020, 3:56 PM

## 2020-05-23 NOTE — Progress Notes (Signed)
   05/23/20 0932  Assess: MEWS Score  Temp 98 F (36.7 C)  BP 96/75  Pulse Rate (!) 108  Resp 14  Level of Consciousness Responds to Pain  Assess: MEWS Score  MEWS Temp 0  MEWS Systolic 1  MEWS Pulse 1  MEWS RR 0  MEWS LOC 2  MEWS Score 4  MEWS Score Color Red  Escalate  MEWS: Escalate Red: discuss with charge nurse/RN and provider, consider discussing with RRT  Notify: Charge Nurse/RN  Name of Charge Nurse/RN Notified Park Meo  Date Charge Nurse/RN Notified 05/23/20  Time Charge Nurse/RN Notified 0935  Notify: Provider  Provider Name/Title Dr Dow Adolph  Date Provider Notified 05/23/20  Time Provider Notified 0935  Notification Type Page  Notification Reason Change in status  Provider response See new orders  Date of Provider Response 05/23/20  Time of Provider Response 0940  Notify: Rapid Response  Name of Rapid Response RN Notified Rutha Bouchard  Date Rapid Response Notified 05/23/20  Time Rapid Response Notified 0944  Document  Patient Outcome Transferred/level of care increased  Progress note created (see row info) Yes

## 2020-05-23 NOTE — Progress Notes (Signed)
RAPID Team assessed patient. RAPID Team Administered Milrinone lactate. Will continue monitoring patient.

## 2020-05-23 NOTE — Consult Note (Addendum)
NEURO HOSPITALIST CONSULT NOTE   Requestig physician: Dr. Dow Adolph   Reason for Consult:Acute encephalopathy   History obtained from:  Chart review  HPI:                                                                                                                                          Madison Hurst is an 60 y.o. female with PMHx of paroxysmal atrial tachycardia, coronary artery disease, hypothyroidism, hyperlipidemia, hypertension, diabetes mellitus, seropositive rheumatoid arthritis on prednisone, anxiety, depression, iron deficiency anemia, R MCA Stroke (04/22/20), cervical spinal stenosis, recently discharged on 05/16/2020 from SNF.  She presented to [facility] ED due to urinary retention for 24 hours on 05/22/2020.  In the ED her Na 120, 500 cc on bladder scan and Foley catheter was put in.  CT abdomen/pelvis were negative for any acute findings and husband reported poor oral intake recently. Today patient developed progressive somnolence.  She received Ativan 2 mg prior to CT abdomen/pelvis on 2/23 and was given Romazicon without any improvement.  Neurology is consulted for patient's acute encephalopathy.  EEG was done earlier today which is suggestive of moderate to severe diffuse encephalopathy of nonspecific etiology but no seizure or epileptiform discharges were seen.  Patient has had a chronic decline over at least a year with increasing fatigue, dizzy spells.  She presented ~2 weeks later after developing left-sided weakness for evaluation in 04/22/2020.  Regarding her rheumatoid arthritis she is on Orencia and prednisone 5 mg daily, stable regimen for past 3 years.  Past Surgical History:  Procedure Laterality Date  . BUBBLE STUDY  04/24/2020   Procedure: BUBBLE STUDY;  Surgeon: Meriam Sprague, MD;  Location: [facility] ENDOSCOPY;  Service: Cardiovascular;;  . CESAREAN SECTION    . CHOLECYSTECTOMY    . other     Growth removal on chest  . TEE WITHOUT  CARDIOVERSION N/A 04/24/2020   Procedure: TRANSESOPHAGEAL ECHOCARDIOGRAM (TEE);  Surgeon: Meriam Sprague, MD;  Location: [facility] ENDOSCOPY;  Service: Cardiovascular;  Laterality: N/A;  . TOTAL SHOULDER ARTHROPLASTY Left     Family History  Problem Relation Age of Onset  . Muscular dystrophy Mother   . Diabetes Father   . Diabetes Brother   . Heart disease Brother         Social History:  reports that she quit smoking about 26 years ago. She has never used smokeless tobacco. She reports that she does not drink alcohol. No history on file for drug use.  MEDICATIONS:  Current Outpatient Medications  Medication Instructions  . acetaminophen (TYLENOL) 650 mg, Oral, Every 6 hours PRN  . aspirin EC 81 mg, Oral, Daily, Swallow whole.  Marland Kitchen buPROPion (WELLBUTRIN XL) 150 mg, Oral, Daily  . buPROPion (WELLBUTRIN) 100 mg, Oral, Daily  . diclofenac Sodium (VOLTAREN) 2 g, Topical, 4 times daily  . DULoxetine (CYMBALTA) 30 mg, Oral, Daily  . ergocalciferol (VITAMIN D2) 50,000 Units, Oral, Weekly  . folic acid (FOLVITE) 2 mg, Oral, Daily  . levothyroxine (SYNTHROID) 75 mcg, Oral, Daily before breakfast  . melatonin 5 mg, Oral, Daily at bedtime  . metFORMIN (GLUCOPHAGE) 500 mg, Oral, 2 times daily with meals  . Multiple Vitamin (MULTIVITAMIN) capsule 1 capsule, Oral, Daily  . pantoprazole (PROTONIX) 40 mg, Oral, Daily  . predniSONE (DELTASONE) 5 mg, Oral, Daily     ROS:                                                                                                                                       Unable to obtain due to patient's altered mental status.   Blood pressure 96/68, pulse (!) 103, temperature 97.6 F (36.4 C), temperature source Oral, resp. rate 19, height 5\' 5"  (1.651 m), weight 94.5 kg, SpO2 98 %.  General Examination:                                                                                                       Physical Exam  General: Well developed, well nourished, NAD Cardiovascular: Regular rate and rhythm. Respiratory: Clear to auscultation, no wheezes or rales Abdomen: Soft, nontender, nondistended Musculoskeletal: No edema Skin: Bilateral lower extremity rash Neurological Examination Mental Status: Patient is obtunded to stuporous. Opens eyes to loud noises and name calling.  Able to state her name and age but does not answer any other questions.  Able to follow simple midline and peripheral commands with moderate difficulty. Cranial Nerves: Pupils equal, round, reactive to light and accommodation, no ptosis.  Facial movements symmetric and symmetric to sensation.  Hearing is intact to voice.  Able to partially protrude her tongue out, looks midline without atrophy or fasciculation.  Motor: Able to move all 4 extremities on commands, antigravity at least 3/5 in all extremities.  Not able to move against resistance.  Tone is increased in upper extremities.  Bulk is normal Sensory: Light touch intact throughout, bilaterally Deep Tendon Reflexes: 3+ and symmetric throughout Plantars: Toes are downgoing bilaterally Cerebellar:normal finger-to-nose on left side but unable to  do on right. Gait: Deferred   Lab Results: Basic Metabolic Panel: Recent Labs  Lab 06/10/20 2016 05/22/20 0559 05/22/20 1918 05/23/20 0125 05/23/20 1359  NA 120* 121* 119* 124* 126*  K 4.4 4.9  --  3.5 4.4  CL 85* 88*  --  93* 95*  CO2 19* 18*  --  18* 17*  GLUCOSE 142* 114*  --  132* 130*  BUN 12 11  --  11 11  CREATININE 0.70 0.73  --  0.59 0.62  CALCIUM 8.0* 7.8*  --  7.7* 7.6*   CBC: Recent Labs  Lab 2020-06-10 2016 05/23/20 1359  WBC 12.9* 13.0*  NEUTROABS 11.1* 11.2*  HGB 11.3* 11.2*  HCT 36.2 36.1  MCV 107.4* 108.1*  PLT 352 300   Imaging: MR Cervical spine wo contrast 05/22/2020 IMPRESSION: 1. Abbreviated and motion degraded study. This is the  third attempted exam over the last month and it would seem that the patient is not able to tolerate MRI without sedation. 2. Degenerative spondylosis from C3-4 through C6-7. No compressive canal or foraminal stenosis suspected. Detail remains quite poor.  CT Head wo contrast 05/23/2020 IMPRESSION: No acute abnormality no interval change from prior study. Atrophy and chronic white matter changes.  Assessment: 60 year old presented with urinary retention on 10-Jun-2020 and found to be progressively encephalopathic today.  Recently discharged from SNF on 05/16/2020 to home.  The patient's confusion, inattention mental status suggestive of acute encephalopathy perhaps due to metabolic derangement. -On examination patient is obtunded to stuporous, opens eyes to loud noise and name calling.  Able to follow simple midline and peripheral commands.  Moves all 4 extremities on command, antigravity, not against resistance.  Brisk reflexes overall. -Ammonia 38, WBC 13, sodium 124, calcium 7.7, corrected 8.7.  TSH 0.740 -CT head today does not show any acute abnormality.  Atrophic and chronic white matter changes. -EEG today is suggestive of moderate to severe diffuse encephalopathy, nonspecific etiology.  No seizures or epileptiform discharges are seen throughout the recording. -PCCM is on board to assist with the management of this patient.  Unable to draw blood, PICC placement today. -Alternative explanation for the symptoms could include subclinical seizures (ruled out by EEG) or stroke which is less likely given her presentation.  No signs of meningitis/encephalitis.  CT head does not show any acute abnormalities  Other considerations-new stroke.  Further imaging will be helpful.   Encephalopathy secondary to multifactorial toxic metabolic derangements.  Even though improvement has started to occur in sodium levels, the mental status and brain function lag lab improvements.  Impression: -Acute  encephalopathy most likely due to metabolic derangement. -Hypovolemic hyponatremia -Anion gap metabolic acidosis with respiratory overcompensation -Leukocytosis, unclear etiology -Chronic physical debility -Rule out stroke  Recommendations: -Continue treatment for metabolic derangement . -Follow up CBC with diff, Na, Mg, Phosphorus. -MRI when patient is more stable.  -Try to minimize deliriogenic medications as much as possible (J Am Geriatr Soc. 2012 Apr;60(4):616-31): benzodiazepines, anticholinergics, diphenhydramine, antihistamines, narcotics, Ambien/Lunesta/Sonata etc.   Madison Lenis, MD PGY-1, Resident   05/23/2020, 4:50 PM    Attending addendum Patient seen and examined Imaging independently reviewed Agree with history and physical documented above Subacute decline with more clear decline after the stroke in January. More somnolent today than yesterday. Severe RA at baseline. On my examination, extremely somnolent, opens eyes to voice, keeps falling asleep during this encounter.  Left hemiparesis.  Mild dysarthria. CT head with no acute changes EEG with moderate to severe encephalopathy-unclear etiology  --  Madison Portland, MD Neurologist [facility] Pager: [phone]

## 2020-05-23 NOTE — Progress Notes (Signed)
Patient is lethargic, otherwise, alert to person, place, time, Got a call from DR. O'nelli,stating that patient is in a cardiogenic shock and would have to be transferred ,to another unit. Per MD, husband has been informed.

## 2020-05-23 NOTE — Progress Notes (Addendum)
PROGRESS NOTE  Madison Hurst AVW:[id number] DOB: Dec 08, 1960 DOA: 05/12/2020 PCP: Buckner Malta, MD  HPI/Recap of past 24 hours: Madison Hurst a 60 y.o.femalewith medical history significant ofR MCA stroke with L sided weakness and aphasia on 1/24, also has RA on prednisone, DM2, HTN, paroxysmal atrial tachycardia and PACs, cervical spinal stenosis. Recently discharged on 05/16/2020 from inpatient rehab at [facility], from there she was discharged to home.  She returns with complaints of inability to urinate for the past 24 hours.  Work-up revealed acute urinary retention for which a Foley catheter was inserted and hyponatremia with presenting serum sodium of 120.  She had a CT abdomen and pelvis which was done and was negative for any acute findings.  She had an MRI of her cervical spine done twice due to abbreviated and motion degraded study which did not reveal compressive canal or foraminal stenosis.  Hospital course complicated by lethargy worse on 05/23/2020 for which there is ongoing work-up.  ABG 05/23/20 revealed respiratory alkalosis.  Given flumazenil and Narcan.  Stat CT head and EEG ordered.  05/23/20: Very lethargic, arouses with sternal rubs then drifts back to sleep.  Twelve-lead EKG abnormal showing ST elevation involving V1 V2 and V3.  Cardiology consulted.  Stat troponins ordered.  Addendum: Unable to draw blood, will obtain a PICC line.  EEG negative for seizure activity.  Due to persistent altered mental status we will transfer to progressive unit and involve critical care medicine.    Assessment/Plan: Principal Problem:   Hyponatremia Active Problems:   PAT (paroxysmal atrial tachycardia) (HCC)   Chronic combined systolic and diastolic CHF (congestive heart failure) (HCC)   Rheumatoid arthritis involving multiple sites [facility])   Right middle cerebral artery stroke (HCC)   Benign essential HTN   Diabetes mellitus type 2 in nonobese  [facility])   Acute urinary retention   Lesion of pancreas   Pulmonary nodule  Acute metabolic encephalopathy, unclear etiology Became very lethargic on 05/23/20 AM Ongoing work-up.  ABG 05/23/20 revealed respiratory alkalosis.  Given flumazenil and Narcan.  Stat ammonia, stat CT head and EEG ordered.  Follow results. Aspiration precautions. N.p.o. until lethargy has resolved  Abnormal EKG Twelve-lead EKG abnormal showing ST elevation involving V1 V2 and V3.  Cardiology consulted.  Stat troponins ordered.  Hypovolemic hyponatremia, improving Presented with serum sodium 120 Improving 124 this morning.  Repeat level. Check serum sodium every 6 hours. Avoid rapid correction no more than 8 mEq per 24H.  Anion gap metabolic acidosis Serum bicarb 18, anion gap 13 Obtain lactic acid level  Electrolytes arrangement: Hypocalcemia, corrected calcium for albumin 8.7, repleted intravenously with 1 g calcium gluconate x1 dose. Hypokalemia, repleted intravenously as the patient is n.p.o. Received a dose of IV magnesium 2 g x 1.  Ischemic cardiomyopathy with LVEF 20 to 25% Last 2D echo done on 04/24/2020 revealed LVEF 20 to 25% with left ventricle global hypokinesis. A small PFO was detected. Continue strict I's and O's and daily weight.  History of cervical spinal stenosis he had an MRI of her cervical spine done twice due to abbreviated and motion degraded study which did not reveal compressive canal or foraminal stenosis.  Leukocytosis, unclear etiology. Possibly reactive in the setting of steroid use from rheumatoid arthritis. Repeat labs this morning and follow results. UA was negative on admission Lung exam is benign with O2 saturation 100% on room air.  QTC prolongation QTC greater than 520 on twelve-lead EKG Avoid QTC prolonging agents  Optimize magnesium and potassium levels Repeat twelve-lead EKG on 05/13/2020.  Hypothyroidism Obtain TSH Continue home levothyroxine.  Chronic  anxiety/depression Hold off bupropion until EEG is completed to rule out subclinical seizures in the setting of lethargy  GERD Resume PPI  Physical debility PT OT to assess once stable Continue fall precautions.    Code Status: Full code.  Family Communication: None at bedside.  Disposition Plan: Likely will discharge to home with home health services.   Consultants:  Cardiology.  Procedures:  None.  Antimicrobials:  None.  DVT prophylaxis: Subcu Lovenox daily  Status is: Inpatient   Dispo:  Patient From: Home  Planned Disposition: Skilled Nursing Facility  Expected discharge date: 05/27/2020  Medically stable for discharge: No, ongoing management of acute metabolic encephalopathy.          Objective: Vitals:   05/23/20 1000 05/23/20 1035 05/23/20 1129 05/23/20 1222  BP: 103/70 (!) 89/58 107/69 101/79  Pulse: (!) 105 (!) 103 99   Resp: Temp:  97.8 F (36.6 C) 98.2 F (36.8 C) 98.2 F (36.8 C)  TempSrc:  Axillary Axillary Axillary  SpO2:  100% 100% 100%  Weight:      Height:        Intake/Output Summary (Last 24 hours) at 05/23/2020 1247 Last data filed at 05/23/2020 0600 Gross per 24 hour  Intake 1509.86 ml  Output 975 ml  Net 534.86 ml   Filed Weights   05/22/20 1300  Weight: 94.5 kg    Exam:  . General: 60 y.o. year-old female well developed well nourished in no acute distress.  Lethargic awakens with sternal rub and drifts back to sleep. . Cardiovascular: Regular rate and rhythm with no rubs or gallops.  No thyromegaly or JVD noted.   Marland Kitchen Respiratory: Clear to auscultation with no wheezes or rales. Good inspiratory effort. . Abdomen: Soft nontender nondistended with normal bowel sounds x4 quadrants. . Musculoskeletal: No lower extremity edema. 2/4 pulses in all 4 extremities. . Skin: Bilateral lower extremity rash, POA. Marland Kitchen Psychiatry: Mood is appropriate for condition and setting   Data Reviewed: CBC: Recent Labs   Lab 06-04-20 2016  WBC 12.9*  NEUTROABS 11.1*  HGB 11.3*  HCT 36.2  MCV 107.4*  PLT 352   Basic Metabolic Panel: Recent Labs  Lab Jun 04, 2020 2016 05/22/20 0559 05/22/20 1918 05/23/20 0125  NA 120* 121* 119* 124*  K 4.4 4.9  --  3.5  CL 85* 88*  --  93*  CO2 19* 18*  --  18*  GLUCOSE 142* 114*  --  132*  BUN 12 11  --  11  CREATININE 0.70 0.73  --  0.59  CALCIUM 8.0* 7.8*  --  7.7*   GFR: Estimated Creatinine Clearance: 88.2 mL/min (by C-G formula based on SCr of 0.59 mg/dL). Liver Function Tests: No results for input(s): AST, ALT, ALKPHOS, BILITOT, PROT, ALBUMIN in the last 168 hours. No results for input(s): LIPASE, AMYLASE in the last 168 hours. No results for input(s): AMMONIA in the last 168 hours. Coagulation Profile: No results for input(s): INR, PROTIME in the last 168 hours. Cardiac Enzymes: No results for input(s): CKTOTAL, CKMB, CKMBINDEX, TROPONINI in the last 168 hours. BNP (last 3 results) No results for input(s): PROBNP in the last 8760 hours. HbA1C: No results for input(s): HGBA1C in the last 72 hours. CBG: Recent Labs  Lab 05/22/20 1653 05/22/20 2037 05/22/20 2130 05/23/20 0734 05/23/20 1207  GLUCAP 178* 206* 154* 108* 124*  Lipid Profile: No results for input(s): CHOL, HDL, LDLCALC, TRIG, CHOLHDL, LDLDIRECT in the last 72 hours. Thyroid Function Tests: No results for input(s): TSH, T4TOTAL, FREET4, T3FREE, THYROIDAB in the last 72 hours. Anemia Panel: No results for input(s): VITAMINB12, FOLATE, FERRITIN, TIBC, IRON, RETICCTPCT in the last 72 hours. Urine analysis:    Component Value Date/Time   COLORURINE AMBER (A) 05/10/2020 1715   APPEARANCEUR CLEAR 05/25/2020 1715   LABSPEC 1.018 05/27/2020 1715   PHURINE 5.0 05/08/2020 1715   GLUCOSEU NEGATIVE 05/01/2020 1715   HGBUR NEGATIVE 05/14/2020 1715   BILIRUBINUR NEGATIVE 05/10/2020 1715   KETONESUR NEGATIVE 05/22/2020 1715   PROTEINUR NEGATIVE 05/20/2020 1715   NITRITE NEGATIVE  05/25/2020 1715   LEUKOCYTESUR NEGATIVE 05/03/2020 1715   Sepsis Labs: @LABRCNTIP (procalcitonin:4,lacticidven:4)  ) Recent Results (from the past 240 hour(s))  Resp Panel by RT-PCR (Flu A&B, Covid) Nasopharyngeal Swab     Status: None   Collection Time: 05/17/2020 10:19 PM   Specimen: Nasopharyngeal Swab; Nasopharyngeal(NP) swabs in vial transport medium  Result Value Ref Range Status   SARS Coronavirus 2 by RT PCR NEGATIVE NEGATIVE Final    Comment: (NOTE) SARS-CoV-2 target nucleic acids are NOT DETECTED.  The SARS-CoV-2 RNA is generally detectable in upper respiratory specimens during the acute phase of infection. The lowest concentration of SARS-CoV-2 viral copies this assay can detect is 138 copies/mL. A negative result does not preclude SARS-Cov-2 infection and should not be used as the sole basis for treatment or other patient management decisions. A negative result may occur with  improper specimen collection/handling, submission of specimen other than nasopharyngeal swab, presence of viral mutation(s) within the areas targeted by this assay, and inadequate number of viral copies(<138 copies/mL). A negative result must be combined with clinical observations, patient history, and epidemiological information. The expected result is Negative.  Fact Sheet for Patients:  05/23/20  Fact Sheet for Healthcare Providers:  BloggerCourse.com  This test is no t yet approved or cleared by the [employer] FDA and  has been authorized for detection and/or diagnosis of SARS-CoV-2 by FDA under an Emergency Use Authorization (EUA). This EUA will remain  in effect (meaning this test can be used) for the duration of the COVID-19 declaration under Section 564(b)(1) of the Act, 21 U.S.C.section 360bbb-3(b)(1), unless the authorization is terminated  or revoked sooner.       Influenza A by PCR NEGATIVE NEGATIVE Final   Influenza B  by PCR NEGATIVE NEGATIVE Final    Comment: (NOTE) The Xpert Xpress SARS-CoV-2/FLU/RSV plus assay is intended as an aid in the diagnosis of influenza from Nasopharyngeal swab specimens and should not be used as a sole basis for treatment. Nasal washings and aspirates are unacceptable for Xpert Xpress SARS-CoV-2/FLU/RSV testing.  Fact Sheet for Patients: [place]  Fact Sheet for Healthcare Providers: BloggerCourse.com  This test is not yet approved or cleared by the [employer].[place] FDA and has been authorized for detection and/or diagnosis of SARS-CoV-2 by FDA under an Emergency Use Authorization (EUA). This EUA will remain in effect (meaning this test can be used) for the duration of the COVID-19 declaration under Section 564(b)(1) of the Act, 21 U.S.C. section 360bbb-3(b)(1), unless the authorization is terminated or revoked.  Performed at [facility], [address] [place], [place]       Studies: CT HEAD WO CONTRAST  Result Date: 05/23/2020 CLINICAL DATA:  Mental status change EXAM: CT HEAD WITHOUT CONTRAST TECHNIQUE: Contiguous axial images were obtained from the base  of the skull through the vertex without intravenous contrast. COMPARISON:  CT head 05/07/2020 FINDINGS: Brain: Moderate atrophy. Chronic white matter changes, stable from the prior study. Negative for acute infarct, hemorrhage, mass. Vascular: Negative for hyperdense vessel Skull: Negative Sinuses/Orbits: Paranasal sinuses clear.  No orbital lesion Other: None IMPRESSION: No acute abnormality no interval change from prior study. Atrophy and chronic white matter changes. Electronically Signed   By: Marlan Palau M.D.   On: 05/23/2020 10:42   MR CERVICAL SPINE WO CONTRAST  Result Date: 05/22/2020 CLINICAL DATA:  Acute presentation with inability to urinate. Poor quality study done earlier today secondary to motion degradation. EXAM: MRI  CERVICAL SPINE WITHOUT CONTRAST TECHNIQUE: Multiplanar, multisequence MR imaging of the cervical spine was performed. No intravenous contrast was administered. COMPARISON:  Earlier same day.  04/23/2020. FINDINGS: Yet again, the study is markedly degraded by motion. This is the third attempted exam over the last month and it would seem that the patient is not able to tolerate MRI without sedation. Alignment: No malalignment. Vertebrae: No evidence of fracture or primary bone lesion. Cord: Cord detail quite limited. No visible cord compression or primary cord lesion. Posterior Fossa, vertebral arteries, paraspinal tissues: Negative Disc levels: Degenerative spondylosis from C3-4 through C6-7. Osteophytic narrowing of the canal and foramina without detection of severe stenosis. IMPRESSION: 1. Abbreviated and motion degraded study. This is the third attempted exam over the last month and it would seem that the patient is not able to tolerate MRI without sedation. 2. Degenerative spondylosis from C3-4 through C6-7. No compressive canal or foraminal stenosis suspected. Detail remains quite poor. Electronically Signed   By: Paulina Fusi M.D.   On: 05/22/2020 16:33   EEG adult  Result Date: 05/23/2020 Charlsie Quest, MD     05/23/2020 12:16 PM Patient Name: Toccara Alford MRN: [id number] Epilepsy Attending: Charlsie Quest Referring Physician/Provider: Dr. Raphael Gibney Date: 05/23/2020 Duration: 24.13 mins Patient history: 60 year old female with altered mental status.  EEG to evaluate for seizures. Level of alertness: Awake, asleep AEDs during EEG study: None Technical aspects: This EEG study was done with scalp electrodes positioned according to the 10-20 International system of electrode placement. Electrical activity was acquired at a sampling rate of 500Hz  and reviewed with a high frequency filter of 70Hz  and a low frequency filter of 1Hz . EEG data were recorded continuously and digitally stored.  Description: No posterior dominant rhythm was seen.  Sleep was characterized by sleep spindles (12 to 14 Hz), maximal frontocentral region. EEG showed continuous generalized low amplitude  2-3 Hz delta slowing. Hyperventilation and photic stimulation were not performed.   ABNORMALITY -Continuous slow, generalized IMPRESSION: This study is suggestive of moderate to severe diffuse encephalopathy, nonspecific etiology. No seizures or epileptiform discharges were seen throughout the recording.   EKG SITE RITE  Result Date: 05/23/2020 If [facility] image not attached, placement could not be confirmed due to current cardiac rhythm.   Scheduled Meds: . aspirin EC  81 mg Oral Daily  . Chlorhexidine Gluconate Cloth  6 each Topical Daily  . diclofenac Sodium  2 g Topical QID  . DULoxetine  30 mg Oral Daily  . enoxaparin (LOVENOX) injection  30 mg Subcutaneous Q24H  . feeding supplement  237 mL Oral BID BM  . folic acid  2 mg Oral Daily  . insulin aspart  0-9 Units Subcutaneous TID WC  . [START ON 05/16/2020] levothyroxine  37.5 mcg Intravenous Daily  .  melatonin  5 mg Oral QHS  . methylPREDNISolone (SOLU-MEDROL) injection  40 mg Intravenous Daily  . multivitamin with minerals  1 tablet Oral Daily  . naloxone      . pantoprazole (PROTONIX) IV  40 mg Intravenous Q24H    Continuous Infusions: . sodium chloride 50 mL/hr at 05/23/20 0749  . calcium gluconate    . magnesium sulfate bolus IVPB    . potassium chloride       LOS: 1 day     Darlin Drop, MD [facility] Pager [phone]  If 7PM-7AM, please contact night-coverage [email or web address] Password [facility] 05/23/2020, 12:47 PM

## 2020-05-23 NOTE — Progress Notes (Signed)
OT Cancellation Note  Patient Details Name: Madison Hurst MRN: [id number] DOB: 10-31-1960   Cancelled Treatment:    Reason Eval/Treat Not Completed: Medical issues which prohibited therapy. Pt with hypersomnolence. Rapid response called this morning. Pt transferred to higher level of care. Will follow.   Evern Bio 05/23/2020, 2:32 PM  Martie Round, OTR/L Acute Rehabilitation Services Pager: [phone] Office: [phone]

## 2020-05-23 NOTE — Progress Notes (Signed)
Paged [facility] and notified of patient's condition. Awaiting response from [facility].

## 2020-05-23 NOTE — Progress Notes (Signed)
Returned to re-assess the patient.  Still hyper somnolent.  Her husband is in the room.  Spoke with him this morning via phone.  All questions answered to the best of my ability.  PCCM has been consulted to assist with the management.  Patient is being transferred to stepdown unit, higher level of care.  The patient's husband changed her code status to DNR.

## 2020-05-24 ENCOUNTER — Inpatient Hospital Stay (HOSPITAL_COMMUNITY): Payer: Medicare PPO

## 2020-05-24 DIAGNOSIS — Z7189 Other specified counseling: Secondary | ICD-10-CM | POA: Diagnosis not present

## 2020-05-24 DIAGNOSIS — I5042 Chronic combined systolic (congestive) and diastolic (congestive) heart failure: Secondary | ICD-10-CM

## 2020-05-24 DIAGNOSIS — Z515 Encounter for palliative care: Secondary | ICD-10-CM

## 2020-05-24 DIAGNOSIS — Z66 Do not resuscitate: Secondary | ICD-10-CM | POA: Diagnosis not present

## 2020-05-24 DIAGNOSIS — E871 Hypo-osmolality and hyponatremia: Secondary | ICD-10-CM | POA: Diagnosis not present

## 2020-05-24 DIAGNOSIS — L8990 Pressure ulcer of unspecified site, unspecified stage: Secondary | ICD-10-CM | POA: Insufficient documentation

## 2020-05-24 DIAGNOSIS — R570 Cardiogenic shock: Secondary | ICD-10-CM

## 2020-05-24 DIAGNOSIS — I69354 Hemiplegia and hemiparesis following cerebral infarction affecting left non-dominant side: Secondary | ICD-10-CM | POA: Diagnosis not present

## 2020-05-24 LAB — COMPREHENSIVE METABOLIC PANEL
ALT: 271 U/L — ABNORMAL HIGH (ref 0–44)
AST: 139 U/L — ABNORMAL HIGH (ref 15–41)
Albumin: 2 g/dL — ABNORMAL LOW (ref 3.5–5.0)
Alkaline Phosphatase: 207 U/L — ABNORMAL HIGH (ref 38–126)
BUN: 12 mg/dL (ref 6–20)
CO2: <7 mmol/L — ABNORMAL LOW (ref 22–32)
Calcium: 8 mg/dL — ABNORMAL LOW (ref 8.9–10.3)
Chloride: 96 mmol/L — ABNORMAL LOW (ref 98–111)
Creatinine, Ser: 0.98 mg/dL (ref 0.44–1.00)
GFR, Estimated: >60 mL/min (ref >60)
Glucose, Bld: 188 mg/dL — ABNORMAL HIGH (ref 70–99)
Potassium: 6 mmol/L — ABNORMAL HIGH (ref 3.5–5.1)
Sodium: 128 mmol/L — ABNORMAL LOW (ref 135–145)
Total Bilirubin: 1.8 mg/dL — ABNORMAL HIGH (ref 0.3–1.2)
Total Protein: 4.6 g/dL — ABNORMAL LOW (ref 6.5–8.1)

## 2020-05-24 LAB — CBC WITH DIFFERENTIAL/PLATELET
Abs Immature Granulocytes: 0.5 10*3/uL — ABNORMAL HIGH (ref 0.00–0.07)
Basophils Absolute: 0.1 10*3/uL (ref 0.0–0.1)
Basophils Relative: 0 %
Eosinophils Absolute: 0 10*3/uL (ref 0.0–0.5)
Eosinophils Relative: 0 %
HCT: 33.6 % — ABNORMAL LOW (ref 36.0–46.0)
Hemoglobin: 9.7 g/dL — ABNORMAL LOW (ref 12.0–15.0)
Immature Granulocytes: 2 %
Lymphocytes Relative: 2 %
Lymphs Abs: 0.4 10*3/uL — ABNORMAL LOW (ref 0.7–4.0)
MCH: 34.2 pg — ABNORMAL HIGH (ref 26.0–34.0)
MCHC: 28.9 g/dL — ABNORMAL LOW (ref 30.0–36.0)
MCV: 118.3 fL — ABNORMAL HIGH (ref 80.0–100.0)
Monocytes Absolute: 0.9 10*3/uL (ref 0.1–1.0)
Monocytes Relative: 4 %
Neutro Abs: 19.6 10*3/uL — ABNORMAL HIGH (ref 1.7–7.7)
Neutrophils Relative %: 92 %
Platelets: 311 10*3/uL (ref 150–400)
RBC: 2.84 MIL/uL — ABNORMAL LOW (ref 3.87–5.11)
RDW: 20.9 % — ABNORMAL HIGH (ref 11.5–15.5)
WBC: 21.4 10*3/uL — ABNORMAL HIGH (ref 4.0–10.5)
nRBC: 0.4 % — ABNORMAL HIGH (ref 0.0–0.2)

## 2020-05-24 LAB — COOXEMETRY PANEL
Carboxyhemoglobin: 1.5 % (ref 0.5–1.5)
Carboxyhemoglobin: 1.5 % (ref 0.5–1.5)
Carboxyhemoglobin: 1.5 % (ref 0.5–1.5)
Methemoglobin: 1 % (ref 0.0–1.5)
Methemoglobin: 1 % (ref 0.0–1.5)
Methemoglobin: 1 % (ref 0.0–1.5)
O2 Saturation: 70.7 %
O2 Saturation: 72 %
O2 Saturation: 71.7 %
Total hemoglobin: 10.2 g/dL — ABNORMAL LOW (ref 12.0–16.0)
Total hemoglobin: 10.1 g/dL — ABNORMAL LOW (ref 12.0–16.0)
Total hemoglobin: 9.5 g/dL — ABNORMAL LOW (ref 12.0–16.0)

## 2020-05-24 LAB — LACTIC ACID, PLASMA: Lactic Acid, Venous: >11 mmol/L (ref 0.5–1.9)

## 2020-05-24 LAB — PHOSPHORUS: Phosphorus: 7.5 mg/dL — ABNORMAL HIGH (ref 2.5–4.6)

## 2020-05-24 LAB — BASIC METABOLIC PANEL
BUN: 11 mg/dL (ref 6–20)
CO2: <7 mmol/L — ABNORMAL LOW (ref 22–32)
Calcium: 7.9 mg/dL — ABNORMAL LOW (ref 8.9–10.3)
Chloride: 97 mmol/L — ABNORMAL LOW (ref 98–111)
Creatinine, Ser: 1 mg/dL (ref 0.44–1.00)
GFR, Estimated: >60 mL/min (ref >60)
Glucose, Bld: 196 mg/dL — ABNORMAL HIGH (ref 70–99)
Potassium: 6.2 mmol/L — ABNORMAL HIGH (ref 3.5–5.1)
Sodium: 128 mmol/L — ABNORMAL LOW (ref 135–145)

## 2020-05-24 LAB — BRAIN NATRIURETIC PEPTIDE: B Natriuretic Peptide: 1860.2 pg/mL — ABNORMAL HIGH (ref 0.0–100.0)

## 2020-05-24 LAB — MAGNESIUM: Magnesium: 2.1 mg/dL (ref 1.7–2.4)

## 2020-05-24 LAB — SODIUM: Sodium: 127 mmol/L — ABNORMAL LOW (ref 135–145)

## 2020-05-24 LAB — GLUCOSE, CAPILLARY: Glucose-Capillary: 72 mg/dL (ref 70–99)

## 2020-05-24 LAB — TROPONIN I (HIGH SENSITIVITY): Troponin I (High Sensitivity): 114 ng/L (ref <18)

## 2020-05-24 MED ORDER — GLYCOPYRROLATE 0.2 MG/ML IJ SOLN: 0.2000 mg | INTRAMUSCULAR | Status: DC | PRN

## 2020-05-24 MED ORDER — NOREPINEPHRINE 4 MG/250ML-% IV SOLN
0.0000 ug/min | INTRAVENOUS | Status: DC
  Administered 2020-05-24: 10 ug/min via INTRAVENOUS
  Filled 2020-05-24: qty 250

## 2020-05-24 MED ORDER — DIPHENHYDRAMINE HCL 50 MG/ML IJ SOLN: 25.0000 mg | INTRAMUSCULAR | Status: DC | PRN

## 2020-05-24 MED ORDER — PHENYLEPHRINE HCL-NACL 10-0.9 MG/250ML-% IV SOLN
0.0000 ug/min | INTRAVENOUS | Status: DC
  Filled 2020-05-24: qty 250

## 2020-05-24 MED ORDER — CALCIUM GLUCONATE-NACL 1-0.675 GM/50ML-% IV SOLN: 1.0000 g | Freq: Once | INTRAVENOUS | Status: DC

## 2020-05-24 MED ORDER — SODIUM BICARBONATE 8.4 % IV SOLN: 100.0000 meq | Freq: Once | INTRAVENOUS | Status: DC

## 2020-05-24 MED ORDER — STERILE WATER FOR INJECTION IV SOLN
INTRAVENOUS | Status: DC
  Filled 2020-05-24: qty 850

## 2020-05-24 MED ORDER — LORAZEPAM 2 MG/ML IJ SOLN: 2.0000 mg | INTRAMUSCULAR | Status: DC | PRN

## 2020-05-24 MED ORDER — NOREPINEPHRINE 4 MG/250ML-% IV SOLN
0.0000 ug/min | INTRAVENOUS | Status: DC
  Filled 2020-05-24: qty 250

## 2020-05-24 MED ORDER — GLYCOPYRROLATE 1 MG PO TABS
1.0000 mg | ORAL_TABLET | ORAL | Status: DC | PRN
  Filled 2020-05-24: qty 1

## 2020-05-24 MED ORDER — MORPHINE 100MG IN NS 100ML (1MG/ML) PREMIX INFUSION
1.0000 mg/h | INTRAVENOUS | Status: DC
  Administered 2020-05-24: 1 mg/h via INTRAVENOUS
  Filled 2020-05-24 (×2): qty 100

## 2020-05-24 MED ORDER — ACETAMINOPHEN 650 MG RE SUPP: 650.0000 mg | Freq: Four times a day (QID) | RECTAL | Status: DC | PRN

## 2020-05-24 MED ORDER — SODIUM CHLORIDE 0.9 % IV BOLUS: 500.0000 mL | Freq: Once | INTRAVENOUS | Status: DC

## 2020-05-24 MED ORDER — POLYVINYL ALCOHOL 1.4 % OP SOLN
1.0000 [drp] | Freq: Four times a day (QID) | OPHTHALMIC | Status: DC | PRN
  Filled 2020-05-24: qty 15

## 2020-05-24 MED ORDER — ACETAMINOPHEN 325 MG PO TABS: 650.0000 mg | ORAL_TABLET | Freq: Four times a day (QID) | ORAL | Status: DC | PRN

## 2020-05-27 ENCOUNTER — Ambulatory Visit: Payer: Medicare PPO | Admitting: Cardiology

## 2020-05-28 NOTE — Progress Notes (Addendum)
PROGRESS NOTE  Madison Hurst WUJ:[id number] DOB: 1960-05-17 DOA: 05/11/2020 PCP: Buckner Malta, MD  HPI/Recap of past 24 hours: Madison Hurst a 60 y.o.femalewith medical history significant ofR MCA stroke with L sided weakness and aphasia on 1/24, also has RA on prednisone, DM2, HTN, paroxysmal atrial tachycardia and PACs, cervical spinal stenosis. Recently discharged on 05/16/2020 from inpatient rehab at [facility], from there she was discharged to home.  She returns with complaints of inability to urinate for the past 24 hours.  Work-up revealed acute urinary retention for which a Foley catheter was inserted and hyponatremia with presenting serum sodium of 120.  She had a CT abdomen and pelvis with contrast to evaluate her acute urinary retention and was negative for any acute findings.  Due to concern for possible worsening cervical spine stenosis, she had an MRI of her cervical spine done (twice due to abbreviated and motion degraded study) which did not reveal compressive canal or foraminal stenosis.  Hospital course complicated by progressive lethargy worse on 05/23/2020.  She was transferred to progressive.  Work-up revealed elevated troponin with concern for cardiogenic shock and metabolic derangement.  She received flumazenil and Narcan.  CT head and EEG were unremarkable.  She is seen by cardiology, PCCM and neurology.  The evening of 05/23/2020 she was started on milrinone which was stopped on 06-07-2020 due to severe hypotension.  2020-06-07: Received a call from bedside RN on progressive unit regarding patient's blood pressure of 53/33.  Gave order to place the patient in Trendelenburg position, give IV fluid bolus normal saline 500 cc, start stat Levophed drip and promptly came to bedside to evaluate the patient.  Upon arrival the patient was in a Trendelenburg position and obtunded.  After the start of Levophed her blood pressure improved with a MAP of  78.  Cardiology also came to evaluate the patient as well as rapid response team.  Addendum: Patient transferred to ICU, higher level of care, to receive vasopressors and inotropes.    Assessment/Plan: Principal Problem:   Hyponatremia Active Problems:   PAT (paroxysmal atrial tachycardia) (HCC)   Chronic combined systolic and diastolic CHF (congestive heart failure) (HCC)   Rheumatoid arthritis involving multiple sites [facility])   Right middle cerebral artery stroke (HCC)   Benign essential HTN   Diabetes mellitus type 2 in nonobese [facility])   Acute urinary retention   Lesion of pancreas   Pulmonary nodule   Malnutrition of moderate degree   Pressure injury of skin  Persistent toxic acute metabolic encephalopathy, suspect cardiogenic shock Became very lethargic on 05/23/20 AM Work-up revealed cardiogenic shock and metabolic derangement. CT head and EEG unremarkable. Electrolytes are being repleted as indicated.  Cardiogenic shock in the setting of ischemic cardiomyopathy LVEF 20 to 25% from 2D echo done on 04/23/2020. Was started on Marinol on the evening of 05/23/2020.  This was stopped due to severe hypotension in the morning of 07-Jun-2020.  She received a bolus of IV fluid with normal saline 500 cc x 1 and was started on Levophed to maintain her MAP greater than 65.  Severe metabolic acidosis in the setting of cardiogenic shock. Serum bicarb this morning is less than 7, anion gap not calculated. ABG ordered 2 A of bicarb ordered Started on isotonic bicarb drip Repeat BMP at noon  Acute severe hyperkalemia Serum potassium 6.2 Ordered calcium gluconate 1 g x 1 Isotonic bicarb drip Obtain twelve-lead EKG Repeat BMP if no improvement of hyperkalemia, add to treatment  IV insulin and D50.  Worsening transaminitis in the setting of cardiogenic shock Liver chemistries are uptrending Avoid hepatotoxic agents Treat underlying condition Repeat CMP  Ischemic cardiomyopathy with LVEF  20 to 25% BNP greater than 1800 on 05/23/2020. Last 2D echo done on 04/24/2020 showing LVEF 20 to 25%, LV global hypokinesis Repeat 2D echo on 05/07/2020 Strict I's and O's and daily weight  Recent R MCA CVA (04/22/20) On aspirin Hold off statin due to acute transaminitis CT head on 05/23/2020 with no evidence of acute intracranial findings.  Rheumatoid arthritis on chronic steroid Home regimen includes prednisone 5 mg daily Started on IV Solu-Medrol 40 mg daily on 05/23/2020 due to n.p.o.  Hypothyroidism TSH 0.369, normal range, on 05/23/2020. Started on IV levothyroxine on 05/23/2020 due to n.p.o.  Elevated troponin in the setting of ischemic cardiomyopathy, cardiogenic shock  Troponin peaked at 132 Abnormal EKG on 05/23/2020 Seen by cardiology Cardiology recommended no heparin drip for now. Defer management to cardiology.  Improving hypovolemic hyponatremia Presented with serum sodium 120> 119 (05/22/2020) Improved to 128 on 05/06/2020 with IV fluid normal saline. Continue to monitor  Electrolytes derangement Hypocalcemia, replete and recheck. Hyperkalemia, treat and recheck.  History of cervical spinal stenosis MRI of her cervical spine did not reveal compressive canal or foraminal stenosis. Continue to monitor  Leukocytosis, unclear etiology. Possibly reactive in the setting of steroid use from rheumatoid arthritis. Repeat labs on 05/19/2020, pending. UA was negative on admission  QTC prolongation QTC greater than 520 on twelve-lead EKG Avoid QTC prolonging agents Optimize magnesium and potassium levels Repeat twelve-lead EKG on 04/30/2020, showing no improvement.  Chronic anxiety/depression Currently n.p.o., continue to hold off home oral medications. EEG did not reveal any evidence of seizure activity.  GERD Continue PPI intravenously.  Physical debility PT OT to assess once stable. Fall precautions have been in place.  Bilateral lower extremity rash, unclear  etiology, present on admission Pressure wounds, POA Wound care specialist consult Local wound care.    Code Status: DNR.  Family Communication: Updated her husband via phone on 05/23/2020.  Disposition Plan: Undetermined at this time.   Consultants:  Cardiology.  PCCM.  Neurology.  Procedures:  PICC line placement.  Antimicrobials:  None.  DVT prophylaxis: Subcu Lovenox daily  Status is: Inpatient   Dispo:  Patient From: Home  Planned Disposition: Undetermined.  Expected discharge date: 05/28/2020  Medically stable for discharge: No, ongoing management of toxic acute metabolic encephalopathy, cardiogenic shock.          Objective: Vitals:   05/23/20 2330 05/20/2020 0300 05/23/2020 0400 05/01/2020 0446  BP:  91/60    Pulse:  (!) 112 (!) 112   Resp:  20 20   Temp:  97.7 F (36.5 C)    TempSrc:  Oral    SpO2: 98% 98%    Weight:    95.5 kg  Height:        Intake/Output Summary (Last 24 hours) at 05/19/2020 0854 Last data filed at 05/19/2020 0400 Gross per 24 hour  Intake 538 ml  Output 400 ml  Net 138 ml   Filed Weights   05/22/20 1300 05/01/2020 0446  Weight: 94.5 kg 95.5 kg    Exam:  . General: 60 y.o. year-old female chronically ill-appearing, obtunded.  . Cardiovascular: Regular rate and rhythm no rubs or gallops.   Marland Kitchen Respiratory: Clear to auscultation no wheezes or rales.  Poor inspiratory effort.   . Abdomen: Soft nontender nondistended normal bowel sounds present.   Marland Kitchen  Musculoskeletal: Trace lower extremity edema bilaterally. . Skin: Rash involving lower extremities bilaterally.   Marland Kitchen Psychiatry: Unable to assess mood due to obtundation.   Data Reviewed: CBC: Recent Labs  Lab 05/15/2020 2016 05/23/20 1359 05/23/20 1631  WBC 12.9* 13.0* 12.4*  NEUTROABS 11.1* 11.2* 11.8*  HGB 11.3* 11.2* 10.5*  HCT 36.2 36.1 32.4*  MCV 107.4* 108.1* 107.6*  PLT 352 300 304   Basic Metabolic Panel: Recent Labs  Lab 05/23/20 0125 05/23/20 1359  05/23/20 1631 05/23/20 1830 05/23/20 2350 05/12/2020 0412 05/20/2020 0605  NA 124* 126* 128* 124* 127* 128* 128*  K 3.5 4.4 3.4*  --   --  6.2* 6.0*  CL 93* 95* 97*  --   --  97* 96*  CO2 18* 17* 18*  --   --  <7* <7*  GLUCOSE 132* 130* 160*  --   --  196* 188*  BUN 11 11 10   --   --  11 12  CREATININE 0.59 0.62 0.62  --   --  1.00 0.98  CALCIUM 7.7* 7.6* 7.8*  --   --  7.9* 8.0*  MG  --   --  1.8  --   --   --  2.1  PHOS  --   --  1.9*  --   --   --  7.5*   GFR: Estimated Creatinine Clearance: 72.4 mL/min (by C-G formula based on SCr of 0.98 mg/dL). Liver Function Tests: Recent Labs  Lab 05/23/20 1359 05/23/20 1631 05/23/2020 0605  AST 123* 92* 139*  ALT 245* 245* 271*  ALKPHOS 203* 196* 207*  BILITOT 1.8* 1.6* 1.8*  PROT 4.8* 4.6* 4.6*  ALBUMIN 2.1* 2.0* 2.0*   No results for input(s): LIPASE, AMYLASE in the last 168 hours. Recent Labs  Lab 05/23/20 1359  AMMONIA 38*   Coagulation Profile: No results for input(s): INR, PROTIME in the last 168 hours. Cardiac Enzymes: No results for input(s): CKTOTAL, CKMB, CKMBINDEX, TROPONINI in the last 168 hours. BNP (last 3 results) No results for input(s): PROBNP in the last 8760 hours. HbA1C: No results for input(s): HGBA1C in the last 72 hours. CBG: Recent Labs  Lab 05/23/20 0734 05/23/20 1207 05/23/20 1621 05/23/20 2241 05/17/2020 0740  GLUCAP 108* 124* 142* 177* 72   Lipid Profile: No results for input(s): CHOL, HDL, LDLCALC, TRIG, CHOLHDL, LDLDIRECT in the last 72 hours. Thyroid Function Tests: Recent Labs    05/23/20 1830  TSH 0.369   Anemia Panel: Recent Labs    05/23/20 1830  VITAMINB12 2,105*   Urine analysis:    Component Value Date/Time   COLORURINE AMBER (A) 05/14/2020 1715   APPEARANCEUR CLEAR 05/27/2020 1715   LABSPEC 1.018 05/04/2020 1715   PHURINE 5.0 05/25/2020 1715   GLUCOSEU NEGATIVE 05/14/2020 1715   HGBUR NEGATIVE 05/22/2020 1715   BILIRUBINUR NEGATIVE 05/25/2020 1715   KETONESUR  NEGATIVE 05/23/2020 1715   PROTEINUR NEGATIVE 05/25/2020 1715   NITRITE NEGATIVE 05/03/2020 1715   LEUKOCYTESUR NEGATIVE 05/19/2020 1715   Sepsis Labs: @LABRCNTIP (procalcitonin:4,lacticidven:4)  ) Recent Results (from the past 240 hour(s))  Resp Panel by RT-PCR (Flu A&B, Covid) Nasopharyngeal Swab     Status: None   Collection Time: 05/23/2020 10:19 PM   Specimen: Nasopharyngeal Swab; Nasopharyngeal(NP) swabs in vial transport medium  Result Value Ref Range Status   SARS Coronavirus 2 by RT PCR NEGATIVE NEGATIVE Final    Comment: (NOTE) SARS-CoV-2 target nucleic acids are NOT DETECTED.  The SARS-CoV-2 RNA is generally detectable in upper  respiratory specimens during the acute phase of infection. The lowest concentration of SARS-CoV-2 viral copies this assay can detect is 138 copies/mL. A negative result does not preclude SARS-Cov-2 infection and should not be used as the sole basis for treatment or other patient management decisions. A negative result may occur with  improper specimen collection/handling, submission of specimen other than nasopharyngeal swab, presence of viral mutation(s) within the areas targeted by this assay, and inadequate number of viral copies(<138 copies/mL). A negative result must be combined with clinical observations, patient history, and epidemiological information. The expected result is Negative.  Fact Sheet for Patients:  BloggerCourse.com  Fact Sheet for Healthcare Providers:  SeriousBroker.it  This test is no t yet approved or cleared by the [place] FDA and  has been authorized for detection and/or diagnosis of SARS-CoV-2 by FDA under an Emergency Use Authorization (EUA). This EUA will remain  in effect (meaning this test can be used) for the duration of the COVID-19 declaration under Section 564(b)(1) of the Act, 21 U.S.C.section 360bbb-3(b)(1), unless the authorization is terminated  or  revoked sooner.       Influenza A by PCR NEGATIVE NEGATIVE Final   Influenza B by PCR NEGATIVE NEGATIVE Final    Comment: (NOTE) The Xpert Xpress SARS-CoV-2/FLU/RSV plus assay is intended as an aid in the diagnosis of influenza from Nasopharyngeal swab specimens and should not be used as a sole basis for treatment. Nasal washings and aspirates are unacceptable for Xpert Xpress SARS-CoV-2/FLU/RSV testing.  Fact Sheet for Patients: BloggerCourse.com  Fact Sheet for Healthcare Providers: SeriousBroker.it  This test is not yet approved or cleared by the [place] FDA and has been authorized for detection and/or diagnosis of SARS-CoV-2 by FDA under an Emergency Use Authorization (EUA). This EUA will remain in effect (meaning this test can be used) for the duration of the COVID-19 declaration under Section 564(b)(1) of the Act, 21 U.S.C. section 360bbb-3(b)(1), unless the authorization is terminated or revoked.  Performed at [facility], [address], [place], [place] [address]       Studies: CT HEAD WO CONTRAST  Result Date: 05/23/2020 CLINICAL DATA:  Mental status change EXAM: CT HEAD WITHOUT CONTRAST TECHNIQUE: Contiguous axial images were obtained from the base of the skull through the vertex without intravenous contrast. COMPARISON:  CT head 05/07/2020 FINDINGS: Brain: Moderate atrophy. Chronic white matter changes, stable from the prior study. Negative for acute infarct, hemorrhage, mass. Vascular: Negative for hyperdense vessel Skull: Negative Sinuses/Orbits: Paranasal sinuses clear.  No orbital lesion Other: None IMPRESSION: No acute abnormality no interval change from prior study. Atrophy and chronic white matter changes. Electronically Signed   By: Marlan Palau M.D.   On: 05/23/2020 10:42   DG CHEST PORT 1 VIEW  Result Date: 05/23/2020 CLINICAL DATA:  Shortness of breath. EXAM: PORTABLE CHEST 1 VIEW  COMPARISON:  April 22, 2020 FINDINGS: Multiple sternal wires are seen. A left-sided PICC line is present with its distal tip noted at the junction of the superior vena cava and right atrium. The heart size and mediastinal contours are within normal limits. Both lungs are clear. Radiopaque surgical clips are seen overlying the right upper quadrant. A stable left-sided shoulder replacement is seen. IMPRESSION: 1. Interval left-sided PICC line placement and positioning, as described above, when compared to the prior study. 2. Evidence of prior median sternotomy without acute or active cardiopulmonary disease. Electronically Signed   By: Aram Candela M.D.   On: 05/23/2020 20:01  EEG adult  Result Date: 05/23/2020 Charlsie Quest, MD     05/23/2020 12:16 PM Patient Name: Maleny Candy MRN: [id number] Epilepsy Attending: Charlsie Quest Referring Physician/Provider: Dr. Raphael Gibney Date: 05/23/2020 Duration: 24.13 mins Patient history: 60 year old female with altered mental status.  EEG to evaluate for seizures. Level of alertness: Awake, asleep AEDs during EEG study: None Technical aspects: This EEG study was done with scalp electrodes positioned according to the 10-20 International system of electrode placement. Electrical activity was acquired at a sampling rate of  and reviewed with a high frequency filter of  and a low frequency filter of . EEG data were recorded continuously and digitally stored. Description: No posterior dominant rhythm was seen.  Sleep was characterized by sleep spindles (12 to 14 Hz), maximal frontocentral region. EEG showed continuous generalized low amplitude  2-3 Hz delta slowing. Hyperventilation and photic stimulation were not performed.   ABNORMALITY -Continuous slow, generalized IMPRESSION: This study is suggestive of moderate to severe diffuse encephalopathy, nonspecific etiology. No seizures or epileptiform discharges were seen throughout the  recording. Charlsie Quest   [place] EKG SITE RITE  Result Date: 05/23/2020 If [facility] image not attached, placement could not be confirmed due to current cardiac rhythm.   Scheduled Meds: . aspirin EC  81 mg Oral Daily  . Chlorhexidine Gluconate Cloth  6 each Topical Daily  . diclofenac Sodium  2 g Topical QID  . DULoxetine  30 mg Oral Daily  . enoxaparin (LOVENOX) injection  30 mg Subcutaneous Q24H  . feeding supplement  237 mL Oral BID BM  . folic acid  2 mg Oral Daily  . insulin aspart  0-9 Units Subcutaneous TID WC  . [START ON 05/09/2020] levothyroxine  37.5 mcg Intravenous Daily  . melatonin  5 mg Oral QHS  . methylPREDNISolone (SOLU-MEDROL) injection  40 mg Intravenous Daily  . pantoprazole (PROTONIX) IV  40 mg Intravenous Q24H  . sodium bicarbonate  100 mEq Intravenous Once  . sodium chloride flush  10-40 mL Intracatheter Q12H    Continuous Infusions: . calcium gluconate    . morphine 1 mg/hr (2020-06-19 0850)  . norepinephrine (LEVOPHED) Adult infusion    . norepinephrine (LEVOPHED) Adult infusion 15 mcg/min (06/19/2020 0802)  .  sodium bicarbonate (isotonic) infusion in sterile water    . sodium chloride       LOS: 2 days     Darlin Drop, MD [facility] Pager [phone]  If 7PM-7AM, please contact night-coverage [email or web address] Password [facility] 2020/06/19, 8:54 AM

## 2020-05-28 NOTE — Consult Note (Signed)
Palliative Medicine Inpatient Consult Note  Reason for consult:  Goals of Care  HPI:  Per PCCM Note --> 60yo female with extensive PMH including RA on chronic prednisone, DM, HTN, PAF, recent admission 04/22/20 with acute R MCA stroke d/c to [facility] 05/16/20. She returned with decreased UOP found to have urinary retention and foley placed. But further w/u revealed Na 120 thought to be hypovolemic hyponatremia and she was admitted.  On 2/24 developed progressive somnolence and PCCM consulted.  Palliative care was asked to get involved to aid in goals of care conversation.  Clinical Assessment/Goals of Care:  *Please note that this is a verbal dictation therefore any spelling or grammatical errors are due to the "[employer]" system interpretation.  I have reviewed medical records including EPIC notes, labs and imaging, received report from bedside RN, assessed the patient who is exceptionally pale and on NRB mask.    I met with patients spouse, Madison Hurst and brother  to further discuss diagnosis prognosis, GOC, EOL wishes, disposition and options.   I introduced Palliative Medicine as specialized medical care for people living with serious illness. It focuses on providing relief from the symptoms and stress of a serious illness. The goal is to improve quality of life for both the patient and the family.  Madison Hurst lives in [place], [place]. She is married to her husband, Madison Hurst and has two children. Her brother recently relocated from [place] and has been living with her. She is a retired Education officer, museum. She is an avid animal lover and has two dogs in her home. She has a strong belief in God.   Prior to admission [facility] had just transitioned home from [facility] after having aggressive therapy s/p R MCA stroke. She had only been home for a few days prior to coming in for worsening lethargy and falls.  Patients husband, Madison Hurst is her Media planner.    Concepts specific to code status,  artifical feeding and hydration, continued IV antibiotics and rehospitalization was had. Patient is DNAR/DNI.  The difference between a aggressive medical intervention path  and a palliative comfort care path for this patient at this time was had. Patient has been struggling for years now with RA. Her family recognizes this and shares that they do not want her to suffer. She has been initiated on comfort measures. We talked about transition to comfort measures in house and what that would entail inclusive of medications to control pain, dyspnea, agitation, nausea, itching, and hiccups.   We discussed stopping all uneccessary measures such as monitoring, pressors,   blood draws, needle sticks, and frequent vital signs.   Utilized reflective listening throughout our time together. Patient family express disbelief in regards to how quickly she deteriorated. We reviewed her present state, organ dysfunction, and the general prognosis of severe cardiogenic shock, We reviewed her PMH in conjunction with these points to aid in supporting their understanding of this.   Discussed the importance of continued conversation with family and their  medical providers regarding overall plan of care and treatment options, ensuring decisions are within the context of the patients values and GOCs.  Decision Maker: Madison Hurst (Spouse) [phone]  SUMMARY OF RECOMMENDATIONS   DNAR/DNI  Comfort Focused Care  Medications per [facility]  Unrestricted visitor policy  Spiritual support  Ongoing PMT support  Code Status/Advance Care Planning: DNAR/DNI   Palliative Prophylaxis:   Oral Care, Turn Q2H, Music Therapy  Additional Recommendations (Limitations, Scope, Preferences):  Comfort Care   Psycho-social/Spiritual:  Desire for further Chaplaincy support: Yes  Additional Recommendations: Education on end of life process and cardiogenic shock   Prognosis: Poor limited to hours  Discharge Planning:  Discharge will be celetial  Vitals:   06/05/20 0800 June 05, 2020 0900  BP: (!) 83/28 (!) 112/99  Pulse: (!) 28 60  Resp: (!) 27 (!) 31  Temp:    SpO2: 90% (!) 87%    Intake/Output Summary (Last 24 hours) at June 05, 2020 1018 Last data filed at Jun 05, 2020 2979 Gross per 24 hour  Intake 610.56 ml  Output 400 ml  Net 210.56 ml   Last Weight  Most recent update: 05-Jun-2020  4:46 AM   Weight  95.5 kg (210 lb 9.3 oz)           Gen:  Acutely Ill F HEENT: Dry mucous membranes CV: Irregular rate and rhythm  PULM: On NRB FM ABD: soft  EXT: Generalized edema and cyanosis of all digits Neuro: Comatose  PPS: 10%   This conversation/these recommendations were discussed with patient primary care team, Dr. Nevada Crane  Time In: 0900 Time Out: 1010 Total Time: 70 Greater than 50%  of this time was spent counseling and coordinating care related to the above assessment and plan.  [place] Team Team Cell Phone: [phone] Please utilize secure chat with additional questions, if there is no response within 30 minutes please call the above phone number  Palliative Medicine Team providers are available by phone from 7am to 7pm daily and can be reached through the team cell phone.  Should this patient require assistance outside of these hours, please call the patient's attending physician.

## 2020-05-28 NOTE — Progress Notes (Addendum)
Upon report I was told that the whole night pt was not doing well HR 118 BP 80s/50s agonal breathing O2 undetectable. Night shift nurse called rapid response, called Cardiology and [facility]. No new orders except to start Milrinone which had to be stopped because of pt low BP.  0730 Called rapid response, Nurse said that they had been following this pt over night and were under the impression that she is actively dying and no escalation of care needed. I called husband notified him of pt condition and to start making his way to the hospital. 0740 [facility] MD and Cardiology PA at bedside orders to start PT on fluids, Start levo and transfer to [facility] ICU. I called rapid response nurse at bedside. Pt put on nonerebreather. 0800 Pt transferred to [facility] ICU.

## 2020-05-28 NOTE — Significant Event (Signed)
Rapid Response Event Note   Reason for Call :  Patient hypotensive, provider wanting to start Levophed and move to the ICU  Initial Focused Assessment:  She is unresponsive and in mild respiratory distress Upon my arrival she is in trendelenburg and Leovphed gtt has been started at 10 mcg Cardiology and [facility] providers at bedside requesting ICU transfer.   Interventions:  Increased Levophed to 15 mcg  Transported to 2H16   Plan of Care:     Event Summary:   MD Notified: Cardiology SF:KCLE  And Dr Margo Aye at bedside Call Time: 0733 Arrival Time: 0736 End Time: 0845  Marcellina Millin, RN

## 2020-05-28 NOTE — Progress Notes (Addendum)
NAME:  Madison Hurst, MRN:  [id number], DOB:  10-Sep-1960, LOS: 2 ADMISSION DATE:  05/22/2020, CONSULTATION DATE:  05/23/20 REFERRING MD:  Margo Aye [facility] CHIEF COMPLAINT:  AMS   Brief History:  60yo female with extensive PMH including RA on chronic prednisone, DM, HTN, PAF, recent admission 04/22/20 with acute R MCA stroke d/c to [facility] 05/16/20. She returned with decreased UOP found to have urinary retention and foley placed. But further w/u revealed Na 120 thought to be hypovolemic hyponatremia and she was admitted.  On 2/24 developed progressive somnolence and PCCM consulted.   History of Present Illness:  60yo female with extensive PMH including RA on chronic prednisone, DM, HTN, PAF, recent admission 04/22/20 with acute R MCA stroke d/c to [facility] 05/16/20. She returned with decreased UOP found to have urinary retention and foley placed. But further w/u revealed Na 120 thought to be hypovolemic hyponatremia and she was admitted.  On 2/24 developed progressive somnolence and PCCM consulted. Of note she had ativan 2mg  prior to CT abd/pelvis 2/23, given romazicon without improvement  Past Medical History:   has a past medical history of Anemia (02/09/2020), Anxiety (10/28/2019), Depressed left ventricular ejection fraction (03/13/2020), Depressive disorder (10/28/2019), DOE (dyspnea on exertion) (03/13/2020), Hypothyroidism (02/09/2020), Iron deficiency anemia (01/24/2020), Metabolic syndrome (03/13/2020), Multinodular goiter (03/07/2015), PAC (premature atrial contraction) (03/13/2020), PAT (paroxysmal atrial tachycardia) (HCC) (03/13/2020), Pericardial effusion (02/09/2020), Secondary generalized osteoporosis (02/09/2020), Seropositive rheumatoid arthritis (HCC) (02/09/2020), 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  Significant Hospital Events:    Consults:  Neuro 2/24 Cardiology 2/24  Procedures:    Significant  Diagnostic Tests:  CT abd/pelvis 2/22>> 1. No CT evidence for acute intra-abdominal or pelvic abnormality. Negative for bowel obstruction or bowel wall thickening. Moderate stool in the colon. 2. Punctate nonobstructing stones in the lower poles of the kidneys. No hydronephrosis. 3. 13 mm cystic lesion at the tail of the pancreas. EEG 2/24>>> This study is suggestive of moderate to severe diffuse encephalopathy, nonspecific etiology. No seizures or epileptiform discharges were seen throughout the recording. CT head 2/24>> neg acute   Micro Data:  RVP/covid 2/22>>> NEG   Antimicrobials:    Interim History / Subjective:  Seen lying in bed unresponsive on morphine drip Family at bedside  Objective   Blood pressure 91/60, pulse (!) 112, temperature 97.7 F (36.5 C), temperature source Oral, resp. rate 20, height 5\' 5"  (1.651 m), weight 95.5 kg, SpO2 98 %.        Intake/Output Summary (Last 24 hours) at 05/03/2056 0810 Last data filed at 05/01/2020 0400 Gross per 24 hour  Intake 538 ml  Output 400 ml  Net 138 ml   Filed Weights   05/22/20 1300 05/23/2020 0446  Weight: 94.5 kg 95.5 kg    Examination: General: Chronically ill-appearing elderly female lying in bed with comfort measures including morphine drip in place HEENT: [place]/AT, MM pink/moist, PERRL,  Neuro: Unresponsive CV: s1s2 regular rate and rhythm, no murmur, rubs, or gallops,  PULM: Gasping respirations but air hunger, morphine drip titrated GI: soft, bowel sounds active in all 4 quadrants, non-tender, non-distended, Extremities: Cool and pale with ischemic digits in all extremities Skin: no rashes or lesions  Resolved Hospital Problem list     Assessment & Plan:   Toxic acute metabolic encephalopathy Cardiogenic shock  Ischemic cardiomyopathy LVEF 20 to 25% Severe metabolic acidosis  Acute severe hyperkalemia  Worsening transaminates Recent R MCA CVA Hypothyroidism  Elevated troponin  PLAN: Patient was  seen with progressive decline overnight with profound hypotension and worsening altered mental status.  This prompted cardiology consult and decision was made to initiate milrinone for worsening cardiogenic shock.  However this resulted in profound hypotension and milrinone had to be discontinued.  With progressive decline and worsening hypotension cardiology made the decision to transfer patient to ICU for more aggressive care and pressor support.  On arrival to ICU patient was seen actively dying with no further medical options available.  Therefore cardiology initiated morphine drip and call family.  On my assessment patient was displaying signs of air hunger and respiratory distress therefore comfort orders expanded including higher range of morphine infusion and Ativan as needed.  Family is at bedside and patient appears more comfortable.  We will continue to support comfort approach for patient.  Best practice (evaluated daily)  Diet: NPO Pain/Anxiety/Delirium protocol (if indicated): n/a VAP protocol (if indicated): n/a DVT prophylaxis: lovenox GI prophylaxis: ppi Glucose control: SSI Mobility: BR Disposition:ICU  Signature  Delfin Gant, NP-C [facility] Personal contact information can be found on Amion  If no response please page: Adult pulmonary and critical care medicine pager on Amion unitl 7pm After 7pm please call [phone] 05/07/2020, 9:18 AM   PCCM: 60 year old female, cardiogenic shock ischemic cardiomyopathy recent right MCA CVA with progressive decline overnight hypotensive.  Currently remains a DNR.  Please see documented discussion by Raymon Mutton, NP as well as cardiology, hospitalist service Dr. Margo Aye and palliative care team.  Agree with transition of care to comfort measures.  Agree with documentation of Raymon Mutton, NP as documented above.  Appreciate input and transition of care for comfort by palliative care team.  Ischemic  cardiomyopathy, cardiogenic shock Right MCA CVA Suspect in hospital death transition to comfort care measures  Plan: Morphine for comfort Palliative needs and ongoing symptom management per palliative care team. We appreciate the consultation critical care will sign off at this time.  Josephine Igo, DO [facility] 05/21/2020 12:20 PM

## 2020-05-28 NOTE — Progress Notes (Signed)
Nutrition Brief Note  Chart reviewed. Pt now transitioning to comfort care.  No further nutrition interventions warranted at this time.  Please re-consult as needed.   Edword Cu RD, LDN Clinical Nutrition Pager listed in AMION    

## 2020-05-28 NOTE — Progress Notes (Signed)
Progress Note  Patient Name: Madison Hurst Date of Encounter: 05/06/2020  [facility] Cardiologist: Thomasene Ripple, DO   Subjective   Unable to assess  Inpatient Medications    Scheduled Meds: . aspirin EC  81 mg Oral Daily  . Chlorhexidine Gluconate Cloth  6 each Topical Daily  . diclofenac Sodium  2 g Topical QID  . DULoxetine  30 mg Oral Daily  . enoxaparin (LOVENOX) injection  30 mg Subcutaneous Q24H  . feeding supplement  237 mL Oral BID BM  . folic acid  2 mg Oral Daily  . insulin aspart  0-9 Units Subcutaneous TID WC  . [START ON 05/08/2020] levothyroxine  37.5 mcg Intravenous Daily  . melatonin  5 mg Oral QHS  . methylPREDNISolone (SOLU-MEDROL) injection  40 mg Intravenous Daily  . multivitamin with minerals  1 tablet Oral Daily  . pantoprazole (PROTONIX) IV  40 mg Intravenous Q24H  . sodium bicarbonate  100 mEq Intravenous Once  . sodium chloride flush  10-40 mL Intracatheter Q12H   Continuous Infusions: . calcium gluconate    . milrinone 0.25 mcg/kg/min (05/23/20 2209)  . morphine    . norepinephrine (LEVOPHED) Adult infusion    . norepinephrine (LEVOPHED) Adult infusion 15 mcg/min (05/03/2020 0802)  . phenylephrine (NEO-SYNEPHRINE) Adult infusion    .  sodium bicarbonate (isotonic) infusion in sterile water    . sodium chloride     PRN Meds: acetaminophen, ondansetron **OR** ondansetron (ZOFRAN) IV, sodium chloride flush   Vital Signs    Vitals:   05/23/20 2330 05/06/2020 0300 05/04/2020 0400 05/11/2020 0446  BP:  91/60    Pulse:  (!) 112 (!) 112   Resp:  20 20   Temp:  97.7 F (36.5 C)    TempSrc:  Oral    SpO2: 98% 98%    Weight:    95.5 kg  Height:        Intake/Output Summary (Last 24 hours) at 05/06/2020 0841 Last data filed at 05/17/2020 0400 Gross per 24 hour  Intake 538 ml  Output 400 ml  Net 138 ml   Last 3 Weights 05/22/2020 05/22/2020 05/15/2020  Weight (lbs) 210 lb 9.3 oz 208 lb 6.4 oz 102 lb 11.8 oz  Weight (kg) 95.52 kg  94.53 kg 46.6 kg      Telemetry    Sinus tachycardia. - Personally Reviewed  ECG    Sinus tachycardia.  Rate 118 bpm.  - Personally Reviewed  Physical Exam   VS:  BP 91/60 (BP Location: Right Wrist)   Pulse (!) 112   Temp 97.7 F (36.5 C) (Oral)   Resp 20   Ht 5\' 5"  (1.651 m)   Wt 95.5 kg   SpO2 98%   BMI 35.04 kg/m  , BMI Body mass index is 35.04 kg/m. GENERAL:  Critically ill-appearing.  Non-responsive HEENT: Pupils equal round and reactive, fundi not visualized, oral mucosa unremarkable NECK:  + jugular venous distention, waveform within normal limits, carotid upstroke brisk and symmetric, no bruits LUNGS:  Clear to auscultation bilaterally anteriorly HEART:  Tachycardic.  Regular rhythm.   ABD:  Flat, positive bowel sounds normal in frequency in pitch, no bruits, no rebound, no guarding, no midline pulsatile mass, no hepatomegaly, no splenomegaly EXT:  2 plus pulses throughout, no edema, no cyanosis no clubbing SKIN:  No rashes no nodules NEURO:  Unable to assess PSYCH:  Unable to assess   Labs    High Sensitivity Troponin:   Recent Labs  Lab 05/23/20 1631 05/23/20 1830 05/23/20 2350  TROPONINIHS 132* 101* 114*      Chemistry Recent Labs  Lab 05/23/20 1359 05/23/20 1631 05/23/20 1830 05/23/20 2350 05/23/2020 0412 05/16/2020 0605  NA 126* 128*   < > 127* 128* 128*  K 4.4 3.4*  --   --  6.2* 6.0*  CL 95* 97*  --   --  97* 96*  CO2 17* 18*  --   --  <7* <7*  GLUCOSE 130* 160*  --   --  196* 188*  BUN 11 10  --   --  11 12  CREATININE 0.62 0.62  --   --  1.00 0.98  CALCIUM 7.6* 7.8*  --   --  7.9* 8.0*  PROT 4.8* 4.6*  --   --   --  4.6*  ALBUMIN 2.1* 2.0*  --   --   --  2.0*  AST 123* 92*  --   --   --  139*  ALT 245* 245*  --   --   --  271*  ALKPHOS 203* 196*  --   --   --  207*  BILITOT 1.8* 1.6*  --   --   --  1.8*  GFRNONAA >60 >60  --   --  >60 >60  ANIONGAP 14 13  --   --  NOT CALCULATED NOT CALCULATED   < > = values in this interval not  displayed.     Hematology Recent Labs  Lab June 11, 2020 2016 05/23/20 1359 05/23/20 1631  WBC 12.9* 13.0* 12.4*  RBC 3.37* 3.34* 3.01*  HGB 11.3* 11.2* 10.5*  HCT 36.2 36.1 32.4*  MCV 107.4* 108.1* 107.6*  MCH 33.5 33.5 34.9*  MCHC 31.2 31.0 32.4  RDW 21.2* 21.4* 21.4*  PLT 352 300 304    BNP Recent Labs  Lab 05/23/20 2350  BNP 1,860.2*     DDimer No results for input(s): DDIMER in the last 168 hours.   Radiology    CT HEAD WO CONTRAST  Result Date: 05/23/2020 CLINICAL DATA:  Mental status change EXAM: CT HEAD WITHOUT CONTRAST TECHNIQUE: Contiguous axial images were obtained from the base of the skull through the vertex without intravenous contrast. COMPARISON:  CT head 05/07/2020 FINDINGS: Brain: Moderate atrophy. Chronic white matter changes, stable from the prior study. Negative for acute infarct, hemorrhage, mass. Vascular: Negative for hyperdense vessel Skull: Negative Sinuses/Orbits: Paranasal sinuses clear.  No orbital lesion Other: None IMPRESSION: No acute abnormality no interval change from prior study. Atrophy and chronic white matter changes. Electronically Signed   By: Marlan Palau M.D.   On: 05/23/2020 10:42   MR CERVICAL SPINE WO CONTRAST  Result Date: 05/22/2020 CLINICAL DATA:  Acute presentation with inability to urinate. Poor quality study done earlier today secondary to motion degradation. EXAM: MRI CERVICAL SPINE WITHOUT CONTRAST TECHNIQUE: Multiplanar, multisequence MR imaging of the cervical spine was performed. No intravenous contrast was administered. COMPARISON:  Earlier same day.  04/23/2020. FINDINGS: Yet again, the study is markedly degraded by motion. This is the third attempted exam over the last month and it would seem that the patient is not able to tolerate MRI without sedation. Alignment: No malalignment. Vertebrae: No evidence of fracture or primary bone lesion. Cord: Cord detail quite limited. No visible cord compression or primary cord lesion.  Posterior Fossa, vertebral arteries, paraspinal tissues: Negative Disc levels: Degenerative spondylosis from C3-4 through C6-7. Osteophytic narrowing of the canal and foramina without detection of severe stenosis.  IMPRESSION: 1. Abbreviated and motion degraded study. This is the third attempted exam over the last month and it would seem that the patient is not able to tolerate MRI without sedation. 2. Degenerative spondylosis from C3-4 through C6-7. No compressive canal or foraminal stenosis suspected. Detail remains quite poor. Electronically Signed   By: Paulina Fusi M.D.   On: 05/22/2020 16:33   DG CHEST PORT 1 VIEW  Result Date: 05/23/2020 CLINICAL DATA:  Shortness of breath. EXAM: PORTABLE CHEST 1 VIEW COMPARISON:  April 22, 2020 FINDINGS: Multiple sternal wires are seen. A left-sided PICC line is present with its distal tip noted at the junction of the superior vena cava and right atrium. The heart size and mediastinal contours are within normal limits. Both lungs are clear. Radiopaque surgical clips are seen overlying the right upper quadrant. A stable left-sided shoulder replacement is seen. IMPRESSION: 1. Interval left-sided PICC line placement and positioning, as described above, when compared to the prior study. 2. Evidence of prior median sternotomy without acute or active cardiopulmonary disease. Electronically Signed   By: Aram Candela M.D.   On: 05/23/2020 20:01   EEG adult  Result Date: 05/23/2020 Charlsie Quest, MD     05/23/2020 12:16 PM Patient Name: Suriyah Vergara MRN: [id number] Epilepsy Attending: Charlsie Quest Referring Physician/Provider: Dr. Raphael Gibney Date: 05/23/2020 Duration: 24.13 mins Patient history: 60 year old female with altered mental status.  EEG to evaluate for seizures. Level of alertness: Awake, asleep AEDs during EEG study: None Technical aspects: This EEG study was done with scalp electrodes positioned according to the 10-20 International  system of electrode placement. Electrical activity was acquired at a sampling rate of  and reviewed with a high frequency filter of  and a low frequency filter of . EEG data were recorded continuously and digitally stored. Description: No posterior dominant rhythm was seen.  Sleep was characterized by sleep spindles (12 to 14 Hz), maximal frontocentral region. EEG showed continuous generalized low amplitude  2-3 Hz delta slowing. Hyperventilation and photic stimulation were not performed.   ABNORMALITY -Continuous slow, generalized IMPRESSION: This study is suggestive of moderate to severe diffuse encephalopathy, nonspecific etiology. No seizures or epileptiform discharges were seen throughout the recording. Charlsie Quest   [place] EKG SITE RITE  Result Date: 05/23/2020 If [facility] image not attached, placement could not be confirmed due to current cardiac rhythm.   Cardiac Studies   Echo 04/23/20 1. Left ventricular ejection fraction, by estimation, is 20 to 25%. The  left ventricle has severely decreased function. The left ventricle  demonstrates global hypokinesis. Left ventricular diastolic parameters are  indeterminate.  2. Right ventricular systolic function is normal. The right ventricular  size is normal.  3. Left atrial size was moderately dilated.  4. The mitral valve is normal in structure. Mild mitral valve  regurgitation. No evidence of mitral stenosis.  5. The aortic valve is tricuspid. Aortic valve regurgitation is not  visualized. No aortic stenosis is present.  6. The inferior vena cava is normal in size with greater than 50%  respiratory variability, suggesting right atrial pressure of 3 mmHg.   Patient Profile     60 y.o. female with systolic and diastolic heart failure, RA, hypertension, diabetes, medically managed CAD, and recent stroke admitted with cardiogenic shock and profound hyponatremia.  Assessment & Plan    # Cardiogenic shock:  Ms. Barnette  is critically ill.  This am she became hypotensive and tachycardic despite milrinone.  Her  co-ox has improved.  Clinically she is unresponsive and in respiratory distress.  Blood pressure is too low for diuresis.  BNP was 1800.  Her husband is at bedside.  He wishes to pursue comfort measures at this point.  We will continue the Levophed for now to give him some time.  We will initiate a morphine infusion for respiratory distress.  No plans for any additional interventions.  Anticipate that she will not survive the rest of the day.   Total critical care time: 45 minutes. Critical care time was exclusive of separately billable procedures and treating other patients. Critical care was necessary to treat or prevent imminent or life-threatening deterioration. Critical care was time spent personally by me on the following activities: development of treatment plan with patient and/or surrogate as well as nursing, discussions with consultants, evaluation of patient's response to treatment, examination of patient, obtaining history from patient or surrogate, ordering and performing treatments and interventions, ordering and review of laboratory studies, ordering and review of radiographic studies, pulse oximetry and re-evaluation of patient's condition.      For questions or updates, please contact [facility] Please consult [email or web address] for contact info under        Signed, Chilton Si, MD  05/15/2020, 8:41 AM

## 2020-05-28 NOTE — Progress Notes (Signed)
PT Cancellation Note  Patient Details Name: Madison Hurst MRN: [id number] DOB: 02/18/1961   Cancelled Treatment:    Reason Eval/Treat Not Completed: Medical issues which prohibited therapy this morning. Pt with continued soft BP, being transferred to ICU. PT will continue to follow and re-evaluate as appropriate.   Rolm Baptise, PT, DPT   Acute Rehabilitation Department Pager #: [phone]  Gaetana Michaelis 05/04/2020, 8:13 AM

## 2020-05-28 NOTE — Progress Notes (Signed)
This chaplain responded to PMT consult for EOL spiritual care.  The chaplain introduced herself to the Pt. family-brother, spouse and son at the Pt. bedside.    The  Chaplain listened as the Pt. brother participated in story telling. The family traditions, hobbies, pets, and career are lifted up in the quiet, sacred space around the bed.    The family accepted the chaplain's invitation for F/U spiritual care as needed.

## 2020-05-28 NOTE — Progress Notes (Signed)
Called to bedside for BP 53/32 HR 118. Milrinone was stopped due to hypotension. Started levophed for pressure support and husband called to bedside. Have ordered repeat coox with transfer to 2H. Discussed with Dr. Samhita Kretsch Salvia.

## 2020-05-28 NOTE — Progress Notes (Signed)
   05/12/2020 0840  Clinical Encounter Type  Visited With Patient and family together  Visit Type Initial;Spiritual support  Referral From Nurse  Consult/Referral To Chaplain  Spiritual Encounters  Spiritual Needs Prayer;Emotional   Chaplain responded to page. Chaplain engaged in active listening and ministry of presence. Chaplain prayed with Pt's husband and brother at bedside. Chaplain remains available.   This note was prepared by Chaplain Resident, Tacy Learn, MDiv. Chaplain remains available as needed through the on-call pager: [phone].

## 2020-05-28 NOTE — Progress Notes (Signed)
OT Cancellation Note  Patient Details Name: Madison Hurst MRN: [id number] DOB: 03/21/1961   Cancelled Treatment:    Reason Eval/Treat Not Completed: Patient not medically ready (Transfer to 2H ICU. MAP in 30s. Will return as schedule allows.)  Mammie Meras M Johnsie Moscoso Prisha Hiley MSOT, OTR/L Acute Rehab Pager: [phone] Office: [phone] June 19, 2020, 8:59 AM

## 2020-05-28 DEATH — deceased

## 2020-05-29 ENCOUNTER — Encounter: Payer: Medicare PPO | Admitting: Registered Nurse

## 2020-05-29 DIAGNOSIS — I255 Ischemic cardiomyopathy: Secondary | ICD-10-CM | POA: Diagnosis present

## 2020-05-29 DIAGNOSIS — F419 Anxiety disorder, unspecified: Secondary | ICD-10-CM | POA: Diagnosis present

## 2020-05-29 DIAGNOSIS — F32A Depression, unspecified: Secondary | ICD-10-CM | POA: Diagnosis present

## 2020-05-29 DIAGNOSIS — K219 Gastro-esophageal reflux disease without esophagitis: Secondary | ICD-10-CM | POA: Diagnosis present

## 2020-05-29 DIAGNOSIS — Z8673 Personal history of transient ischemic attack (TIA), and cerebral infarction without residual deficits: Secondary | ICD-10-CM

## 2020-05-29 DIAGNOSIS — R778 Other specified abnormalities of plasma proteins: Secondary | ICD-10-CM | POA: Diagnosis present

## 2020-05-29 DIAGNOSIS — R5381 Other malaise: Secondary | ICD-10-CM | POA: Diagnosis present

## 2020-05-29 DIAGNOSIS — G928 Other toxic encephalopathy: Secondary | ICD-10-CM | POA: Diagnosis present

## 2020-05-29 DIAGNOSIS — M4802 Spinal stenosis, cervical region: Secondary | ICD-10-CM | POA: Diagnosis present

## 2020-05-29 DIAGNOSIS — Z7952 Long term (current) use of systemic steroids: Secondary | ICD-10-CM

## 2020-05-29 DIAGNOSIS — E878 Other disorders of electrolyte and fluid balance, not elsewhere classified: Secondary | ICD-10-CM | POA: Diagnosis present

## 2020-05-29 DIAGNOSIS — R570 Cardiogenic shock: Secondary | ICD-10-CM | POA: Diagnosis present

## 2020-05-29 DIAGNOSIS — E872 Acidosis, unspecified: Secondary | ICD-10-CM | POA: Diagnosis present

## 2020-05-29 DIAGNOSIS — M069 Rheumatoid arthritis, unspecified: Secondary | ICD-10-CM | POA: Diagnosis present

## 2020-06-13 ENCOUNTER — Ambulatory Visit: Payer: Medicare PPO | Admitting: Cardiology

## 2020-06-28 NOTE — Discharge Summary (Addendum)
Death Summary  Madison Hurst NFA:[id number] DOB: 02/11/1961 DOA: Jun 17, 2020  PCP: Buckner Malta, MD  Admit date: June 17, 2020 Date of Death: 2020-06-20 Time of Death: 23-Aug-2313 PM Notification: Buckner Malta, MD notified of death of 20-Jun-2020.  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 Recently discharged on 05/16/2020 from inpatient rehab at [facility], from there she was discharged to home.  She returns with complaints of inability to urinate for the past 24 hours.  Initial work-up revealed acute urinary retention for which a Foley catheter was inserted.  Initial work-up also revealed acute hyponatremia with presenting serum sodium of 120.  She had a CT abdomen and pelvis with contrast to evaluate her acute urinary retention and it was negative for any acute intra-abdominal findings.  Due to concern for possible worsening cervical spine stenosis, she had an MRI of her cervical spine done (twice due to abbreviated and motion degraded study) which did not reveal compressive canal or foraminal stenosis.  Hospital course was complicated by progressive lethargy worse on 05/23/2020 AM for which she was transferred to progressive care unit.  She had an abnormal twelve-lead EKG and elevated troponin which prompted cardiology consultation.  Further work-up revealed cardiogenic shock and associated metabolic derangement.  CT head and EEG were unremarkable for any acute findings.  She was seen by cardiology, PCCM and neurology.  The evening of 05/23/2020 she was started on milrinone for her cardiogenic shock which was stopped on 06-20-20 due to severe hypotension.  On 20-Jun-2020 AM she was started on vasopressors to maintain her MAP greater than 65 and  transferred to the ICU.  Seen by cardiology, PCCM, and palliative care team, family made decision for comfort care.  The patient expired on Jun 20, 2020 at 2315 PM.  Final Diagnoses:  1.  Cardiopulmonary arrest 2/2 to cardiogenic shock. -Elevated troponin secondary to cardiogenic shock. -Ischemic cardiomyopathy with LVEF 20 to 25%. -Severe metabolic acidosis in the setting of cardiogenic shock and lactic acidosis. -Toxic acute metabolic encephalopathy secondary to cardiogenic shock. -Worsening transaminitis in the setting of cardiogenic shock. -Electrolytes derangement. -History of cervical spinal stenosis. -Acute urinary retention. -QTC prolongation. -Sinus tachycardia. -Physical debility. -Chronic anxiety/depression. -GERD. -History of R MCA CVA. -Rheumatoid arthritis on chronic use of steroid. -Malnutrition of moderate degree. -Pulmonary nodule within the lingula measuring 6 mm. -Pancreatic lesion, cystic, at the tail of the pancreas measuring 13 mm. -Anterolisthesis 5 mm at L4 on L5 with degenerative changes. -Moderate aortic atherosclerosis. -Punctate nonobstructing stones in the lower poles of the kidneys. -Heterogeneous liver enhancement, question passive congestion. -Pressure wounds, injury of skin, present on admission.     The results of significant diagnostics from this hospitalization (including imaging, microbiology, ancillary and laboratory) are listed below for reference.    Significant Diagnostic Studies: CT HEAD WO CONTRAST  Result Date: 05/23/2020 CLINICAL DATA:  Mental status change EXAM: CT HEAD WITHOUT CONTRAST TECHNIQUE: Contiguous axial images were obtained from the base of the skull through the vertex without intravenous contrast. COMPARISON:  CT head 05/07/2020 FINDINGS: Brain: Moderate atrophy. Chronic white matter changes, stable from the prior study. Negative for acute infarct, hemorrhage, mass. Vascular: Negative for hyperdense vessel Skull: Negative  Sinuses/Orbits: Paranasal sinuses clear.  No orbital lesion Other: None IMPRESSION: No acute abnormality no interval change from prior study. Atrophy and  chronic white matter changes. Electronically Signed   By: Marlan Palau M.D.   On: 05/23/2020 10:42   CT HEAD WO CONTRAST  Result Date: 05/07/2020 CLINICAL DATA:  Suspected head injury EXAM: CT HEAD WITHOUT CONTRAST TECHNIQUE: Contiguous axial images were obtained from the base of the skull through the vertex without intravenous contrast. COMPARISON:  04/22/2020 FINDINGS: Brain: There is atrophy and chronic small vessel disease changes. No acute intracranial abnormality. Specifically, no hemorrhage, hydrocephalus, mass lesion, acute infarction, or significant intracranial injury. Vascular: No hyperdense vessel or unexpected calcification. Skull: No acute calvarial abnormality. Sinuses/Orbits: Visualized paranasal sinuses and mastoids clear. Orbital soft tissues unremarkable. Other: None IMPRESSION: Atrophy, chronic microvascular disease. No acute intracranial abnormality. Electronically Signed   By: Charlett Nose M.D.   On: 05/07/2020 20:01   MR CERVICAL SPINE WO CONTRAST  Result Date: 05/22/2020 CLINICAL DATA:  Acute presentation with inability to urinate. Poor quality study done earlier today secondary to motion degradation. EXAM: MRI CERVICAL SPINE WITHOUT CONTRAST TECHNIQUE: Multiplanar, multisequence MR imaging of the cervical spine was performed. No intravenous contrast was administered. COMPARISON:  Earlier same day.  04/23/2020. FINDINGS: Yet again, the study is markedly degraded by motion. This is the third attempted exam over the last month and it would seem that the patient is not able to tolerate MRI without sedation. Alignment: No malalignment. Vertebrae: No evidence of fracture or primary bone lesion. Cord: Cord detail quite limited. No visible cord compression or primary cord lesion. Posterior Fossa, vertebral arteries, paraspinal tissues:  Negative Disc levels: Degenerative spondylosis from C3-4 through C6-7. Osteophytic narrowing of the canal and foramina without detection of severe stenosis. IMPRESSION: 1. Abbreviated and motion degraded study. This is the third attempted exam over the last month and it would seem that the patient is not able to tolerate MRI without sedation. 2. Degenerative spondylosis from C3-4 through C6-7. No compressive canal or foraminal stenosis suspected. Detail remains quite poor. Electronically Signed   By: Paulina Fusi M.D.   On: 05/22/2020 16:33   MR CERVICAL SPINE WO CONTRAST  Result Date: 05/22/2020 CLINICAL DATA:  60 year old female unable to have bowel movement or urinate. Status post cervical spine MRI last month which was motion degraded. EXAM: MRI CERVICAL SPINE WITHOUT CONTRAST TECHNIQUE: Multiplanar, multisequence MR imaging of the cervical spine was performed. No intravenous contrast was administered. COMPARISON:  Cervical spine MRI 04/23/2020. FINDINGS: Severely motion degraded exam consisting of only sagittal images today. The exam is largely nondiagnostic. Sagittal T1 and T2 images suggest stable patency of the cervical spinal canal, without high-grade cervical spinal stenosis. Similar patency of the posterior fossa basilar cisterns is noted. There is no diagnostic imaging detail of the spinal cord today due to motion. IMPRESSION: Largely nondiagnostic exam today due to motion. Given the degree of excessive motion, the cervical spine would probably be better re-characterized by noncontrast CT unless this patient can be premedicated/sedated for MRI. Electronically Signed   By: Odessa Fleming M.D.   On: 05/22/2020 04:37   CT ABDOMEN PELVIS W CONTRAST  Result Date: 05/25/2020 CLINICAL DATA:  Unable to have bowel movement or urinate EXAM: CT ABDOMEN AND PELVIS WITH CONTRAST TECHNIQUE: Multidetector CT imaging of the abdomen and pelvis was performed using the standard protocol following bolus administration of  intravenous contrast. CONTRAST:  OMNIPAQUE IOHEXOL 300 MG/ML  SOLN COMPARISON:  CT 01/26/2015, cardiac CT 04/11/2020 FINDINGS: Lower chest: Lung bases demonstrate scarring or atelectasis at the posterior bases. No acute consolidation. Irregular density within the lingula  measuring 6 mm average diameter, series 5, image number 15. Borderline to mild cardiomegaly. Hepatobiliary: Heterogenous liver enhancement without focal lesion, question passive congestion. Status post cholecystectomy. No biliary dilatation. Pancreas: No inflammatory change. 13 mm cystic lesion at the tail of the pancreas, series 3, image number 21. Spleen: Normal in size without focal abnormality. Adrenals/Urinary Tract: Adrenal glands are within normal limits. Small cysts in the kidneys. No hydronephrosis. Punctate stones within the lower poles. Foley catheter and air in the bladder which appears slightly thick-walled. Stomach/Bowel: Stomach nonenlarged. No dilated small bowel. No acute bowel wall thickening. Negative appendix. Moderate stool in the colon. Vascular/Lymphatic: Moderate aortic atherosclerosis. No aneurysm. No suspicious nodes Reproductive: Uterus and bilateral adnexa are unremarkable. Other: Negative for free air or free fluid. Musculoskeletal: 5 mm anterolisthesis L4 on L5 with degenerative changes. Heterogenous mineralization as before. Advanced degenerative changes T11 T12. IMPRESSION: 1. No CT evidence for acute intra-abdominal or pelvic abnormality. Negative for bowel obstruction or bowel wall thickening. Moderate stool in the colon. 2. Punctate nonobstructing stones in the lower poles of the kidneys. No hydronephrosis. 3. 13 mm cystic lesion at the tail of the pancreas. Recommend follow up pre and post contrast MRI/MRCP or pancreatic protocol CT in 1 year. This recommendation follows ACR consensus guidelines: Management of Incidental Pancreatic Cysts: A White Paper of the ACR Incidental Findings Committee. J Am Coll  Radiol 2017;14:911-923. 4. 6 mm irregular density/possible not in the lingula. Non-contrast chest CT at 6-12 months is recommended. If the nodule is stable at time of repeat CT, then future CT at 18-24 months (from today's scan) is considered optional for low-risk patients, but is recommended for high-risk patients. This recommendation follows the consensus statement: Guidelines for Management of Incidental Pulmonary Nodules Detected on CT Images: From the Fleischner Society 2017; Radiology 2017; 284:228-243. 5. Heterogenous liver enhancement, question passive congestion. Aortic Atherosclerosis (ICD10-I70.0). Electronically Signed   By: Jasmine Pang M.D.   On: 05/01/2020 21:23   DG CHEST PORT 1 VIEW  Result Date: 05/23/2020 CLINICAL DATA:  Shortness of breath. EXAM: PORTABLE CHEST 1 VIEW COMPARISON:  April 22, 2020 FINDINGS: Multiple sternal wires are seen. A left-sided PICC line is present with its distal tip noted at the junction of the superior vena cava and right atrium. The heart size and mediastinal contours are within normal limits. Both lungs are clear. Radiopaque surgical clips are seen overlying the right upper quadrant. A stable left-sided shoulder replacement is seen. IMPRESSION: 1. Interval left-sided PICC line placement and positioning, as described above, when compared to the prior study. 2. Evidence of prior median sternotomy without acute or active cardiopulmonary disease. Electronically Signed   By: Aram Candela M.D.   On: 05/23/2020 20:01   DG Knee Right Port  Result Date: 05/15/2020 CLINICAL DATA:  Fall with right knee. EXAM: PORTABLE RIGHT KNEE - 1-2 VIEW COMPARISON:  None. FINDINGS: No fracture. No joint effusion. No dislocation. No suspicious focal osseous lesions. No significant arthropathy. Vascular calcifications in the posterior soft tissues. No radiopaque foreign bodies. IMPRESSION: No right knee fracture, joint effusion or malalignment. Electronically Signed   By: Delbert Phenix M.D.   On: 05/15/2020 14:19   EEG adult  Result Date: 05/23/2020 Charlsie Quest, MD     05/23/2020 12:16 PM Patient Name: Madison Hurst MRN: [id number] Epilepsy Attending: Charlsie Quest Referring Physician/Provider: Dr. Raphael Gibney Date: 05/23/2020 Duration: 24.13 mins Patient history: 60 year old female with altered mental status.  EEG to evaluate for seizures.  Level of alertness: Awake, asleep AEDs during EEG study: None Technical aspects: This EEG study was done with scalp electrodes positioned according to the 10-20 International system of electrode placement. Electrical activity was acquired at a sampling rate of 500Hz  and reviewed with a high frequency filter of 70Hz  and a low frequency filter of 1Hz . EEG data were recorded continuously and digitally stored. Description: No posterior dominant rhythm was seen.  Sleep was characterized by sleep spindles (12 to 14 Hz), maximal frontocentral region. EEG showed continuous generalized low amplitude  2-3 Hz delta slowing. Hyperventilation and photic stimulation were not performed.   ABNORMALITY -Continuous slow, generalized IMPRESSION: This study is suggestive of moderate to severe diffuse encephalopathy, nonspecific etiology. No seizures or epileptiform discharges were seen throughout the recording. Priyanka Annabelle Harman   DG HIP UNILAT WITH PELVIS 2-3 VIEWS RIGHT  Result Date: 05/15/2020 CLINICAL DATA:  Fall in bathroom with right hip pain EXAM: DG HIP (WITH OR WITHOUT PELVIS) 2-3V RIGHT COMPARISON:  None. FINDINGS: No pelvic fracture or diastasis. No right hip fracture or dislocation. No focal osseous lesions. No significant right hip arthropathy. Mild degenerative changes in the visualized lower lumbar spine. No radiopaque foreign bodies. IMPRESSION: No fracture or malalignment in the right hip. Electronically Signed   By: Delbert Phenix M.D.   On: 05/15/2020 14:18   [place] EKG SITE RITE  Result Date: 05/23/2020 If [facility] image not  attached, placement could not be confirmed due to current cardiac rhythm.   Microbiology: Recent Results (from the past 240 hour(s))  Resp Panel by RT-PCR (Flu A&B, Covid) Nasopharyngeal Swab     Status: None   Collection Time: 05/19/2020 10:19 PM   Specimen: Nasopharyngeal Swab; Nasopharyngeal(NP) swabs in vial transport medium  Result Value Ref Range Status   SARS Coronavirus 2 by RT PCR NEGATIVE NEGATIVE Final    Comment: (NOTE) SARS-CoV-2 target nucleic acids are NOT DETECTED.  The SARS-CoV-2 RNA is generally detectable in upper respiratory specimens during the acute phase of infection. The lowest concentration of SARS-CoV-2 viral copies this assay can detect is 138 copies/mL. A negative result does not preclude SARS-Cov-2 infection and should not be used as the sole basis for treatment or other patient management decisions. A negative result may occur with  improper specimen collection/handling, submission of specimen other than nasopharyngeal swab, presence of viral mutation(s) within the areas targeted by this assay, and inadequate number of viral copies(<138 copies/mL). A negative result must be combined with clinical observations, patient history, and epidemiological information. The expected result is Negative.  Fact Sheet for Patients:  BloggerCourse.com  Fact Sheet for Healthcare Providers:  SeriousBroker.it  This test is no t yet approved or cleared by the [place] FDA and  has been authorized for detection and/or diagnosis of SARS-CoV-2 by FDA under an Emergency Use Authorization (EUA). This EUA will remain  in effect (meaning this test can be used) for the duration of the COVID-19 declaration under Section 564(b)(1) of the Act, 21 U.S.C.section 360bbb-3(b)(1), unless the authorization is terminated  or revoked sooner.       Influenza A by PCR NEGATIVE NEGATIVE Final   Influenza B by PCR NEGATIVE NEGATIVE  Final    Comment: (NOTE) The Xpert Xpress SARS-CoV-2/FLU/RSV plus assay is intended as an aid in the diagnosis of influenza from Nasopharyngeal swab specimens and should not be used as a sole basis for treatment. Nasal washings and aspirates are unacceptable for Xpert Xpress SARS-CoV-2/FLU/RSV testing.  Fact Sheet for Patients: BloggerCourse.com  Fact Sheet for Healthcare Providers: SeriousBroker.it  This test is not yet approved or cleared by the [place] FDA and has been authorized for detection and/or diagnosis of SARS-CoV-2 by FDA under an Emergency Use Authorization (EUA). This EUA will remain in effect (meaning this test can be used) for the duration of the COVID-19 declaration under Section 564(b)(1) of the Act, 21 U.S.C. section 360bbb-3(b)(1), unless the authorization is terminated or revoked.  Performed at [facility], [address], [place], [place] [address]      Labs: Basic Metabolic Panel: Recent Labs  Lab 05/23/20 0125 05/23/20 1359 05/23/20 1631 05/23/20 1830 05/23/20 2350 05/16/2020 0412 05/17/2020 0605  NA 124* 126* 128* 124* 127* 128* 128*  K 3.5 4.4 3.4*  --   --  6.2* 6.0*  CL 93* 95* 97*  --   --  97* 96*  CO2 18* 17* 18*  --   --  <7* <7*  GLUCOSE 132* 130* 160*  --   --  196* 188*  BUN --   --  11 12  CREATININE 0.59 0.62 0.62  --   --  1.00 0.98  CALCIUM 7.7* 7.6* 7.8*  --   --  7.9* 8.0*  MG  --   --  1.8  --   --   --  2.1  PHOS  --   --  1.9*  --   --   --  7.5*   Liver Function Tests: Recent Labs  Lab 05/23/20 1359 05/23/20 1631 05/27/2020 0605  AST 123* 92* 139*  ALT 245* 245* 271*  ALKPHOS 203* 196* 207*  BILITOT 1.8* 1.6* 1.8*  PROT 4.8* 4.6* 4.6*  ALBUMIN 2.1* 2.0* 2.0*   No results for input(s): LIPASE, AMYLASE in the last 168 hours. Recent Labs  Lab 05/23/20 1359  AMMONIA 38*   CBC: Recent Labs  Lab 05/23/20 1359 05/23/20 1631 05/14/2020 0800   WBC 13.0* 12.4* 21.4*  NEUTROABS 11.2* 11.8* 19.6*  HGB 11.2* 10.5* 9.7*  HCT 36.1 32.4* 33.6*  MCV 108.1* 107.6* 118.3*  PLT 300 304 311   Cardiac Enzymes: No results for input(s): CKTOTAL, CKMB, CKMBINDEX, TROPONINI in the last 168 hours. D-Dimer No results for input(s): DDIMER in the last 72 hours. BNP: Invalid input(s): POCBNP CBG: Recent Labs  Lab 05/23/20 0734 05/23/20 1207 05/23/20 1621 05/23/20 2241 05/01/2020 0740  GLUCAP 108* 124* 142* 177* 72   Anemia work up No results for input(s): VITAMINB12, FOLATE, FERRITIN, TIBC, IRON, RETICCTPCT in the last 72 hours. Urinalysis    Component Value Date/Time   COLORURINE AMBER (A) 05/11/2020 1715   APPEARANCEUR CLEAR 05/11/2020 1715   LABSPEC 1.018 05/18/2020 1715   PHURINE 5.0 04/30/2020 1715   GLUCOSEU NEGATIVE 05/25/2020 1715   HGBUR NEGATIVE 05/20/2020 1715   BILIRUBINUR NEGATIVE 05/17/2020 1715   KETONESUR NEGATIVE 05/07/2020 1715   PROTEINUR NEGATIVE 05/10/2020 1715   NITRITE NEGATIVE  1715   LEUKOCYTESUR NEGATIVE 05/23/2020 1715   Sepsis Labs Invalid input(s): PROCALCITONIN,  WBC,  LACTICIDVEN     SIGNED:  Darlin Drop, MD  [facility] 05/29/2020, 6:33 PM Pager   If 7PM-7AM, please contact night-coverage [email or web address] Password [email or web address]

## 2022-05-19 ENCOUNTER — Other Ambulatory Visit (HOSPITAL_COMMUNITY): Payer: Self-pay

## 2022-12-09 ENCOUNTER — Other Ambulatory Visit (HOSPITAL_COMMUNITY): Payer: Self-pay
